# Patient Record
Sex: Male | Born: 1965 | Race: White | Hispanic: No | Marital: Single | Smoking: Current every day smoker
Health system: Southern US, Community
[De-identification: ages and names within clinical notes are randomized; demographics above are authoritative.]

## PROBLEM LIST (undated history)

## (undated) DIAGNOSIS — M1990 Unspecified osteoarthritis, unspecified site: Secondary | ICD-10-CM

## (undated) DIAGNOSIS — E46 Unspecified protein-calorie malnutrition: Secondary | ICD-10-CM

## (undated) DIAGNOSIS — G8929 Other chronic pain: Secondary | ICD-10-CM

## (undated) DIAGNOSIS — G629 Polyneuropathy, unspecified: Secondary | ICD-10-CM

## (undated) DIAGNOSIS — Z91148 Patient's other noncompliance with medication regimen for other reason: Secondary | ICD-10-CM

## (undated) DIAGNOSIS — R52 Pain, unspecified: Secondary | ICD-10-CM

## (undated) DIAGNOSIS — E079 Disorder of thyroid, unspecified: Secondary | ICD-10-CM

## (undated) DIAGNOSIS — E1110 Type 2 diabetes mellitus with ketoacidosis without coma: Secondary | ICD-10-CM

## (undated) DIAGNOSIS — Z9114 Patient's other noncompliance with medication regimen: Secondary | ICD-10-CM

---

## 2004-02-17 ENCOUNTER — Ambulatory Visit: Payer: Self-pay | Admitting: Orthopedic Surgery

## 2004-02-25 ENCOUNTER — Ambulatory Visit (HOSPITAL_COMMUNITY): Admission: RE | Admit: 2004-02-25 | Discharge: 2004-02-25 | Payer: Self-pay | Admitting: Orthopedic Surgery

## 2007-10-10 ENCOUNTER — Ambulatory Visit (HOSPITAL_COMMUNITY): Admission: RE | Admit: 2007-10-10 | Discharge: 2007-10-10 | Payer: Self-pay | Admitting: Family Medicine

## 2010-04-11 ENCOUNTER — Encounter: Payer: Self-pay | Admitting: Orthopedic Surgery

## 2011-03-16 ENCOUNTER — Encounter: Payer: Self-pay | Admitting: *Deleted

## 2011-03-16 ENCOUNTER — Emergency Department (HOSPITAL_COMMUNITY)
Admission: EM | Admit: 2011-03-16 | Discharge: 2011-03-16 | Disposition: A | Discharge status: Home / Self Care | Payer: Self-pay | Attending: Emergency Medicine | Admitting: Emergency Medicine

## 2011-03-16 ENCOUNTER — Emergency Department (HOSPITAL_COMMUNITY): Payer: Self-pay

## 2011-03-16 DIAGNOSIS — Z800 Family history of malignant neoplasm of digestive organs: Secondary | ICD-10-CM | POA: Insufficient documentation

## 2011-03-16 DIAGNOSIS — E860 Dehydration: Secondary | ICD-10-CM | POA: Insufficient documentation

## 2011-03-16 DIAGNOSIS — Z801 Family history of malignant neoplasm of trachea, bronchus and lung: Secondary | ICD-10-CM | POA: Insufficient documentation

## 2011-03-16 DIAGNOSIS — K5900 Constipation, unspecified: Secondary | ICD-10-CM | POA: Insufficient documentation

## 2011-03-16 DIAGNOSIS — R739 Hyperglycemia, unspecified: Secondary | ICD-10-CM

## 2011-03-16 DIAGNOSIS — R05 Cough: Secondary | ICD-10-CM | POA: Insufficient documentation

## 2011-03-16 DIAGNOSIS — R059 Cough, unspecified: Secondary | ICD-10-CM | POA: Insufficient documentation

## 2011-03-16 DIAGNOSIS — E119 Type 2 diabetes mellitus without complications: Secondary | ICD-10-CM | POA: Insufficient documentation

## 2011-03-16 DIAGNOSIS — H539 Unspecified visual disturbance: Secondary | ICD-10-CM | POA: Insufficient documentation

## 2011-03-16 DIAGNOSIS — R209 Unspecified disturbances of skin sensation: Secondary | ICD-10-CM | POA: Insufficient documentation

## 2011-03-16 DIAGNOSIS — K649 Unspecified hemorrhoids: Secondary | ICD-10-CM | POA: Insufficient documentation

## 2011-03-16 DIAGNOSIS — F17200 Nicotine dependence, unspecified, uncomplicated: Secondary | ICD-10-CM | POA: Insufficient documentation

## 2011-03-16 HISTORY — DX: Unspecified osteoarthritis, unspecified site: M19.90

## 2011-03-16 LAB — COMPREHENSIVE METABOLIC PANEL
ALT: 8 U/L (ref 0–53)
AST: 10 U/L (ref 0–37)
Alkaline Phosphatase: 56 U/L (ref 39–117)
BUN: 16 mg/dL (ref 6–23)
CO2: 27 mEq/L (ref 19–32)
Chloride: 101 mEq/L (ref 96–112)
GFR calc Af Amer: >90 mL/min (ref >90)
GFR calc non Af Amer: 88 mL/min — ABNORMAL LOW (ref >90)
Glucose, Bld: 281 mg/dL — ABNORMAL HIGH (ref 70–99)
Potassium: 3.8 mEq/L (ref 3.5–5.1)
Sodium: 138 mEq/L (ref 135–145)
Total Bilirubin: 0.3 mg/dL (ref 0.3–1.2)

## 2011-03-16 LAB — URINE MICROSCOPIC-ADD ON

## 2011-03-16 LAB — CBC
HCT: 45.6 % (ref 39.0–52.0)
Hemoglobin: 15.8 g/dL (ref 13.0–17.0)
MCH: 31.2 pg (ref 26.0–34.0)
MCHC: 34.6 g/dL (ref 30.0–36.0)
MCV: 89.9 fL (ref 78.0–100.0)
Platelets: 163 10*3/uL (ref 150–400)
RBC: 5.07 MIL/uL (ref 4.22–5.81)
RDW: 12.4 % (ref 11.5–15.5)
WBC: 10.2 10*3/uL (ref 4.0–10.5)

## 2011-03-16 LAB — URINALYSIS, ROUTINE W REFLEX MICROSCOPIC
Bilirubin Urine: NEGATIVE
Leukocytes, UA: NEGATIVE
Nitrite: NEGATIVE
Specific Gravity, Urine: >1.03 — ABNORMAL HIGH (ref 1.005–1.030)
Urobilinogen, UA: 0.2 mg/dL (ref 0.0–1.0)
pH: 6 (ref 5.0–8.0)

## 2011-03-16 LAB — DIFFERENTIAL
Basophils Absolute: 0 10*3/uL (ref 0.0–0.1)
Basophils Relative: 0 % (ref 0–1)
Lymphocytes Relative: 26 % (ref 12–46)
Lymphs Abs: 2.6 10*3/uL (ref 0.7–4.0)
Monocytes Absolute: 1.1 10*3/uL — ABNORMAL HIGH (ref 0.1–1.0)
Monocytes Relative: 11 % (ref 3–12)
Neutro Abs: 6 10*3/uL (ref 1.7–7.7)
Neutrophils Relative %: 59 % (ref 43–77)

## 2011-03-16 LAB — GLUCOSE, CAPILLARY: Glucose-Capillary: 231 mg/dL — ABNORMAL HIGH (ref 70–99)

## 2011-03-16 MED ORDER — HYDROCORTISONE ACETATE 25 MG RE SUPP: 25.0000 mg | Freq: Two times a day (BID) | RECTAL | Status: AC

## 2011-03-16 MED ORDER — SODIUM CHLORIDE 0.9 % IV SOLN
INTRAVENOUS | Status: DC
  Administered 2011-03-16: 22:00:00 via INTRAVENOUS

## 2011-03-16 MED ORDER — SODIUM CHLORIDE 0.9 % IV BOLUS (SEPSIS)
1000.0000 mL | Freq: Once | INTRAVENOUS | Status: AC
  Administered 2011-03-16: 1000 mL via INTRAVENOUS

## 2011-03-16 NOTE — ED Notes (Signed)
BGL 231

## 2011-03-16 NOTE — ED Notes (Signed)
Pt reports a numbness to the left ring & pinky finger. States blood sugars have been 300+ twice today. Pt took meds 1 hour ago.

## 2011-03-16 NOTE — ED Provider Notes (Signed)
History   This chart was scribed for Ward Givens, MD scribed by Magnus Sinning. The patient was seen in room APA08/APA08    CSN: [id number]  Arrival date & time 03/16/11  2010   First MD Initiated Contact with Patient 03/16/11 2054      Chief Complaint  Patient presents with  . Hyperglycemia    (Consider location/radiation/quality/duration/timing/severity/associated sxs/prior treatment) HPI Kenneth [name] is a 45 y.o. male who presents to the Emergency Department complaining of hyperglycemia, onset a couple days ago, with associated intermittent numbness, onset 11:00 AM today. Pt also reports associated blurred vision, and a "shooting pain from his neck to back." Pt states that numbness is currently resolved, but that his blood sugar has fluctuated between 232 and 356. He furthers that over the last couple of days it has been high and that at baseline it is typically between 100-140.  He says that typically his hyperglycemia is relieved with medications, but that it was not improved with meds today. Pt states that he takes glucophage for DM. He attributes recent hyperglycemia to decreased exercise resulting from lack of work.  Pt denies any headache, and n/v, but does report that he has been experiencing some constipation, beginning 7 days ago, rectal pain following his last BM a few days ago, slight trace of bleeding, and frequent urination that has occurred over the last month without nocturia. PT states "I just don't feel right".  He has also had a cough which is slightly worse then his smokers cough. Pt states his been diabetic for about 2 years  Hx of hemmorhoids  . PCP: [facility]  Past Medical History  Diagnosis Date  . Diabetes mellitus   . Arthritis     History reviewed. No pertinent past surgical history.  No family history on file. Family history of DM  In FOP and Sister and CA in both mother (metastatic lung cancer) and FOP (pancreatic  cancer).  History  Substance Use Topics  . Smoking status: Current Some Day Smoker  . Smokeless tobacco: Not on file  . Alcohol Use:   Current Smoker Drank about 5 shots of brandy last night [employer] investment [employer]    Review of Systems  Constitutional:       High blood sugar  Eyes: Positive for visual disturbance (Blurry vision).  Musculoskeletal:       Shooting pain from neck to back  Neurological: Positive for numbness.  All other systems reviewed and are negative.    Allergies  Review of patient's allergies indicates no known allergies.  Home Medications   Previous Medications   IBUPROFEN (ADVIL,MOTRIN) 200 MG TABLET    Take 200 mg by mouth as needed. For pain    METFORMIN (GLUCOPHAGE) 1000 MG TABLET    Take 1,000 mg by mouth 2 (two) times daily with a meal.        BP 120/70  Pulse 75  Temp 98.3 F (36.8 C)  Resp 20  Ht 5\' 9"  (1.753 m)  Wt 168 lb (76.204 kg)  BMI 24.81 kg/m2  SpO2 97%  Vital signs normal    Physical Exam  Nursing note and vitals reviewed. Constitutional: He is oriented to person, place, and time. He appears well-developed and well-nourished. No distress.  HENT:  Head: Normocephalic and atraumatic.  Eyes: EOM are normal. Pupils are equal, round, and reactive to light.  Neck: Neck supple. No tracheal deviation present.  Cardiovascular: Normal rate, regular rhythm and normal heart sounds.  Pulmonary/Chest: Effort normal. No respiratory distress.  Abdominal: Soft. Bowel sounds are normal. He exhibits no distension.  Genitourinary:       Chaperone present. Physician checked rectum for hemorrhoids. Physician notes one hemorrhoid from 12 o'clock to 6 o'clock and it is not inflamed or bleeding.    Musculoskeletal: Normal range of motion. He exhibits no edema.  Neurological: He is alert and oriented to person, place, and time. No sensory deficit.  Skin: Skin is warm and dry.  Psychiatric: He has a normal mood and affect. His behavior is normal.     ED Course  Procedures (including critical care time) DIAGNOSTIC STUDIES: Oxygen Saturation is 97% on room air, normal by my interpretation.    COORDINATION OF CARE: 10:35 PM-Physician reviewed and explained imaging and lab results with patient and answered questions. Patient reports he is feeling better.  Pt given IV fluids and he feels better.   Results for orders placed during the hospital encounter of 03/16/11  GLUCOSE, CAPILLARY      Component Value Range   Glucose-Capillary 231 (*) 70 - 99 (mg/dL)  COMPREHENSIVE METABOLIC PANEL      Component Value Range   Sodium 138  135 - 145 (mEq/L)   Potassium 3.8  3.5 - 5.1 (mEq/L)   Chloride 101  96 - 112 (mEq/L)   CO2 27  19 - 32 (mEq/L)   Glucose, Bld 281 (*) 70 - 99 (mg/dL)   BUN 16  6 - 23 (mg/dL)   Creatinine, Ser 1.61  0.50 - 1.35 (mg/dL)   Calcium 9.4  8.4 - 09.6 (mg/dL)   Total Protein 6.6  6.0 - 8.3 (g/dL)   Albumin 3.7  3.5 - 5.2 (g/dL)   AST 10  0 - 37 (U/L)   ALT 8  0 - 53 (U/L)   Alkaline Phosphatase 56  39 - 117 (U/L)   Total Bilirubin 0.3  0.3 - 1.2 (mg/dL)   GFR calc non Af Amer 88 (*) >90 (mL/min)   GFR calc Af Amer >90  >90 (mL/min)  CBC      Component Value Range   WBC 10.2  4.0 - 10.5 (K/uL)   RBC 5.07  4.22 - 5.81 (MIL/uL)   Hemoglobin 15.8  13.0 - 17.0 (g/dL)   HCT 04.5  40.9 - 81.1 (%)   MCV 89.9  78.0 - 100.0 (fL)   MCH 31.2  26.0 - 34.0 (pg)   MCHC 34.6  30.0 - 36.0 (g/dL)   RDW 91.4  78.2 - 95.6 (%)   Platelets 163  150 - 400 (K/uL)  DIFFERENTIAL      Component Value Range   Neutrophils Relative 59  43 - 77 (%)   Neutro Abs 6.0  1.7 - 7.7 (K/uL)   Lymphocytes Relative 26  12 - 46 (%)   Lymphs Abs 2.6  0.7 - 4.0 (K/uL)   Monocytes Relative 11  3 - 12 (%)   Monocytes Absolute 1.1 (*) 0.1 - 1.0 (K/uL)   Eosinophils Relative 5  0 - 5 (%)   Eosinophils Absolute 0.5  0.0 - 0.7 (K/uL)   Basophils Relative 0  0 - 1 (%)   Basophils Absolute 0.0  0.0 - 0.1 (K/uL)  URINALYSIS, ROUTINE W REFLEX  MICROSCOPIC      Component Value Range   Color, Urine YELLOW  YELLOW    APPearance CLEAR  CLEAR    Specific Gravity, Urine >1.030 (*) 1.005 - 1.030    pH 6.0  5.0 -  8.0    Glucose, UA >1000 (*) NEGATIVE (mg/dL)   Hgb urine dipstick NEGATIVE  NEGATIVE    Bilirubin Urine NEGATIVE  NEGATIVE    Ketones, ur TRACE (*) NEGATIVE (mg/dL)   Protein, ur NEGATIVE  NEGATIVE (mg/dL)   Urobilinogen, UA 0.2  0.0 - 1.0 (mg/dL)   Nitrite NEGATIVE  NEGATIVE    Leukocytes, UA NEGATIVE  NEGATIVE   URINE MICROSCOPIC-ADD ON      Component Value Range   Squamous Epithelial / LPF RARE  RARE    WBC, UA 0-2  <3 (WBC/hpf)   RBC / HPF 0-2  <3 (RBC/hpf)   Bacteria, UA FEW (*) RARE    Laboratory interpretation all normal except concentrated urine c/w dehydration, hyperglycemia   Dg Chest 2 View  03/16/2011  *RADIOLOGY REPORT*  Clinical Data: Cough and weakness  CHEST - 2 VIEW  Comparison: Chest radiograph 12/09/2004.  Findings: Normal mediastinum and cardiac silhouette.  Normal pulmonary  vasculature.  No evidence of effusion, infiltrate, or pneumothorax.  No acute bony abnormality.   IMPRESSION:  1.  No acute cardiopulmonary process.  Original Report Authenticated By: Genevive Bi, M.D.   Dg Abd 2 Views  03/16/2011  *RADIOLOGY REPORT*  Clinical Data: Left hand numbness, weakness, constipation  ABDOMEN - 2 VIEW  Comparison: None  Findings: No dilated loops of large or small bowel.  No free air beneath hemidiaphragms.  There is gas and stool rectosigmoid colon. Moderate volume stool throughout the ascending, transverse and descending colon.  No pathologic calcifications.  IMPRESSION:  1.  No evidence of bowel obstruction or intraperitoneal free air. 2.  Moderate volume stool could represent constipation.  Original Report Authenticated By: Genevive Bi, M.D.   Diagnoses that have been ruled out:  Diagnoses that are still under consideration:  Final diagnoses:  Hyperglycemia  Hemorrhoids  Constipation    New Prescriptions   HYDROCORTISONE (ANUSOL-HC) 25 MG SUPPOSITORY    Place 1 suppository (25 mg total) rectally 2 (two) times daily.    Plan discharge  MDM   I personally performed the services described in this documentation, which was scribed in my presence. The recorded information has been reviewed and considered. Devoria Albe, MD, Armando Gang          Ward Givens, MD 03/16/11 [phone]

## 2011-03-16 NOTE — ED Notes (Signed)
Pt given discharge instructions, paperwork & prescription(s), pt verbalized understanding.   

## 2011-03-16 NOTE — ED Notes (Signed)
States his blood sugar has been elevated for 2 days, has intermittent numbness in left ring and pinky finger onset today.

## 2012-06-29 ENCOUNTER — Encounter (HOSPITAL_COMMUNITY): Payer: Self-pay

## 2012-06-29 ENCOUNTER — Emergency Department (HOSPITAL_COMMUNITY)
Admission: EM | Admit: 2012-06-29 | Discharge: 2012-06-29 | Disposition: A | Discharge status: Home / Self Care | Payer: Self-pay | Attending: Emergency Medicine | Admitting: Emergency Medicine

## 2012-06-29 ENCOUNTER — Emergency Department (HOSPITAL_COMMUNITY): Payer: Self-pay

## 2012-06-29 DIAGNOSIS — Z862 Personal history of diseases of the blood and blood-forming organs and certain disorders involving the immune mechanism: Secondary | ICD-10-CM | POA: Insufficient documentation

## 2012-06-29 DIAGNOSIS — M545 Low back pain, unspecified: Secondary | ICD-10-CM | POA: Insufficient documentation

## 2012-06-29 DIAGNOSIS — Z79899 Other long term (current) drug therapy: Secondary | ICD-10-CM | POA: Insufficient documentation

## 2012-06-29 DIAGNOSIS — F17200 Nicotine dependence, unspecified, uncomplicated: Secondary | ICD-10-CM | POA: Insufficient documentation

## 2012-06-29 DIAGNOSIS — M549 Dorsalgia, unspecified: Secondary | ICD-10-CM

## 2012-06-29 DIAGNOSIS — E1169 Type 2 diabetes mellitus with other specified complication: Secondary | ICD-10-CM | POA: Insufficient documentation

## 2012-06-29 DIAGNOSIS — Z794 Long term (current) use of insulin: Secondary | ICD-10-CM | POA: Insufficient documentation

## 2012-06-29 DIAGNOSIS — M129 Arthropathy, unspecified: Secondary | ICD-10-CM | POA: Insufficient documentation

## 2012-06-29 DIAGNOSIS — Z8639 Personal history of other endocrine, nutritional and metabolic disease: Secondary | ICD-10-CM | POA: Insufficient documentation

## 2012-06-29 DIAGNOSIS — E119 Type 2 diabetes mellitus without complications: Secondary | ICD-10-CM

## 2012-06-29 HISTORY — DX: Disorder of thyroid, unspecified: E07.9

## 2012-06-29 LAB — CBC WITH DIFFERENTIAL/PLATELET
Basophils Absolute: 0.1 K/uL (ref 0.0–0.1)
Basophils Relative: 1 % (ref 0–1)
Eosinophils Absolute: 0.2 K/uL (ref 0.0–0.7)
Eosinophils Relative: 4 % (ref 0–5)
HCT: 42.4 % (ref 39.0–52.0)
Hemoglobin: 15.4 g/dL (ref 13.0–17.0)
Lymphocytes Relative: 24 % (ref 12–46)
Lymphs Abs: 1.3 K/uL (ref 0.7–4.0)
MCH: 32.3 pg (ref 26.0–34.0)
MCHC: 36.3 g/dL — ABNORMAL HIGH (ref 30.0–36.0)
MCV: 88.9 fL (ref 78.0–100.0)
Monocytes Absolute: 0.6 K/uL (ref 0.1–1.0)
Monocytes Relative: 10 % (ref 3–12)
Neutro Abs: 3.4 K/uL (ref 1.7–7.7)
Neutrophils Relative %: 61 % (ref 43–77)
Platelets: 172 K/uL (ref 150–400)
RBC: 4.77 MIL/uL (ref 4.22–5.81)
RDW: 12 % (ref 11.5–15.5)
WBC: 5.5 K/uL (ref 4.0–10.5)

## 2012-06-29 LAB — URINALYSIS, ROUTINE W REFLEX MICROSCOPIC
Bilirubin Urine: NEGATIVE
Hgb urine dipstick: NEGATIVE
Ketones, ur: NEGATIVE mg/dL
Leukocytes, UA: NEGATIVE
Nitrite: NEGATIVE
Protein, ur: NEGATIVE mg/dL
Specific Gravity, Urine: 1.01 (ref 1.005–1.030)
Urobilinogen, UA: 0.2 mg/dL (ref 0.0–1.0)

## 2012-06-29 LAB — BASIC METABOLIC PANEL WITH GFR
BUN: 11 mg/dL (ref 6–23)
CO2: 27 meq/L (ref 19–32)
Calcium: 9.4 mg/dL (ref 8.4–10.5)
Chloride: 94 meq/L — ABNORMAL LOW (ref 96–112)
Creatinine, Ser: 0.64 mg/dL (ref 0.50–1.35)
GFR calc Af Amer: >90 mL/min
GFR calc non Af Amer: >90 mL/min
Glucose, Bld: 305 mg/dL — ABNORMAL HIGH (ref 70–99)
Potassium: 3.9 meq/L (ref 3.5–5.1)
Sodium: 132 meq/L — ABNORMAL LOW (ref 135–145)

## 2012-06-29 LAB — GLUCOSE, CAPILLARY: Glucose-Capillary: 293 mg/dL — ABNORMAL HIGH (ref 70–99)

## 2012-06-29 LAB — URINE MICROSCOPIC-ADD ON

## 2012-06-29 MED ORDER — SODIUM CHLORIDE 0.9 % IV BOLUS (SEPSIS)
1000.0000 mL | Freq: Once | INTRAVENOUS | Status: AC
  Administered 2012-06-29: 1000 mL via INTRAVENOUS

## 2012-06-29 MED ORDER — ONDANSETRON HCL 4 MG/2ML IJ SOLN
4.0000 mg | Freq: Once | INTRAMUSCULAR | Status: AC
  Administered 2012-06-29: 4 mg via INTRAVENOUS
  Filled 2012-06-29: qty 2
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
## 2012-06-29 NOTE — ED Notes (Signed)
Pt states he blood sugar has been running high for a long time. States he goes to the health dept for care. Denies n/v/d. States he is hurting all over

## 2012-06-29 NOTE — ED Provider Notes (Signed)
History    This chart was scribed for Donnetta Hutching, MD by Sofie Rower, ED Scribe. The patient was seen in room APA16A/APA16A and the patient's care was started at 11:39AM.    CSN: [id number]  Arrival date & time 06/29/12  1059   First MD Initiated Contact with Patient 06/29/12 1139      Chief Complaint  Patient presents with  . Hyperglycemia    (Consider location/radiation/quality/duration/timing/severity/associated sxs/prior treatment) The history is provided by the patient. No language interpreter was used.     Kenneth [name] is a 47 y.o. male , with a hx of diabetes mellitus, arthritis, and thyroid disease (hyperthyroidism), who presents to the Emergency Department complaining of gradual, progressively worsening, hyperglycemia, onset three months ago. The pt reports he has been experiencing elevated blood sugar, anywhere from 300-500, for the past few months. Furthermore, the pt informs he has been eating well, maintaining his weight, and taking his regularly scheduled metformin, however, it does not provide relief of his hyperglycemia. Additionally, the pt presents to the Emergency Department complaining of gradual, progressively worsening, back pain located at the lumbar region, radiating downwards to the right lower extremity, onset 6 months ago. The pt reports his radiating back pain is intensified by certain movements and positions in addition to ambulation.  The pt is a current some day smoker, however, he does not drink alcohol.   [facility]   Past Medical History  Diagnosis Date  . Diabetes mellitus   . Arthritis   . Thyroid disease     History reviewed. No pertinent past surgical history.  No family history on file.  History  Substance Use Topics  . Smoking status: Current Some Day Smoker  . Smokeless tobacco: Not on file  . Alcohol Use: No      Review of Systems  Constitutional: Negative for unexpected weight change.  Musculoskeletal:  Positive for back pain.    10 Systems reviewed and all are negative for acute change except as noted in the HPI.    Allergies  Review of patient's allergies indicates no known allergies.  Home Medications   Current Outpatient Rx  Name  Route  Sig  Dispense  Refill  . ibuprofen (ADVIL,MOTRIN) 200 MG tablet   Oral   Take 200 mg by mouth as needed. For pain          . metFORMIN (GLUCOPHAGE) 1000 MG tablet   Oral   Take 1,000 mg by mouth 2 (two) times daily with a meal.             BP 110/71  Pulse 71  Temp(Src) 97.7 F (36.5 C) (Oral)  Resp 16  Ht 5\' 9"  (1.753 m)  Wt 140 lb (63.504 kg)  BMI 20.67 kg/m2  SpO2 100%  Physical Exam  Nursing note and vitals reviewed. Constitutional: He is oriented to person, place, and time. He appears well-developed and well-nourished.  Pt is thin.   HENT:  Head: Normocephalic and atraumatic.  Eyes: Conjunctivae and EOM are normal. Pupils are equal, round, and reactive to light.  Neck: Normal range of motion. Neck supple.  Cardiovascular: Normal rate, regular rhythm and normal heart sounds.   Pulmonary/Chest: Effort normal and breath sounds normal.  Abdominal: Soft. Bowel sounds are normal.  Musculoskeletal: Normal range of motion.  Neurological: He is alert and oriented to person, place, and time.  Skin: Skin is warm and dry.  Psychiatric: He has a normal mood and affect.  ED Course  Procedures (including critical care time)  DIAGNOSTIC STUDIES: Oxygen Saturation is 100% on room air, normal by my interpretation.    COORDINATION OF CARE:   12:02 PM- Treatment plan discussed with patient. Pt agrees with treatment.    Results for orders placed during the hospital encounter of 06/29/12  CBC WITH DIFFERENTIAL      Result Value Range   WBC 5.5  4.0 - 10.5 K/uL   RBC 4.77  4.22 - 5.81 MIL/uL   Hemoglobin 15.4  13.0 - 17.0 g/dL   HCT 16.1  09.6 - 04.5 %   MCV 88.9  78.0 - 100.0 fL   MCH 32.3  26.0 - 34.0 pg   MCHC 36.3  (*) 30.0 - 36.0 g/dL   RDW 40.9  81.1 - 91.4 %   Platelets 172  150 - 400 K/uL   Neutrophils Relative 61  43 - 77 %   Neutro Abs 3.4  1.7 - 7.7 K/uL   Lymphocytes Relative 24  12 - 46 %   Lymphs Abs 1.3  0.7 - 4.0 K/uL   Monocytes Relative 10  3 - 12 %   Monocytes Absolute 0.6  0.1 - 1.0 K/uL   Eosinophils Relative 4  0 - 5 %   Eosinophils Absolute 0.2  0.0 - 0.7 K/uL   Basophils Relative 1  0 - 1 %   Basophils Absolute 0.1  0.0 - 0.1 K/uL  GLUCOSE, CAPILLARY      Result Value Range   Glucose-Capillary 293 (*) 70 - 99 mg/dL       No results found.   No diagnosis found.    MDM  Patient has long-standing poorly controlled diabetes. He is not in DKA.  He has primary care followup your      I personally performed the services described in this documentation, which was scribed in my presence. The recorded information has been reviewed and is accurate.    Donnetta Hutching, MD 06/29/12 1510

## 2012-09-16 ENCOUNTER — Emergency Department (HOSPITAL_COMMUNITY): Payer: Self-pay

## 2012-09-16 ENCOUNTER — Encounter (HOSPITAL_COMMUNITY): Payer: Self-pay | Admitting: *Deleted

## 2012-09-16 ENCOUNTER — Emergency Department (HOSPITAL_COMMUNITY)
Admission: EM | Admit: 2012-09-16 | Discharge: 2012-09-16 | Disposition: A | Discharge status: Home / Self Care | Payer: Self-pay | Attending: Emergency Medicine | Admitting: Emergency Medicine

## 2012-09-16 DIAGNOSIS — Z8639 Personal history of other endocrine, nutritional and metabolic disease: Secondary | ICD-10-CM | POA: Insufficient documentation

## 2012-09-16 DIAGNOSIS — Z79899 Other long term (current) drug therapy: Secondary | ICD-10-CM | POA: Insufficient documentation

## 2012-09-16 DIAGNOSIS — S4980XA Other specified injuries of shoulder and upper arm, unspecified arm, initial encounter: Secondary | ICD-10-CM | POA: Insufficient documentation

## 2012-09-16 DIAGNOSIS — S2095XA Superficial foreign body of unspecified parts of thorax, initial encounter: Secondary | ICD-10-CM | POA: Insufficient documentation

## 2012-09-16 DIAGNOSIS — S46909A Unspecified injury of unspecified muscle, fascia and tendon at shoulder and upper arm level, unspecified arm, initial encounter: Secondary | ICD-10-CM | POA: Insufficient documentation

## 2012-09-16 DIAGNOSIS — Y9339 Activity, other involving climbing, rappelling and jumping off: Secondary | ICD-10-CM | POA: Insufficient documentation

## 2012-09-16 DIAGNOSIS — S20212A Contusion of left front wall of thorax, initial encounter: Secondary | ICD-10-CM

## 2012-09-16 DIAGNOSIS — M129 Arthropathy, unspecified: Secondary | ICD-10-CM | POA: Insufficient documentation

## 2012-09-16 DIAGNOSIS — Z862 Personal history of diseases of the blood and blood-forming organs and certain disorders involving the immune mechanism: Secondary | ICD-10-CM | POA: Insufficient documentation

## 2012-09-16 DIAGNOSIS — S20219A Contusion of unspecified front wall of thorax, initial encounter: Secondary | ICD-10-CM | POA: Insufficient documentation

## 2012-09-16 DIAGNOSIS — IMO0002 Reserved for concepts with insufficient information to code with codable children: Secondary | ICD-10-CM | POA: Insufficient documentation

## 2012-09-16 DIAGNOSIS — Z794 Long term (current) use of insulin: Secondary | ICD-10-CM | POA: Insufficient documentation

## 2012-09-16 DIAGNOSIS — R109 Unspecified abdominal pain: Secondary | ICD-10-CM

## 2012-09-16 DIAGNOSIS — Y92410 Unspecified street and highway as the place of occurrence of the external cause: Secondary | ICD-10-CM | POA: Insufficient documentation

## 2012-09-16 DIAGNOSIS — F17200 Nicotine dependence, unspecified, uncomplicated: Secondary | ICD-10-CM | POA: Insufficient documentation

## 2012-09-16 DIAGNOSIS — W460XXA Contact with hypodermic needle, initial encounter: Secondary | ICD-10-CM | POA: Insufficient documentation

## 2012-09-16 MED ORDER — IBUPROFEN 400 MG PO TABS
400.0000 mg | ORAL_TABLET | Freq: Once | ORAL | Status: AC
  Administered 2012-09-16: 400 mg via ORAL
  Filled 2012-09-16: qty 1

## 2012-09-16 NOTE — ED Notes (Signed)
Pt alert & oriented x4, stable gait. Patient given discharge instructions, paperwork & prescription(s). Patient  instructed to stop at the registration desk to finish any additional paperwork. Patient verbalized understanding. Pt left department w/ no further questions. 

## 2012-09-16 NOTE — ED Notes (Signed)
Pt broke needle off insulin in his abdomen. Pt also c/o left shoulder pain.
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
## 2012-09-16 NOTE — ED Notes (Signed)
Pt reports that he my have broken a needle off in his abdomen tonight when giving himself insulin shot. Pt also reports left shoulder pain after a fall 2 days ago,

## 2013-06-14 ENCOUNTER — Encounter (HOSPITAL_COMMUNITY): Payer: Self-pay | Admitting: Emergency Medicine

## 2013-06-14 ENCOUNTER — Emergency Department (HOSPITAL_COMMUNITY)
Admission: EM | Admit: 2013-06-14 | Discharge: 2013-06-14 | Disposition: A | Discharge status: Home / Self Care | Payer: 59 | Attending: Emergency Medicine | Admitting: Emergency Medicine

## 2013-06-14 ENCOUNTER — Emergency Department (HOSPITAL_COMMUNITY): Payer: 59

## 2013-06-14 DIAGNOSIS — S8000XA Contusion of unspecified knee, initial encounter: Secondary | ICD-10-CM | POA: Insufficient documentation

## 2013-06-14 DIAGNOSIS — S8002XA Contusion of left knee, initial encounter: Secondary | ICD-10-CM

## 2013-06-14 DIAGNOSIS — Z794 Long term (current) use of insulin: Secondary | ICD-10-CM | POA: Insufficient documentation

## 2013-06-14 DIAGNOSIS — Z79899 Other long term (current) drug therapy: Secondary | ICD-10-CM | POA: Insufficient documentation

## 2013-06-14 DIAGNOSIS — S0101XA Laceration without foreign body of scalp, initial encounter: Secondary | ICD-10-CM

## 2013-06-14 DIAGNOSIS — E119 Type 2 diabetes mellitus without complications: Secondary | ICD-10-CM | POA: Insufficient documentation

## 2013-06-14 DIAGNOSIS — S0093XA Contusion of unspecified part of head, initial encounter: Secondary | ICD-10-CM

## 2013-06-14 DIAGNOSIS — S3981XA Other specified injuries of abdomen, initial encounter: Secondary | ICD-10-CM | POA: Insufficient documentation

## 2013-06-14 DIAGNOSIS — S0100XA Unspecified open wound of scalp, initial encounter: Secondary | ICD-10-CM | POA: Insufficient documentation

## 2013-06-14 DIAGNOSIS — Z8739 Personal history of other diseases of the musculoskeletal system and connective tissue: Secondary | ICD-10-CM | POA: Insufficient documentation

## 2013-06-14 DIAGNOSIS — S0081XA Abrasion of other part of head, initial encounter: Secondary | ICD-10-CM

## 2013-06-14 DIAGNOSIS — S0083XA Contusion of other part of head, initial encounter: Secondary | ICD-10-CM

## 2013-06-14 DIAGNOSIS — S5010XA Contusion of unspecified forearm, initial encounter: Secondary | ICD-10-CM | POA: Insufficient documentation

## 2013-06-14 DIAGNOSIS — S5012XA Contusion of left forearm, initial encounter: Secondary | ICD-10-CM

## 2013-06-14 DIAGNOSIS — IMO0002 Reserved for concepts with insufficient information to code with codable children: Secondary | ICD-10-CM | POA: Insufficient documentation

## 2013-06-14 DIAGNOSIS — S0003XA Contusion of scalp, initial encounter: Secondary | ICD-10-CM | POA: Insufficient documentation

## 2013-06-14 DIAGNOSIS — S1093XA Contusion of unspecified part of neck, initial encounter: Secondary | ICD-10-CM

## 2013-06-14 DIAGNOSIS — S60229A Contusion of unspecified hand, initial encounter: Secondary | ICD-10-CM | POA: Insufficient documentation

## 2013-06-14 DIAGNOSIS — S60222A Contusion of left hand, initial encounter: Secondary | ICD-10-CM

## 2013-06-14 DIAGNOSIS — F17200 Nicotine dependence, unspecified, uncomplicated: Secondary | ICD-10-CM | POA: Insufficient documentation

## 2013-06-14 MED ORDER — LIDOCAINE-EPINEPHRINE (PF) 2 %-1:200000 IJ SOLN
INTRAMUSCULAR | Status: AC
  Administered 2013-06-14: 16:00:00
  Filled 2013-06-14: qty 20

## 2013-06-14 MED ORDER — OXYCODONE-ACETAMINOPHEN 5-325 MG PO TABS: 1.0000 | ORAL_TABLET | Freq: Four times a day (QID) | ORAL | Status: DC | PRN

## 2013-06-14 MED ORDER — OXYCODONE-ACETAMINOPHEN 5-325 MG PO TABS
1.0000 | ORAL_TABLET | Freq: Once | ORAL | Status: AC
  Administered 2013-06-14: 1 via ORAL
  Filled 2013-06-14: qty 1

## 2013-06-14 NOTE — Discharge Instructions (Signed)
Contusion A contusion is a deep bruise. Contusions are the result of an injury that caused bleeding under the skin. The contusion may turn blue, purple, or yellow. Minor injuries will give you a painless contusion, but more severe contusions may stay painful and swollen for a few weeks.  CAUSES  A contusion is usually caused by a blow, trauma, or direct force to an area of the body. SYMPTOMS   Swelling and redness of the injured area.  Bruising of the injured area.  Tenderness and soreness of the injured area.  Pain. DIAGNOSIS  The diagnosis can be made by taking a history and physical exam. An X-ray, CT scan, or MRI may be needed to determine if there were any associated injuries, such as fractures. TREATMENT  Specific treatment will depend on what area of the body was injured. In general, the best treatment for a contusion is resting, icing, elevating, and applying cold compresses to the injured area. Over-the-counter medicines may also be recommended for pain control. Ask your caregiver what the best treatment is for your contusion. HOME CARE INSTRUCTIONS   Put ice on the injured area.  Put ice in a plastic bag.  Place a towel between your skin and the bag.  Leave the ice on for 15-20 minutes, 03-04 times a day.  Only take over-the-counter or prescription medicines for pain, discomfort, or fever as directed by your caregiver. Your caregiver may recommend avoiding anti-inflammatory medicines (aspirin, ibuprofen, and naproxen) for 48 hours because these medicines may increase bruising.  Rest the injured area.  If possible, elevate the injured area to reduce swelling. SEEK IMMEDIATE MEDICAL CARE IF:   You have increased bruising or swelling.  You have pain that is getting worse.  Your swelling or pain is not relieved with medicines. MAKE SURE YOU:   Understand these instructions.  Will watch your condition.  Will get help right away if you are not doing well or get  worse. Document Released: 12/16/2004 Document Revised: 05/31/2011 Document Reviewed: 01/11/2011 [facility] Patient Information 2014 [place], [place].  Staple Wound Closure Staples are used to help a wound heal faster by holding the edges of the wound together. HOME CARE  Keep the area around the staples clean and dry.  Rest and raise (elevate) the injured part above the level of your heart.  See your doctor for a follow-up check of the wound.  See your doctor to have the staples removed.  Clean the wound daily with water.  Do not soak the wound in water for long periods of time.  Let air reach the wound as it heals. GET HELP RIGHT AWAY IF:   You have redness or puffiness around the wound.  You have a red line going away from the wound.  You have more pain or tenderness.  You have yellowish-white fluid (pus) coming from the wound.  Your wound does not stay together after the staples have been taken out.  You see something coming out of the wound, such as wood or glass.  You have problems moving the injured area.  You have a fever or lasting symptoms for more than 2-3 days.  You have a fever and your symptoms suddenly get worse. MAKE SURE YOU:   Understand these instructions.  Will watch this condition.  Will get help right away if you are not doing well or get worse. Document Released: 12/16/2007 Document Revised: 12/01/2011 Document Reviewed: 09/19/2011 [facility] Patient Information 2014 [place], [place].  Laceration Care, Adult A laceration is  a cut or lesion that goes through all layers of the skin and into the tissue just beneath the skin. TREATMENT  Some lacerations may not require closure. Some lacerations may not be able to be closed due to an increased risk of infection. It is important to see your caregiver as soon as possible after an injury to minimize the risk of infection and maximize the opportunity for successful closure. If closure is appropriate, pain  medicines may be given, if needed. The wound will be cleaned to help prevent infection. Your caregiver will use stitches (sutures), staples, wound glue (adhesive), or skin adhesive strips to repair the laceration. These tools bring the skin edges together to allow for faster healing and a better cosmetic outcome. However, all wounds will heal with a scar. Once the wound has healed, scarring can be minimized by covering the wound with sunscreen during the day for 1 full year. HOME CARE INSTRUCTIONS  For sutures or staples:  Keep the wound clean and dry.  If you were given a bandage (dressing), you should change it at least once a day. Also, change the dressing if it becomes wet or dirty, or as directed by your caregiver.  Wash the wound with soap and water 2 times a day. Rinse the wound off with water to remove all soap. Pat the wound dry with a clean towel.  After cleaning, apply a thin layer of the antibiotic ointment as recommended by your caregiver. This will help prevent infection and keep the dressing from sticking.  You may shower as usual after the first 24 hours. Do not soak the wound in water until the sutures are removed.  Only take over-the-counter or prescription medicines for pain, discomfort, or fever as directed by your caregiver.  Get your sutures or staples removed as directed by your caregiver. For skin adhesive strips:  Keep the wound clean and dry.  Do not get the skin adhesive strips wet. You may bathe carefully, using caution to keep the wound dry.  If the wound gets wet, pat it dry with a clean towel.  Skin adhesive strips will fall off on their own. You may trim the strips as the wound heals. Do not remove skin adhesive strips that are still stuck to the wound. They will fall off in time. For wound adhesive:  You may briefly wet your wound in the shower or bath. Do not soak or scrub the wound. Do not swim. Avoid periods of heavy perspiration until the skin adhesive  has fallen off on its own. After showering or bathing, gently pat the wound dry with a clean towel.  Do not apply liquid medicine, cream medicine, or ointment medicine to your wound while the skin adhesive is in place. This may loosen the film before your wound is healed.  If a dressing is placed over the wound, be careful not to apply tape directly over the skin adhesive. This may cause the adhesive to be pulled off before the wound is healed.  Avoid prolonged exposure to sunlight or tanning lamps while the skin adhesive is in place. Exposure to ultraviolet light in the first year will darken the scar.  The skin adhesive will usually remain in place for 5 to 10 days, then naturally fall off the skin. Do not pick at the adhesive film. You may need a tetanus shot if:  You cannot remember when you had your last tetanus shot.  You have never had a tetanus shot. If you get a  tetanus shot, your arm may swell, get red, and feel warm to the touch. This is common and not a problem. If you need a tetanus shot and you choose not to have one, there is a rare chance of getting tetanus. Sickness from tetanus can be serious. SEEK MEDICAL CARE IF:   You have redness, swelling, or increasing pain in the wound.  You see a red line that goes away from the wound.  You have yellowish-white fluid (pus) coming from the wound.  You have a fever.  You notice a bad smell coming from the wound or dressing.  Your wound breaks open before or after sutures have been removed.  You notice something coming out of the wound such as wood or glass.  Your wound is on your hand or foot and you cannot move a finger or toe. SEEK IMMEDIATE MEDICAL CARE IF:   Your pain is not controlled with prescribed medicine.  You have severe swelling around the wound causing pain and numbness or a change in color in your arm, hand, leg, or foot.  Your wound splits open and starts bleeding.  You have worsening numbness, weakness,  or loss of function of any joint around or beyond the wound.  You develop painful lumps near the wound or on the skin anywhere on your body. MAKE SURE YOU:   Understand these instructions.  Will watch your condition.  Will get help right away if you are not doing well or get worse. Document Released: 03/08/2005 Document Revised: 05/31/2011 Document Reviewed: 09/01/2010 [facility] Patient Information 2014 [place]
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
## 2013-06-24 ENCOUNTER — Emergency Department (HOSPITAL_COMMUNITY)
Admission: EM | Admit: 2013-06-24 | Discharge: 2013-06-24 | Discharge status: Against Medical Advice | Payer: 59 | Attending: Emergency Medicine | Admitting: Emergency Medicine

## 2013-06-24 ENCOUNTER — Encounter (HOSPITAL_COMMUNITY): Payer: Self-pay | Admitting: Emergency Medicine

## 2013-06-24 DIAGNOSIS — R739 Hyperglycemia, unspecified: Secondary | ICD-10-CM

## 2013-06-24 DIAGNOSIS — Z8739 Personal history of other diseases of the musculoskeletal system and connective tissue: Secondary | ICD-10-CM | POA: Insufficient documentation

## 2013-06-24 DIAGNOSIS — E119 Type 2 diabetes mellitus without complications: Secondary | ICD-10-CM | POA: Insufficient documentation

## 2013-06-24 DIAGNOSIS — Z79899 Other long term (current) drug therapy: Secondary | ICD-10-CM | POA: Insufficient documentation

## 2013-06-24 DIAGNOSIS — Z794 Long term (current) use of insulin: Secondary | ICD-10-CM | POA: Insufficient documentation

## 2013-06-24 DIAGNOSIS — Z4802 Encounter for removal of sutures: Secondary | ICD-10-CM

## 2013-06-24 DIAGNOSIS — F17200 Nicotine dependence, unspecified, uncomplicated: Secondary | ICD-10-CM | POA: Insufficient documentation

## 2013-06-24 LAB — CBG MONITORING, ED: Glucose-Capillary: 579 mg/dL (ref 70–99)

## 2013-06-24 MED ORDER — TRAMADOL HCL 50 MG PO TABS: 50.0000 mg | ORAL_TABLET | Freq: Four times a day (QID) | ORAL | Status: DC | PRN
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
## 2013-06-24 NOTE — Discharge Instructions (Signed)
Staple Removal Care After The staples used to close your skin have been removed. The wound needs continued care so it can heal completely and without problems. The care described here will need to be done for another 5-10 days unless your caregiver advises otherwise.  HOME CARE INSTRUCTIONS   Keep wound site dry and clean.  If skin adhesive strips were applied after the staples were removed, they will begin to peel off in a few days. If they remain after fourteen days, they may be peeled off and discarded.  If you still have a dressing, change it at least once a day or as instructed by your caregiver. If the bandage sticks, soak it off with warm water. Pat dry with a clean towel. Look for signs of infection (see below).  Reapply cream or ointment according to your caregiver's instruction. This will help prevent infection and keep the bandage from sticking. Use of a non-stick material over the wound and under the dressing or wrap will also help keep the bandage from sticking.  If the bandage becomes wet, dirty or develops a foul smell, change it as soon as possible.  New scars become sunburned easily. Use sunscreens with protection factor (SPF) of at least 15 when out in the sun.  Only take over-the-counter or prescription medicines for pain, discomfort or fever as directed by your caregiver. SEEK IMMEDIATE MEDICAL CARE IF:   There is redness, swelling or increasing pain in the wound.  Pus is coming from the wound.  An unexplained oral temperature above 102 F (38.9 C) develops.  You notice a foul smell coming from the wound or dressing.  There is a breaking open of the suture line (edges not staying together) of the wound edges after staples have been removed. Document Released: 02/19/2008 Document Revised: 05/31/2011 Document Reviewed: 02/19/2008 [facility] Patient Information 2014 [place], [place].  Hyperglycemia Hyperglycemia occurs when the glucose (sugar) in your blood is too  high. Hyperglycemia can happen for many reasons, but it most often happens to people who do not know they have diabetes or are not managing their diabetes properly.  CAUSES  Whether you have diabetes or not, there are other causes of hyperglycemia. Hyperglycemia can occur when you have diabetes, but it can also occur in other situations that you might not be as aware of, such as: Diabetes  If you have diabetes and are having problems controlling your blood glucose, hyperglycemia could occur because of some of the following reasons:  Not following your meal plan.  Not taking your diabetes medications or not taking it properly.  Exercising less or doing less activity than you normally do.  Being sick. Pre-diabetes  This cannot be ignored. Before people develop Type 2 diabetes, they almost always have "pre-diabetes." This is when your blood glucose levels are higher than normal, but not yet high enough to be diagnosed as diabetes. Research has shown that some long-term damage to the body, especially the heart and circulatory system, may already be occurring during pre-diabetes. If you take action to manage your blood glucose when you have pre-diabetes, you may delay or prevent Type 2 diabetes from developing. Stress  If you have diabetes, you may be "diet" controlled or on oral medications or insulin to control your diabetes. However, you may find that your blood glucose is higher than usual in the hospital whether you have diabetes or not. This is often referred to as "stress hyperglycemia." Stress can elevate your blood glucose. This happens because of hormones  put out by the body during times of stress. If stress has been the cause of your high blood glucose, it can be followed regularly by your caregiver. That way he/she can make sure your hyperglycemia does not continue to get worse or progress to diabetes. Steroids  Steroids are medications that act on the infection fighting system (immune  system) to block inflammation or infection. One side effect can be a rise in blood glucose. Most people can produce enough extra insulin to allow for this rise, but for those who cannot, steroids make blood glucose levels go even higher. It is not unusual for steroid treatments to "uncover" diabetes that is developing. It is not always possible to determine if the hyperglycemia will go away after the steroids are stopped. A special blood test called an A1c is sometimes done to determine if your blood glucose was elevated before the steroids were started. SYMPTOMS  Thirsty.  Frequent urination.  Dry mouth.  Blurred vision.  Tired or fatigue.  Weakness.  Sleepy.  Tingling in feet or leg. DIAGNOSIS  Diagnosis is made by monitoring blood glucose in one or all of the following ways:  A1c test. This is a chemical found in your blood.  Fingerstick blood glucose monitoring.  Laboratory results. TREATMENT  First, knowing the cause of the hyperglycemia is important before the hyperglycemia can be treated. Treatment may include, but is not be limited to:  Education.  Change or adjustment in medications.  Change or adjustment in meal plan.  Treatment for an illness, infection, etc.  More frequent blood glucose monitoring.  Change in exercise plan.  Decreasing or stopping steroids.  Lifestyle changes. HOME CARE INSTRUCTIONS   Test your blood glucose as directed.  Exercise regularly. Your caregiver will give you instructions about exercise. Pre-diabetes or diabetes which comes on with stress is helped by exercising.  Eat wholesome, balanced meals. Eat often and at regular, fixed times. Your caregiver or nutritionist will give you a meal plan to guide your sugar intake.  Being at an ideal weight is important. If needed, losing as little as 10 to 15 pounds may help improve blood glucose levels. SEEK MEDICAL CARE IF:   You have questions about medicine, activity, or diet.  You  continue to have symptoms (problems such as increased thirst, urination, or weight gain). SEEK IMMEDIATE MEDICAL CARE IF:   You are vomiting or have diarrhea.  Your breath smells fruity.  You are breathing faster or slower.  You are very sleepy or incoherent.  You have numbness, tingling, or pain in your feet or hands.  You have chest pain.  Your symptoms get worse even though you have been following your caregiver's orders.  If you have any other questions or concerns. Document Released: 09/01/2000 Document Revised: 05/31/2011 Document Reviewed: 07/05/2011 [facility] Patient Information 2014 [place], [place].   You are leaving today against medical advice.  Your blood glucose level is dangerously elevated and you should stay for further treatment of this elevated blood glucose level. See your doctor tomorrow for this,  Or return here if you have any new symptoms as listed above.

## 2013-06-24 NOTE — ED Notes (Signed)
Went into tx room to give pt his prescription, paperwork, pt became very agitated that he was not getting anything for pain, explained to pt that he was getting a prescription for tramadol for pain, pt states "i know what those are and they are nothing", pt cursing in room, upset states " I only got a few pain pills last time and have been in pain all week", explained to pt that if he was not better to follow up with pcp, pt stood up throwing his hands in the air, states "I am done with this shit" and walked out the door,
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
## 2013-06-26 NOTE — ED Provider Notes (Signed)
Medical screening examination/treatment/procedure(s) were performed by non-physician practitioner and as supervising physician I was immediately available for consultation/collaboration.   EKG Interpretation None       Kaile Bixler R. Calianne Larue, MD 06/26/13 1457 

## 2013-09-26 ENCOUNTER — Encounter (HOSPITAL_COMMUNITY): Payer: Self-pay | Admitting: Emergency Medicine

## 2013-09-26 ENCOUNTER — Emergency Department (HOSPITAL_COMMUNITY)
Admission: EM | Admit: 2013-09-26 | Discharge: 2013-09-26 | Disposition: A | Discharge status: Home / Self Care | Payer: 59 | Attending: Emergency Medicine | Admitting: Emergency Medicine

## 2013-09-26 DIAGNOSIS — R739 Hyperglycemia, unspecified: Secondary | ICD-10-CM

## 2013-09-26 DIAGNOSIS — Z8739 Personal history of other diseases of the musculoskeletal system and connective tissue: Secondary | ICD-10-CM | POA: Insufficient documentation

## 2013-09-26 DIAGNOSIS — E119 Type 2 diabetes mellitus without complications: Secondary | ICD-10-CM | POA: Insufficient documentation

## 2013-09-26 DIAGNOSIS — F17200 Nicotine dependence, unspecified, uncomplicated: Secondary | ICD-10-CM | POA: Insufficient documentation

## 2013-09-26 DIAGNOSIS — Z79899 Other long term (current) drug therapy: Secondary | ICD-10-CM | POA: Insufficient documentation

## 2013-09-26 DIAGNOSIS — Z794 Long term (current) use of insulin: Secondary | ICD-10-CM | POA: Insufficient documentation

## 2013-09-26 LAB — URINALYSIS, ROUTINE W REFLEX MICROSCOPIC
Bilirubin Urine: NEGATIVE
Hgb urine dipstick: NEGATIVE
Ketones, ur: 15 mg/dL — AB
LEUKOCYTES UA: NEGATIVE
Nitrite: NEGATIVE
PROTEIN: NEGATIVE mg/dL
Specific Gravity, Urine: 1.015 (ref 1.005–1.030)
UROBILINOGEN UA: 0.2 mg/dL (ref 0.0–1.0)
pH: 5.5 (ref 5.0–8.0)

## 2013-09-26 LAB — BASIC METABOLIC PANEL
Anion gap: 15 (ref 5–15)
BUN: 15 mg/dL (ref 6–23)
CO2: 25 mEq/L (ref 19–32)
Calcium: 9.4 mg/dL (ref 8.4–10.5)
Chloride: 95 mEq/L — ABNORMAL LOW (ref 96–112)
Creatinine, Ser: 0.67 mg/dL (ref 0.50–1.35)
GFR calc Af Amer: >90 mL/min (ref >90)
GLUCOSE: 370 mg/dL — AB (ref 70–99)
POTASSIUM: 4.8 meq/L (ref 3.7–5.3)
SODIUM: 135 meq/L — AB (ref 137–147)

## 2013-09-26 LAB — CBG MONITORING, ED
GLUCOSE-CAPILLARY: 353 mg/dL — AB (ref 70–99)
GLUCOSE-CAPILLARY: 276 mg/dL — AB (ref 70–99)

## 2013-09-26 LAB — CBC WITH DIFFERENTIAL/PLATELET
Basophils Absolute: 0.1 10*3/uL (ref 0.0–0.1)
Basophils Relative: 1 % (ref 0–1)
EOS ABS: 0.2 10*3/uL (ref 0.0–0.7)
Eosinophils Relative: 3 % (ref 0–5)
HCT: 40.5 % (ref 39.0–52.0)
Hemoglobin: 14.2 g/dL (ref 13.0–17.0)
LYMPHS ABS: 1.6 10*3/uL (ref 0.7–4.0)
LYMPHS PCT: 26 % (ref 12–46)
MCH: 31.7 pg (ref 26.0–34.0)
MCHC: 35.1 g/dL (ref 30.0–36.0)
MCV: 90.4 fL (ref 78.0–100.0)
Monocytes Absolute: 1 10*3/uL (ref 0.1–1.0)
Monocytes Relative: 16 % — ABNORMAL HIGH (ref 3–12)
NEUTROS ABS: 3.4 10*3/uL (ref 1.7–7.7)
NEUTROS PCT: 54 % (ref 43–77)
PLATELETS: 172 10*3/uL (ref 150–400)
RBC: 4.48 MIL/uL (ref 4.22–5.81)
RDW: 12.4 % (ref 11.5–15.5)
WBC: 6.3 10*3/uL (ref 4.0–10.5)

## 2013-09-26 LAB — URINE MICROSCOPIC-ADD ON

## 2013-09-26 MED ORDER — SODIUM CHLORIDE 0.9 % IV BOLUS (SEPSIS)
1000.0000 mL | Freq: Once | INTRAVENOUS | Status: AC
  Administered 2013-09-26: 1000 mL via INTRAVENOUS

## 2013-09-26 MED ORDER — SODIUM CHLORIDE 0.9 % IV SOLN: INTRAVENOUS | Status: DC

## 2013-09-26 MED ORDER — INSULIN ASPART 100 UNIT/ML ~~LOC~~ SOLN
6.0000 [IU] | Freq: Once | SUBCUTANEOUS | Status: AC
  Administered 2013-09-26: 6 [IU] via SUBCUTANEOUS
  Filled 2013-09-26: qty 1
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
## 2013-09-26 NOTE — ED Notes (Signed)
Pt reporting fluctuating blood sugars. States they have been running high today. States he has appointment with his PCP to discuss adjusting insulin tomorrow.

## 2013-09-26 NOTE — ED Notes (Signed)
Pt states his blood sugars have been running high for the past several days and he "hurts all over"  Pt denies any new meds or steroids recently.

## 2013-09-26 NOTE — Discharge Instructions (Signed)
Try to eat a diabetic diet if possible. Followup with your Dr. as already scheduled tomorrow Hyperglycemia Hyperglycemia occurs when the glucose (sugar) in your blood is too high. Hyperglycemia can happen for many reasons, but it most often happens to people who do not know they have diabetes or are not managing their diabetes properly.  CAUSES  Whether you have diabetes or not, there are other causes of hyperglycemia. Hyperglycemia can occur when you have diabetes, but it can also occur in other situations that you might not be as aware of, such as: Diabetes  If you have diabetes and are having problems controlling your blood glucose, hyperglycemia could occur because of some of the following reasons:  Not following your meal plan.  Not taking your diabetes medications or not taking it properly.  Exercising less or doing less activity than you normally do.  Being sick. Pre-diabetes  This cannot be ignored. Before people develop Type 2 diabetes, they almost always have "pre-diabetes." This is when your blood glucose levels are higher than normal, but not yet high enough to be diagnosed as diabetes. Research has shown that some long-term damage to the body, especially the heart and circulatory system, may already be occurring during pre-diabetes. If you take action to manage your blood glucose when you have pre-diabetes, you may delay or prevent Type 2 diabetes from developing. Stress  If you have diabetes, you may be "diet" controlled or on oral medications or insulin to control your diabetes. However, you may find that your blood glucose is higher than usual in the hospital whether you have diabetes or not. This is often referred to as "stress hyperglycemia." Stress can elevate your blood glucose. This happens because of hormones put out by the body during times of stress. If stress has been the cause of your high blood glucose, it can be followed regularly by your caregiver. That way he/she  can make sure your hyperglycemia does not continue to get worse or progress to diabetes. Steroids  Steroids are medications that act on the infection fighting system (immune system) to block inflammation or infection. One side effect can be a rise in blood glucose. Most people can produce enough extra insulin to allow for this rise, but for those who cannot, steroids make blood glucose levels go even higher. It is not unusual for steroid treatments to "uncover" diabetes that is developing. It is not always possible to determine if the hyperglycemia will go away after the steroids are stopped. A special blood test called an A1c is sometimes done to determine if your blood glucose was elevated before the steroids were started. SYMPTOMS  Thirsty.  Frequent urination.  Dry mouth.  Blurred vision.  Tired or fatigue.  Weakness.  Sleepy.  Tingling in feet or leg. DIAGNOSIS  Diagnosis is made by monitoring blood glucose in one or all of the following ways:  A1c test. This is a chemical found in your blood.  Fingerstick blood glucose monitoring.  Laboratory results. TREATMENT  First, knowing the cause of the hyperglycemia is important before the hyperglycemia can be treated. Treatment may include, but is not be limited to:  Education.  Change or adjustment in medications.  Change or adjustment in meal plan.  Treatment for an illness, infection, etc.  More frequent blood glucose monitoring.  Change in exercise plan.  Decreasing or stopping steroids.  Lifestyle changes. HOME CARE INSTRUCTIONS   Test your blood glucose as directed.  Exercise regularly. Your caregiver will give you instructions about  exercise. Pre-diabetes or diabetes which comes on with stress is helped by exercising.  Eat wholesome, balanced meals. Eat often and at regular, fixed times. Your caregiver or nutritionist will give you a meal plan to guide your sugar intake.  Being at an ideal weight is  important. If needed, losing as little as 10 to 15 pounds may help improve blood glucose levels. SEEK MEDICAL CARE IF:   You have questions about medicine, activity, or diet.  You continue to have symptoms (problems such as increased thirst, urination, or weight gain). SEEK IMMEDIATE MEDICAL CARE IF:   You are vomiting or have diarrhea.  Your breath smells fruity.  You are breathing faster or slower.  You are very sleepy or incoherent.  You have numbness, tingling, or pain in your feet or hands.  You have chest pain.  Your symptoms get worse even though you have been following your caregiver's orders.  If you have any other questions or concerns. Document Released: 09/01/2000 Document Revised: 05/31/2011 Document Reviewed: 07/05/2011 [facility] Patient Information 2015 [place], [place]. This information is not intended to replace advice given to you by your health care provider. Make sure you discuss any questions you have with your health care provider.

## 2014-07-21 DIAGNOSIS — E1110 Type 2 diabetes mellitus with ketoacidosis without coma: Secondary | ICD-10-CM

## 2014-07-21 HISTORY — DX: Type 2 diabetes mellitus with ketoacidosis without coma: E11.10

## 2014-07-24 ENCOUNTER — Emergency Department (HOSPITAL_COMMUNITY): Payer: 59

## 2014-07-24 ENCOUNTER — Encounter (HOSPITAL_COMMUNITY): Payer: Self-pay | Admitting: Emergency Medicine

## 2014-07-24 ENCOUNTER — Inpatient Hospital Stay (HOSPITAL_COMMUNITY)
Admission: EM | Admit: 2014-07-24 | Discharge: 2014-07-27 | DRG: 637 | Disposition: A | Discharge status: Home / Self Care | Payer: 59 | Attending: Internal Medicine | Admitting: Internal Medicine

## 2014-07-24 DIAGNOSIS — E1310 Other specified diabetes mellitus with ketoacidosis without coma: Secondary | ICD-10-CM | POA: Diagnosis not present

## 2014-07-24 DIAGNOSIS — E876 Hypokalemia: Secondary | ICD-10-CM | POA: Diagnosis not present

## 2014-07-24 DIAGNOSIS — Z833 Family history of diabetes mellitus: Secondary | ICD-10-CM

## 2014-07-24 DIAGNOSIS — E875 Hyperkalemia: Secondary | ICD-10-CM | POA: Diagnosis present

## 2014-07-24 DIAGNOSIS — E871 Hypo-osmolality and hyponatremia: Secondary | ICD-10-CM | POA: Diagnosis not present

## 2014-07-24 DIAGNOSIS — E860 Dehydration: Secondary | ICD-10-CM | POA: Diagnosis present

## 2014-07-24 DIAGNOSIS — N179 Acute kidney failure, unspecified: Secondary | ICD-10-CM | POA: Diagnosis present

## 2014-07-24 DIAGNOSIS — M1990 Unspecified osteoarthritis, unspecified site: Secondary | ICD-10-CM | POA: Diagnosis present

## 2014-07-24 DIAGNOSIS — Z794 Long term (current) use of insulin: Secondary | ICD-10-CM

## 2014-07-24 DIAGNOSIS — E0590 Thyrotoxicosis, unspecified without thyrotoxic crisis or storm: Secondary | ICD-10-CM | POA: Diagnosis not present

## 2014-07-24 DIAGNOSIS — Z801 Family history of malignant neoplasm of trachea, bronchus and lung: Secondary | ICD-10-CM

## 2014-07-24 DIAGNOSIS — E1110 Type 2 diabetes mellitus with ketoacidosis without coma: Secondary | ICD-10-CM | POA: Diagnosis present

## 2014-07-24 DIAGNOSIS — E870 Hyperosmolality and hypernatremia: Secondary | ICD-10-CM | POA: Diagnosis not present

## 2014-07-24 DIAGNOSIS — E43 Unspecified severe protein-calorie malnutrition: Secondary | ICD-10-CM | POA: Diagnosis present

## 2014-07-24 DIAGNOSIS — Z681 Body mass index (BMI) 19 or less, adult: Secondary | ICD-10-CM

## 2014-07-24 DIAGNOSIS — F17210 Nicotine dependence, cigarettes, uncomplicated: Secondary | ICD-10-CM | POA: Diagnosis present

## 2014-07-24 LAB — URINALYSIS, ROUTINE W REFLEX MICROSCOPIC
Bilirubin Urine: NEGATIVE
Glucose, UA: >1000 mg/dL — AB
Ketones, ur: 40 mg/dL — AB
Leukocytes, UA: NEGATIVE
Nitrite: NEGATIVE
Protein, ur: 30 mg/dL — AB
Specific Gravity, Urine: 1.025 (ref 1.005–1.030)
Urobilinogen, UA: 0.2 mg/dL (ref 0.0–1.0)
pH: 5.5 (ref 5.0–8.0)

## 2014-07-24 LAB — BASIC METABOLIC PANEL
ANION GAP: 17 — AB (ref 5–15)
ANION GAP: 8 (ref 5–15)
BUN: 65 mg/dL — AB (ref 6–20)
BUN: 53 mg/dL — AB (ref 6–20)
BUN: 44 mg/dL — AB (ref 6–20)
CALCIUM: 8.1 mg/dL — AB (ref 8.9–10.3)
CHLORIDE: 95 mmol/L — AB (ref 101–111)
CHLORIDE: 120 mmol/L — AB (ref 101–111)
CO2: 9 mmol/L — ABNORMAL LOW (ref 22–32)
CO2: 16 mmol/L — AB (ref 22–32)
CO2: 24 mmol/L (ref 22–32)
CREATININE: 2.74 mg/dL — AB (ref 0.61–1.24)
CREATININE: 1.78 mg/dL — AB (ref 0.61–1.24)
Calcium: 8.2 mg/dL — ABNORMAL LOW (ref 8.9–10.3)
Calcium: 7.9 mg/dL — ABNORMAL LOW (ref 8.9–10.3)
Chloride: 114 mmol/L — ABNORMAL HIGH (ref 101–111)
Creatinine, Ser: 3.77 mg/dL — ABNORMAL HIGH (ref 0.61–1.24)
GFR calc Af Amer: 29 mL/min — ABNORMAL LOW (ref >60)
GFR calc non Af Amer: 25 mL/min — ABNORMAL LOW (ref >60)
GFR calc non Af Amer: 42 mL/min — ABNORMAL LOW (ref >60)
GFR, EST AFRICAN AMERICAN: 20 mL/min — AB (ref >60)
GFR, EST AFRICAN AMERICAN: 49 mL/min — AB (ref >60)
GFR, EST NON AFRICAN AMERICAN: 17 mL/min — AB (ref >60)
GLUCOSE: 1083 mg/dL — AB (ref 70–99)
Glucose, Bld: 465 mg/dL — ABNORMAL HIGH (ref 70–99)
Glucose, Bld: 203 mg/dL — ABNORMAL HIGH (ref 70–99)
Potassium: 5.5 mmol/L — ABNORMAL HIGH (ref 3.5–5.1)
Potassium: 3.6 mmol/L (ref 3.5–5.1)
Potassium: 3.1 mmol/L — ABNORMAL LOW (ref 3.5–5.1)
Sodium: 134 mmol/L — ABNORMAL LOW (ref 135–145)
Sodium: 147 mmol/L — ABNORMAL HIGH (ref 135–145)
Sodium: 152 mmol/L — ABNORMAL HIGH (ref 135–145)

## 2014-07-24 LAB — BLOOD GAS, VENOUS
Acid-base deficit: 28.8 mmol/L — ABNORMAL HIGH (ref 0.0–2.0)
Bicarbonate: 4.3 meq/L — ABNORMAL LOW (ref 20.0–24.0)
FIO2: 21 %
O2 Saturation: 97.1 %
TCO2: 4.3 mmol/L (ref 0–100)
pCO2, Ven: 20.8 mmHg — ABNORMAL LOW (ref 45.0–50.0)
pH, Ven: 6.947 — CL (ref 7.250–7.300)
pO2, Ven: 129 mmHg — ABNORMAL HIGH (ref 30.0–45.0)

## 2014-07-24 LAB — COMPREHENSIVE METABOLIC PANEL
ALBUMIN: 4.2 g/dL (ref 3.5–5.0)
ALK PHOS: 97 U/L (ref 38–126)
ALT: 16 U/L — ABNORMAL LOW (ref 17–63)
AST: 14 U/L — ABNORMAL LOW (ref 15–41)
BUN: 69 mg/dL — ABNORMAL HIGH (ref 6–20)
CO2: 6 mmol/L — AB (ref 22–32)
Calcium: 7.9 mg/dL — ABNORMAL LOW (ref 8.9–10.3)
Chloride: 85 mmol/L — ABNORMAL LOW (ref 101–111)
Creatinine, Ser: 4.36 mg/dL — ABNORMAL HIGH (ref 0.61–1.24)
GFR calc Af Amer: 17 mL/min — ABNORMAL LOW (ref >60)
GFR calc non Af Amer: 14 mL/min — ABNORMAL LOW (ref >60)
Glucose, Bld: 1248 mg/dL (ref 70–99)
Potassium: >7.5 mmol/L (ref 3.5–5.1)
SODIUM: 124 mmol/L — AB (ref 135–145)
TOTAL PROTEIN: 7.2 g/dL (ref 6.5–8.1)
Total Bilirubin: 1.8 mg/dL — ABNORMAL HIGH (ref 0.3–1.2)

## 2014-07-24 LAB — GLUCOSE, CAPILLARY
GLUCOSE-CAPILLARY: 330 mg/dL — AB (ref 70–99)
GLUCOSE-CAPILLARY: 267 mg/dL — AB (ref 70–99)
GLUCOSE-CAPILLARY: 125 mg/dL — AB (ref 70–99)
GLUCOSE-CAPILLARY: 64 mg/dL — AB (ref 70–99)
Glucose-Capillary: >600 mg/dL (ref 70–99)
Glucose-Capillary: 514 mg/dL — ABNORMAL HIGH (ref 70–99)
Glucose-Capillary: 476 mg/dL — ABNORMAL HIGH (ref 70–99)
Glucose-Capillary: 288 mg/dL — ABNORMAL HIGH (ref 70–99)
Glucose-Capillary: 207 mg/dL — ABNORMAL HIGH (ref 70–99)
Glucose-Capillary: 164 mg/dL — ABNORMAL HIGH (ref 70–99)
Glucose-Capillary: 166 mg/dL — ABNORMAL HIGH (ref 70–99)
Glucose-Capillary: 157 mg/dL — ABNORMAL HIGH (ref 70–99)

## 2014-07-24 LAB — I-STAT CG4 LACTIC ACID, ED
LACTIC ACID, VENOUS: 3.66 mmol/L — AB (ref 0.5–2.0)
Lactic Acid, Venous: 4.81 mmol/L (ref 0.5–2.0)

## 2014-07-24 LAB — CBC
HCT: 48.8 % (ref 39.0–52.0)
Hemoglobin: 15 g/dL (ref 13.0–17.0)
MCH: 31.8 pg (ref 26.0–34.0)
MCHC: 30.7 g/dL (ref 30.0–36.0)
MCV: 103.6 fL — AB (ref 78.0–100.0)
PLATELETS: 260 10*3/uL (ref 150–400)
RBC: 4.71 MIL/uL (ref 4.22–5.81)
RDW: 12.7 % (ref 11.5–15.5)
WBC: 21.7 10*3/uL — AB (ref 4.0–10.5)

## 2014-07-24 LAB — MRSA PCR SCREENING: MRSA by PCR: NEGATIVE

## 2014-07-24 LAB — TROPONIN I: Troponin I: <0.03 ng/mL (ref <0.031)

## 2014-07-24 LAB — CBG MONITORING, ED: Glucose-Capillary: >600 mg/dL (ref 70–99)

## 2014-07-24 LAB — BETA-HYDROXYBUTYRIC ACID: Beta-Hydroxybutyric Acid: >8 mmol/L — ABNORMAL HIGH (ref 0.05–0.27)

## 2014-07-24 LAB — URINE MICROSCOPIC-ADD ON

## 2014-07-24 MED ORDER — SODIUM CHLORIDE 0.9 % IV SOLN
INTRAVENOUS | Status: DC
  Administered 2014-07-24: 10.8 [IU]/h via INTRAVENOUS
  Filled 2014-07-24: qty 2.5

## 2014-07-24 MED ORDER — CETYLPYRIDINIUM CHLORIDE 0.05 % MT LIQD
7.0000 mL | Freq: Two times a day (BID) | OROMUCOSAL | Status: DC
  Administered 2014-07-24 (×2): 7 mL via OROMUCOSAL

## 2014-07-24 MED ORDER — SODIUM CHLORIDE 0.9 % IV SOLN: 1000.0000 mL | INTRAVENOUS | Status: DC

## 2014-07-24 MED ORDER — SODIUM CHLORIDE 0.9 % IV SOLN
INTRAVENOUS | Status: DC
  Administered 2014-07-24: 5.4 [IU]/h via INTRAVENOUS
  Filled 2014-07-24: qty 2.5

## 2014-07-24 MED ORDER — SODIUM BICARBONATE 8.4 % IV SOLN
50.0000 meq | Freq: Once | INTRAVENOUS | Status: AC
  Administered 2014-07-24: 50 meq via INTRAVENOUS
  Filled 2014-07-24: qty 50

## 2014-07-24 MED ORDER — SODIUM CHLORIDE 0.9 % IV BOLUS (SEPSIS)
1000.0000 mL | Freq: Once | INTRAVENOUS | Status: AC
  Administered 2014-07-24: 1000 mL via INTRAVENOUS

## 2014-07-24 MED ORDER — METHIMAZOLE 5 MG PO TABS
5.0000 mg | ORAL_TABLET | Freq: Two times a day (BID) | ORAL | Status: DC
  Administered 2014-07-24 – 2014-07-27 (×6): 5 mg via ORAL
  Filled 2014-07-24 (×11): qty 1

## 2014-07-24 MED ORDER — HEPARIN SODIUM (PORCINE) 5000 UNIT/ML IJ SOLN
5000.0000 [IU] | Freq: Three times a day (TID) | INTRAMUSCULAR | Status: DC
  Administered 2014-07-24 – 2014-07-26 (×8): 5000 [IU] via SUBCUTANEOUS
  Filled 2014-07-24 (×9): qty 1

## 2014-07-24 MED ORDER — OXYCODONE-ACETAMINOPHEN 5-325 MG PO TABS
1.0000 | ORAL_TABLET | Freq: Four times a day (QID) | ORAL | Status: DC | PRN
  Administered 2014-07-27: 2 via ORAL
  Filled 2014-07-24: qty 2

## 2014-07-24 MED ORDER — SODIUM CHLORIDE 0.9 % IV SOLN
1000.0000 mL | Freq: Once | INTRAVENOUS | Status: DC
Start: 2014-07-24 — End: 2014-07-24
  Administered 2014-07-24: 1000 mL via INTRAVENOUS

## 2014-07-24 MED ORDER — STERILE WATER FOR INJECTION IV SOLN
INTRAVENOUS | Status: DC
  Administered 2014-07-24: 10:00:00 via INTRAVENOUS
  Filled 2014-07-24 (×3): qty 850

## 2014-07-24 MED ORDER — CHLORHEXIDINE GLUCONATE 0.12 % MT SOLN
15.0000 mL | Freq: Two times a day (BID) | OROMUCOSAL | Status: DC
  Administered 2014-07-24: 15 mL via OROMUCOSAL
  Filled 2014-07-24: qty 15

## 2014-07-24 MED ORDER — SODIUM CHLORIDE 0.9 % IV SOLN
1000.0000 mL | Freq: Once | INTRAVENOUS | Status: AC
  Administered 2014-07-24: 1000 mL via INTRAVENOUS

## 2014-07-24 MED ORDER — SODIUM CHLORIDE 0.9 % IV SOLN
INTRAVENOUS | Status: DC
  Administered 2014-07-24: 15:00:00 via INTRAVENOUS

## 2014-07-24 MED ORDER — DEXTROSE-NACL 5-0.45 % IV SOLN
INTRAVENOUS | Status: DC
  Administered 2014-07-24: 18:00:00 via INTRAVENOUS

## 2014-07-24 MED ORDER — PNEUMOCOCCAL VAC POLYVALENT 25 MCG/0.5ML IJ INJ
0.5000 mL | INJECTION | INTRAMUSCULAR | Status: AC
  Administered 2014-07-25: 0.5 mL via INTRAMUSCULAR
  Filled 2014-07-24: qty 0.5

## 2014-07-24 MED ORDER — SODIUM POLYSTYRENE SULFONATE 15 GM/60ML PO SUSP
30.0000 g | Freq: Once | ORAL | Status: AC
  Administered 2014-07-24: 30 g via ORAL
  Filled 2014-07-24: qty 120

## 2014-07-24 MED ORDER — SODIUM CHLORIDE 0.9 % IV SOLN: INTRAVENOUS | Status: DC

## 2014-07-24 MED ORDER — DEXTROSE-NACL 5-0.45 % IV SOLN: INTRAVENOUS | Status: DC

## 2014-07-24 MED ORDER — LIVING WELL WITH DIABETES BOOK
Freq: Once | Status: AC
  Administered 2014-07-24: 15:00:00
  Filled 2014-07-24: qty 1

## 2014-07-24 MED ORDER — SODIUM CHLORIDE 0.9 % IV SOLN
INTRAVENOUS | Status: AC
Start: 2014-07-24 — End: 2014-07-24
  Administered 2014-07-24: 14:00:00 via INTRAVENOUS

## 2014-07-24 MED ORDER — INSULIN GLARGINE 100 UNIT/ML ~~LOC~~ SOLN
30.0000 [IU] | Freq: Every day | SUBCUTANEOUS | Status: DC
  Administered 2014-07-24: 30 [IU] via SUBCUTANEOUS
  Filled 2014-07-24 (×2): qty 0.3

## 2014-07-24 MED ORDER — TRAMADOL HCL 50 MG PO TABS
50.0000 mg | ORAL_TABLET | Freq: Four times a day (QID) | ORAL | Status: DC | PRN
  Administered 2014-07-24 – 2014-07-27 (×8): 50 mg via ORAL
  Filled 2014-07-24 (×8): qty 1

## 2014-07-24 MED ORDER — GABAPENTIN 300 MG PO CAPS
300.0000 mg | ORAL_CAPSULE | Freq: Two times a day (BID) | ORAL | Status: DC
  Administered 2014-07-24 – 2014-07-27 (×6): 300 mg via ORAL
  Filled 2014-07-24 (×6): qty 1

## 2014-07-24 MED ORDER — INSULIN ASPART 100 UNIT/ML IV SOLN
10.0000 [IU] | Freq: Once | INTRAVENOUS | Status: AC
  Administered 2014-07-24: 10 [IU] via INTRAVENOUS

## 2014-07-24 MED ORDER — CALCIUM GLUCONATE 10 % IV SOLN
1.0000 g | Freq: Once | INTRAVENOUS | Status: AC
  Administered 2014-07-24: 1 g via INTRAVENOUS
  Filled 2014-07-24: qty 10

## 2014-07-24 MED ORDER — ONDANSETRON HCL 4 MG/2ML IJ SOLN
4.0000 mg | Freq: Four times a day (QID) | INTRAMUSCULAR | Status: DC | PRN
  Administered 2014-07-24 – 2014-07-25 (×2): 4 mg via INTRAVENOUS
  Filled 2014-07-24 (×2): qty 2

## 2014-07-24 NOTE — ED Notes (Signed)
MD at bedside. 

## 2014-07-24 NOTE — H&P (Signed)
[facility] History and Physical  Kenneth RainwaterRandy C [name] WGN:[id number]:[id number] DOB: 10/24/1965 DOA: 07/24/2014  Referring physician: Dr. Manus Gunningancour, ER PCP: No PCP Per Patient   Chief Complaint: Nausea and vomiting  HPI: Kenneth RainwaterRandy C [name] is a 49 y.o. male who has a history of insulin-dependent diabetes, stopped his medication 2-3 days ago. He reports running out of testing supplies and was unable to check his blood sugar, and was therefore uncomfortable in giving himself insulin. He initially felt okay, but subsequently developed polyuria, polydipsia, blurry vision, abdominal cramping and nausea and vomiting. He presents to the emergency room and was found to be severe DKA with a pH of 6.9. Denies any fever, diarrhea. He does report a nonproductive cough. Does not have any chest pain. He is being admitted for further treatments.   Review of Systems:  Pertinent positives as per history of present illness, otherwise negative  Past Medical History  Diagnosis Date  . Diabetes mellitus   . Arthritis   . Thyroid disease    History reviewed. No pertinent past surgical history. Social History:  reports that he has been smoking.  He does not have any smokeless tobacco history on file. He reports that he does not drink alcohol or use illicit drugs.  No Known Allergies  Family history: Positive for diabetes. Mother had metastatic lung cancer  Prior to Admission medications   Medication Sig Start Date End Date Taking? Authorizing Provider  gabapentin (NEURONTIN) 300 MG capsule Take 300 mg by mouth 2 (two) times daily.    Historical Provider, MD  glyBURIDE-metformin (GLUCOVANCE) 5-500 MG per tablet Take 1 tablet by mouth 2 (two) times daily with a meal.    Historical Provider, MD  insulin aspart (NOVOLOG) 100 UNIT/ML injection Inject 15 Units into the skin 2 (two) times daily.     Historical Provider, MD  insulin NPH-regular Human (NOVOLIN 70/30) (70-30) 100 UNIT/ML injection Inject 10 Units into the skin 2  (two) times daily with a meal.    Historical Provider, MD  methimazole (TAPAZOLE) 5 MG tablet Take 5 mg by mouth 2 (two) times daily.    Historical Provider, MD  oxyCODONE-acetaminophen (PERCOCET/ROXICET) 5-325 MG per tablet Take 1-2 tablets by mouth every 6 (six) hours as needed for severe pain. 06/14/13   Benjiman CoreNathan Pickering, MD  sitaGLIPtin-metformin (JANUMET) 50-500 MG per tablet Take 1 tablet by mouth 2 (two) times daily with a meal.    Historical Provider, MD  traMADol (ULTRAM) 50 MG tablet Take 1 tablet (50 mg total) by mouth every 6 (six) hours as needed. 06/24/13   Burgess AmorJulie Idol, PA-C   Physical Exam: Filed Vitals:   07/24/14 1000 07/24/14 1030 07/24/14 1100 07/24/14 1123  BP: 127/76 112/76 117/73   Pulse: 93     Temp:      TempSrc:      Resp: 19 18 16    Height:    5\' 9"  (1.753 m)  Weight:    54.6 kg (120 lb 5.9 oz)  SpO2: 100%       Wt Readings from Last 3 Encounters:  07/24/14 54.6 kg (120 lb 5.9 oz)  09/26/13 65.772 kg (145 lb)  06/14/13 68.04 kg (150 lb)    General:  Appears calm and comfortable Eyes: PERRL, normal lids, irises & conjunctiva ENT: Mucous membranes are dry Neck: no LAD, masses or thyromegaly Cardiovascular: RRR, no m/r/g. No LE edema. Telemetry: SR, no arrhythmias  Respiratory: CTA bilaterally, no w/r/r. Normal respiratory effort. Abdomen: soft, ntnd Skin: no rash or induration  seen on limited exam Musculoskeletal: grossly normal tone BUE/BLE Psychiatric: grossly normal mood and affect, speech fluent and appropriate Neurologic: grossly non-focal.          Labs on Admission:  Basic Metabolic Panel:  Recent Labs Lab 07/24/14 0815 07/24/14 1017  NA 124* 134*  K >7.5* 5.5*  CL 85* 95*  CO2 6* 9*  GLUCOSE 1248* 1083*  BUN 69* 65*  CREATININE 4.36* 3.77*  CALCIUM 7.9* 8.2*   Liver Function Tests:  Recent Labs Lab 07/24/14 0815  AST 14*  ALT 16*  ALKPHOS 97  BILITOT 1.8*  PROT 7.2  ALBUMIN 4.2   No results for input(s): LIPASE, AMYLASE  in the last 168 hours. No results for input(s): AMMONIA in the last 168 hours. CBC:  Recent Labs Lab 07/24/14 0815  WBC 21.7*  HGB 15.0  HCT 48.8  MCV 103.6*  PLT 260   Cardiac Enzymes:  Recent Labs Lab 07/24/14 0815  TROPONINI <0.03    BNP (last 3 results) No results for input(s): BNP in the last 8760 hours.  ProBNP (last 3 results) No results for input(s): PROBNP in the last 8760 hours.  CBG:  Recent Labs Lab 07/24/14 0801 07/24/14 1039  GLUCAP >600* >600*    Radiological Exams on Admission: Dg Chest Portable 1 View  07/24/2014   CLINICAL DATA:  49 year old male with cough congestion chest pain and hyperglycemia. Initial encounter.  EXAM: PORTABLE CHEST - 1 VIEW  COMPARISON:  06/14/2013 and earlier.  FINDINGS: Portable AP upright view at 0834 hours. Stable lung volumes. Normal cardiac size and mediastinal contours. No pneumothorax, pleural effusion or pulmonary edema. Vague increased left mid lung opacity is felt to be artifactual (such as asymmetric grid placement). No confluent pulmonary opacity.  IMPRESSION: No acute cardiopulmonary abnormality; suspect artifactual increased opacity over the left mid lung.   Electronically Signed   By: Odessa FlemingH  Hall M.D.   On: 07/24/2014 08:46    EKG: Independently reviewed. Peak T waves  Assessment/Plan Active Problems:   DKA (diabetic ketoacidoses)   Dehydration   Acute renal failure   Hyperkalemia   Hyponatremia   Hyperthyroidism   1. Severe diabetic ketoacidosis. Patient will be started on intravenous insulin per DKA protocol. Continue aggressive hydration. Will transition back to subcutaneous insulin once his anion gap has closed. 2. Acute renal failure. Related to dehydration. Continue IV fluids. 3. Hyperkalemia. Related to dehydration and ketoacidosis. Improving with IV fluids. Patient received calcium gluconate for peaked T waves on EKG. 4. Hyponatremia. Related to hyperglycemia. Improving with  hydration. 5. Hyperthyroidism. Continue methimazole.  Code Status: full code DVT Prophylaxis: heparin sq Family Communication: discussed with patient Disposition Plan: discharge home once improved  Time spent: 60mins  [facility],Rica Heather [facility] Pager [phone]

## 2014-07-24 NOTE — ED Provider Notes (Signed)
CSN: [id number]     Arrival date & time 07/24/14  0751 History  This chart was scribed for Erick Blinks, MD by Jarvis Morgan, ED Scribe. This patient was seen in room IC08/IC08-01 and the patient's care was started at 8:06 AM.    Chief Complaint  Patient presents with  . Hyperglycemia    The history is provided by the patient and the EMS personnel. No language interpreter was used.    HPI Comments: Kenneth [name] is a 49 y.o. male with a h/o DM and thyroid disease brought in by EMS who presents to the Emergency Department complaining of episodic, moderate, emesis for 2 days. Pt notes 10-12 episodes of vomiting yesterday. He denies vomiting blood. He is complaining of associated subjective fever, abdominal pain,chest pain and SOB. Pt's fever upon arrival to ED was 96 F. Pt also notes that he feels like his BS has been running high but he has has not been checking it at home.Marland Kitchen He has not been taking his medication as prescribed. Pt has been out of his insulin for 2 days. He denies any sick contacts. He denies any recent travel. He denies any diarrhea. Pt does not have a PCP at this time.    Past Medical History  Diagnosis Date  . Diabetes mellitus   . Arthritis   . Thyroid disease    History reviewed. No pertinent past surgical history. History reviewed. No pertinent family history. History  Substance Use Topics  . Smoking status: Current Every Day Smoker  . Smokeless tobacco: Not on file  . Alcohol Use: No    Review of Systems A complete 10 system review of systems was obtained and all systems are negative except as noted in the HPI and PMH.    Allergies  Review of patient's allergies indicates no known allergies.  Home Medications   Prior to Admission medications   Medication Sig Start Date End Date Taking? Authorizing Provider  gabapentin (NEURONTIN) 300 MG capsule Take 600 mg by mouth 3 (three) times daily as needed (leg pain).    Yes Historical Provider, MD  insulin  aspart (NOVOLOG) 100 UNIT/ML injection Inject 2-4 Units into the skin 2 (two) times daily as needed for high blood sugar.    Yes Historical Provider, MD  insulin NPH-regular Human (NOVOLIN 70/30) (70-30) 100 UNIT/ML injection Inject 25 Units into the skin 2 (two) times daily with a meal.    Yes Historical Provider, MD  methimazole (TAPAZOLE) 5 MG tablet Take 5 mg by mouth 2 (two) times daily.   Yes Historical Provider, MD  oxyCODONE-acetaminophen (PERCOCET/ROXICET) 5-325 MG per tablet Take 1-2 tablets by mouth every 6 (six) hours as needed for severe pain. Patient not taking: Reported on 07/24/2014 06/14/13   Benjiman Core, MD   Triage Vitals: BP 101/41 mmHg  Pulse 92  Temp(Src) 96 F (35.6 C) (Rectal)  Resp 22  Ht  (1.753 m)  Wt 128 lb (58.06 kg)  BMI 18.89 kg/m2  SpO2 100%   Physical Exam  Constitutional: He is oriented to person, place, and time. No distress.  Uncomfortable, disheveled and cachetic appearing  HENT:  Head: Normocephalic and atraumatic.  Mouth/Throat: Oropharynx is clear and moist. Mucous membranes are dry. No oropharyngeal exudate.  Eyes: Conjunctivae and EOM are normal. Pupils are equal, round, and reactive to light.  Neck: Normal range of motion. Neck supple.  No meningismus.  Cardiovascular: Regular rhythm, normal heart sounds and intact distal pulses.  Tachycardia present.   No  murmur heard. Pulmonary/Chest: Effort normal and breath sounds normal. No respiratory distress.  Abdominal: Soft. There is tenderness (diffuse). There is no rebound and no guarding.  Musculoskeletal: Normal range of motion. He exhibits no edema or tenderness.  Neurological: He is alert and oriented to person, place, and time. No cranial nerve deficit. He exhibits normal muscle tone. Coordination normal.  No ataxia on finger to nose bilaterally. No pronator drift. 5/5 strength throughout. CN 2-12 intact. Negative Romberg. Equal grip strength. Sensation intact. Gait is normal.   Skin:  Skin is warm.  Psychiatric: He has a normal mood and affect. His behavior is normal.  Nursing note and vitals reviewed.  Ronnette Juniper.ranc ED Course  Procedures (including critical care time)  DIAGNOSTIC STUDIES: Oxygen Saturation is 100% on RA, normal by my interpretation.    COORDINATION OF CARE: 8:10 AM- Will order diagnostic lab work, CXR and EKG. Pt advised of plan for treatment and pt agrees.     Labs Review Labs Reviewed  CBC - Abnormal; Notable for the following:    WBC 21.7 (*)    MCV 103.6 (*)    All other components within normal limits  COMPREHENSIVE METABOLIC PANEL - Abnormal; Notable for the following:    Sodium 124 (*)    Potassium >7.5 (*)    Chloride 85 (*)    CO2 6 (*)    Glucose, Bld 1248 (*)    BUN 69 (*)    Creatinine, Ser 4.36 (*)    Calcium 7.9 (*)    AST 14 (*)    ALT 16 (*)    Total Bilirubin 1.8 (*)    GFR calc non Af Amer 14 (*)    GFR calc Af Amer 17 (*)    All other components within normal limits  URINALYSIS, ROUTINE W REFLEX MICROSCOPIC - Abnormal; Notable for the following:    Glucose, UA >1000 (*)    Hgb urine dipstick SMALL (*)    Ketones, ur 40 (*)    Protein, ur 30 (*)    All other components within normal limits  BLOOD GAS, VENOUS - Abnormal; Notable for the following:    pH, Ven 6.947 (*)    pCO2, Ven 20.8 (*)    pO2, Ven 129.0 (*)    Bicarbonate 4.3 (*)    Acid-base deficit 28.8 (*)    All other components within normal limits  BETA-HYDROXYBUTYRIC ACID - Abnormal; Notable for the following:    Beta-Hydroxybutyric Acid >8.00 (*)    All other components within normal limits  BASIC METABOLIC PANEL - Abnormal; Notable for the following:    Sodium 134 (*)    Potassium 5.5 (*)    Chloride 95 (*)    CO2 9 (*)    Glucose, Bld 1083 (*)    BUN 65 (*)    Creatinine, Ser 3.77 (*)    Calcium 8.2 (*)    GFR calc non Af Amer 17 (*)    GFR calc Af Amer 20 (*)    All other components within normal limits  BASIC METABOLIC PANEL - Abnormal;  Notable for the following:    Sodium 147 (*)    Chloride 114 (*)    CO2 16 (*)    Glucose, Bld 465 (*)    BUN 53 (*)    Creatinine, Ser 2.74 (*)    Calcium 7.9 (*)    GFR calc non Af Amer 25 (*)    GFR calc Af Amer 29 (*)    Anion  gap 17 (*)    All other components within normal limits  GLUCOSE, CAPILLARY - Abnormal; Notable for the following:    Glucose-Capillary >600 (*)    All other components within normal limits  GLUCOSE, CAPILLARY - Abnormal; Notable for the following:    Glucose-Capillary 514 (*)    All other components within normal limits  GLUCOSE, CAPILLARY - Abnormal; Notable for the following:    Glucose-Capillary 476 (*)    All other components within normal limits  GLUCOSE, CAPILLARY - Abnormal; Notable for the following:    Glucose-Capillary 330 (*)    All other components within normal limits  GLUCOSE, CAPILLARY - Abnormal; Notable for the following:    Glucose-Capillary 288 (*)    All other components within normal limits  CBG MONITORING, ED - Abnormal; Notable for the following:    Glucose-Capillary >600 (*)    All other components within normal limits  I-STAT CG4 LACTIC ACID, ED - Abnormal; Notable for the following:    Lactic Acid, Venous 4.81 (*)    All other components within normal limits  I-STAT CG4 LACTIC ACID, ED - Abnormal; Notable for the following:    Lactic Acid, Venous 3.66 (*)    All other components within normal limits  CBG MONITORING, ED - Abnormal; Notable for the following:    Glucose-Capillary >600 (*)    All other components within normal limits  MRSA PCR SCREENING  TROPONIN I  URINE MICROSCOPIC-ADD ON  BASIC METABOLIC PANEL  BASIC METABOLIC PANEL  HEMOGLOBIN A1C  BASIC METABOLIC PANEL  I-STAT CHEM 8, ED  I-STAT CG4 LACTIC ACID, ED  I-STAT CG4 LACTIC ACID, ED    Imaging Review Dg Chest Portable 1 View  07/24/2014   CLINICAL DATA:  49 year old male with cough congestion chest pain and hyperglycemia. Initial encounter.  EXAM:  PORTABLE CHEST - 1 VIEW  COMPARISON:  06/14/2013 and earlier.  FINDINGS: Portable AP upright view at 0834 hours. Stable lung volumes. Normal cardiac size and mediastinal contours. No pneumothorax, pleural effusion or pulmonary edema. Vague increased left mid lung opacity is felt to be artifactual (such as asymmetric grid placement). No confluent pulmonary opacity.  IMPRESSION: No acute cardiopulmonary abnormality; suspect artifactual increased opacity over the left mid lung.   Electronically Signed   By: Odessa Fleming M.D.   On: 07/24/2014 08:46     EKG Interpretation   Date/Time:  Wednesday Jul 24 2014 08:11:22 EDT Ventricular Rate:  93 PR Interval:  203 QRS Duration: 113 QT Interval:  427 QTC Calculation: 531 R Axis:   -112 Text Interpretation:  Sinus rhythm Borderline prolonged PR interval  Incomplete RBBB and LAFB Anterolateral infarct, old ST elevation, consider  inferior injury Tall T waves suggest hyperkalemia Prolonged QT interval No  previous ECGs available Confirmed by Manus Gunning  MD, Claudia Greenley [phone]) on  07/24/2014 8:24:45 AM      MDM   Final diagnoses:  Diabetic ketoacidosis without coma associated with type 2 diabetes mellitus   Patient critically ill with nausea, vomiting, abdominal pain, not taking insulin at home. High concern for DKA.  Tachycardic, dry mucous membranes, peak T waves and EKG. He is started on IV fluids and IV insulin.  Potassium 8, bicarbonate less than 5. Calcium, bicarbonate given  D/w Dr. Marchelle Gearing of critical care.  He recommends kayexalate, bicarb bolus and gtt, calcium.  He recommends rechecked labs in 1 hour and if improving, feels patient can stay at AP.  Lactate improving to 3. Bicarb improving to 8.  Continue IVF, bicarb gtt, insulin gtt. Dose calcium again for peaked T waves and hyperkalemia. D/w Dr. Kerry HoughMemon who will admit to ICU.  CRITICAL CARE Performed by: Glynn OctaveANCOUR, Lot Medford Total critical care time: 65 Critical care time was exclusive of  separately billable procedures and treating other patients. Critical care was necessary to treat or prevent imminent or life-threatening deterioration. Critical care was time spent personally by me on the following activities: development of treatment plan with patient and/or surrogate as well as nursing, discussions with consultants, evaluation of patient's response to treatment, examination of patient, obtaining history from patient or surrogate, ordering and performing treatments and interventions, ordering and review of laboratory studies, ordering and review of radiographic studies, pulse oximetry and re-evaluation of patient's condition.   I personally performed the services described in this documentation, which was scribed in my presence. The recorded information has been reviewed and is accurate.   Glynn OctaveStephen Loriel Diehl, MD 07/24/14 [phone]

## 2014-07-24 NOTE — ED Notes (Signed)
Pt brought in EMS. Per EMS, pt reports emesis,high blood sugar, x2 days.CBG en route 560.

## 2014-07-24 NOTE — ED Notes (Signed)
Lauren, primary nurse informed of glucose [phone]

## 2014-07-24 NOTE — Progress Notes (Signed)
INITIAL NUTRITION ASSESSMENT Pt meets criteria for SEVERE MALNUTRITION in the context of CHRONIC ILLNESS as evidenced by Loss of 20% bw in <1 year and severe muscle wasting.  DOCUMENTATION CODES Per approved criteria  -Severe malnutrition in the context of chronic illness -underweight   INTERVENTION: Once diet is advanced, Ensure Enlive po BID, each supplement provides 350 kcal and 20 grams of protein  Once diet order is advanced, snacks per pt request  Check A1c and educate on dietary changes or refer to other healthcare providers as appropriate   NUTRITION DIAGNOSIS: Increased calorie protein needs related to weight gain/muscle repletion in chronic illness as evidenced by severe muscle wasting  Goal: Oral intake to meet >90% of estimated needs  Monitor:  Oral intake, A1c and other labs, diet order, snack/supp preferences  Reason for Assessment: MST  49 y.o. male  Admitting Dx: <principal problem not specified>  ASSESSMENT: 49 y.o. male who has a history of insulin-dependent diabetes, stopped his medication 2-3 days ago. Developed polyuria, polydipsia, blurry vision, abdominal cramping and nausea and vomiting  Pt has had poor intake for the past couple days d/t nausea and vomiting whenever he would eat. Prior to that he denies any n/v/c/d. He has a very disordered eating pattern. He does not eat any "meals", rather he just snacks throughout the day. He says he does a lot of sandwiches.   He states his normal weight is 140, though it looks like he weighed much more then that years ago. Unsure if his decline in weight is due to uncontrolled DM-a1c pending-, inadequate intake, his hyperthyroid issues or combination of all  As of right now, he is extremely thirsty and has an appetite as well. Will order snacks/supps once diet advanced  Nutrition Focused Physical Exam:  Subcutaneous Fat:  Orbital Region: well nourished Upper Arm Region: n/a  Thoracic and Lumbar Region:  n/a  Muscle:  Temple Region: well noursihed Clavicle Bone Region: Severe muscle wasting Clavicle and Acromion Bone Region: Moderate muscle wasting Scapular Bone Region: n/a Dorsal Hand: well nourished Patellar Region: Severe muscle wasting Anterior Thigh Region: Moderate Muscle Wasting Posterior Calf Region: Severe Muscle Wasting  Edema: None noted   Height: Ht Readings from Last 1 Encounters:  07/24/14 5\' 9"  (1.753 m)    Weight: Wt Readings from Last 1 Encounters:  07/24/14 120 lb 5.9 oz (54.6 kg)    Ideal Body Weight: 160 lbs  % Ideal Body Weight: 75%  Wt Readings from Last 10 Encounters:  07/24/14 120 lb 5.9 oz (54.6 kg)  09/26/13 145 lb (65.772 kg)  06/14/13 150 lb (68.04 kg)  09/16/12 150 lb (68.04 kg)  06/29/12 140 lb (63.504 kg)  03/16/11 168 lb (76.204 kg)  Loss of 30 pounds in 11 months/1 year  Usual Body Weight: 140  % Usual Body Weight: 86%  BMI:  Body mass index is 17.77 kg/(m^2).  Estimated Nutritional Needs: Kcal: 1900-2100 (35-38 kcal/kg) Protein: 82-93 g (1.5-1.7g/kg) Fluid: per MD  Skin: Scattered bruises and scratches over body  Diet Order: Diet NPO time specified  EDUCATION NEEDS: -No education needs identified at this time   Intake/Output Summary (Last 24 hours) at 07/24/14 1347 Last data filed at 07/24/14 1327  Gross per 24 hour  Intake      0 ml  Output   1450 ml  Net  -1450 ml    Last BM: Unknown  Labs:   Recent Labs Lab 07/24/14 0815 07/24/14 1017  NA 124* 134*  K >7.5* 5.5*  CL 85* 95*  CO2 6* 9*  BUN 69* 65*  CREATININE 4.36* 3.77*  CALCIUM 7.9* 8.2*  GLUCOSE 1248* 1083*    CBG (last 3)   Recent Labs  07/24/14 1039 07/24/14 1139 07/24/14 1246  GLUCAP >600* >600* 514*    Scheduled Meds: . antiseptic oral rinse  7 mL Mouth Rinse q12n4p  . chlorhexidine  15 mL Mouth Rinse BID  . gabapentin  300 mg Oral BID  . heparin  5,000 Units Subcutaneous 3 times per day  . methimazole  5 mg Oral BID  .  [START ON 07/25/2014] pneumococcal 23 valent vaccine  0.5 mL Intramuscular Tomorrow-1000  . [DISCONTINUED] sodium chloride   Intravenous STAT    Continuous Infusions: . sodium chloride    . sodium chloride    . dextrose 5 % and 0.45% NaCl    . insulin (NOVOLIN-R) infusion 13.6 Units/hr (07/24/14 1253)    Past Medical History  Diagnosis Date  . Diabetes mellitus   . Arthritis   . Thyroid disease     History reviewed. No pertinent past surgical history.  Christophe LouisNathan Tyiesha Brackney RD, LDN Nutrition Pager: [phone] 07/24/2014 1:47 PM

## 2014-07-24 NOTE — ED Notes (Signed)
CRITICAL VALUE ALERT  Critical value received:  Potassium 8.4, Glucose 1248  Date of notification:  07/24/14  Time of notification:  900  Critical value read back:Yes.    Nurse who received alert:  Tilman NeatJennifer Alonda Weaber,RN  MD notified (1st page):  Dr. Manus Gunningancour  Time of first page:  900  MD notified (2nd page):  Time of second page:  Responding MD:  Dr. Manus Gunningancour  Time MD responded:  900

## 2014-07-25 DIAGNOSIS — E875 Hyperkalemia: Secondary | ICD-10-CM | POA: Diagnosis not present

## 2014-07-25 DIAGNOSIS — N179 Acute kidney failure, unspecified: Secondary | ICD-10-CM | POA: Diagnosis not present

## 2014-07-25 DIAGNOSIS — E1310 Other specified diabetes mellitus with ketoacidosis without coma: Secondary | ICD-10-CM | POA: Diagnosis not present

## 2014-07-25 DIAGNOSIS — E871 Hypo-osmolality and hyponatremia: Secondary | ICD-10-CM | POA: Diagnosis not present

## 2014-07-25 LAB — BASIC METABOLIC PANEL
ANION GAP: 5 (ref 5–15)
Anion gap: 5 (ref 5–15)
Anion gap: 10 (ref 5–15)
BUN: 38 mg/dL — ABNORMAL HIGH (ref 6–20)
BUN: 33 mg/dL — AB (ref 6–20)
BUN: 31 mg/dL — AB (ref 6–20)
CHLORIDE: 121 mmol/L — AB (ref 101–111)
CO2: 24 mmol/L (ref 22–32)
CO2: 25 mmol/L (ref 22–32)
CO2: 28 mmol/L (ref 22–32)
CREATININE: 1.2 mg/dL (ref 0.61–1.24)
CREATININE: 1.22 mg/dL (ref 0.61–1.24)
Calcium: 8 mg/dL — ABNORMAL LOW (ref 8.9–10.3)
Calcium: 7.9 mg/dL — ABNORMAL LOW (ref 8.9–10.3)
Calcium: 8.3 mg/dL — ABNORMAL LOW (ref 8.9–10.3)
Chloride: 120 mmol/L — ABNORMAL HIGH (ref 101–111)
Chloride: 115 mmol/L — ABNORMAL HIGH (ref 101–111)
Creatinine, Ser: 1.1 mg/dL (ref 0.61–1.24)
GFR calc Af Amer: >60 mL/min (ref >60)
GFR calc Af Amer: >60 mL/min (ref >60)
GFR calc non Af Amer: >60 mL/min (ref >60)
GFR calc non Af Amer: >60 mL/min (ref >60)
GLUCOSE: 59 mg/dL — AB (ref 70–99)
GLUCOSE: 70 mg/dL (ref 70–99)
Glucose, Bld: 53 mg/dL — ABNORMAL LOW (ref 70–99)
POTASSIUM: 3 mmol/L — AB (ref 3.5–5.1)
Potassium: 3 mmol/L — ABNORMAL LOW (ref 3.5–5.1)
Potassium: 3.3 mmol/L — ABNORMAL LOW (ref 3.5–5.1)
Sodium: 150 mmol/L — ABNORMAL HIGH (ref 135–145)
Sodium: 150 mmol/L — ABNORMAL HIGH (ref 135–145)
Sodium: 153 mmol/L — ABNORMAL HIGH (ref 135–145)

## 2014-07-25 LAB — HEMOGLOBIN A1C
Hgb A1c MFr Bld: 11.6 % — ABNORMAL HIGH (ref 4.8–5.6)
MEAN PLASMA GLUCOSE: 286 mg/dL

## 2014-07-25 LAB — GLUCOSE, CAPILLARY
GLUCOSE-CAPILLARY: 127 mg/dL — AB (ref 70–99)
GLUCOSE-CAPILLARY: 54 mg/dL — AB (ref 70–99)
GLUCOSE-CAPILLARY: 94 mg/dL (ref 70–99)
Glucose-Capillary: 103 mg/dL — ABNORMAL HIGH (ref 70–99)
Glucose-Capillary: 61 mg/dL — ABNORMAL LOW (ref 70–99)
Glucose-Capillary: 269 mg/dL — ABNORMAL HIGH (ref 70–99)
Glucose-Capillary: 225 mg/dL — ABNORMAL HIGH (ref 70–99)

## 2014-07-25 MED ORDER — INSULIN ASPART 100 UNIT/ML ~~LOC~~ SOLN
0.0000 [IU] | Freq: Every day | SUBCUTANEOUS | Status: DC
  Administered 2014-07-25: 2 [IU] via SUBCUTANEOUS

## 2014-07-25 MED ORDER — KCL IN DEXTROSE-NACL 20-5-0.45 MEQ/L-%-% IV SOLN: INTRAVENOUS | Status: DC

## 2014-07-25 MED ORDER — INSULIN ASPART 100 UNIT/ML ~~LOC~~ SOLN: 6.0000 [IU] | Freq: Three times a day (TID) | SUBCUTANEOUS | Status: DC

## 2014-07-25 MED ORDER — INSULIN ASPART 100 UNIT/ML ~~LOC~~ SOLN
0.0000 [IU] | Freq: Three times a day (TID) | SUBCUTANEOUS | Status: DC
  Administered 2014-07-26: 2 [IU] via SUBCUTANEOUS
  Administered 2014-07-26: 7 [IU] via SUBCUTANEOUS
  Administered 2014-07-27: 3 [IU] via SUBCUTANEOUS
  Administered 2014-07-27: 7 [IU] via SUBCUTANEOUS

## 2014-07-25 MED ORDER — INSULIN GLARGINE 100 UNIT/ML ~~LOC~~ SOLN
20.0000 [IU] | Freq: Every day | SUBCUTANEOUS | Status: DC
  Administered 2014-07-25: 20 [IU] via SUBCUTANEOUS
  Filled 2014-07-25 (×4): qty 0.2

## 2014-07-25 MED ORDER — INSULIN ASPART 100 UNIT/ML ~~LOC~~ SOLN: 0.0000 [IU] | Freq: Three times a day (TID) | SUBCUTANEOUS | Status: DC

## 2014-07-25 MED ORDER — INSULIN ASPART 100 UNIT/ML ~~LOC~~ SOLN
0.0000 [IU] | Freq: Three times a day (TID) | SUBCUTANEOUS | Status: DC
  Administered 2014-07-25: 5 [IU] via SUBCUTANEOUS

## 2014-07-25 MED ORDER — POTASSIUM CHLORIDE 2 MEQ/ML IV SOLN: INTRAVENOUS | Status: DC

## 2014-07-25 MED ORDER — INSULIN ASPART 100 UNIT/ML ~~LOC~~ SOLN: 4.0000 [IU] | Freq: Three times a day (TID) | SUBCUTANEOUS | Status: DC

## 2014-07-25 MED ORDER — INSULIN ASPART 100 UNIT/ML ~~LOC~~ SOLN
6.0000 [IU] | Freq: Three times a day (TID) | SUBCUTANEOUS | Status: DC
  Administered 2014-07-25: 6 [IU] via SUBCUTANEOUS

## 2014-07-25 NOTE — Progress Notes (Signed)
[facility] PROGRESS NOTE  Kenneth RainwaterRandy C [name] ZOX:[id number] DOB: 03/16/1966 DOA: 07/24/2014 PCP: No PCP Per Patient  Assessment/Plan: 1. Severe diabetic ketoacidosis. Patient has improved with intravenous insulin and IV fluids. Initial anion gap was elevated at 33. This has since closed. He transitioned to subcutaneous insulin, but blood sugars have been running low. Insulin when he to be further adjusted. Diabetes coordinator has been consulted. Continue current treatments. Patient takes 70/30 insulin as an outpatient. He would like to transition to a newer insulin. Will continue basal insulin for now. 2. Hypo-/hypernatremia. Initially presented with low sodium which was likely a pseudohyponatremia in the setting of hyperglycemia. Initially corrected with IV fluid administration. He has now developed hypernatremia which is likely secondary to aggressive saline infusion. We'll start the patient on hypotonic fluids and encourage by mouth water intake. 3. Hyperkalemia. Improved with IV fluids. 4. Acute renal failure. Related to dehydration. Resolved with IV fluids. 5. Hyperthyroidism. Continue methimazole.  Code Status: full code Family Communication: discussed with patient Disposition Plan: discharge home once improved   Consultants:    Procedures:    Antibiotics:    HPI/Subjective: Feeling better. No further nausea or vomiting. Tolerating diet thus far. Still very thirsty  Objective: Filed Vitals:   07/25/14 1000  BP: 87/54  Pulse: 70  Temp:   Resp: 18    Intake/Output Summary (Last 24 hours) at 07/25/14 1058 Last data filed at 07/25/14 0800  Gross per 24 hour  Intake 4045.03 ml  Output   2275 ml  Net 1770.03 ml   Filed Weights   07/24/14 0808 07/24/14 1123 07/25/14 0500  Weight: 58.06 kg (128 lb) 54.6 kg (120 lb 5.9 oz) 56.5 kg (124 lb 9 oz)    Exam:   General:  NAD  Cardiovascular: S1, S2 RRR  Respiratory: CTA B  Abdomen: soft, nt, nd,  bs+  Musculoskeletal: no edema b/l   Data Reviewed: Basic Metabolic Panel:  Recent Labs Lab 07/24/14 1451 07/24/14 1947 07/25/14 0022 07/25/14 0344 07/25/14 0747  NA 147* 152* 150* 150* 153*  K 3.6 3.1* 3.0* 3.0* 3.3*  CL 114* 120* 121* 120* 115*  CO2 16* 24 24 25 28   GLUCOSE 465* 203* 53* 59* 70  BUN 53* 44* 38* 33* 31*  CREATININE 2.74* 1.78* 1.20 1.10 1.22  CALCIUM 7.9* 8.1* 8.0* 7.9* 8.3*   Liver Function Tests:  Recent Labs Lab 07/24/14 0815  AST 14*  ALT 16*  ALKPHOS 97  BILITOT 1.8*  PROT 7.2  ALBUMIN 4.2   No results for input(s): LIPASE, AMYLASE in the last 168 hours. No results for input(s): AMMONIA in the last 168 hours. CBC:  Recent Labs Lab 07/24/14 0815  WBC 21.7*  HGB 15.0  HCT 48.8  MCV 103.6*  PLT 260   Cardiac Enzymes:  Recent Labs Lab 07/24/14 0815  TROPONINI <0.03   BNP (last 3 results) No results for input(s): BNP in the last 8760 hours.  ProBNP (last 3 results) No results for input(s): PROBNP in the last 8760 hours.  CBG:  Recent Labs Lab 07/24/14 2230 07/24/14 2319 07/25/14 0037 07/25/14 0747 07/25/14 0818  GLUCAP 157* 64* 127* 61* 103*    Recent Results (from the past 240 hour(s))  MRSA PCR Screening     Status: None   Collection Time: 07/24/14 11:15 AM  Result Value Ref Range Status   MRSA by PCR NEGATIVE NEGATIVE Final    Comment:        The GeneXpert MRSA Assay (FDA approved  for NASAL specimens only), is one component of a comprehensive MRSA colonization surveillance program. It is not intended to diagnose MRSA infection nor to guide or monitor treatment for MRSA infections.      Studies: Dg Chest Portable 1 View  07/24/2014   CLINICAL DATA:  49 year old male with cough congestion chest pain and hyperglycemia. Initial encounter.  EXAM: PORTABLE CHEST - 1 VIEW  COMPARISON:  06/14/2013 and earlier.  FINDINGS: Portable AP upright view at 0834 hours. Stable lung volumes. Normal cardiac size and  mediastinal contours. No pneumothorax, pleural effusion or pulmonary edema. Vague increased left mid lung opacity is felt to be artifactual (such as asymmetric grid placement). No confluent pulmonary opacity.  IMPRESSION: No acute cardiopulmonary abnormality; suspect artifactual increased opacity over the left mid lung.   Electronically Signed   By: Odessa FlemingH  Hall M.D.   On: 07/24/2014 08:46    Scheduled Meds: . gabapentin  300 mg Oral BID  . heparin  5,000 Units Subcutaneous 3 times per day  . insulin aspart  0-9 Units Subcutaneous TID WC  . insulin glargine  20 Units Subcutaneous QHS  . methimazole  5 mg Oral BID   Continuous Infusions:   Active Problems:   DKA (diabetic ketoacidoses)   Dehydration   Acute renal failure   Hyperkalemia   Hyponatremia   Hyperthyroidism    Time spent: 30mins    Rei Medlen  [facility] Pager [phone]. If 7PM-7AM, please contact night-coverage at [email or web address], password [email or web address] 07/25/2014, 10:58 AM  LOS: 1 day

## 2014-07-25 NOTE — Progress Notes (Signed)
DR NUDA'S OFFICE CALLED TO SAY THAT THEY CAN NOT SEE PT UNTIL HE HAS A PCP. MARIE BYRD RN DIABETIC COORDINATOR INFORMED..Marland Kitchen

## 2014-07-25 NOTE — Care Management Note (Signed)
Case Management Note  Patient Details  Name: Kenneth [name] MRN: [id number] Date of Birth: 04/25/1965  Expected Discharge Date:  07/26/14               Expected Discharge Plan:  Home/Self Care  In-House Referral:  Financial Counselor, NA  Discharge planning Services  CM Consult  Post Acute Care Choice:    Choice offered to:     DME Arranged:    DME Agency:     HH Arranged:    HH Agency:     Status of Service:  Completed, signed off  Medicare Important Message Given:    Date Medicare IM Given:    Medicare IM give by:    Date Additional Medicare IM Given:    Additional Medicare Important Message give by:     If discussed at Long Length of Stay Meetings, dates discussed:    Additional Comments: Pt is from home, independent at baseline. Pt has no HH services or DME's prior to admission. Pt referred for med needs and PCP needs. Pt's insurance has been verified and [facility]. No med assistance is available or necessary. Diabetes coordinator to f/u with pharmacy to determine why pt had trouble filling insulin last week. Pt has PCP, Dr. Karilyn CotaGosrani, but is interested in finding a new one. Pt given a list of PCP's in the area accepting new pt's. Pt request to see financial counselor while in the hospital to discuss disability application. Referral has been made. No further CM needs.  [name], Kenneth Sheeley Demske, RN 07/25/2014, 1:29 PM

## 2014-07-25 NOTE — Progress Notes (Signed)
Spoke with patient about diabetes and home regimen for diabetes control. Patient reports that he use to see Dr. Karilyn CotaGosrani as his PCP but admits that he has "not seen in a long time and I don't really trust him as my doctor". Patient initially stated that he was taking is 70/30 15-25 units TID depending on how blood sugar is running and Novolog correction scale 2-3 times per day as an outpatient for diabetes control.  Patient reports that he ran out of insulin 3-4 days prior to being admitted. Inquired about why he ran out of insulin. Patient states that he has [facility] and that when he went to get his insulin they said he could not get the insulin at that time. Patient reports that he does not know why he could not get his insulin refilled but [facility] informed that patient is not prescribed 70/30 or Novolog. Joselyn Glassmanyler, RPh at [facility] states that patient is prescribed Lantus 50 units daily and Humalog sliding scale up to a total of 50 units per day. Both the Lantus and Humalog were last filled on March 16, 2014 and both are available for refill at this time if needed. After getting [facility] the [facility] with [facility], informed patient about the insulins and patient states "Oh yeah, I was thinking I was still taking the 70/30 but I am on Lantus and Humalog."  Inquired about knowledge about A1C and patient reports that he does not know what an A1C is. Discussed A1C results (11.6% on 07/24/14) and explained what an A1C is, basic pathophysiology of DM Type 1, basic home care, importance of checking CBGs and maintaining good CBG control to prevent long-term and short-term complications. Discussed impact of nutrition, exercise, stress, sickness, and medications on diabetes control.  Patient reports that his glucose is always high and sometimes too high for his glucometer to read.  According to the patient he  becomes symptomatically feeling like his glucose is too low when his glucose gets less than 100 mg/dl and he fears having lows. Explained that his body is use to his glucose running high and when it gets down within a normal range it makes him feel low. Discussed importance of getting blood glucose and A1C down within goal to prevent complications associated with uncontrolled diabetes and explained that his body will have to readjust to a new normal of having lower blood glucose levels. Patient reports that he has lost 10-15 lbs over the past few days prior to being admitted. Discussed DKA  in simple terms and explained how the body breaks down fat and muscle when it can not get any glucose due the lack of insulin. Explained to patient that he absolutely needs insulin and he needs to take insulin as prescribed. Inquired about seeing an endocrinologist and patient states that he would like to be referred to a local endocrinologist. Patient states that he asked Dr. Karilyn CotaGosrani on several occassions to refer him to Dr. Fransico HimNida but Dr. Karilyn CotaGosrani would not refer him. Patient would like to find a new PCP and would like to be referred to Dr. Fransico HimNida. Consulted CM for help with finding a new PCP to verify insurance is active.  Patient also states that he has [facility] in the past and would like to reapply. Asked CM to have financial advisor see patient for those needs.  Patient verbalized understanding of information discussed and he states  that he has no further questions at this time related to diabetes. Called Dr. Isidoro DonningNida's office and made patient an appointment for May 10th at 9:00 am for follow up as requested by Dr. Kerry HoughMemon.      Thanks, Orlando PennerMarie Naveh Rickles, RN, MSN, CCRN, CDE Diabetes Coordinator Inpatient Diabetes Program [phone] (Team Pager) [phone] ([place] office) [phone] [facility] office)

## 2014-07-25 NOTE — Progress Notes (Signed)
Hypoglycemic Event  CBG: 54  Treatment: 15 GM carbohydrate snack  Symptoms: Hungry and Vision changes  Follow-up CBG: Time:1645 CBG Result: 94  Possible Reasons for Event: Unknown  Comments/MD notified:MD notified    Schonewitz, Candelaria StagersLeigh Anne  Remember to initiate Hypoglycemia Order Set & complete

## 2014-07-25 NOTE — Progress Notes (Signed)
Called report to [place]anna, Charity fundraiserN on dept 300.  Verbalized understanding.  Pt transferred to room 337 in safe and stable condition. Schonewitz, Candelaria StagersLeigh Anne 07/25/2014

## 2014-07-25 NOTE — Progress Notes (Signed)
Inpatient Diabetes Program Recommendations  AACE/ADA: New Consensus Statement on Inpatient Glycemic Control (2013)  Target Ranges:  Prepandial:   less than 140 mg/dL      Peak postprandial:   less than 180 mg/dL (1-2 hours)      Critically ill patients:  140 - 180 mg/dL   Results for Kenneth [name], Kenneth [name] (MRN [id number]) as of 07/25/2014 08:53  Ref. Range 07/24/2014 22:30 07/24/2014 23:19 07/25/2014 00:37 07/25/2014 07:47  Glucose-Capillary Latest Ref Range: 70-99 mg/dL 409157 (H) 64 (L) 811127 (H) 61 (L)   Diabetes history: DM Outpatient Diabetes medications: 70/30 25 units BID, Novolog 2-4 units BID for high glucose Current orders for Inpatient glycemic control: Lantus 30 units QHS, Novolog 0-9 units TID with meals  Inpatient Diabetes Program Recommendations Insulin - Basal: Lab glucose 59 mg/dl at 9:143:44 am and finger stick 61 mg/dl at 7:827:47 am on 5/5. Please consider decreasing Lantus to 22 units QHS (based on 56 kg x 0.4 units). Correction (SSI): Please add Novolog bedtime correction scale. HgbA1C: A1C 11.6% on 07/24/14. Plan to talk with patient today.  Thanks, Kenneth PennerMarie Safaa Stingley, RN, MSN, CCRN, CDE Diabetes Coordinator Inpatient Diabetes Program [phone] (Team Pager from 8am to 5pm) [phone] ([place] office) [phone] [employer]([facility] office)

## 2014-07-26 DIAGNOSIS — E0590 Thyrotoxicosis, unspecified without thyrotoxic crisis or storm: Secondary | ICD-10-CM | POA: Diagnosis not present

## 2014-07-26 DIAGNOSIS — E875 Hyperkalemia: Secondary | ICD-10-CM | POA: Diagnosis not present

## 2014-07-26 DIAGNOSIS — E1310 Other specified diabetes mellitus with ketoacidosis without coma: Secondary | ICD-10-CM | POA: Diagnosis not present

## 2014-07-26 DIAGNOSIS — N179 Acute kidney failure, unspecified: Secondary | ICD-10-CM | POA: Diagnosis not present

## 2014-07-26 LAB — GLUCOSE, CAPILLARY
Glucose-Capillary: 43 mg/dL — CL (ref 70–99)
Glucose-Capillary: 99 mg/dL (ref 70–99)
Glucose-Capillary: 195 mg/dL — ABNORMAL HIGH (ref 70–99)
Glucose-Capillary: 346 mg/dL — ABNORMAL HIGH (ref 70–99)
Glucose-Capillary: 211 mg/dL — ABNORMAL HIGH (ref 70–99)

## 2014-07-26 LAB — CBC
HCT: 37.6 % — ABNORMAL LOW (ref 39.0–52.0)
Hemoglobin: 13.3 g/dL (ref 13.0–17.0)
MCH: 32 pg (ref 26.0–34.0)
MCHC: 35.4 g/dL (ref 30.0–36.0)
MCV: 90.6 fL (ref 78.0–100.0)
PLATELETS: 124 10*3/uL — AB (ref 150–400)
RBC: 4.15 MIL/uL — ABNORMAL LOW (ref 4.22–5.81)
RDW: 12.8 % (ref 11.5–15.5)
WBC: 9.7 10*3/uL (ref 4.0–10.5)

## 2014-07-26 LAB — BASIC METABOLIC PANEL
Anion gap: 7 (ref 5–15)
BUN: 21 mg/dL — AB (ref 6–20)
CHLORIDE: 108 mmol/L (ref 101–111)
CO2: 33 mmol/L — ABNORMAL HIGH (ref 22–32)
Calcium: 8.5 mg/dL — ABNORMAL LOW (ref 8.9–10.3)
Creatinine, Ser: 0.81 mg/dL (ref 0.61–1.24)
GFR calc Af Amer: >60 mL/min (ref >60)
Glucose, Bld: 35 mg/dL — CL (ref 70–99)
Potassium: 2.8 mmol/L — ABNORMAL LOW (ref 3.5–5.1)
Sodium: 148 mmol/L — ABNORMAL HIGH (ref 135–145)

## 2014-07-26 MED ORDER — POTASSIUM CHLORIDE CRYS ER 20 MEQ PO TBCR
40.0000 meq | EXTENDED_RELEASE_TABLET | ORAL | Status: AC
  Administered 2014-07-26 (×3): 40 meq via ORAL
  Filled 2014-07-26 (×3): qty 2

## 2014-07-26 MED ORDER — POTASSIUM CHLORIDE IN NACL 20-0.45 MEQ/L-% IV SOLN
INTRAVENOUS | Status: DC
  Administered 2014-07-26: 16:00:00 via INTRAVENOUS
  Filled 2014-07-26 (×10): qty 1000

## 2014-07-26 MED ORDER — SODIUM CHLORIDE 0.45 % IV SOLN: INTRAVENOUS | Status: DC

## 2014-07-26 MED ORDER — INSULIN GLARGINE 100 UNIT/ML ~~LOC~~ SOLN
18.0000 [IU] | Freq: Every day | SUBCUTANEOUS | Status: DC
  Administered 2014-07-26: 18 [IU] via SUBCUTANEOUS
  Filled 2014-07-26 (×3): qty 0.18

## 2014-07-26 MED ORDER — ENSURE ENLIVE PO LIQD
237.0000 mL | Freq: Two times a day (BID) | ORAL | Status: DC
  Administered 2014-07-26 – 2014-07-27 (×3): 237 mL via ORAL

## 2014-07-26 NOTE — Clinical Documentation Improvement (Signed)
07/24/14 per Nutrition Assessment..." DOCUMENTATION CODES Per approved criteria  -Severe malnutrition in the context of chronic illness -underweight      Goal:Oral intake to meet >90% of estimated needs Monitor: Oral intake, A1c and other labs, diet order, snack/supp preferences Nutrition Focused Physical Exam: see nutr eval 07/24/14..."  For accurrate Dx specificity & severity can noted Nutrition assessment be further validated for cond being eval'd, mon'd & tx'd as noted. Thank you  Possible conditions: Malnutrition: . Severity: --Mild  --Moderate  --Severe  . Avoid documenting a range of severity, such as "moderate to severe" --Other . Document any associated diagnoses/conditions  Supporting Information: see full nutrition eval 07/24/14  Thank You, Toribio Harbourphelia R Maury Bamba, RN, BSN, CCDS Certified Clinical Documentation Specialist Pager: [phone] [place]: Health Information Management

## 2014-07-26 NOTE — Progress Notes (Signed)
CRITICAL VALUE ALERT  Critical value received:  Blood sugar 35  Date of notification: 07/26/14  Time of notification: 7:42  Critical value read back:Yes.    Nurse who received alert:  Emilie RutterJoanna Ramces Shomaker  MD notified (1st page): 7:44  Time of first page:  7:44  MD notified (2nd page):  Time of second page:  Responding MD:  Kerry HoughMemon  Time MD responded:

## 2014-07-26 NOTE — Progress Notes (Signed)
Hypoglycemic Event  CBG: 43  Treatment: 15 GM carbohydrate snack  Symptoms: None  Follow-up CBG: Time:0800 CBG Result:99  Possible Reasons for Event: Other: unknown, morning insulin was held  Comments/MD notified:Yes, Galen DaftMemon    Zoie Sarin M  Remember to initiate Hypoglycemia Order Set & complete

## 2014-07-26 NOTE — Progress Notes (Signed)
Inpatient Diabetes Program Recommendations  AACE/ADA: New Consensus Statement on Inpatient Glycemic Control (2013)  Target Ranges:  Prepandial:   less than 140 mg/dL      Peak postprandial:   less than 180 mg/dL (1-2 hours)      Critically ill patients:  140 - 180 mg/dL   Reason for Assessment:  Results for Kenneth RainwaterVERNON, Yaviel C (MRN [id number]) as of 07/26/2014 13:56  Ref. Range 07/26/2014 07:30 07/26/2014 08:04 07/26/2014 11:56  Glucose-Capillary Latest Ref Range: 70-99 mg/dL 43 (LL) 99 782195 (H)    Diabetes history: Type 1 diabetes Outpatient Diabetes medications: 70/30 25 units bid (however according to outpatient pharmacy he was supposed to be taking Lantus/Novolog)  Current orders for Inpatient glycemic control:  Lantus 20 units q HS, Novolog sensitive tid with meals and HS  Patients CBG's were again low this morning.  May consider d/c of HS scale. Also consider very low dose Novolog meal coverage of 2 units tid with meals.  May need slight decrease in Lantus also to 18 units q HS.   Thanks, Beryl MeagerJenny Ikhlas Albo, RN, BC-ADM Inpatient Diabetes Coordinator Pager [phone] (8a-5p)

## 2014-07-26 NOTE — Progress Notes (Signed)
[facility] PROGRESS NOTE  Kenneth [name] [id number] DOB: 05/28/1965 DOA: 07/24/2014 PCP: No PCP Per Patient  Assessment/Plan: 1. Severe diabetic ketoacidosis. Patient has improved with intravenous insulin and IV fluids. Initial anion gap was elevated at 33. This has since closed. He was transitioned to subcutaneous insulin, but he continues to have low blood sugars. Will decrease lantus and discontinue HS sliding scale coverage. 2. Hypo-/hypernatremia. Initially presented with low sodium which was likely a pseudohyponatremia in the setting of hyperglycemia. Initially corrected with IV fluid administration. He has now developed hypernatremia which is likely secondary to aggressive saline infusion. We'll start the patient on hypotonic fluids and encourage by mouth water intake. 3. Hyperkalemia. Improved with IV fluids. 4. Hypokalemia. replace 5. Acute renal failure. Related to dehydration. Resolved with IV fluids. 6. Hyperthyroidism. Continue methimazole. 7. Hypotension. Likely related to dehydration. Continue IV fluids  Code Status: full code Family Communication: discussed with patient Disposition Plan: discharge home once improved   Consultants:    Procedures:    Antibiotics:    HPI/Subjective: Had episode of hypoglycemia this morning. Feels dizzy on standing. No further vomiting. Tolerating diet  Objective: Filed Vitals:   07/26/14 1454  BP: 99/67  Pulse: 63  Temp: 98.7 F (37.1 C)  Resp: 18    Intake/Output Summary (Last 24 hours) at 07/26/14 1644 Last data filed at 07/26/14 1300  Gross per 24 hour  Intake    600 ml  Output      0 ml  Net    600 ml   Filed Weights   07/24/14 0808 07/24/14 1123 07/25/14 0500  Weight: 58.06 kg (128 lb) 54.6 kg (120 lb 5.9 oz) 56.5 kg (124 lb 9 oz)    Exam:   General:  NAD  Cardiovascular: S1, S2 regular  Respiratory: clear lung sounds bilaterally  Abdomen: soft, nt, nd, bs+  Musculoskeletal: no edema b/l    Data Reviewed: Basic Metabolic Panel:  Recent Labs Lab 07/24/14 1947 07/25/14 0022 07/25/14 0344 07/25/14 0747 07/26/14 0710  NA 152* 150* 150* 153* 148*  K 3.1* 3.0* 3.0* 3.3* 2.8*  CL 120* 121* 120* 115* 108  CO2 24 24 25 28  33*  GLUCOSE 203* 53* 59* 70 35*  BUN 44* 38* 33* 31* 21*  CREATININE 1.78* 1.20 1.10 1.22 0.81  CALCIUM 8.1* 8.0* 7.9* 8.3* 8.5*   Liver Function Tests:  Recent Labs Lab 07/24/14 0815  AST 14*  ALT 16*  ALKPHOS 97  BILITOT 1.8*  PROT 7.2  ALBUMIN 4.2   No results for input(s): LIPASE, AMYLASE in the last 168 hours. No results for input(s): AMMONIA in the last 168 hours. CBC:  Recent Labs Lab 07/24/14 0815 07/26/14 0710  WBC 21.7* 9.7  HGB 15.0 13.3  HCT 48.8 37.6*  MCV 103.6* 90.6  PLT 260 124*   Cardiac Enzymes:  Recent Labs Lab 07/24/14 0815  TROPONINI <0.03   BNP (last 3 results) No results for input(s): BNP in the last 8760 hours.  ProBNP (last 3 results) No results for input(s): PROBNP in the last 8760 hours.  CBG:  Recent Labs Lab 07/25/14 1645 07/25/14 2036 07/26/14 0730 07/26/14 0804 07/26/14 1156  GLUCAP 94 225* 43* 99 195*    Recent Results (from the past 240 hour(s))  MRSA PCR Screening     Status: None   Collection Time: 07/24/14 11:15 AM  Result Value Ref Range Status   MRSA by PCR NEGATIVE NEGATIVE Final    Comment:  The GeneXpert MRSA Assay (FDA approved for NASAL specimens only), is one component of a comprehensive MRSA colonization surveillance program. It is not intended to diagnose MRSA infection nor to guide or monitor treatment for MRSA infections.      Studies: No results found.  Scheduled Meds: . feeding supplement (ENSURE ENLIVE)  237 mL Oral BID BM  . gabapentin  300 mg Oral BID  . heparin  5,000 Units Subcutaneous 3 times per day  . insulin aspart  0-9 Units Subcutaneous TID WC  . insulin glargine  18 Units Subcutaneous QHS  . methimazole  5 mg Oral BID  .  potassium chloride  40 mEq Oral Q4H   Continuous Infusions: . 0.45 % NaCl with KCl 20 mEq / L 125 mL/hr at 07/26/14 1542    Active Problems:   DKA (diabetic ketoacidoses)   Dehydration   Acute renal failure   Hyperkalemia   Hyponatremia   Hyperthyroidism    Time spent: 30mins    Ashlyn Cabler  [facility] Pager [phone]. If 7PM-7AM, please contact night-coverage at [email or web address], password Kenneth [name] 07/26/2014, 4:44 PM  LOS: 2 days

## 2014-07-26 NOTE — Progress Notes (Signed)
BS is now 99. Pt is eating breakfast

## 2014-07-27 DIAGNOSIS — N179 Acute kidney failure, unspecified: Secondary | ICD-10-CM | POA: Diagnosis not present

## 2014-07-27 DIAGNOSIS — E0590 Thyrotoxicosis, unspecified without thyrotoxic crisis or storm: Secondary | ICD-10-CM | POA: Diagnosis not present

## 2014-07-27 DIAGNOSIS — E871 Hypo-osmolality and hyponatremia: Secondary | ICD-10-CM | POA: Diagnosis not present

## 2014-07-27 DIAGNOSIS — E1310 Other specified diabetes mellitus with ketoacidosis without coma: Secondary | ICD-10-CM | POA: Diagnosis not present

## 2014-07-27 LAB — BASIC METABOLIC PANEL
Anion gap: 3 — ABNORMAL LOW (ref 5–15)
BUN: 19 mg/dL (ref 6–20)
CALCIUM: 8.2 mg/dL — AB (ref 8.9–10.3)
CO2: 30 mmol/L (ref 22–32)
CREATININE: 0.75 mg/dL (ref 0.61–1.24)
Chloride: 105 mmol/L (ref 101–111)
GFR calc Af Amer: >60 mL/min (ref >60)
GFR calc non Af Amer: >60 mL/min (ref >60)
GLUCOSE: 288 mg/dL — AB (ref 70–99)
Potassium: 4.5 mmol/L (ref 3.5–5.1)
Sodium: 138 mmol/L (ref 135–145)

## 2014-07-27 LAB — GLUCOSE, CAPILLARY
Glucose-Capillary: 341 mg/dL — ABNORMAL HIGH (ref 70–99)
Glucose-Capillary: 215 mg/dL — ABNORMAL HIGH (ref 70–99)

## 2014-07-27 MED ORDER — METHIMAZOLE 5 MG PO TABS: 5.0000 mg | ORAL_TABLET | Freq: Two times a day (BID) | ORAL | Status: DC

## 2014-07-27 MED ORDER — INSULIN ASPART 100 UNIT/ML ~~LOC~~ SOLN
3.0000 [IU] | Freq: Three times a day (TID) | SUBCUTANEOUS | Status: DC
  Administered 2014-07-27 (×2): 3 [IU] via SUBCUTANEOUS

## 2014-07-27 MED ORDER — INSULIN ASPART 100 UNIT/ML ~~LOC~~ SOLN: 4.0000 [IU] | Freq: Three times a day (TID) | SUBCUTANEOUS | Status: DC

## 2014-07-27 MED ORDER — INSULIN GLARGINE 100 UNIT/ML ~~LOC~~ SOLN
20.0000 [IU] | Freq: Every day | SUBCUTANEOUS | Status: DC
  Filled 2014-07-27 (×2): qty 0.2

## 2014-07-27 MED ORDER — INSULIN GLARGINE 100 UNIT/ML ~~LOC~~ SOLN: 25.0000 [IU] | Freq: Every day | SUBCUTANEOUS | Status: DC

## 2014-07-27 NOTE — Progress Notes (Signed)
Patient discharged to home with sister. Vital signs stable at d/c. IV removed. Site clean, dry, no signs of infection. Prior to discharge patient stated that he is homeless, and had no insurance.  Upon further investigation it was discovered [facility], and was told by Child psychotherapistsocial worker that we not be able to help with meds, since he has insurance. Patient states that he has a "$400 deductible" and " will not be able to get medicine" and he will "fall out and die." Multiple suggestions were given to patient, including [facility], borrowing copay from family or friend, going to DSS, etc. Patient's sister was able to bring him back to home, which he says he has been evicted from. Patient upset that we were unable to provide any financial assistance. This nurse did best to assure patient that we did everything possible.

## 2014-07-27 NOTE — Discharge Summary (Addendum)
Physician Discharge Summary  Kenneth [name] NGE:[name] DOB: 05/08/1965 DOA: 07/24/2014  PCP: No PCP Per Patient  Admit date: 07/24/2014 Discharge date: 07/27/2014  Time spent: 40 minutes  Recommendations for Outpatient Follow-up:  1. Patient has been scheduled to follow up with Dr. Fransico HimNida for further management of Diabetes 2. He has been provided a list of primary care doctors to schedule follow up appointment  Discharge Diagnoses:  Active Problems:   DKA (diabetic ketoacidoses)   Dehydration   Acute renal failure   Hyperkalemia   Hyponatremia   Hyperthyroidism  severe protein calorie malnutrition  Discharge Condition: improved  Diet recommendation: low carb  Filed Weights   07/24/14 0808 07/24/14 1123 07/25/14 0500  Weight: 58.06 kg (128 lb) 54.6 kg (120 lb 5.9 oz) 56.5 kg (124 lb 9 oz)    History of present illness:  This patient presents to the hospital with persistent nausea and vomiting. He has a history of insulin-dependent diabetes. He reports running out of his testing supplies and was unable to check his blood sugar. He didn't take any insulin for several days prior to admission. He presented to the emergency room and was found to be in severe DKA with a pH of 6.9 and an anion gap of 33. He was admitted for further treatments.  Hospital Course:  Patient was admitted to stepdown unit and started on aggressive hydration as well as insulin infusion per DKA protocol. His anion gap closed and his dehydration resolved with IV fluids. He was seen by diabetes coordinator. Insulin was adjusted and blood sugars appear to be stable. His diabetic regimen will need further titration as an outpatient. He's been set up with local endocrinologist Dr. Fransico HimNida for further management. Patient's renal failure was related to dehydration and improved with IV fluids. Electrolytes have since been corrected as well. She appears to be back to his baseline level of functioning. He'll be discharged home  today to follow up with his outpatient providers.  Procedures:    Consultations:  Diabetes co ordinator  Discharge Exam: Filed Vitals:   07/27/14 1500  BP: 95/60  Pulse: 60  Temp: 98.3 F (36.8 C)  Resp: 18    General: NAD Cardiovascular: S1, s2 RRR Respiratory: CTA B  Discharge Instructions   Discharge Instructions    Diet - low sodium heart healthy    Complete by:  As directed      Increase activity slowly    Complete by:  As directed           Current Discharge Medication List    START taking these medications   Details  insulin glargine (LANTUS) 100 UNIT/ML injection Inject 0.25 mLs (25 Units total) into the skin at bedtime. Qty: 10 mL, Refills: 11      CONTINUE these medications which have CHANGED   Details  insulin aspart (NOVOLOG) 100 UNIT/ML injection Inject 4 Units into the skin 3 (three) times daily with meals. Qty: 10 mL, Refills: 11      CONTINUE these medications which have NOT CHANGED   Details  gabapentin (NEURONTIN) 300 MG capsule Take 600 mg by mouth 3 (three) times daily as needed (leg pain).     methimazole (TAPAZOLE) 5 MG tablet Take 5 mg by mouth 2 (two) times daily.    oxyCODONE-acetaminophen (PERCOCET/ROXICET) 5-325 MG per tablet Take 1-2 tablets by mouth every 6 (six) hours as needed for severe pain. Qty: 8 tablet, Refills: 0      STOP taking these medications  insulin NPH-regular Human (NOVOLIN 70/30) (70-30) 100 UNIT/ML injection      traMADol (ULTRAM) 50 MG tablet        No Known Allergies Follow-up Information    Follow up with NIDA,GEBRESELASSIE, MD On 07/30/2014.   Specialty:  Endocrinology   Why:  9am   Contact information:   [name] [place] [id number] [phone]        The results of significant diagnostics from this hospitalization (including imaging, microbiology, ancillary and laboratory) are listed below for reference.    Significant Diagnostic Studies: Dg Chest Portable 1  View  07/24/2014   CLINICAL DATA:  49 year old male with cough congestion chest pain and hyperglycemia. Initial encounter.  EXAM: PORTABLE CHEST - 1 VIEW  COMPARISON:  06/14/2013 and earlier.  FINDINGS: Portable AP upright view at 0834 hours. Stable lung volumes. Normal cardiac size and mediastinal contours. No pneumothorax, pleural effusion or pulmonary edema. Vague increased left mid lung opacity is felt to be artifactual (such as asymmetric grid placement). No confluent pulmonary opacity.  IMPRESSION: No acute cardiopulmonary abnormality; suspect artifactual increased opacity over the left mid lung.   Electronically Signed   By: Odessa FlemingH  Hall M.D.   On: 07/24/2014 08:46    Microbiology: Recent Results (from the past 240 hour(s))  MRSA PCR Screening     Status: None   Collection Time: 07/24/14 11:15 AM  Result Value Ref Range Status   MRSA by PCR NEGATIVE NEGATIVE Final    Comment:        The GeneXpert MRSA Assay (FDA approved for NASAL specimens only), is one component of a comprehensive MRSA colonization surveillance program. It is not intended to diagnose MRSA infection nor to guide or monitor treatment for MRSA infections.      Labs: Basic Metabolic Panel:  Recent Labs Lab 07/25/14 0022 07/25/14 0344 07/25/14 0747 07/26/14 0710 07/27/14 0555  NA 150* 150* 153* 148* 138  K 3.0* 3.0* 3.3* 2.8* 4.5  CL 121* 120* 115* 108 105  CO2 24 25 28  33* 30  GLUCOSE 53* 59* 70 35* 288*  BUN 38* 33* 31* 21* 19  CREATININE 1.20 1.10 1.22 0.81 0.75  CALCIUM 8.0* 7.9* 8.3* 8.5* 8.2*   Liver Function Tests:  Recent Labs Lab 07/24/14 0815  AST 14*  ALT 16*  ALKPHOS 97  BILITOT 1.8*  PROT 7.2  ALBUMIN 4.2   No results for input(s): LIPASE, AMYLASE in the last 168 hours. No results for input(s): AMMONIA in the last 168 hours. CBC:  Recent Labs Lab 07/24/14 0815 07/26/14 0710  WBC 21.7* 9.7  HGB 15.0 13.3  HCT 48.8 37.6*  MCV 103.6* 90.6  PLT 260 124*   Cardiac  Enzymes:  Recent Labs Lab 07/24/14 0815  TROPONINI <0.03   BNP: BNP (last 3 results) No results for input(s): BNP in the last 8760 hours.  ProBNP (last 3 results) No results for input(s): PROBNP in the last 8760 hours.  CBG:  Recent Labs Lab 07/26/14 1156 07/26/14 1709 07/26/14 2127 07/27/14 0738 07/27/14 1144  GLUCAP 195* 346* 211* 341* 215*       Signed:  MEMON,JEHANZEB  [facility] 07/27/2014, 3:27 PM

## 2014-09-21 ENCOUNTER — Emergency Department (HOSPITAL_COMMUNITY): Payer: 59

## 2014-09-21 ENCOUNTER — Encounter (HOSPITAL_COMMUNITY): Payer: Self-pay

## 2014-09-21 ENCOUNTER — Observation Stay (HOSPITAL_COMMUNITY)
Admission: EM | Admit: 2014-09-21 | Discharge: 2014-09-22 | Disposition: A | Discharge status: Home / Self Care | Payer: 59 | Attending: Internal Medicine | Admitting: Internal Medicine

## 2014-09-21 DIAGNOSIS — E871 Hypo-osmolality and hyponatremia: Secondary | ICD-10-CM | POA: Diagnosis not present

## 2014-09-21 DIAGNOSIS — E0800 Diabetes mellitus due to underlying condition with hyperosmolarity without nonketotic hyperglycemic-hyperosmolar coma (NKHHC): Secondary | ICD-10-CM | POA: Insufficient documentation

## 2014-09-21 DIAGNOSIS — Z91148 Patient's other noncompliance with medication regimen for other reason: Secondary | ICD-10-CM

## 2014-09-21 DIAGNOSIS — R739 Hyperglycemia, unspecified: Secondary | ICD-10-CM | POA: Insufficient documentation

## 2014-09-21 DIAGNOSIS — E079 Disorder of thyroid, unspecified: Secondary | ICD-10-CM | POA: Insufficient documentation

## 2014-09-21 DIAGNOSIS — Z9114 Patient's other noncompliance with medication regimen: Secondary | ICD-10-CM

## 2014-09-21 DIAGNOSIS — Z9119 Patient's noncompliance with other medical treatment and regimen: Secondary | ICD-10-CM | POA: Insufficient documentation

## 2014-09-21 DIAGNOSIS — Z79899 Other long term (current) drug therapy: Secondary | ICD-10-CM | POA: Insufficient documentation

## 2014-09-21 DIAGNOSIS — E1165 Type 2 diabetes mellitus with hyperglycemia: Principal | ICD-10-CM | POA: Insufficient documentation

## 2014-09-21 DIAGNOSIS — Z720 Tobacco use: Secondary | ICD-10-CM | POA: Insufficient documentation

## 2014-09-21 DIAGNOSIS — Z794 Long term (current) use of insulin: Secondary | ICD-10-CM | POA: Insufficient documentation

## 2014-09-21 DIAGNOSIS — E1100 Type 2 diabetes mellitus with hyperosmolarity without nonketotic hyperglycemic-hyperosmolar coma (NKHHC): Secondary | ICD-10-CM | POA: Diagnosis present

## 2014-09-21 DIAGNOSIS — M1990 Unspecified osteoarthritis, unspecified site: Secondary | ICD-10-CM | POA: Insufficient documentation

## 2014-09-21 DIAGNOSIS — E119 Type 2 diabetes mellitus without complications: Secondary | ICD-10-CM

## 2014-09-21 DIAGNOSIS — E46 Unspecified protein-calorie malnutrition: Secondary | ICD-10-CM | POA: Insufficient documentation

## 2014-09-21 HISTORY — DX: Patient's other noncompliance with medication regimen for other reason: Z91.148

## 2014-09-21 HISTORY — DX: Type 2 diabetes mellitus with ketoacidosis without coma: E11.10

## 2014-09-21 HISTORY — DX: Unspecified protein-calorie malnutrition: E46

## 2014-09-21 HISTORY — DX: Patient's other noncompliance with medication regimen: Z91.14

## 2014-09-21 LAB — COMPREHENSIVE METABOLIC PANEL
ALT: 10 U/L — ABNORMAL LOW (ref 17–63)
AST: 9 U/L — AB (ref 15–41)
Albumin: 3.8 g/dL (ref 3.5–5.0)
Alkaline Phosphatase: 77 U/L (ref 38–126)
Anion gap: 11 (ref 5–15)
BILIRUBIN TOTAL: 1.1 mg/dL (ref 0.3–1.2)
BUN: 17 mg/dL (ref 6–20)
CALCIUM: 8.3 mg/dL — AB (ref 8.9–10.3)
CHLORIDE: 97 mmol/L — AB (ref 101–111)
CO2: 22 mmol/L (ref 22–32)
Creatinine, Ser: 0.97 mg/dL (ref 0.61–1.24)
GFR calc Af Amer: >60 mL/min (ref >60)
GFR calc non Af Amer: >60 mL/min (ref >60)
Glucose, Bld: 670 mg/dL (ref 65–99)
Potassium: 5.1 mmol/L (ref 3.5–5.1)
Sodium: 130 mmol/L — ABNORMAL LOW (ref 135–145)
Total Protein: 6.5 g/dL (ref 6.5–8.1)

## 2014-09-21 LAB — CBC WITH DIFFERENTIAL/PLATELET
Basophils Absolute: 0.1 10*3/uL (ref 0.0–0.1)
Basophils Relative: 1 % (ref 0–1)
EOS PCT: 5 % (ref 0–5)
Eosinophils Absolute: 0.2 10*3/uL (ref 0.0–0.7)
HEMATOCRIT: 41.3 % (ref 39.0–52.0)
HEMOGLOBIN: 14.2 g/dL (ref 13.0–17.0)
LYMPHS ABS: 1.4 10*3/uL (ref 0.7–4.0)
LYMPHS PCT: 27 % (ref 12–46)
MCH: 31.7 pg (ref 26.0–34.0)
MCHC: 34.4 g/dL (ref 30.0–36.0)
MCV: 92.2 fL (ref 78.0–100.0)
MONO ABS: 0.5 10*3/uL (ref 0.1–1.0)
MONOS PCT: 9 % (ref 3–12)
NEUTROS ABS: 3.1 10*3/uL (ref 1.7–7.7)
Neutrophils Relative %: 58 % (ref 43–77)
Platelets: 171 10*3/uL (ref 150–400)
RBC: 4.48 MIL/uL (ref 4.22–5.81)
RDW: 12.9 % (ref 11.5–15.5)
WBC: 5.3 10*3/uL (ref 4.0–10.5)

## 2014-09-21 LAB — URINE MICROSCOPIC-ADD ON

## 2014-09-21 LAB — GLUCOSE, CAPILLARY: Glucose-Capillary: 318 mg/dL — ABNORMAL HIGH (ref 65–99)

## 2014-09-21 LAB — LIPASE, BLOOD: LIPASE: 24 U/L (ref 22–51)

## 2014-09-21 LAB — BLOOD GAS, ARTERIAL
Acid-base deficit: 4.9 mmol/L — ABNORMAL HIGH (ref 0.0–2.0)
Bicarbonate: 20.4 mEq/L (ref 20.0–24.0)
Drawn by: 105551
FIO2: 0.21 %
O2 Saturation: 67.5 %
PATIENT TEMPERATURE: 37
TCO2: 18.6 mmol/L (ref 0–100)
pCO2 arterial: 42.5 mmHg (ref 35.0–45.0)
pH, Arterial: 7.303 — ABNORMAL LOW (ref 7.350–7.450)
pO2, Arterial: 36.3 mmHg — CL (ref 80.0–100.0)

## 2014-09-21 LAB — CBG MONITORING, ED
GLUCOSE-CAPILLARY: 545 mg/dL — AB (ref 65–99)
Glucose-Capillary: 470 mg/dL — ABNORMAL HIGH (ref 65–99)
Glucose-Capillary: 391 mg/dL — ABNORMAL HIGH (ref 65–99)

## 2014-09-21 LAB — URINALYSIS, ROUTINE W REFLEX MICROSCOPIC
Bilirubin Urine: NEGATIVE
Glucose, UA: >1000 mg/dL — AB
Hgb urine dipstick: NEGATIVE
KETONES UR: 40 mg/dL — AB
Leukocytes, UA: NEGATIVE
NITRITE: NEGATIVE
Protein, ur: NEGATIVE mg/dL
Specific Gravity, Urine: 1.015 (ref 1.005–1.030)
Urobilinogen, UA: 0.2 mg/dL (ref 0.0–1.0)
pH: 5.5 (ref 5.0–8.0)

## 2014-09-21 LAB — ETHANOL

## 2014-09-21 MED ORDER — GABAPENTIN 300 MG PO CAPS: 600.0000 mg | ORAL_CAPSULE | Freq: Three times a day (TID) | ORAL | Status: DC | PRN

## 2014-09-21 MED ORDER — SODIUM CHLORIDE 0.9 % IV SOLN: INTRAVENOUS | Status: DC

## 2014-09-21 MED ORDER — SODIUM CHLORIDE 0.9 % IV SOLN
INTRAVENOUS | Status: DC
  Administered 2014-09-21: via INTRAVENOUS

## 2014-09-21 MED ORDER — SODIUM CHLORIDE 0.9 % IV SOLN
INTRAVENOUS | Status: DC
Start: 2014-09-21 — End: 2014-09-21

## 2014-09-21 MED ORDER — INSULIN REGULAR BOLUS VIA INFUSION: 0.0000 [IU] | Freq: Three times a day (TID) | INTRAVENOUS | Status: DC

## 2014-09-21 MED ORDER — ONDANSETRON HCL 4 MG/2ML IJ SOLN: 4.0000 mg | Freq: Four times a day (QID) | INTRAMUSCULAR | Status: DC | PRN

## 2014-09-21 MED ORDER — DEXTROSE-NACL 5-0.45 % IV SOLN: INTRAVENOUS | Status: DC

## 2014-09-21 MED ORDER — INSULIN GLARGINE 100 UNIT/ML ~~LOC~~ SOLN
25.0000 [IU] | Freq: Every day | SUBCUTANEOUS | Status: DC
  Administered 2014-09-21: 25 [IU] via SUBCUTANEOUS
  Filled 2014-09-21 (×2): qty 0.25

## 2014-09-21 MED ORDER — SODIUM CHLORIDE 0.9 % IV BOLUS (SEPSIS)
1000.0000 mL | Freq: Once | INTRAVENOUS | Status: AC
  Administered 2014-09-21: 1000 mL via INTRAVENOUS

## 2014-09-21 MED ORDER — INSULIN ASPART 100 UNIT/ML ~~LOC~~ SOLN
0.0000 [IU] | Freq: Three times a day (TID) | SUBCUTANEOUS | Status: DC
  Administered 2014-09-22: 5 [IU] via SUBCUTANEOUS
  Administered 2014-09-22: 3 [IU] via SUBCUTANEOUS

## 2014-09-21 MED ORDER — INSULIN ASPART 100 UNIT/ML ~~LOC~~ SOLN
10.0000 [IU] | Freq: Once | SUBCUTANEOUS | Status: AC
  Administered 2014-09-21: 10 [IU] via SUBCUTANEOUS
  Filled 2014-09-21: qty 1

## 2014-09-21 MED ORDER — ONDANSETRON HCL 4 MG PO TABS: 4.0000 mg | ORAL_TABLET | Freq: Four times a day (QID) | ORAL | Status: DC | PRN

## 2014-09-21 MED ORDER — SODIUM CHLORIDE 0.9 % IV BOLUS (SEPSIS)
2000.0000 mL | Freq: Once | INTRAVENOUS | Status: AC
  Administered 2014-09-21: 2000 mL via INTRAVENOUS

## 2014-09-21 MED ORDER — DEXTROSE 50 % IV SOLN: 25.0000 mL | INTRAVENOUS | Status: DC | PRN

## 2014-09-21 NOTE — ED Notes (Signed)
Patient states that he is in pain all the time. States that he has tingling and numbness in hands and feet. States that he can not get his diabetic medicine.

## 2014-09-21 NOTE — ED Notes (Signed)
Pt states he is homeless and has been out of his insulin for months. Per EMS, CBG read high

## 2014-09-21 NOTE — ED Notes (Signed)
CRITICAL VALUE ALERT  Critical value received:  glucose  Date of notification:  09/21/2014  Time of notification:  1920  Critical value read back:yes  Nurse who received alert:  Bennetta LaosPenny Alanys Godino,rn  MD notified (1st page):    Time of first page:    MD notified (2nd page):  Time of second page:  Responding MD: [email or web address]  Time MD responded:  [phone]

## 2014-09-21 NOTE — Progress Notes (Signed)
AB[facility], sample most likely mixed venous. Ok for release of gas.

## 2014-09-21 NOTE — ED Provider Notes (Signed)
CSN: [id number]     Arrival date & time 09/21/14  1811 History   First MD Initiated Contact with Patient 09/21/14 1817     Chief Complaint  Patient presents with  . Hyperglycemia      HPI Pt was seen at 1825. Per EMS and pt report, c/o unknown onset and persistence of constant generalized weakness/fatigue for the past month, worse over the past several days. Has been associated with N/V, increased urination and thirst. Pt states he has been out of his insulin for the past 1+ month. Pt states "I'm sure my sugar's high again." EMS states pt's CBG read "high" en route. Pt denies CP/palpitations, no SOB/cough, no abd pain, no diarrhea, no black or blood in stools or emesis, no back pain, no fevers, no focal motor weakness, no tingling/numbness in extremities.    Past Medical History  Diagnosis Date  . Diabetes mellitus   . Arthritis   . Thyroid disease   . DKA (diabetic ketoacidosis) 07/2014  . Noncompliance with medication regimen   . Malnutrition    History reviewed. No pertinent past surgical history.  History  Substance Use Topics  . Smoking status: Current Every Day Smoker  . Smokeless tobacco: Not on file  . Alcohol Use: No    Review of Systems ROS: Statement: All systems negative except as marked or noted in the HPI; Constitutional: Negative for fever and chills. +generalized weakness/fatigue. ; ; Eyes: Negative for eye pain, redness and discharge. ; ; ENMT: Negative for ear pain, hoarseness, nasal congestion, sinus pressure and sore throat. ; ; Cardiovascular: Negative for chest pain, palpitations, diaphoresis, dyspnea and peripheral edema. ; ; Respiratory: Negative for cough, wheezing and stridor. ; ; Gastrointestinal: +N/V. Negative for diarrhea, abdominal pain, blood in stool, hematemesis, jaundice and rectal bleeding. . ; ; Genitourinary: Negative for dysuria, flank pain and hematuria. ; ; Musculoskeletal: Negative for back pain and neck pain. Negative for swelling and trauma.;  ; Skin: Negative for pruritus, rash, abrasions, blisters, bruising and skin lesion.; ; Neuro: Negative for headache, lightheadedness and neck stiffness. Negative for altered level of consciousness , altered mental status, extremity weakness, paresthesias, involuntary movement, seizure and syncope.      Allergies  Review of patient's allergies indicates no known allergies.  Home Medications   Prior to Admission medications   Medication Sig Start Date End Date Taking? Authorizing Provider  gabapentin (NEURONTIN) 300 MG capsule Take 600 mg by mouth 3 (three) times daily as needed (leg pain).     Historical Provider, MD  insulin aspart (NOVOLOG) 100 UNIT/ML injection Inject 4 Units into the skin 3 (three) times daily with meals. 07/27/14   Erick Blinks, MD  insulin glargine (LANTUS) 100 UNIT/ML injection Inject 0.25 mLs (25 Units total) into the skin at bedtime. 07/27/14   Erick Blinks, MD  methimazole (TAPAZOLE) 5 MG tablet Take 1 tablet (5 mg total) by mouth 2 (two) times daily. 07/27/14   Erick Blinks, MD  oxyCODONE-acetaminophen (PERCOCET/ROXICET) 5-325 MG per tablet Take 1-2 tablets by mouth every 6 (six) hours as needed for severe pain. Patient not taking: Reported on 07/24/2014 06/14/13   Benjiman Core, MD   BP 103/66 mmHg  Pulse 61  Temp(Src) 97.7 F (36.5 C) (Oral)  Resp 18  Ht 5\' 8"  (1.727 m)  Wt 120 lb (54.432 kg)  BMI 18.25 kg/m2  SpO2 100% Physical Exam  1830: Physical examination:  Nursing notes reviewed; Vital signs and O2 SAT reviewed;  Constitutional: Thin, In no acute  distress; Head:  Normocephalic, atraumatic; Eyes: EOMI, PERRL, No scleral icterus; ENMT: Mouth and pharynx normal, Mucous membranes dry; Neck: Supple, Full range of motion, No lymphadenopathy; Cardiovascular: Regular rate and rhythm, No gallop; Respiratory: Breath sounds clear & equal bilaterally, No wheezes.  Speaking full sentences with ease, Normal respiratory effort/excursion; Chest: Nontender, Movement  normal; Abdomen: Soft, Nontender, Nondistended, Normal bowel sounds; Genitourinary: No CVA tenderness; Extremities: Pulses normal, No tenderness, No edema, No calf edema or asymmetry.; Neuro: AA&Ox3, Major CN grossly intact.  Speech clear. No gross focal motor or sensory deficits in extremities.; Skin: Color normal, Warm, Dry.; Psych:  Easily agitated and argumentative, poor eye contact.    ED Course  Procedures      EKG Interpretation None      MDM  MDM Reviewed: previous chart, nursing note and vitals Reviewed previous: labs Interpretation: labs and x-ray Total time providing critical care: 30-74 minutes. This excludes time spent performing separately reportable procedures and services. Consults: admitting MD      CRITICAL CARE Performed by: Laray Anger Total critical care time: 35 Critical care time was exclusive of separately billable procedures and treating other patients. Critical care was necessary to treat or prevent imminent or life-threatening deterioration. Critical care was time spent personally by me on the following activities: development of treatment plan with patient and/or surrogate as well as nursing, discussions with consultants, evaluation of patient's response to treatment, examination of patient, obtaining history from patient or surrogate, ordering and performing treatments and interventions, ordering and review of laboratory studies, ordering and review of radiographic studies, pulse oximetry and re-evaluation of patient's condition.  Results for orders placed or performed during the hospital encounter of 09/21/14  Comprehensive metabolic panel  Result Value Ref Range   Sodium 130 (L) 135 - 145 mmol/L   Potassium 5.1 3.5 - 5.1 mmol/L   Chloride 97 (L) 101 - 111 mmol/L   CO2 22 22 - 32 mmol/L   Glucose, Bld 670 (HH) 65 - 99 mg/dL   BUN 17 6 - 20 mg/dL   Creatinine, Ser 1.61 0.61 - 1.24 mg/dL   Calcium 8.3 (L) 8.9 - 10.3 mg/dL   Total Protein  6.5 6.5 - 8.1 g/dL   Albumin 3.8 3.5 - 5.0 g/dL   AST 9 (L) 15 - 41 U/L   ALT 10 (L) 17 - 63 U/L   Alkaline Phosphatase 77 38 - 126 U/L   Total Bilirubin 1.1 0.3 - 1.2 mg/dL   GFR calc non Af Amer >60 >60 mL/min   GFR calc Af Amer >60 >60 mL/min   Anion gap 11 5 - 15  Ethanol  Result Value Ref Range   Alcohol, Ethyl (B) <5 <5 mg/dL  Lipase, blood  Result Value Ref Range   Lipase 24 22 - 51 U/L  CBC with Differential  Result Value Ref Range   WBC 5.3 4.0 - 10.5 K/uL   RBC 4.48 4.22 - 5.81 MIL/uL   Hemoglobin 14.2 13.0 - 17.0 g/dL   HCT 09.6 04.5 - 40.9 %   MCV 92.2 78.0 - 100.0 fL   MCH 31.7 26.0 - 34.0 pg   MCHC 34.4 30.0 - 36.0 g/dL   RDW 81.1 91.4 - 78.2 %   Platelets 171 150 - 400 K/uL   Neutrophils Relative % 58 43 - 77 %   Neutro Abs 3.1 1.7 - 7.7 K/uL   Lymphocytes Relative 27 12 - 46 %   Lymphs Abs 1.4 0.7 - 4.0 K/uL  Monocytes Relative 9 3 - 12 %   Monocytes Absolute 0.5 0.1 - 1.0 K/uL   Eosinophils Relative 5 0 - 5 %   Eosinophils Absolute 0.2 0.0 - 0.7 K/uL   Basophils Relative 1 0 - 1 %   Basophils Absolute 0.1 0.0 - 0.1 K/uL  Urinalysis, Routine w reflex microscopic ([facility])  Result Value Ref Range   Color, Urine YELLOW YELLOW   APPearance CLEAR CLEAR   Specific Gravity, Urine 1.015 1.005 - 1.030   pH 5.5 5.0 - 8.0   Glucose, UA >1000 (A) NEGATIVE mg/dL   Hgb urine dipstick NEGATIVE NEGATIVE   Bilirubin Urine NEGATIVE NEGATIVE   Ketones, ur 40 (A) NEGATIVE mg/dL   Protein, ur NEGATIVE NEGATIVE mg/dL   Urobilinogen, UA 0.2 0.0 - 1.0 mg/dL   Nitrite NEGATIVE NEGATIVE   Leukocytes, UA NEGATIVE NEGATIVE  Blood gas, arterial ([facility] & [facility] ONLY)  Result Value Ref Range   FIO2 0.21 %   Delivery systems ROOM AIR    pH, Arterial 7.303 (L) 7.350 - 7.450   pCO2 arterial 42.5 35.0 - 45.0 mmHg   pO2, Arterial 36.3 (LL) 80.0 - 100.0 mmHg   Bicarbonate 20.4 20.0 - 24.0 mEq/L   TCO2 18.6 0 - 100 mmol/L   Acid-base deficit 4.9 (H) 0.0 - 2.0 mmol/L   O2  Saturation 67.5 %   Patient temperature 37.0    Collection site BRACHIAL ARTERY    Drawn by [phone]    Sample type MIXED VENOUS SAMPLE    Allens test (pass/fail) NOT INDICATED (A) PASS  Urine microscopic-add on  Result Value Ref Range   RBC / HPF 0-2 <3 RBC/hpf  CBG monitoring, ED  Result Value Ref Range   Glucose-Capillary 545 (H) 65 - 99 mg/dL   Dg Chest Port 1 View 09/21/2014   CLINICAL DATA:  Weakness for 1 week.  EXAM: PORTABLE CHEST - 1 VIEW  COMPARISON:  Jul 24, 2014  FINDINGS: The heart size and mediastinal contours are within normal limits. There is no focal infiltrate, pulmonary edema, or pleural effusion. The visualized skeletal structures are unremarkable.  IMPRESSION: No active cardiopulmonary disease.   Electronically Signed   By: Sherian ReinWei-Chen  Lin M.D.   On: 09/21/2014 19:03    1930:  IVF NS bolus given on pt's arrival for elevated CBG and hypotension. Glucose on CMP 670 and Na corrects to 144 for glucose. ABG mixed sample, but pH is acidotic. IV insulin gtt started. Dx and testing d/w pt.  Questions answered.  Verb understanding, agreeable to admit. T/C to [facility] Dr. Onalee Huaavid, case discussed, including:  HPI, pertinent PM/SHx, VS/PE, dx testing, ED course and treatment:  Agreeable to admit, requests to d/c insulin gtt and dose SQ regular insulin 10 units after re-check CBG; she will come to ED for eval.    Samuel JesterKathleen Lynda Wanninger, DO 09/23/14 2054

## 2014-09-21 NOTE — H&P (Signed)
PCP:   No PCP Per Patient   Chief Complaint:  High glucose, not feeling well  HPI: 49 yo male IDDM, homeless comes in with not feeling well.  Cannot afford his insulin.  Has applied for medicaid but has gotten denied.  Is homeless.  Has had no insulin for a month.  Glucose on arrival was over 600 but not in DKA.  Pt frustrated because he got denied for medicaid.  Review of Systems:  Positive and negative as per HPI otherwise all other systems are negative  Past Medical History: Past Medical History  Diagnosis Date  . Diabetes mellitus   . Arthritis   . Thyroid disease   . DKA (diabetic ketoacidosis) 07/2014  . Noncompliance with medication regimen   . Malnutrition    History reviewed. No pertinent past surgical history.  Medications: Prior to Admission medications   Medication Sig Start Date End Date Taking? Authorizing Provider  insulin aspart (NOVOLOG) 100 UNIT/ML injection Inject 4 Units into the skin 3 (three) times daily with meals. Patient taking differently: Inject 4-10 Units into the skin 3 (three) times daily as needed for high blood sugar.  07/27/14  Yes Erick Blinks, MD  gabapentin (NEURONTIN) 300 MG capsule Take 600 mg by mouth 3 (three) times daily as needed (leg pain).     Historical Provider, MD  insulin glargine (LANTUS) 100 UNIT/ML injection Inject 0.25 mLs (25 Units total) into the skin at bedtime. 07/27/14   Erick Blinks, MD  methimazole (TAPAZOLE) 5 MG tablet Take 1 tablet (5 mg total) by mouth 2 (two) times daily. 07/27/14   Erick Blinks, MD  oxyCODONE-acetaminophen (PERCOCET/ROXICET) 5-325 MG per tablet Take 1-2 tablets by mouth every 6 (six) hours as needed for severe pain. Patient not taking: Reported on 07/24/2014 06/14/13   Benjiman Core, MD    Allergies:  No Known Allergies  Social History:  reports that he has been smoking.  He does not have any smokeless tobacco history on file. He reports that he does not drink alcohol or use illicit  drugs.  Family History: No diabetes  Physical Exam: Filed Vitals:   09/21/14 1814  BP: 103/66  Pulse: 61  Temp: 97.7 F (36.5 C)  TempSrc: Oral  Resp: 18  Height:  (1.727 m)  Weight: 54.432 kg (120 lb)  SpO2: 100%   General appearance: alert, cooperative and no distress Head: Normocephalic, without obvious abnormality, atraumatic Eyes: negative Nose: Nares normal. Septum midline. Mucosa normal. No drainage or sinus tenderness. Neck: no JVD and supple, symmetrical, trachea midline Lungs: clear to auscultation bilaterally Heart: regular rate and rhythm, S1, S2 normal, no murmur, click, rub or gallop Abdomen: soft, non-tender; bowel sounds normal; no masses,  no organomegaly Extremities: extremities normal, atraumatic, no cyanosis or edema Pulses: 2+ and symmetric Skin: Skin color, texture, turgor normal. No rashes or lesions Neurologic: Grossly normal    Labs on Admission:   Recent Labs  09/21/14 1839  NA 130*  K 5.1  CL 97*  CO2 22  GLUCOSE 670*  BUN 17  CREATININE 0.97  CALCIUM 8.3*    Recent Labs  09/21/14 1839  AST 9*  ALT 10*  ALKPHOS 77  BILITOT 1.1  PROT 6.5  ALBUMIN 3.8    Recent Labs  09/21/14 1839  LIPASE 24    Recent Labs  09/21/14 1839  WBC 5.3  NEUTROABS 3.1  HGB 14.2  HCT 41.3  MCV 92.2  PLT 171    Radiological Exams on Admission: Dg  Chest Port 1 View  09/21/2014   CLINICAL DATA:  Weakness for 1 week.  EXAM: PORTABLE CHEST - 1 VIEW  COMPARISON:  Jul 24, 2014  FINDINGS: The heart size and mediastinal contours are within normal limits. There is no focal infiltrate, pulmonary edema, or pleural effusion. The visualized skeletal structures are unremarkable.  IMPRESSION: No active cardiopulmonary disease.   Electronically Signed   By: Sherian ReinWei-Chen  Lin M.D.   On: 09/21/2014 19:03   cxr reviewed no edema or infiltrate Old records reviewed Case discussed with edp dr mcmannus  Assessment/Plan  49 yo homeless male h/o IDDM with  HONK due to social issues not able to afford insulin  Principal Problem:   Diabetes mellitus with nonketotic hyperosmolarity-  Ivf, give insulin.  Does not need insulin drip.  Gap is 11.  Difficult social situation.  obs on medical.  afvss.  Active Problems:   Hyponatremia-  Corrects to nml   Noncompliance with medication regimen   DM (diabetes mellitus) noted  obs on medical.  Consult SW but dont know what else we can do for him concerning his home meds.  Aksel Bencomo A 09/21/2014, 7:38 PM

## 2014-09-22 DIAGNOSIS — E871 Hypo-osmolality and hyponatremia: Secondary | ICD-10-CM | POA: Diagnosis not present

## 2014-09-22 DIAGNOSIS — Z9114 Patient's other noncompliance with medication regimen: Secondary | ICD-10-CM | POA: Diagnosis not present

## 2014-09-22 DIAGNOSIS — E1100 Type 2 diabetes mellitus with hyperosmolarity without nonketotic hyperglycemic-hyperosmolar coma (NKHHC): Secondary | ICD-10-CM | POA: Diagnosis not present

## 2014-09-22 DIAGNOSIS — R739 Hyperglycemia, unspecified: Secondary | ICD-10-CM | POA: Diagnosis not present

## 2014-09-22 LAB — BASIC METABOLIC PANEL
Anion gap: 4 — ABNORMAL LOW (ref 5–15)
BUN: 11 mg/dL (ref 6–20)
CO2: 25 mmol/L (ref 22–32)
Calcium: 8.2 mg/dL — ABNORMAL LOW (ref 8.9–10.3)
Chloride: 112 mmol/L — ABNORMAL HIGH (ref 101–111)
Creatinine, Ser: 0.64 mg/dL (ref 0.61–1.24)
GFR calc non Af Amer: >60 mL/min (ref >60)
GLUCOSE: 156 mg/dL — AB (ref 65–99)
POTASSIUM: 3.6 mmol/L (ref 3.5–5.1)
SODIUM: 141 mmol/L (ref 135–145)

## 2014-09-22 LAB — CBC
HEMATOCRIT: 37.3 % — AB (ref 39.0–52.0)
Hemoglobin: 12.8 g/dL — ABNORMAL LOW (ref 13.0–17.0)
MCH: 31.4 pg (ref 26.0–34.0)
MCHC: 34.3 g/dL (ref 30.0–36.0)
MCV: 91.6 fL (ref 78.0–100.0)
PLATELETS: 173 10*3/uL (ref 150–400)
RBC: 4.07 MIL/uL — ABNORMAL LOW (ref 4.22–5.81)
RDW: 13.1 % (ref 11.5–15.5)
WBC: 5.9 10*3/uL (ref 4.0–10.5)

## 2014-09-22 LAB — GLUCOSE, CAPILLARY
Glucose-Capillary: 104 mg/dL — ABNORMAL HIGH (ref 65–99)
Glucose-Capillary: 276 mg/dL — ABNORMAL HIGH (ref 65–99)
Glucose-Capillary: 246 mg/dL — ABNORMAL HIGH (ref 65–99)

## 2014-09-22 MED ORDER — METHIMAZOLE 5 MG PO TABS: 5.0000 mg | ORAL_TABLET | Freq: Two times a day (BID) | ORAL | Status: DC

## 2014-09-22 MED ORDER — GABAPENTIN 300 MG PO CAPS: 600.0000 mg | ORAL_CAPSULE | Freq: Three times a day (TID) | ORAL | Status: DC | PRN

## 2014-09-22 MED ORDER — INSULIN ASPART PROT & ASPART (70-30 MIX) 100 UNIT/ML ~~LOC~~ SUSP
10.0000 [IU] | Freq: Two times a day (BID) | SUBCUTANEOUS | Status: DC
  Administered 2014-09-22: 10 [IU] via SUBCUTANEOUS
  Filled 2014-09-22: qty 10

## 2014-09-22 MED ORDER — INSULIN ASPART PROT & ASPART (70-30 MIX) 100 UNIT/ML ~~LOC~~ SUSP: SUBCUTANEOUS | Status: DC

## 2014-09-22 MED ORDER — BLOOD GLUCOSE MONITOR KIT: PACK | Status: DC

## 2014-09-22 MED ORDER — "INSULIN SYRINGE-NEEDLE U-100 30G X 1/2"" 0.5 ML MISC": Status: DC

## 2014-09-22 NOTE — Discharge Instructions (Signed)

## 2014-09-22 NOTE — Progress Notes (Signed)
Patient left the floor un escorted with family.  I do not know when he left.

## 2014-09-22 NOTE — Discharge Summary (Signed)
Physician Discharge Summary  Kenneth [name] UEK:[id number] DOB: 09-19-1965 DOA: 09/21/2014  PCP: No PCP Per Patient  Admit date: 09/21/2014 Discharge date: 09/22/2014  Time spent: 40 minutes  Recommendations for Outpatient Follow-up:  1. Follow up at the health department for further care 2. Patient received MATCH voucher to obtain medications  Discharge Diagnoses:  Principal Problem:   Diabetes mellitus with nonketotic hyperosmolarity Active Problems:   Hyponatremia   Noncompliance with medication regimen   DM (diabetes mellitus)   Diabetes mellitus   Hyperglycemia   Discharge Condition: improved  Diet recommendation: low carb, low salt  Filed Weights   09/21/14 1814 09/21/14 2155 09/22/14 0646  Weight: 54.432 kg (120 lb) 55.566 kg (122 lb 8 oz) 55.6 kg (122 lb 9.2 oz)    History of present illness:  This patient presented to the hospital with complaints of not feeling well and was found to be hypoglycemic with a blood sugar greater than 600. He did not have any evidence of diabetic ketoacidosis. He was admitted overnight for observation.  Hospital Course:  Patient was started on intravenous fluid and received a dose of Lantus insulin. Blood sugars have since improved. He is adequately hydrated. Hyponatremia has resolved. He received a MATCH voucher in order to obtain his medications. Lantus insulin was changed to NovoLog 70/30 since it is more affordable. Patient was otherwise stable condition. He is appropriate for discharge home.  Procedures:    Consultations:    Discharge Exam: Filed Vitals:   09/22/14 1543  BP: 103/64  Pulse: 65  Temp: 98.1 F (36.7 C)  Resp: 20    General: NAD Cardiovascular: S1, S2 RRR Respiratory: CTA B  Discharge Instructions   Discharge Instructions    Diet - low sodium heart healthy    Complete by:  As directed      Diet Carb Modified    Complete by:  As directed      Increase activity slowly    Complete by:  As directed           Current Discharge Medication List    START taking these medications   Details  blood glucose meter kit and supplies KIT Dispense based on patient and insurance preference. Use up to four times daily as directed. (FOR ICD-9 250.00, 250.01). Qty: 1 each, Refills: 0    insulin aspart protamine- aspart (NOVOLOG MIX 70/30) (70-30) 100 UNIT/ML injection Take 15units [name] qam with breakfast and 10units [name] qpm with dinner Qty: 10 mL, Refills: 11    Insulin Syringe-Needle U-100 30G X 1/2" 0.5 ML MISC Use to inject insulin subcutaneously BID Qty: 100 each, Refills: 0      CONTINUE these medications which have CHANGED   Details  gabapentin (NEURONTIN) 300 MG capsule Take 2 capsules (600 mg total) by mouth 3 (three) times daily as needed (leg pain). Qty: 180 capsule, Refills: 1    methimazole (TAPAZOLE) 5 MG tablet Take 1 tablet (5 mg total) by mouth 2 (two) times daily. Qty: 60 tablet, Refills: 1      STOP taking these medications     insulin aspart (NOVOLOG) 100 UNIT/ML injection      insulin glargine (LANTUS) 100 UNIT/ML injection      oxyCODONE-acetaminophen (PERCOCET/ROXICET) 5-325 MG per tablet        No Known Allergies Follow-up Information    Follow up with follow up at the health department in 2 weeks.       The results of significant diagnostics from this hospitalization (  including imaging, microbiology, ancillary and laboratory) are listed below for reference.    Significant Diagnostic Studies: Dg Chest Port 1 View  09/21/2014   CLINICAL DATA:  Weakness for 1 week.  EXAM: PORTABLE CHEST - 1 VIEW  COMPARISON:  Jul 24, 2014  FINDINGS: The heart size and mediastinal contours are within normal limits. There is no focal infiltrate, pulmonary edema, or pleural effusion. The visualized skeletal structures are unremarkable.  IMPRESSION: No active cardiopulmonary disease.   Electronically Signed   By: Abelardo Diesel M.D.   On: 09/21/2014 19:03    Microbiology: No results  found for this or any previous visit (from the past 240 hour(s)).   Labs: Basic Metabolic Panel:  Recent Labs Lab 09/21/14 1839 09/22/14 0601  NA 130* 141  K 5.1 3.6  CL 97* 112*  CO2 22 25  GLUCOSE 670* 156*  BUN 17 11  CREATININE 0.97 0.64  CALCIUM 8.3* 8.2*   Liver Function Tests:  Recent Labs Lab 09/21/14 1839  AST 9*  ALT 10*  ALKPHOS 77  BILITOT 1.1  PROT 6.5  ALBUMIN 3.8    Recent Labs Lab 09/21/14 1839  LIPASE 24   No results for input(s): AMMONIA in the last 168 hours. CBC:  Recent Labs Lab 09/21/14 1839 09/22/14 0601  WBC 5.3 5.9  NEUTROABS 3.1  --   HGB 14.2 12.8*  HCT 41.3 37.3*  MCV 92.2 91.6  PLT 171 173   Cardiac Enzymes: No results for input(s): CKTOTAL, CKMB, CKMBINDEX, TROPONINI in the last 168 hours. BNP: BNP (last 3 results) No results for input(s): BNP in the last 8760 hours.  ProBNP (last 3 results) No results for input(s): PROBNP in the last 8760 hours.  CBG:  Recent Labs Lab 09/21/14 2028 09/21/14 2137 09/21/14 2221 09/22/14 0730 09/22/14 1203  GLUCAP 470* 391* 318* 104* 276*       Signed:  MEMON,JEHANZEB  [facility] 09/22/2014, 4:18 PM

## 2014-09-22 NOTE — Care Management Note (Signed)
Case Management Note  Patient Details  Name: Kenneth [name] MRN: [id number] Date of Birth: 04/21/1965  Subjective/Objective:        Patient presented to [facility] ED with elevated blood sugars Action/Plan[facility]    Expected Discharge Date:      09/22/14            [facility]      [facility]  In-House Referral:     Discharge planning Services    [facility] Post Acute Care Choice:    Choice offered to:   patient  DME Arranged:    DME Agency:     HH Arranged:    HH Agency:     Status of Service:   completed  Medicare Important Message Given:    Date Medicare IM Given:    Medicare IM give by:    Date Additional Medicare IM Given:    Additional Medicare Important Message give by:     If discussed at Long Length of Stay Meetings, dates discussed:    Additional Comments: ED CM received call from Inpatient CM regarding Medication Assistance. ED CM reviewed chart and spoken with patient, patient confirmed information, patient states, that he is no longer insured [facility] has not paid a premium in over a year due to unemployment and homelessness. Discussed MATCH program and the guidelines patient states, he unable to pay $3 co-pays  Per prescription. Patient enrolled in program, letter printed and given to patient. He was made aware that we can the waive the co-pay. Patient very appreciative, patient made aware of the [facility] [place] for [place] up, patient was quite away of the clinic states, he has established care with them in the past, he was instructed to return there patient was amendable. Updated Tamika RN. on on AP300. No further CM needs identified  Michel BickersROGERS, Sly Parlee, RN 09/22/2014, 5:37 PM

## 2014-09-22 NOTE — Progress Notes (Signed)
At discharge the patient expressed that he one needed medication for pain/tingling in his feet.    I voiced to the patient that the MD ordered Neurontin that would possibly cover the pain/tingling in his feet.  Pt still states that he still wants meds.he requested Percocet. He voices that he and the MD discussed him getting pain medication.  Notified Dr. Roderic Palau of the patients request for pain medication due to pain in his legs and feet.  MD stated that he did not say he would order the patient percocet and would not write an order.  Discussed this with the patient and he was very agitated.  At this time I have him ready to be discharged he has received his instructions, match voucher, diabetic kit, and prescriptions.  He verbalized understanding.  He stated that he was waiting on a ride.  Visitors came in and I asked them if they were there to transport him home.  They said they would.  Again the patient became very agitated stating that we were trying to rush him out.

## 2014-09-24 LAB — GLUCOSE, CAPILLARY: Glucose-Capillary: 571 mg/dL (ref 65–99)

## 2014-12-16 ENCOUNTER — Observation Stay (HOSPITAL_COMMUNITY)
Admission: EM | Admit: 2014-12-16 | Discharge: 2014-12-17 | Disposition: A | Discharge status: Home / Self Care | Payer: 59 | Attending: Internal Medicine | Admitting: Internal Medicine

## 2014-12-16 ENCOUNTER — Encounter (HOSPITAL_COMMUNITY): Payer: Self-pay | Admitting: Emergency Medicine

## 2014-12-16 DIAGNOSIS — E0800 Diabetes mellitus due to underlying condition with hyperosmolarity without nonketotic hyperglycemic-hyperosmolar coma (NKHHC): Secondary | ICD-10-CM

## 2014-12-16 DIAGNOSIS — E0590 Thyrotoxicosis, unspecified without thyrotoxic crisis or storm: Secondary | ICD-10-CM | POA: Diagnosis present

## 2014-12-16 DIAGNOSIS — Z9114 Patient's other noncompliance with medication regimen: Secondary | ICD-10-CM | POA: Insufficient documentation

## 2014-12-16 DIAGNOSIS — R739 Hyperglycemia, unspecified: Secondary | ICD-10-CM

## 2014-12-16 DIAGNOSIS — E1110 Type 2 diabetes mellitus with ketoacidosis without coma: Secondary | ICD-10-CM | POA: Diagnosis present

## 2014-12-16 DIAGNOSIS — F17210 Nicotine dependence, cigarettes, uncomplicated: Secondary | ICD-10-CM | POA: Insufficient documentation

## 2014-12-16 DIAGNOSIS — Z23 Encounter for immunization: Secondary | ICD-10-CM | POA: Insufficient documentation

## 2014-12-16 DIAGNOSIS — E1010 Type 1 diabetes mellitus with ketoacidosis without coma: Principal | ICD-10-CM | POA: Insufficient documentation

## 2014-12-16 DIAGNOSIS — Z794 Long term (current) use of insulin: Secondary | ICD-10-CM | POA: Insufficient documentation

## 2014-12-16 DIAGNOSIS — E1310 Other specified diabetes mellitus with ketoacidosis without coma: Secondary | ICD-10-CM | POA: Diagnosis not present

## 2014-12-16 DIAGNOSIS — E119 Type 2 diabetes mellitus without complications: Secondary | ICD-10-CM

## 2014-12-16 DIAGNOSIS — M1990 Unspecified osteoarthritis, unspecified site: Secondary | ICD-10-CM | POA: Insufficient documentation

## 2014-12-16 LAB — CBC WITH DIFFERENTIAL/PLATELET
Basophils Absolute: 0.1 10*3/uL (ref 0.0–0.1)
Basophils Relative: 1 %
EOS PCT: 1 %
Eosinophils Absolute: 0.1 10*3/uL (ref 0.0–0.7)
HCT: 48 % (ref 39.0–52.0)
HEMOGLOBIN: 17.1 g/dL — AB (ref 13.0–17.0)
LYMPHS ABS: 1 10*3/uL (ref 0.7–4.0)
Lymphocytes Relative: 10 %
MCH: 32.8 pg (ref 26.0–34.0)
MCHC: 35.6 g/dL (ref 30.0–36.0)
MCV: 92 fL (ref 78.0–100.0)
Monocytes Absolute: 0.5 10*3/uL (ref 0.1–1.0)
Monocytes Relative: 5 %
NEUTROS PCT: 83 %
Neutro Abs: 7.7 10*3/uL (ref 1.7–7.7)
Platelets: 204 10*3/uL (ref 150–400)
RBC: 5.22 MIL/uL (ref 4.22–5.81)
RDW: 12.5 % (ref 11.5–15.5)
WBC: 9.3 10*3/uL (ref 4.0–10.5)

## 2014-12-16 LAB — BASIC METABOLIC PANEL
Anion gap: 22 — ABNORMAL HIGH (ref 5–15)
BUN: 19 mg/dL (ref 6–20)
CHLORIDE: 95 mmol/L — AB (ref 101–111)
CO2: 18 mmol/L — AB (ref 22–32)
Calcium: 9.3 mg/dL (ref 8.9–10.3)
Creatinine, Ser: 1.24 mg/dL (ref 0.61–1.24)
GFR calc Af Amer: >60 mL/min (ref >60)
GFR calc non Af Amer: >60 mL/min (ref >60)
Glucose, Bld: 419 mg/dL — ABNORMAL HIGH (ref 65–99)
POTASSIUM: 5 mmol/L (ref 3.5–5.1)
SODIUM: 135 mmol/L (ref 135–145)

## 2014-12-16 LAB — URINALYSIS, ROUTINE W REFLEX MICROSCOPIC
Bilirubin Urine: NEGATIVE
Ketones, ur: >80 mg/dL — AB
LEUKOCYTES UA: NEGATIVE
Nitrite: NEGATIVE
PH: 5.5 (ref 5.0–8.0)
Protein, ur: NEGATIVE mg/dL
Specific Gravity, Urine: >1.03 — ABNORMAL HIGH (ref 1.005–1.030)
Urobilinogen, UA: 0.2 mg/dL (ref 0.0–1.0)

## 2014-12-16 LAB — CBG MONITORING, ED
Glucose-Capillary: 424 mg/dL — ABNORMAL HIGH (ref 65–99)
Glucose-Capillary: 359 mg/dL — ABNORMAL HIGH (ref 65–99)

## 2014-12-16 LAB — URINE MICROSCOPIC-ADD ON

## 2014-12-16 MED ORDER — METHIMAZOLE 5 MG PO TABS
5.0000 mg | ORAL_TABLET | Freq: Two times a day (BID) | ORAL | Status: DC
  Administered 2014-12-17 (×2): 5 mg via ORAL
  Filled 2014-12-16 (×4): qty 1

## 2014-12-16 MED ORDER — SODIUM CHLORIDE 0.9 % IV BOLUS (SEPSIS)
2000.0000 mL | Freq: Once | INTRAVENOUS | Status: AC
  Administered 2014-12-16: 2000 mL via INTRAVENOUS

## 2014-12-16 MED ORDER — INSULIN ASPART PROT & ASPART (70-30 MIX) 100 UNIT/ML ~~LOC~~ SUSP
10.0000 [IU] | Freq: Every day | SUBCUTANEOUS | Status: DC
  Administered 2014-12-17: 10 [IU] via SUBCUTANEOUS
  Filled 2014-12-16: qty 10

## 2014-12-16 MED ORDER — INSULIN ASPART PROT & ASPART (70-30 MIX) 100 UNIT/ML ~~LOC~~ SUSP: 10.0000 [IU] | Freq: Two times a day (BID) | SUBCUTANEOUS | Status: DC

## 2014-12-16 MED ORDER — INSULIN ASPART PROT & ASPART (70-30 MIX) 100 UNIT/ML ~~LOC~~ SUSP
15.0000 [IU] | Freq: Every day | SUBCUTANEOUS | Status: DC
  Administered 2014-12-17: 15 [IU] via SUBCUTANEOUS
  Filled 2014-12-16: qty 10

## 2014-12-16 MED ORDER — SODIUM CHLORIDE 0.9 % IV SOLN
INTRAVENOUS | Status: DC
  Filled 2014-12-16: qty 2.5

## 2014-12-16 MED ORDER — ONDANSETRON HCL 4 MG/2ML IJ SOLN: 4.0000 mg | Freq: Four times a day (QID) | INTRAMUSCULAR | Status: DC | PRN

## 2014-12-16 MED ORDER — SODIUM CHLORIDE 0.9 % IV SOLN: INTRAVENOUS | Status: DC

## 2014-12-16 MED ORDER — ENOXAPARIN SODIUM 40 MG/0.4ML ~~LOC~~ SOLN
40.0000 mg | SUBCUTANEOUS | Status: DC
  Administered 2014-12-17: 40 mg via SUBCUTANEOUS
  Filled 2014-12-16: qty 0.4

## 2014-12-16 MED ORDER — MORPHINE SULFATE (PF) 2 MG/ML IV SOLN
1.0000 mg | INTRAVENOUS | Status: DC | PRN
  Administered 2014-12-17 (×2): 1 mg via INTRAVENOUS
  Filled 2014-12-16 (×2): qty 1

## 2014-12-16 MED ORDER — INFLUENZA VAC SPLIT QUAD 0.5 ML IM SUSY
0.5000 mL | PREFILLED_SYRINGE | INTRAMUSCULAR | Status: DC
  Filled 2014-12-16: qty 0.5

## 2014-12-16 MED ORDER — SODIUM CHLORIDE 0.9 % IV SOLN
INTRAVENOUS | Status: DC
  Administered 2014-12-17: 1000 mL via INTRAVENOUS
  Administered 2014-12-17: via INTRAVENOUS

## 2014-12-16 MED ORDER — SODIUM CHLORIDE 0.9 % IV SOLN
INTRAVENOUS | Status: DC
  Administered 2014-12-17: 3 [IU]/h via INTRAVENOUS
  Filled 2014-12-16: qty 2.5

## 2014-12-16 MED ORDER — DEXTROSE-NACL 5-0.45 % IV SOLN: INTRAVENOUS | Status: DC

## 2014-12-16 MED ORDER — DEXTROSE-NACL 5-0.45 % IV SOLN
INTRAVENOUS | Status: DC
  Administered 2014-12-17: 02:00:00 via INTRAVENOUS

## 2014-12-16 NOTE — H&P (Signed)
PCP:   No PCP Per Patient   Chief Complaint:  Nausea and vomiting  HPI: 49 year old male who   has a past medical history of Diabetes mellitus; Arthritis; Thyroid disease; DKA (diabetic ketoacidosis) (07/2014); Noncompliance with medication regimen; and Malnutrition. Today came to the hospital with nausea and vomiting, patient has history of diabetes mellitus and says he ran out of insulin 2 days ago. In the ED patient was found to have hyperglycemia with mild DKA. At this time patient complains of abdominal pain, burning in the chest from recurrent vomiting. Patient says that he was vomiting consistent for past 2 days. Denies shortness of breath, no fever no dysuria urgency frequency of urination. Admits to having blurred vision and headache.  Allergies:  No Known Allergies    Past Medical History  Diagnosis Date  . Diabetes mellitus   . Arthritis   . Thyroid disease   . DKA (diabetic ketoacidosis) 07/2014  . Noncompliance with medication regimen   . Malnutrition     History reviewed. No pertinent past surgical history.  Prior to Admission medications   Medication Sig Start Date End Date Taking? Authorizing Provider  gabapentin (NEURONTIN) 300 MG capsule Take 2 capsules (600 mg total) by mouth 3 (three) times daily as needed (leg pain). 09/22/14  Yes Kathie Dike, MD  insulin aspart protamine- aspart (NOVOLOG MIX 70/30) (70-30) 100 UNIT/ML injection Take 15units [place] qam with breakfast and 10units [place] qpm with dinner Patient taking differently: Inject 10-15 Units into the skin 2 (two) times daily with a meal. Take 15 units in the morning with breakfast and 10 units in the evening with dinner 09/22/14  Yes Kathie Dike, MD  methimazole (TAPAZOLE) 5 MG tablet Take 1 tablet (5 mg total) by mouth 2 (two) times daily. 09/22/14  Yes Kathie Dike, MD  blood glucose meter kit and supplies KIT Dispense based on patient and insurance preference. Use up to four times daily as directed. (FOR  ICD-9 250.00, 250.01). 09/22/14   Kathie Dike, MD  Insulin Syringe-Needle U-100 30G X 1/2" 0.5 ML MISC Use to inject insulin subcutaneously BID 09/22/14   Kathie Dike, MD    Social History:  reports that he has been smoking.  He does not have any smokeless tobacco history on file. He reports that he does not drink alcohol or use illicit drugs.   Family history Patient's mother has history of liver cancer Father died of MI   All the positives are listed in BOLD  Review of Systems:  HEENT: Headache, blurred vision, runny nose, sore throat Neck: Hypothyroidism, hyperthyroidism,,lymphadenopathy Chest : Shortness of breath, history of COPD, Asthma Heart : Chest pain, history of coronary arterey disease GI:  Nausea, vomiting, diarrhea, constipation, GERD GU: Dysuria, urgency, frequency of urination, hematuria Neuro: Stroke, seizures, syncope Psych: Depression, anxiety, hallucinations   Physical Exam: Blood pressure 120/94, pulse 74, temperature 98.5 F (36.9 C), temperature source Temporal, resp. rate 18, SpO2 100 %. Constitutional:   Patient is a malnourished appearing male* in no acute distress and cooperative with exam. Head: Normocephalic and atraumatic Mouth: Mucus membranes moist Eyes: PERRL, EOMI, conjunctivae normal Neck: Supple, No Thyromegaly Cardiovascular: RRR, S1 normal, S2 normal Pulmonary/Chest: CTAB, no wheezes, rales, or rhonchi Abdominal: Soft. Mild epigastric tenderness to palpation,non-distended, bowel sounds are normal, no masses, organomegaly, or guarding present.  Neurological: A&O x3, Strength is normal and symmetric bilaterally, cranial nerve II-XII are grossly intact, no focal motor deficit, sensory intact to light touch bilaterally.  Extremities : No  Cyanosis, Clubbing or Edema  Labs on Admission:  Basic Metabolic Panel:  Recent Labs Lab 12/16/14 2125  NA 135  K 5.0  CL 95*  CO2 18*  GLUCOSE 419*  BUN 19  CREATININE 1.24  CALCIUM 9.3   CBC:  Recent Labs Lab 12/16/14 2125  WBC 9.3  NEUTROABS 7.7  HGB 17.1*  HCT 48.0  MCV 92.0  PLT 204   Cardiac Enzymes: No results for input(s): CKTOTAL, CKMB, CKMBINDEX, TROPONINI in the last 168 hours.  BNP (last 3 results) No results for input(s): BNP in the last 8760 hours.  ProBNP (last 3 results) No results for input(s): PROBNP in the last 8760 hours.  CBG:  Recent Labs Lab 12/16/14 2039  GLUCAP 424*    Radiological Exams on Admission: No results found.    Assessment/Plan Active Problems:   DKA (diabetic ketoacidoses)   Hyperthyroidism   DM (diabetes mellitus)   DKA Patient has bicarbonate of 18, AG 22. We'll start the patient on DKA protocol Admit to stepdown Check BMP every 4 hours  Diabetes mellitus Continue patient's home dose of insulin 70/30 from tomorrow morning once patient is off the insulin drip Patient will need social work consult to arrange for insulin as outpatient  Hyperthyroidism Continue methimazole  Code status: Full code  Family discussion:    Time Spent on Admission: 60 min  [facility] Pager: [phone] 12/16/2014, 11:03 PM  If 7PM-7AM, please contact night-coverage  [email or web address]  Password [email or web address]

## 2014-12-16 NOTE — ED Notes (Signed)
Onset yesterday, pt feels like he is in DKA again, legs cramps, vomiting, headache, weak. Pt has no way to check BS

## 2014-12-16 NOTE — ED Provider Notes (Signed)
CSN: [id number]     Arrival date & time 12/16/14  2027 History  By signing my name below, I, Kenneth [name], attest that this documentation has been prepared under the direction and in the presence of Kenneth Bo, MD. Electronically Signed: Julien [name], ED Scribe. 12/16/2014. 9:44 PM.     Chief Complaint  Patient presents with  . Hyperglycemia      The history is provided by the patient. No language interpreter was used.   HPI Comments: [name] is a 49 y.o. male who has a hx of DM and DKA presents to the Emergency Department complaining of sudden onset, gradual worsening high blood sugar onset 2 days ago. Pt has been having vomiting with fluids and foods that he intakes, urinary frequency, headache and generalized weakness. He states he has not taken his insulin in two days. Pt report it feels similar to a DKA episode that he has had in the past. He denies diarrhea and fever.  Past Medical History  Diagnosis Date  . Diabetes mellitus   . Arthritis   . Thyroid disease   . DKA (diabetic ketoacidosis) 07/2014  . Noncompliance with medication regimen   . Malnutrition    History reviewed. No pertinent past surgical history. No family history on file. Social History  Substance Use Topics  . Smoking status: Current Every Day Smoker -- 0.50 packs/day for 8 years  . Smokeless tobacco: None  . Alcohol Use: No    Review of Systems  Constitutional: Negative for fever.  Gastrointestinal: Positive for vomiting. Negative for rectal pain.  Genitourinary: Positive for frequency.  Neurological: Positive for weakness and headaches.      Allergies  Review of patient's allergies indicates no known allergies.  Home Medications   Prior to Admission medications   Medication Sig Start Date End Date Taking? Authorizing Provider  gabapentin (NEURONTIN) 300 MG capsule Take 2 capsules (600 mg total) by mouth 3 (three) times daily as needed (leg pain). 09/22/14  Yes Kathie Dike, MD   insulin aspart protamine- aspart (NOVOLOG MIX 70/30) (70-30) 100 UNIT/ML injection Take 15units [place] qam with breakfast and 10units [place] qpm with dinner Patient taking differently: Inject 10-15 Units into the skin 2 (two) times daily with a meal. Take 15 units in the morning with breakfast and 10 units in the evening with dinner 09/22/14  Yes Kathie Dike, MD  methimazole (TAPAZOLE) 5 MG tablet Take 1 tablet (5 mg total) by mouth 2 (two) times daily. 09/22/14  Yes Kathie Dike, MD  blood glucose meter kit and supplies KIT Dispense based on patient and insurance preference. Use up to four times daily as directed. (FOR ICD-9 250.00, 250.01). 09/22/14   Kathie Dike, MD  Insulin Syringe-Needle U-100 30G X 1/2" 0.5 ML MISC Use to inject insulin subcutaneously BID 09/22/14   Kathie Dike, MD   Triage vitals: BP 120/94 mmHg  Pulse 74  Temp(Src) 98.5 F (36.9 C) (Temporal)  Resp 18  SpO2 100% Physical Exam  Constitutional: He is oriented to person, place, and time. He appears well-developed and well-nourished.  HENT:  Head: Normocephalic and atraumatic.  Right Ear: External ear normal.  Left Ear: External ear normal.  Mucus membranes are dry  Eyes: Conjunctivae and EOM are normal. Pupils are equal, round, and reactive to light.  Neck: Normal range of motion and phonation normal. Neck supple.  Cardiovascular: Normal rate, regular rhythm and normal heart sounds.   Pulmonary/Chest: Effort normal and breath sounds normal. He exhibits no bony  tenderness.  Abdominal: Soft. There is tenderness.  Mild diffuse tenderness  Musculoskeletal: Normal range of motion.  Neurological: He is alert and oriented to person, place, and time. No cranial nerve deficit or sensory deficit. He exhibits normal muscle tone. Coordination normal.  Skin: Skin is warm, dry and intact.  Psychiatric: He has a normal mood and affect. His behavior is normal. Judgment and thought content normal.  Nursing note and vitals  reviewed.   ED Course  Procedures   Medications  dextrose 5 %-0.45 % sodium chloride infusion (not administered)  insulin regular (NOVOLIN R,HUMULIN R) 250 Units in sodium chloride 0.9 % 250 mL (1 Units/mL) infusion (not administered)  sodium chloride 0.9 % bolus 2,000 mL (2,000 mLs Intravenous New Bag/Given 12/16/14 2136)    Patient Vitals for the past 24 hrs:  BP Temp Temp src Pulse Resp SpO2  12/16/14 2035 120/94 mmHg 98.5 F (36.9 C) Temporal 74 18 100 %    10:31 PM Reevaluation with update and discussion. After initial assessment and treatment, an updated evaluation reveals he is tolerating oral fluids. Findings chest with patient, all questions answered. KennethELLIOTT [name]   CRITICAL CARE Performed by: Kenneth [name] [name] Total critical care time: 35 minutes Critical care time was exclusive of separately billable procedures and treating other patients. Critical care was necessary to treat or prevent imminent or life-threatening deterioration. Critical care was time spent personally by me on the following activities: development of treatment plan with patient and/or surrogate as well as nursing, discussions with consultants, evaluation of patient's response to treatment, examination of patient, obtaining history from patient or surrogate, ordering and performing treatments and interventions, ordering and review of laboratory studies, ordering and review of radiographic studies, pulse oximetry and re-evaluation of patient's condition.  10:31 PM-Consult complete with Hospitalist. Patient case explained and discussed. He agrees to admit patient for further evaluation and treatment. Call ended at   DIAGNOSTIC STUDIES: Oxygen Saturation is 100% on RA, normal by my interpretation.  COORDINATION OF CARE:  9:46 PM Discussed treatment plan with pt at bedside and pt agreed to plan.  Labs Review Labs Reviewed  BASIC METABOLIC PANEL - Abnormal; Notable for the following:    Chloride 95 (*)     CO2 18 (*)    Glucose, Bld 419 (*)    Anion gap 22 (*)    All other components within normal limits  CBC WITH DIFFERENTIAL/PLATELET - Abnormal; Notable for the following:    Hemoglobin 17.1 (*)    All other components within normal limits  URINALYSIS, ROUTINE W REFLEX MICROSCOPIC (NOT AT [facility]) - Abnormal; Notable for the following:    Specific Gravity, Urine >1.030 (*)    Glucose, UA >1000 (*)    Hgb urine dipstick TRACE (*)    Ketones, ur >80 (*)    All other components within normal limits  URINE MICROSCOPIC-ADD ON - Abnormal; Notable for the following:    Squamous Epithelial / LPF FEW (*)    All other components within normal limits  CBG MONITORING, ED - Abnormal; Notable for the following:    Glucose-Capillary 424 (*)    All other components within normal limits    Imaging Review No results found. I have personally reviewed and evaluated these images and lab results as part of my medical decision-making.   EKG Interpretation None      MDM   Final diagnoses:  Hyperglycemia  Diabetic ketoacidosis without coma associated with type 1 diabetes mellitus  Noncompliance with medication regimen  Medication noncompliance with secondary hyperglycemia and subsequent DKA. Likely mild to moderate volume depletion. No evidence of frank infection. Patient will require operative stabilization, as well as social services assistance to obtain his medications and prevent further episodes of DKA, related to noncompliance.  Nursing Notes Reviewed/ Care Coordinated, and agree without changes. Applicable Imaging Reviewed.  Interpretation of Laboratory Data incorporated into ED treatment  I personally performed the services described in this documentation, which was scribed in my presence. The recorded information has been reviewed and is accurate.    Kenneth Bo, MD 12/16/14 2242

## 2014-12-17 DIAGNOSIS — E118 Type 2 diabetes mellitus with unspecified complications: Secondary | ICD-10-CM

## 2014-12-17 DIAGNOSIS — E0590 Thyrotoxicosis, unspecified without thyrotoxic crisis or storm: Secondary | ICD-10-CM | POA: Diagnosis not present

## 2014-12-17 DIAGNOSIS — E1310 Other specified diabetes mellitus with ketoacidosis without coma: Secondary | ICD-10-CM | POA: Diagnosis not present

## 2014-12-17 LAB — BASIC METABOLIC PANEL
ANION GAP: 10 (ref 5–15)
ANION GAP: 9 (ref 5–15)
Anion gap: 17 — ABNORMAL HIGH (ref 5–15)
BUN: 16 mg/dL (ref 6–20)
BUN: 16 mg/dL (ref 6–20)
BUN: 17 mg/dL (ref 6–20)
CALCIUM: 8.2 mg/dL — AB (ref 8.9–10.3)
CALCIUM: 8.4 mg/dL — AB (ref 8.9–10.3)
CHLORIDE: 106 mmol/L (ref 101–111)
CO2: 14 mmol/L — AB (ref 22–32)
CO2: 17 mmol/L — ABNORMAL LOW (ref 22–32)
CO2: 20 mmol/L — ABNORMAL LOW (ref 22–32)
CREATININE: 1.06 mg/dL (ref 0.61–1.24)
Calcium: 8.3 mg/dL — ABNORMAL LOW (ref 8.9–10.3)
Chloride: 108 mmol/L (ref 101–111)
Chloride: 107 mmol/L (ref 101–111)
Creatinine, Ser: 0.85 mg/dL (ref 0.61–1.24)
Creatinine, Ser: 0.72 mg/dL (ref 0.61–1.24)
GFR calc Af Amer: >60 mL/min (ref >60)
GFR calc Af Amer: >60 mL/min (ref >60)
GFR calc Af Amer: >60 mL/min (ref >60)
GFR calc non Af Amer: >60 mL/min (ref >60)
GLUCOSE: 180 mg/dL — AB (ref 65–99)
GLUCOSE: 278 mg/dL — AB (ref 65–99)
GLUCOSE: 99 mg/dL (ref 65–99)
Potassium: 4.3 mmol/L (ref 3.5–5.1)
Potassium: 4 mmol/L (ref 3.5–5.1)
Potassium: 3.8 mmol/L (ref 3.5–5.1)
Sodium: 137 mmol/L (ref 135–145)
Sodium: 135 mmol/L (ref 135–145)
Sodium: 136 mmol/L (ref 135–145)

## 2014-12-17 LAB — GLUCOSE, CAPILLARY
GLUCOSE-CAPILLARY: 287 mg/dL — AB (ref 65–99)
GLUCOSE-CAPILLARY: 241 mg/dL — AB (ref 65–99)
Glucose-Capillary: 112 mg/dL — ABNORMAL HIGH (ref 65–99)
Glucose-Capillary: 129 mg/dL — ABNORMAL HIGH (ref 65–99)
Glucose-Capillary: 142 mg/dL — ABNORMAL HIGH (ref 65–99)
Glucose-Capillary: 147 mg/dL — ABNORMAL HIGH (ref 65–99)
Glucose-Capillary: 183 mg/dL — ABNORMAL HIGH (ref 65–99)
Glucose-Capillary: 193 mg/dL — ABNORMAL HIGH (ref 65–99)
Glucose-Capillary: 254 mg/dL — ABNORMAL HIGH (ref 65–99)

## 2014-12-17 LAB — MRSA PCR SCREENING: MRSA by PCR: NEGATIVE

## 2014-12-17 MED ORDER — INSULIN ASPART 100 UNIT/ML ~~LOC~~ SOLN: 0.0000 [IU] | Freq: Every day | SUBCUTANEOUS | Status: DC

## 2014-12-17 MED ORDER — INSULIN ASPART PROT & ASPART (70-30 MIX) 100 UNIT/ML ~~LOC~~ SUSP
SUBCUTANEOUS | Status: AC
  Filled 2014-12-17: qty 10

## 2014-12-17 MED ORDER — INSULIN ASPART 100 UNIT/ML ~~LOC~~ SOLN
0.0000 [IU] | Freq: Three times a day (TID) | SUBCUTANEOUS | Status: DC
  Administered 2014-12-17: 3 [IU] via SUBCUTANEOUS
  Administered 2014-12-17: 2 [IU] via SUBCUTANEOUS

## 2014-12-17 MED ORDER — ALUM & MAG HYDROXIDE-SIMETH 200-200-20 MG/5ML PO SUSP
30.0000 mL | ORAL | Status: DC | PRN
  Administered 2014-12-17: 30 mL via ORAL
  Filled 2014-12-17: qty 30

## 2014-12-17 NOTE — Clinical Social Work Note (Signed)
Clinical Social Work Assessment  Patient Details  Name: Kenneth [name] MRN: [id number] Date of Birth: [birth year]  Date of referral:  12/17/14               Reason for consult:  Housing Concerns/Homelessness                Permission sought to share information with:    Permission granted to share information::     Name::        Agency::     Relationship::     Contact Information:     Housing/Transportation Living arrangements for the past 2 months:  No permanent address Source of Information:  Patient Patient Interpreter Needed:  None Criminal Activity/Legal Involvement Pertinent to Current Situation/Hospitalization:  No - Comment as needed Significant Relationships:  Friend, Adult Children, Other Family Members Lives with:   (Homeless) Do you feel safe going back to the place where you live?  Yes Need for family participation in patient care:  No (Coment)  Care giving concerns:  None identified.    Social Worker assessment / plan:  Patient advised that he was homeless and typically stays with family and friends.  He indicated that he was trying to get disability but was denied.  He stated that he is unemployed.  Patient advised that he is debating on whether to go back to work or keep fighting for disability. He stated that he walks unassisted and requires no assistance with ADLs.  Patient advised that he is on the list at the [employer] and indicates that he will have housing soon through them.  CSW provided patient with a list of housing resources. Patient stated that he will continue to work with the housing authority for his housing needs as they have assured him that he will be housed soon.    Employment status:  Unemployed Health and safety inspector:  Managed Care PT Recommendations:    Information / Referral to community resources:  Shelter (Housing resources)  Patient/Family's Response to care:  Patient realizes that needs to continue working with [place] so  that they can address his basic need for housing.   Patient/Family's Understanding of and Emotional Response to Diagnosis, Current Treatment, and Prognosis:  Patient verbalizes the need to continue taking his medications as prescribed based on his current diagnosis, treatment and prognosis.   Emotional Assessment Appearance:  Developmentally appropriate Attitude/Demeanor/Rapport:   (Cooperative) Affect (typically observed):  Calm Orientation:  Oriented to Self, Oriented to Place, Oriented to  Time, Oriented to Situation Alcohol / Substance use:  Not Applicable Psych involvement (Current and /or in the community):  No (Comment)  Discharge Needs  Concerns to be addressed:  Homelessness Readmission within the last 30 days:  No Current discharge risk:  Homeless Barriers to Discharge:  Homeless with medical needs   Annice Needy, LCSW 12/17/2014, 11:54 AM [phone]

## 2014-12-17 NOTE — Progress Notes (Signed)
[facility] PROGRESS NOTE  [name]:[id number] DOB: 04/22/1965 DOA: 12/16/2014 PCP: No PCP Per Patient  Assessment/Plan: 1. DKA. Resolved. Presented with elevated blood sugar and AG 22. Started on DKA protocol with IVF and IV insulin infusion. AG has normalized and blood sugars are stable. He has been transition to subcutaneous insulin. Bicarb is mildly low, so we will continue on aggressive hydration for today. If blood sugars remain stable, he can potentially discharge home later today. Will request case amangement to see if he can receive any assistance in getting his medications.  2. DM Type 1. Continue SSI. Discussed importance of proper eating to control blood sugar.   3. Hyperthyroidism. Continue methimazole and check TSH.   Code Status: Full DVT prophylaxis: Lovenox Family Communication: Discussed with patient who understands and has no concerns at this time. Disposition Plan: Discharge home within 24 hours.     Consultants:    Procedures:    Antibiotics:    HPI/Subjective: Is feeling better. States he ran out of insulin but upon discharge will be able to get a refill through the health dept. He does admit his diet may be a factor but does not have the resources to eat better. AT baseline his BS is around 400's  Objective: Filed Vitals:   12/17/14 0545  BP: 84/56  Pulse: 58  Temp:   Resp: 16    Intake/Output Summary (Last 24 hours) at 12/17/14 0624 Last data filed at 12/16/14 2353  Gross per 24 hour  Intake      0 ml  Output    300 ml  Net   -300 ml   Filed Weights   12/16/14 2337 12/17/14 0500  Weight: 52.8 kg (116 lb 6.5 oz) 55.2 kg (121 lb 11.1 oz)    Exam:  General: NAD, looks comfortable. Sitting up in bed eating breakfast Cardiovascular: RRR, S1, S2  Respiratory: clear bilaterally, No wheezing, rales or rhnochi Abdomen: soft, non tender, no distention , bowel sounds normal Musculoskeletal: No edema b/l  Data Reviewed: Basic  Metabolic Panel:  Recent Labs Lab 12/16/14 2125 12/17/14 0045 12/17/14 0335  NA 135 137 135  K 5.0 4.3 4.0  CL 95* 106 108  CO2 18* 14* 17*  GLUCOSE 419* 278* 180*  BUN CREATININE 1.24 1.06 0.85  CALCIUM 9.3 8.3* 8.2*     Recent Labs Lab 12/16/14 2125  WBC 9.3  NEUTROABS 7.7  HGB 17.1*  HCT 48.0  MCV 92.0  PLT 204   CBG:  Recent Labs Lab 12/17/14 0147 12/17/14 0304 12/17/14 0406 12/17/14 0454 12/17/14 0554  GLUCAP 183* 193* 142* 129* 287*    Recent Results (from the past 240 hour(s))  MRSA PCR Screening     Status: None   Collection Time: 12/16/14 11:25 PM  Result Value Ref Range Status   MRSA by PCR NEGATIVE NEGATIVE Final    Comment:        The GeneXpert MRSA Assay (FDA approved for NASAL specimens only), is one component of a comprehensive MRSA colonization surveillance program. It is not intended to diagnose MRSA infection nor to guide or monitor treatment for MRSA infections.      Studies: No results found.  Scheduled Meds: . enoxaparin (LOVENOX) injection  40 mg Subcutaneous Q24H  . Influenza vac split quadrivalent PF  0.5 mL Intramuscular Tomorrow-1000  . insulin aspart  0-5 Units Subcutaneous QHS  . insulin aspart  0-9 Units Subcutaneous TID WC  . insulin aspart protamine-  aspart  10 Units Subcutaneous Q supper  . insulin aspart protamine- aspart  15 Units Subcutaneous Q breakfast  . methimazole  5 mg Oral BID   Continuous Infusions: . sodium chloride Stopped (12/17/14 0149)  . insulin (NOVOLIN-R) infusion 3 Units/hr (12/17/14 0002)    Active Problems:   DKA (diabetic ketoacidoses)   Hyperthyroidism   DM (diabetes mellitus)    Time spent: 25 minutes      Erick Blinks, MD  [facility] Pager [phone]. If 7PM-7AM, please contact night-coverage at [email or web address], password [facility] 12/17/2014, 6:24 AM       By signing my name below, I, Kenneth [name], attest that this documentation has been prepared under  the direction and in the presence of Erick Blinks, MD. Electronically signed: Zadie [name], Scribe. 12/17/2014 8:38 AM    I, Dr. Erick Blinks, personally performed the services described in this documentaiton. All medical record entries made by the scribe were at my direction and in my presence. I have reviewed the chart and agree that the record reflects my personal performance and is accurate and complete  Erick Blinks, MD, 12/17/2014 9:26 AM

## 2014-12-17 NOTE — Progress Notes (Signed)
Patient given discharge instruction by L.Hylton, RN. Patient left facility in stable condition with 70/30 insulin. Patient taken out of facility by NT via wheelchair.

## 2014-12-17 NOTE — Care Management Note (Signed)
Case Management Note  Patient Details  Name: Kenneth [name] MRN: [id number] Date of Birth: [birth year]  Expected Discharge Date:     12/17/2014             Expected Discharge Plan:  Home/Self Care  In-House Referral:  Clinical Social Work  Discharge planning Services  CM Consult  Post Acute Care Choice:  NA Choice offered to:  NA  DME Arranged:    DME Agency:     HH Arranged:    HH Agency:     Status of Service:  Completed, signed off  Medicare Important Message Given:    Date Medicare IM Given:    Medicare IM give by:    Date Additional Medicare IM Given:    Additional Medicare Important Message give by:     If discussed at Long Length of Stay Meetings, dates discussed:    Additional Comments: Pt admitted with DKA. Pt is homeless, uninsured and unemployed. Pt goes to the [facility] health dept for PCP care and med needs. Pt says he stayed with a friend the night before coming to the hospital. Pt says she has no transportation. Pt first said he had no transportation, no supply of medications at home and he admitted that he did use the [facility] voucher given to him in July. FC has been consulted and will contact pt prior to DC. CSW was referred to discuss homelessness. Pt later questioned further, he says he thinks he has medications waiting for him at the [facility] health dept. But doesn't know if he can afford them but he doesn't know what he will be asked to pay. Pt also says he has no way to get to the [facility] health dept. Pt given info on the [facility] bus which will transport pt for $1, info on [place] med assist and info on other resources in the [facility] area. Pt will also be given what is left of his vial of 70/30 at DC to further assist with med needs and ensure he has a couple doses to last until he can get to HD. No further CM assistance identified. DC anticipated in next 24 hours.    Malcolm Metro, RN 12/17/2014, 1:44 PM

## 2014-12-17 NOTE — Discharge Summary (Signed)
Physician Discharge Summary  Kenneth [name] EXU:[id number] DOB: [birth year] DOA: 12/16/2014  PCP: No PCP Per Patient  Admit date: 12/16/2014 Discharge date: 12/17/2014  Time spent: 35 minutes  Recommendations for Outpatient Follow-up:  1. Follow up with primary care physician in 1-2 weeks  Discharge Diagnoses:  Active Problems:   DKA (diabetic ketoacidoses)   Hyperthyroidism   DM (diabetes mellitus)   Discharge Condition: improved  Diet recommendation: low carb  Filed Weights   12/16/14 2337 12/17/14 0500  Weight: 52.8 kg (116 lb 6.5 oz) 55.2 kg (121 lb 11.1 oz)    History of present illness and hospital course:  This patient presented to the hospital with nausea and vomiting. He ran out of insulin at home and was found to be in DKA. Patient was admitted to the step down unit and was started on aggressive hydration and insulin infusion. His anion gap closed and he was transitioned back to 70/30 insulin. Blood sugars have remained stable. He is tolerating a solid diet. He has been adequately hydrated and hemodynamics are stable. Patient received medication assistance and has been set up with health department. He is stable for discharge home.  Discharge Exam: Filed Vitals:   12/17/14 1700  BP: 100/59  Pulse: 69  Temp:   Resp: 20    Discharge Instructions   Discharge Instructions    Diet - low sodium heart healthy    Complete by:  As directed      Diet Carb Modified    Complete by:  As directed      Increase activity slowly    Complete by:  As directed           Discharge Medication List as of 12/17/2014  5:23 PM    CONTINUE these medications which have NOT CHANGED   Details  gabapentin (NEURONTIN) 300 MG capsule Take 2 capsules (600 mg total) by mouth 3 (three) times daily as needed (leg pain)., Starting 09/22/2014, Until Discontinued, Print    insulin aspart protamine- aspart (NOVOLOG MIX 70/30) (70-30) 100 UNIT/ML injection Take 15units [place] qam with breakfast and  10units [place] qpm with dinner, Print    methimazole (TAPAZOLE) 5 MG tablet Take 1 tablet (5 mg total) by mouth 2 (two) times daily., Starting 09/22/2014, Until Discontinued, Print    blood glucose meter kit and supplies KIT Dispense based on patient and insurance preference. Use up to four times daily as directed. (FOR ICD-9 250.00, 250.01)., Print    Insulin Syringe-Needle U-100 30G X 1/2" 0.5 ML MISC Use to inject insulin subcutaneously BID, Print       No Known Allergies Follow-up Information    Follow up with primary care doctor in 1-2 weeks. Schedule an appointment as soon as possible for a visit in 2 weeks.       The results of significant diagnostics from this hospitalization (including imaging, microbiology, ancillary and laboratory) are listed below for reference.    Significant Diagnostic Studies: No results found.  Microbiology: Recent Results (from the past 240 hour(s))  MRSA PCR Screening     Status: None   Collection Time: 12/16/14 11:25 PM  Result Value Ref Range Status   MRSA by PCR NEGATIVE NEGATIVE Final    Comment:        The GeneXpert MRSA Assay (FDA approved for NASAL specimens only), is one component of a comprehensive MRSA colonization surveillance program. It is not intended to diagnose MRSA infection nor to guide or monitor treatment for MRSA infections.  Labs: Basic Metabolic Panel:  Recent Labs Lab 12/16/14 2125 12/17/14 0045 12/17/14 0335 12/17/14 0800  NA 135 137 135 136  K 5.0 4.3 4.0 3.8  CL 95* 106 108 107  CO2 18* 14* 17* 20*  GLUCOSE 419* 278* 180* 99  BUN $Re'19 17 16 16  'aaR$ CREATININE 1.24 1.06 0.85 0.72  CALCIUM 9.3 8.3* 8.2* 8.4*   Liver Function Tests: No results for input(s): AST, ALT, ALKPHOS, BILITOT, PROT, ALBUMIN in the last 168 hours. No results for input(s): LIPASE, AMYLASE in the last 168 hours. No results for input(s): AMMONIA in the last 168 hours. CBC:  Recent Labs Lab 12/16/14 2125  WBC 9.3  NEUTROABS 7.7   HGB 17.1*  HCT 48.0  MCV 92.0  PLT 204   Cardiac Enzymes: No results for input(s): CKTOTAL, CKMB, CKMBINDEX, TROPONINI in the last 168 hours. BNP: BNP (last 3 results) No results for input(s): BNP in the last 8760 hours.  ProBNP (last 3 results) No results for input(s): PROBNP in the last 8760 hours.  CBG:  Recent Labs Lab 12/17/14 0454 12/17/14 0554 12/17/14 0738 12/17/14 1144 12/17/14 1616  GLUCAP 129* 287* 112* 147* 241*       Signed:  MEMON,JEHANZEB  [facility] 12/17/2014, 7:46 PM

## 2015-01-18 ENCOUNTER — Emergency Department (HOSPITAL_COMMUNITY)
Admission: EM | Admit: 2015-01-18 | Discharge: 2015-01-19 | Disposition: A | Discharge status: Home / Self Care | Payer: Self-pay | Attending: Emergency Medicine | Admitting: Emergency Medicine

## 2015-01-18 ENCOUNTER — Encounter (HOSPITAL_COMMUNITY): Payer: Self-pay | Admitting: *Deleted

## 2015-01-18 DIAGNOSIS — R079 Chest pain, unspecified: Secondary | ICD-10-CM | POA: Insufficient documentation

## 2015-01-18 DIAGNOSIS — E860 Dehydration: Secondary | ICD-10-CM | POA: Insufficient documentation

## 2015-01-18 DIAGNOSIS — E1165 Type 2 diabetes mellitus with hyperglycemia: Secondary | ICD-10-CM | POA: Insufficient documentation

## 2015-01-18 DIAGNOSIS — R05 Cough: Secondary | ICD-10-CM | POA: Insufficient documentation

## 2015-01-18 DIAGNOSIS — Z8739 Personal history of other diseases of the musculoskeletal system and connective tissue: Secondary | ICD-10-CM | POA: Insufficient documentation

## 2015-01-18 DIAGNOSIS — R739 Hyperglycemia, unspecified: Secondary | ICD-10-CM

## 2015-01-18 DIAGNOSIS — Z9114 Patient's other noncompliance with medication regimen: Secondary | ICD-10-CM | POA: Insufficient documentation

## 2015-01-18 DIAGNOSIS — Z794 Long term (current) use of insulin: Secondary | ICD-10-CM | POA: Insufficient documentation

## 2015-01-18 DIAGNOSIS — Z720 Tobacco use: Secondary | ICD-10-CM | POA: Insufficient documentation

## 2015-01-18 DIAGNOSIS — R0602 Shortness of breath: Secondary | ICD-10-CM | POA: Insufficient documentation

## 2015-01-18 DIAGNOSIS — E079 Disorder of thyroid, unspecified: Secondary | ICD-10-CM | POA: Insufficient documentation

## 2015-01-18 LAB — URINALYSIS, ROUTINE W REFLEX MICROSCOPIC
Bilirubin Urine: NEGATIVE
HGB URINE DIPSTICK: NEGATIVE
LEUKOCYTES UA: NEGATIVE
Nitrite: NEGATIVE
PH: 6.5 (ref 5.0–8.0)
PROTEIN: NEGATIVE mg/dL
Urobilinogen, UA: 0.2 mg/dL (ref 0.0–1.0)

## 2015-01-18 LAB — URINE MICROSCOPIC-ADD ON

## 2015-01-18 LAB — CBG MONITORING, ED

## 2015-01-18 MED ORDER — SODIUM CHLORIDE 0.9 % IV SOLN
INTRAVENOUS | Status: DC
  Administered 2015-01-19: 3.7 [IU]/h via INTRAVENOUS
  Filled 2015-01-18: qty 2.5

## 2015-01-18 MED ORDER — SODIUM CHLORIDE 0.9 % IV SOLN
1000.0000 mL | Freq: Once | INTRAVENOUS | Status: AC
  Administered 2015-01-18: 1000 mL via INTRAVENOUS

## 2015-01-18 MED ORDER — SODIUM CHLORIDE 0.9 % IV SOLN
1000.0000 mL | Freq: Once | INTRAVENOUS | Status: AC
Start: 2015-01-18 — End: 2015-01-19
  Administered 2015-01-18: 23:00:00 via INTRAVENOUS

## 2015-01-18 MED ORDER — SODIUM CHLORIDE 0.9 % IV SOLN
1000.0000 mL | INTRAVENOUS | Status: DC
  Administered 2015-01-19: 1000 mL via INTRAVENOUS

## 2015-01-18 NOTE — ED Provider Notes (Signed)
CSN: [id number]     Arrival date & time 01/18/15  2238 History  By signing my name below, I, Stephania Fragmin, attest that this documentation has been prepared under the direction and in the presence of Rolland Porter, MD at 2326. Electronically Signed: Stephania Fragmin, ED Scribe. 01/18/2015. 1:04 AM.   Chief Complaint  Patient presents with  . Hyperglycemia   The history is provided by the patient. No language interpreter was used.   HPI Comments: Kenneth [name] is a 49 y.o. male with a history of DM since 2009, thyroid disease, DKA, noncompliance with medication regimen, and malnutrition, who presents to the Emergency Department complaining of hyperglycemia that he reports is chronic but seems to have acutely worsened today. He states he "always feels bad" due to uncontrolled DM but has been "feeling more bad" today, acting more irritably around others starting about 4:30 pm tonight. He also complains of a productive cough with gray phlegm that began 1-2 days ago, chills, SOB, aching left upper chest pain, lightheadedness and dizziness with standing, polyuria, polydipsia, and "sores" on his bilateral legs. He denies nausea, vomiting, or diarrhea. He denies fever and chills but states he feels cold. He notes he lives alone in a disability apartment and sometimes sees shadows on the wall. He states he did have weight loss at one point in the recent past, but his weight is stable now. Patient states he going to [place] but does not currently have a PCP due to lack of job/insurance. He reports he stopped taking his thyroid medications 1 month ago because he ran out. He is currently on Neurontin. Patient is a ~0.5 PPD smoker. He denies a history of smokeless tobacco use. He denies nausea or vomiting.   PCP [facility]   Past Medical History  Diagnosis Date  . Diabetes mellitus   . Arthritis   . Thyroid disease   . DKA (diabetic ketoacidosis) ([place]) 07/2014  .  Noncompliance with medication regimen   . Malnutrition ([place])    History reviewed. No pertinent past surgical history. No family history on file. Social History  Substance Use Topics  . Smoking status: Current Every Day Smoker -- 0.50 packs/day for 8 years  . Smokeless tobacco: None  . Alcohol Use: No  lives in a disability apartment Lives alone  Review of Systems  Constitutional: Positive for chills.  Respiratory: Positive for cough and shortness of breath.   Cardiovascular: Positive for chest pain.  Endocrine: Positive for polydipsia and polyuria.  Skin: Positive for rash ("sores" on legs, per pt).  Neurological: Positive for dizziness and light-headedness.  All other systems reviewed and are negative.  Allergies  Review of patient's allergies indicates no known allergies.  Home Medications   Prior to Admission medications   Medication Sig Start Date End Date Taking? Authorizing Provider  insulin lispro (HUMALOG) 100 UNIT/ML injection Inject into the skin once as needed for high blood sugar.   Yes Historical Provider, MD  blood glucose meter kit and supplies KIT Dispense based on patient and insurance preference. Use up to four times daily as directed. (FOR ICD-9 250.00, 250.01). 09/22/14   Kathie Dike, MD  gabapentin (NEURONTIN) 300 MG capsule Take 2 capsules (600 mg total) by mouth 3 (three) times daily as needed (leg pain). Patient not taking: Reported on 01/18/2015 09/22/14   Kathie Dike, MD  insulin aspart protamine- aspart (NOVOLOG MIX 70/30) (70-30) 100 UNIT/ML injection Take 15units [place] qam with breakfast and 10units [place]  qpm with dinner Patient taking differently: Inject 10-15 Units into the skin 2 (two) times daily with a meal. Take 15 units in the morning with breakfast and 10 units in the evening with dinner 09/22/14   Kathie Dike, MD  Insulin Syringe-Needle U-100 30G X 1/2" 0.5 ML MISC Use to inject insulin subcutaneously BID 09/22/14   Kathie Dike, MD  methimazole  (TAPAZOLE) 5 MG tablet Take 1 tablet (5 mg total) by mouth 2 (two) times daily. Patient not taking: Reported on 01/18/2015 09/22/14   Kathie Dike, MD   BP 102/72 mmHg  Pulse 60  Temp(Src) 97.9 F (36.6 C)  Resp 20  Ht _0  (1.778 m)  Wt 145 lb (65.772 kg)  BMI 20.81 kg/m2  SpO2 99%  Vital signs normal   Physical Exam  Constitutional: He is oriented to person, place, and time.  Non-toxic appearance. He does not appear ill. No distress.  Thin male.   HENT:  Head: Normocephalic and atraumatic.  Right Ear: External ear normal.  Left Ear: External ear normal.  Nose: Nose normal. No mucosal edema or rhinorrhea.  Mouth/Throat: Oropharynx is clear and moist and mucous membranes are normal. No dental abscesses or uvula swelling.  Eyes: Conjunctivae and EOM are normal. Pupils are equal, round, and reactive to light.  Neck: Normal range of motion and full passive range of motion without pain. Neck supple.  Cardiovascular: Normal rate, regular rhythm and normal heart sounds.  Exam reveals no gallop and no friction rub.   No murmur heard. Pulmonary/Chest: Effort normal and breath sounds normal. No respiratory distress. He has no wheezes. He has no rhonchi. He has no rales. He exhibits no tenderness and no crepitus.  Abdominal: Soft. Normal appearance and bowel sounds are normal. He exhibits no distension. There is no tenderness. There is no rebound and no guarding.  Musculoskeletal: Normal range of motion. He exhibits no edema or tenderness.  Moves all extremities well.   Neurological: He is alert and oriented to person, place, and time. He has normal strength. No cranial nerve deficit.  Skin: Skin is warm, dry and intact. No rash noted. No erythema. No pallor.  Patient does not have any "sores" on his legs. He does have some scattered small hyperpigmented areas that currently look like freckling. This makes patient extremely angry.  Psychiatric: His speech is normal. His affect is labile.  He is agitated and aggressive.  Easily agitated and angry.  Nursing note and vitals reviewed.   ED Course  Procedures (including critical care time)  Medications  0.9 %  sodium chloride infusion (0 mLs Intravenous Stopped 01/19/15 0001)    Followed by  0.9 %  sodium chloride infusion (0 mLs Intravenous Stopped 01/19/15 0053)    Followed by  0.9 %  sodium chloride infusion (0 mLs Intravenous Stopped 01/19/15 0210)  insulin regular (NOVOLIN R,HUMULIN R) 250 Units in sodium chloride 0.9 % 250 mL (1 Units/mL) infusion (2.8 Units/hr Intravenous Rate/Dose Change 01/19/15 0345)  sodium chloride 0.9 % bolus 1,000 mL (0 mLs Intravenous Stopped 01/19/15 0330)     DIAGNOSTIC STUDIES: Oxygen Saturation is 99% on RA, normal by my interpretation.    COORDINATION OF CARE: 11:35 PM - Discussed treatment plan with pt at bedside. Pt verbalized understanding and agreed to plan.   Patient was started on the [place] for his hyperglycemia while waiting for his blood test result. His CBG was over 600.  On review of patient's laboratory results he was not in diabetic ketoacidosis.  He was given the IV fluids and his CBG slowly improved.  3:45 AM patient CBG has improved to 198. At this point he was felt to be stable for discharge.   Labs Review Results for orders placed or performed during the hospital encounter of 01/18/15  Urinalysis, Routine w reflex microscopic (not at [facility])  Result Value Ref Range   Color, Urine YELLOW YELLOW   APPearance CLEAR CLEAR   Specific Gravity, Urine <1.005 (L) 1.005 - 1.030   pH 6.5 5.0 - 8.0   Glucose, UA >1000 (A) NEGATIVE mg/dL   Hgb urine dipstick NEGATIVE NEGATIVE   Bilirubin Urine NEGATIVE NEGATIVE   Ketones, ur TRACE (A) NEGATIVE mg/dL   Protein, ur NEGATIVE NEGATIVE mg/dL   Urobilinogen, UA 0.2 0.0 - 1.0 mg/dL   Nitrite NEGATIVE NEGATIVE   Leukocytes, UA NEGATIVE NEGATIVE  CBC with Differential  Result Value Ref Range   WBC 8.7 4.0 - 10.5 K/uL    RBC 3.85 (L) 4.22 - 5.81 MIL/uL   Hemoglobin 12.4 (L) 13.0 - 17.0 g/dL   HCT 35.7 (L) 39.0 - 52.0 %   MCV 92.7 78.0 - 100.0 fL   MCH 32.2 26.0 - 34.0 pg   MCHC 34.7 30.0 - 36.0 g/dL   RDW 12.3 11.5 - 15.5 %   Platelets 151 150 - 400 K/uL   Neutrophils Relative % 63 %   Neutro Abs 5.5 1.7 - 7.7 K/uL   Lymphocytes Relative 25 %   Lymphs Abs 2.1 0.7 - 4.0 K/uL   Monocytes Relative 8 %   Monocytes Absolute 0.7 0.1 - 1.0 K/uL   Eosinophils Relative 3 %   Eosinophils Absolute 0.2 0.0 - 0.7 K/uL   Basophils Relative 1 %   Basophils Absolute 0.0 0.0 - 0.1 K/uL  Comprehensive metabolic panel  Result Value Ref Range   Sodium 136 135 - 145 mmol/L   Potassium 3.4 (L) 3.5 - 5.1 mmol/L   Chloride 102 101 - 111 mmol/L   CO2 27 22 - 32 mmol/L   Glucose, Bld 508 (H) 65 - 99 mg/dL   BUN 20 6 - 20 mg/dL   Creatinine, Ser 0.77 0.61 - 1.24 mg/dL   Calcium 8.4 (L) 8.9 - 10.3 mg/dL   Total Protein 5.8 (L) 6.5 - 8.1 g/dL   Albumin 3.4 (L) 3.5 - 5.0 g/dL   AST 16 15 - 41 U/L   ALT 13 (L) 17 - 63 U/L   Alkaline Phosphatase 70 38 - 126 U/L   Total Bilirubin 0.5 0.3 - 1.2 mg/dL   GFR calc non Af Amer >60 >60 mL/min   GFR calc Af Amer >60 >60 mL/min   Anion gap 7 5 - 15  Urine microscopic-add on  Result Value Ref Range   Squamous Epithelial / LPF RARE RARE   WBC, UA 0-2 <3 WBC/hpf   RBC / HPF 0-2 <3 RBC/hpf   Bacteria, UA RARE RARE  TSH  Result Value Ref Range   TSH 0.468 0.350 - 4.500 uIU/mL  Troponin I  Result Value Ref Range   Troponin I <0.03 <0.031 ng/mL  Lactic acid, plasma  Result Value Ref Range   Lactic Acid, Venous 1.6 0.5 - 2.0 mmol/L  Lactic acid, plasma  Result Value Ref Range   Lactic Acid, Venous 1.0 0.5 - 2.0 mmol/L  Troponin I  Result Value Ref Range   Troponin I <0.03 <0.031 ng/mL  CBG monitoring, ED  Result Value Ref Range   Glucose-Capillary >600 (HH)  65 - 99 mg/dL  CBG monitoring, ED  Result Value Ref Range   Glucose-Capillary 425 (H) 65 - 99 mg/dL  CBG  monitoring, ED  Result Value Ref Range   Glucose-Capillary 302 (H) 65 - 99 mg/dL  CBG monitoring, ED  Result Value Ref Range   Glucose-Capillary 379 (H) 65 - 99 mg/dL  CBG monitoring, ED  Result Value Ref Range   Glucose-Capillary 198 (H) 65 - 99 mg/dL    Laboratory interpretation all normal except hyperglycemia without lactic acid doses or metabolic acidosis, normal TSH screen is by not taking his thyroid medication   Imaging Review Dg Chest 2 View  01/19/2015  CLINICAL DATA:  Acute onset of cough and left-sided chest pain. Hyperglycemia. Initial encounter. EXAM: CHEST  2 VIEW COMPARISON:  Chest radiograph performed 09/21/2014 FINDINGS: The lungs are well-aerated and clear. There is no evidence of focal opacification, pleural effusion or pneumothorax. The heart is normal in size; the mediastinal contour is within normal limits. No acute osseous abnormalities are seen. IMPRESSION: No acute cardiopulmonary process seen. Electronically Signed   By: Garald Balding M.D.   On: 01/19/2015 02:38   I have personally reviewed and evaluated these images and lab results as part of my medical decision-making.   EKG Interpretation   Date/Time:  Saturday January 18 2015 23:44:16 EDT Ventricular Rate:  64 PR Interval:  203 QRS Duration: 81 QT Interval:  417 QTC Calculation: 430 R Axis:   101 Text Interpretation:  Sinus rhythm Borderline prolonged PR interval  Anterior infarct, old Baseline wander in lead(s) II III aVR aVF Since last  tracing 24 Jul 2014 T wave inversion no longer evident in Inferior leads  peaked T waves no longer present Confirmed by Keawe Marcello  MD-I, Nillie Bartolotta ([id number]) on  01/19/2015 12:01:41 AM      MDM   Final diagnoses:  Dehydration  Hyperglycemia   Plan discharge  Rolland Porter, MD, FACEP   I personally performed the services described in this documentation, which was scribed in my presence. The recorded information has been reviewed and considered.  Rolland Porter, MD,  Barbette Or, MD 01/19/15 [phone]

## 2015-01-18 NOTE — ED Notes (Signed)
Pt c/o hyperglycemia, machine would read high, per ems their machine would read high as well, pt states that he took "fast acting" insulin but not sure how much,

## 2015-01-19 ENCOUNTER — Emergency Department (HOSPITAL_COMMUNITY): Payer: 59

## 2015-01-19 LAB — TROPONIN I

## 2015-01-19 LAB — COMPREHENSIVE METABOLIC PANEL
ALK PHOS: 70 U/L (ref 38–126)
ALT: 13 U/L — AB (ref 17–63)
AST: 16 U/L (ref 15–41)
Albumin: 3.4 g/dL — ABNORMAL LOW (ref 3.5–5.0)
Anion gap: 7 (ref 5–15)
BUN: 20 mg/dL (ref 6–20)
CHLORIDE: 102 mmol/L (ref 101–111)
CO2: 27 mmol/L (ref 22–32)
Calcium: 8.4 mg/dL — ABNORMAL LOW (ref 8.9–10.3)
Creatinine, Ser: 0.77 mg/dL (ref 0.61–1.24)
GFR calc Af Amer: >60 mL/min (ref >60)
Glucose, Bld: 508 mg/dL — ABNORMAL HIGH (ref 65–99)
Potassium: 3.4 mmol/L — ABNORMAL LOW (ref 3.5–5.1)
SODIUM: 136 mmol/L (ref 135–145)
Total Bilirubin: 0.5 mg/dL (ref 0.3–1.2)
Total Protein: 5.8 g/dL — ABNORMAL LOW (ref 6.5–8.1)

## 2015-01-19 LAB — CBC WITH DIFFERENTIAL/PLATELET
BASOS ABS: 0 10*3/uL (ref 0.0–0.1)
Basophils Relative: 1 %
EOS PCT: 3 %
Eosinophils Absolute: 0.2 10*3/uL (ref 0.0–0.7)
HCT: 35.7 % — ABNORMAL LOW (ref 39.0–52.0)
HEMOGLOBIN: 12.4 g/dL — AB (ref 13.0–17.0)
LYMPHS ABS: 2.1 10*3/uL (ref 0.7–4.0)
LYMPHS PCT: 25 %
MCH: 32.2 pg (ref 26.0–34.0)
MCHC: 34.7 g/dL (ref 30.0–36.0)
MCV: 92.7 fL (ref 78.0–100.0)
Monocytes Absolute: 0.7 10*3/uL (ref 0.1–1.0)
Monocytes Relative: 8 %
NEUTROS PCT: 63 %
Neutro Abs: 5.5 10*3/uL (ref 1.7–7.7)
PLATELETS: 151 10*3/uL (ref 150–400)
RBC: 3.85 MIL/uL — AB (ref 4.22–5.81)
RDW: 12.3 % (ref 11.5–15.5)
WBC: 8.7 10*3/uL (ref 4.0–10.5)

## 2015-01-19 LAB — TSH: TSH: 0.468 u[IU]/mL (ref 0.350–4.500)

## 2015-01-19 LAB — LACTIC ACID, PLASMA
LACTIC ACID, VENOUS: 1.6 mmol/L (ref 0.5–2.0)
Lactic Acid, Venous: 1 mmol/L (ref 0.5–2.0)

## 2015-01-19 LAB — CBG MONITORING, ED
GLUCOSE-CAPILLARY: 302 mg/dL — AB (ref 65–99)
GLUCOSE-CAPILLARY: 425 mg/dL — AB (ref 65–99)
Glucose-Capillary: 379 mg/dL — ABNORMAL HIGH (ref 65–99)
Glucose-Capillary: 198 mg/dL — ABNORMAL HIGH (ref 65–99)

## 2015-01-19 MED ORDER — SODIUM CHLORIDE 0.9 % IV BOLUS (SEPSIS)
1000.0000 mL | Freq: Once | INTRAVENOUS | Status: AC
  Administered 2015-01-19: 1000 mL via INTRAVENOUS

## 2015-01-19 NOTE — Discharge Instructions (Signed)
Monitor your blood sugars closely. Follow up with your doctors as needed.

## 2015-12-21 ENCOUNTER — Emergency Department (HOSPITAL_COMMUNITY): Payer: Self-pay

## 2015-12-21 ENCOUNTER — Inpatient Hospital Stay (HOSPITAL_COMMUNITY)
Admission: EM | Admit: 2015-12-21 | Discharge: 2015-12-23 | DRG: 639 | Disposition: A | Discharge status: Home / Self Care | Payer: Medicaid Other | Attending: Internal Medicine | Admitting: Internal Medicine

## 2015-12-21 ENCOUNTER — Encounter (HOSPITAL_COMMUNITY): Payer: Self-pay | Admitting: Cardiology

## 2015-12-21 DIAGNOSIS — E872 Acidosis, unspecified: Secondary | ICD-10-CM | POA: Diagnosis present

## 2015-12-21 DIAGNOSIS — E869 Volume depletion, unspecified: Secondary | ICD-10-CM | POA: Diagnosis present

## 2015-12-21 DIAGNOSIS — E0810 Diabetes mellitus due to underlying condition with ketoacidosis without coma: Secondary | ICD-10-CM | POA: Diagnosis not present

## 2015-12-21 DIAGNOSIS — E1110 Type 2 diabetes mellitus with ketoacidosis without coma: Principal | ICD-10-CM | POA: Diagnosis present

## 2015-12-21 DIAGNOSIS — Z79899 Other long term (current) drug therapy: Secondary | ICD-10-CM

## 2015-12-21 DIAGNOSIS — E119 Type 2 diabetes mellitus without complications: Secondary | ICD-10-CM | POA: Diagnosis not present

## 2015-12-21 DIAGNOSIS — Z794 Long term (current) use of insulin: Secondary | ICD-10-CM

## 2015-12-21 DIAGNOSIS — E1310 Other specified diabetes mellitus with ketoacidosis without coma: Secondary | ICD-10-CM

## 2015-12-21 DIAGNOSIS — E860 Dehydration: Secondary | ICD-10-CM | POA: Diagnosis present

## 2015-12-21 DIAGNOSIS — Z9114 Patient's other noncompliance with medication regimen: Secondary | ICD-10-CM

## 2015-12-21 DIAGNOSIS — Z87891 Personal history of nicotine dependence: Secondary | ICD-10-CM

## 2015-12-21 DIAGNOSIS — E0590 Thyrotoxicosis, unspecified without thyrotoxic crisis or storm: Secondary | ICD-10-CM | POA: Diagnosis present

## 2015-12-21 LAB — CBC WITH DIFFERENTIAL/PLATELET
Basophils Absolute: 0 10*3/uL (ref 0.0–0.1)
Basophils Relative: 1 %
EOS ABS: 0.1 10*3/uL (ref 0.0–0.7)
Eosinophils Relative: 3 %
HEMATOCRIT: 39.8 % (ref 39.0–52.0)
HEMOGLOBIN: 13.5 g/dL (ref 13.0–17.0)
LYMPHS ABS: 0.8 10*3/uL (ref 0.7–4.0)
Lymphocytes Relative: 17 %
MCH: 32.5 pg (ref 26.0–34.0)
MCHC: 33.9 g/dL (ref 30.0–36.0)
MCV: 95.7 fL (ref 78.0–100.0)
MONO ABS: 0.4 10*3/uL (ref 0.1–1.0)
MONOS PCT: 9 %
NEUTROS PCT: 70 %
Neutro Abs: 3.3 10*3/uL (ref 1.7–7.7)
Platelets: 146 10*3/uL — ABNORMAL LOW (ref 150–400)
RBC: 4.16 MIL/uL — ABNORMAL LOW (ref 4.22–5.81)
RDW: 12.7 % (ref 11.5–15.5)
WBC: 4.7 10*3/uL (ref 4.0–10.5)

## 2015-12-21 LAB — GLUCOSE, CAPILLARY
GLUCOSE-CAPILLARY: 239 mg/dL — AB (ref 65–99)
GLUCOSE-CAPILLARY: 239 mg/dL — AB (ref 65–99)

## 2015-12-21 LAB — COMPREHENSIVE METABOLIC PANEL
ALBUMIN: 3.2 g/dL — AB (ref 3.5–5.0)
ALK PHOS: 83 U/L (ref 38–126)
ALT: 20 U/L (ref 17–63)
AST: 25 U/L (ref 15–41)
Anion gap: 16 — ABNORMAL HIGH (ref 5–15)
BUN: 11 mg/dL (ref 6–20)
CHLORIDE: 100 mmol/L — AB (ref 101–111)
CO2: 17 mmol/L — AB (ref 22–32)
CREATININE: 0.93 mg/dL (ref 0.61–1.24)
Calcium: 7.7 mg/dL — ABNORMAL LOW (ref 8.9–10.3)
GFR calc non Af Amer: >60 mL/min (ref >60)
GLUCOSE: 615 mg/dL — AB (ref 65–99)
Potassium: 4.7 mmol/L (ref 3.5–5.1)
SODIUM: 133 mmol/L — AB (ref 135–145)
Total Bilirubin: 1.4 mg/dL — ABNORMAL HIGH (ref 0.3–1.2)
Total Protein: 5.9 g/dL — ABNORMAL LOW (ref 6.5–8.1)

## 2015-12-21 LAB — URINALYSIS, ROUTINE W REFLEX MICROSCOPIC
Bilirubin Urine: NEGATIVE
HGB URINE DIPSTICK: NEGATIVE
Ketones, ur: >80 mg/dL — AB
Leukocytes, UA: NEGATIVE
Nitrite: NEGATIVE
Protein, ur: NEGATIVE mg/dL
pH: 5.5 (ref 5.0–8.0)

## 2015-12-21 LAB — CBG MONITORING, ED
Glucose-Capillary: >600 mg/dL (ref 65–99)
Glucose-Capillary: 490 mg/dL — ABNORMAL HIGH (ref 65–99)
Glucose-Capillary: 410 mg/dL — ABNORMAL HIGH (ref 65–99)

## 2015-12-21 LAB — URINE MICROSCOPIC-ADD ON
Bacteria, UA: NONE SEEN
RBC / HPF: NONE SEEN RBC/hpf (ref 0–5)
WBC, UA: NONE SEEN WBC/hpf (ref 0–5)

## 2015-12-21 LAB — LIPASE, BLOOD: LIPASE: 17 U/L (ref 11–51)

## 2015-12-21 LAB — CK: Total CK: 134 U/L (ref 49–397)

## 2015-12-21 MED ORDER — SODIUM CHLORIDE 0.9 % IV SOLN: INTRAVENOUS | Status: DC

## 2015-12-21 MED ORDER — SODIUM CHLORIDE 0.9 % IV SOLN
INTRAVENOUS | Status: AC
  Filled 2015-12-21: qty 2.5

## 2015-12-21 MED ORDER — SODIUM CHLORIDE 0.9 % IV BOLUS (SEPSIS)
1000.0000 mL | Freq: Once | INTRAVENOUS | Status: AC
  Administered 2015-12-21: 1000 mL via INTRAVENOUS

## 2015-12-21 MED ORDER — POTASSIUM CHLORIDE 10 MEQ/100ML IV SOLN
10.0000 meq | INTRAVENOUS | Status: AC
  Administered 2015-12-21 – 2015-12-22 (×2): 10 meq via INTRAVENOUS
  Filled 2015-12-21 (×2): qty 100

## 2015-12-21 MED ORDER — FENTANYL CITRATE (PF) 100 MCG/2ML IJ SOLN
50.0000 ug | Freq: Once | INTRAMUSCULAR | Status: AC
  Administered 2015-12-21: 50 ug via INTRAVENOUS
  Filled 2015-12-21: qty 2

## 2015-12-21 MED ORDER — DEXTROSE-NACL 5-0.45 % IV SOLN
INTRAVENOUS | Status: DC
  Administered 2015-12-21: via INTRAVENOUS

## 2015-12-21 MED ORDER — ONDANSETRON HCL 4 MG/2ML IJ SOLN
4.0000 mg | Freq: Four times a day (QID) | INTRAMUSCULAR | Status: DC | PRN
  Administered 2015-12-23: 4 mg via INTRAVENOUS
  Filled 2015-12-21: qty 2

## 2015-12-21 MED ORDER — SODIUM CHLORIDE 0.9 % IV SOLN
INTRAVENOUS | Status: DC
  Administered 2015-12-21: 4.3 [IU]/h via INTRAVENOUS
  Filled 2015-12-21: qty 2.5

## 2015-12-21 MED ORDER — SODIUM CHLORIDE 0.9 % IV SOLN
INTRAVENOUS | Status: DC
  Administered 2015-12-21: 23:00:00 via INTRAVENOUS

## 2015-12-21 MED ORDER — ENOXAPARIN SODIUM 40 MG/0.4ML ~~LOC~~ SOLN
40.0000 mg | SUBCUTANEOUS | Status: DC
Start: 2015-12-22 — End: 2015-12-23
  Administered 2015-12-22: 40 mg via SUBCUTANEOUS
  Filled 2015-12-21: qty 0.4

## 2015-12-21 MED ORDER — SODIUM CHLORIDE 0.9 % IV SOLN
INTRAVENOUS | Status: DC
  Administered 2015-12-21: 1.8 [IU]/h via INTRAVENOUS
  Filled 2015-12-21: qty 2.5

## 2015-12-21 NOTE — ED Notes (Signed)
Called [facility] for insulin drip

## 2015-12-21 NOTE — H&P (Signed)
[facility] History and Physical  Kenneth [name]:[id number] DOB: [birth year] DOA: 12/21/2015  Referring physician: Dr Christy Gentles PCP: No PCP Per Patient   Chief Complaint: Nausea, vomiting  HPI: Kenneth [name] is a 50 y.o. male with history of DM2, DKA and thyroid disease presents with N/V for last 24 hours.  Doesn't have money to buy his insulin. BS > 600, AG 16, serum CO2 is 10.  Asked to see for DKA admission.    Patient has hx DM2 for about 10 years.  Had 3 admits here last year for DKA or HONK.  Used to be that his mother and sister would help him out with food and medications, but his mother and sister died this year, he has other relatives but none in [place].  His church members are helping him out , he is living in an apt complex for the "disabled".  Says he has been turned down thought by disability and Medicaid 3 times each.  Has another petition for disability in the works.  Has no PCP, no money and no insurance.  Last worked 4-5 yrs ago.    May issue last 24-48 hrs is N/V and some abd pain.  Also c/o bilat CP, fell down and hurt his ribs.  No prod cough or hemoptysis.  No SOB.       Chart review: May '16 - DKA, rx IV insulin, no insulin taken for several days PTA July '16 - BS > 600, not in DKA, rx with lantus and IVF"s.  Improved.  Given MATCH voucher for medications at dc. Lantus cahnged to 70/30 insulin to make more affordable.  Sept '16 - ran out of insulin at home, presented in DKA.  Rx IVF"s and IV insulin.  DC"s to f/u with the health dept.  70/30 insulin, 15 units w breakfast and 10 u w dinner.  Tapazole and neurontin other meds.  ROS  no joint pain   no HA  + blurry vision  no rash  no dysuria  no difficulty voiding  no change in urine color    Past Medical History  Past Medical History:  Diagnosis Date  . Arthritis   . Diabetes mellitus   . DKA (diabetic ketoacidosis) ([place]) 07/2014  . Malnutrition ([place])   . Noncompliance with medication regimen    . Thyroid disease    Past Surgical History History reviewed. No pertinent surgical history. Family History History reviewed. No pertinent family history. Social History  reports that he has been smoking.  He has a 4.00 pack-year smoking history. He has never used smokeless tobacco. He reports that he does not drink alcohol or use drugs. Allergies No Known Allergies Home medications Prior to Admission medications   Medication Sig Start Date End Date Taking? Authorizing Provider  gabapentin (NEURONTIN) 300 MG capsule Take 2 capsules (600 mg total) by mouth 3 (three) times daily as needed (leg pain). 09/22/14  Yes Kathie Dike, MD  insulin aspart protamine- aspart (NOVOLOG MIX 70/30) (70-30) 100 UNIT/ML injection Take 15units [place] qam with breakfast and 10units [place] qpm with dinner Patient taking differently: Inject 10-15 Units into the skin 2 (two) times daily with a meal. Take 15 units in the morning with breakfast and 10 units in the evening with dinner 09/22/14  Yes Kathie Dike, MD  insulin lispro (HUMALOG) 100 UNIT/ML injection Inject 8 Units into the skin daily as needed for high blood sugar.    Yes Historical Provider, MD  blood glucose meter kit and  supplies KIT Dispense based on patient and insurance preference. Use up to four times daily as directed. (FOR ICD-9 250.00, 250.01). 09/22/14   Kathie Dike, MD  Insulin Syringe-Needle U-100 30G X 1/2" 0.5 ML MISC Use to inject insulin subcutaneously BID 09/22/14   Kathie Dike, MD  methimazole (TAPAZOLE) 5 MG tablet Take 1 tablet (5 mg total) by mouth 2 (two) times daily. Patient not taking: Reported on 01/18/2015 09/22/14   Kathie Dike, MD   Liver Function Tests  Recent Labs Lab 12/21/15 1850  AST 25  ALT 20  ALKPHOS 83  BILITOT 1.4*  PROT 5.9*  ALBUMIN 3.2*    Recent Labs Lab 12/21/15 1850  LIPASE 17   CBC  Recent Labs Lab 12/21/15 1850  WBC 4.7  NEUTROABS 3.3  HGB 13.5  HCT 39.8  MCV 95.7  PLT 146*   Basic  Metabolic Panel  Recent Labs Lab 12/21/15 1850  NA 133*  K 4.7  CL 100*  CO2 17*  GLUCOSE 615*  BUN 11  CREATININE 0.93  CALCIUM 7.7*     Vitals:   12/21/15 2020 12/21/15 2030 12/21/15 2100 12/21/15 2145  BP: 111/67 98/56 129/78   Pulse: (!) 55 (!) 54 81   Resp: 17 14 (!) 38   Temp:    98 F (36.7 C)  TempSrc:    Oral  SpO2: 100% 99% 99%   Weight:    54.9 kg (121 lb 0.5 oz)  Height:    _0  (1.753 m)   Exam: Gen thin pleasant WM no distress No rash, cyanosis or gangrene Sclera anicteric, throat clear and quite dry  No jvd or bruits Chest clear bilat RRR no MRG Abd soft no mass or ascites +bs, scaphoid, mild diffuse tenderness, no rebound GU normal male MS no joint effusions or deformity Ext no leg or UE edema / no wounds or ulcers Neuro is alert, Ox 3 , nf   Na 133 K 4.7  BUN 11 Cr 0.93  CO2 11 Anion gap 16  Ca 7.7  Alb 3.2  LFT"s ok Glu 615 WBC 4k  Hb 13  plt 146  UA ++ketones and glucose, o/w neg   EKG (independ reviewed) > NSR, 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 DM2 with DKA - poor social situation and no insurance, no way to pay for medications. Is supposed to be on 70/30 insulin 15 u w breakfast and 10 u w dinner.  2. Hyperthyroid - was on Tapazole, not sure if taking this or not now 3. Vol depletion 4. Met acidosis - from #1  Plan - IV insulin protocol, IVF's, antiemetics prn, clear liquids and advance as tolerated     Kenneth [name] D [facility] Pager [phone]   If 7PM-7AM, please contact night-coverage [email or web address] Password [email or web address] 12/21/2015,10:52 PM

## 2015-12-21 NOTE — ED Notes (Signed)
MD at bedside. 

## 2015-12-21 NOTE — ED Triage Notes (Signed)
Vomiting since yesterday.  Abdominal pain.  CBG  reads high at home.  Urinated times 1 since yesterday.  CBG with EMS 585.  VSS

## 2015-12-21 NOTE — ED Notes (Signed)
CRITICAL VALUE ALERT  Critical value received:  Glucose 615  Date of notification:  12/21/15  Time of notification:  1935  Critical value read back:Yes.    Nurse who received alert:  B. Garner Nashaniels, RN  MD notified (1st page):  [place]  Time of first page:  [phone]

## 2015-12-21 NOTE — H&P (Deleted)
[facility] History and Physical  Kenneth [name]:[id number] DOB: [birth year] DOA: 12/21/2015  Referring physician: Dr Christy Gentles PCP: No PCP Per Patient   Chief Complaint: Nausea, vomiting  HPI: Kenneth [name] is a 50 y.o. male with history of DM2, DKA and thyroid disease presents with N/V for last 24 hours.  Doesn't have money to buy his insulin. BS > 600, AG 16, serum CO2 is 10.  Asked to see for DKA admission.    Patient has hx DM2 for about 10 years.  Had 3 admits here last year for DKA or HONK.  Used to be that his mother and sister would help him out with food and medications, but his mother and sister died this year, he has other relatives but none in [place].  His church members are helping him out , he is living in an apt complex for the "disabled".  Says he has been turned down thought by disability and Medicaid 3 times each.  Has another petition for disability in the works.  Has no PCP, no money and no insurance.  Last worked 4-5 yrs ago.    May issue last 24-48 hrs is N/V and some abd pain.  Also c/o bilat CP, fell down and hurt his ribs.  No prod cough or hemoptysis.  No SOB.       Chart review: May '16 - DKA, rx IV insulin, no insulin taken for several days PTA July '16 - BS > 600, not in DKA, rx with lantus and IVF"s.  Improved.  Given MATCH voucher for medications at dc. Lantus cahnged to 70/30 insulin to make more affordable.  Sept '16 - ran out of insulin at home, presented in DKA.  Rx IVF"s and IV insulin.  DC"s to f/u with the health dept.  70/30 insulin, 15 units w breakfast and 10 u w dinner.  Tapazole and neurontin other meds.  ROS  no joint pain   no HA  + blurry vision  no rash  no dysuria  no difficulty voiding  no change in urine color    Past Medical History  Past Medical History:  Diagnosis Date  . Arthritis   . Diabetes mellitus   . DKA (diabetic ketoacidosis) ([place]) 07/2014  . Malnutrition ([place])   . Noncompliance with medication regimen    . Thyroid disease    Past Surgical History History reviewed. No pertinent surgical history. Family History History reviewed. No pertinent family history. Social History  reports that he has been smoking.  He has a 4.00 pack-year smoking history. He has never used smokeless tobacco. He reports that he does not drink alcohol or use drugs. Allergies No Known Allergies Home medications Prior to Admission medications   Medication Sig Start Date End Date Taking? Authorizing Provider  gabapentin (NEURONTIN) 300 MG capsule Take 2 capsules (600 mg total) by mouth 3 (three) times daily as needed (leg pain). 09/22/14  Yes Kathie Dike, MD  insulin aspart protamine- aspart (NOVOLOG MIX 70/30) (70-30) 100 UNIT/ML injection Take 15units [place] qam with breakfast and 10units [place] qpm with dinner Patient taking differently: Inject 10-15 Units into the skin 2 (two) times daily with a meal. Take 15 units in the morning with breakfast and 10 units in the evening with dinner 09/22/14  Yes Kathie Dike, MD  insulin lispro (HUMALOG) 100 UNIT/ML injection Inject 8 Units into the skin daily as needed for high blood sugar.    Yes Historical Provider, MD  blood glucose meter kit and  supplies KIT Dispense based on patient and insurance preference. Use up to four times daily as directed. (FOR ICD-9 250.00, 250.01). 09/22/14   Kathie Dike, MD  Insulin Syringe-Needle U-100 30G X 1/2" 0.5 ML MISC Use to inject insulin subcutaneously BID 09/22/14   Kathie Dike, MD  methimazole (TAPAZOLE) 5 MG tablet Take 1 tablet (5 mg total) by mouth 2 (two) times daily. Patient not taking: Reported on 01/18/2015 09/22/14   Kathie Dike, MD   Liver Function Tests  Recent Labs Lab 12/21/15 1850  AST 25  ALT 20  ALKPHOS 83  BILITOT 1.4*  PROT 5.9*  ALBUMIN 3.2*    Recent Labs Lab 12/21/15 1850  LIPASE 17   CBC  Recent Labs Lab 12/21/15 1850  WBC 4.7  NEUTROABS 3.3  HGB 13.5  HCT 39.8  MCV 95.7  PLT 146*   Basic  Metabolic Panel  Recent Labs Lab 12/21/15 1850  NA 133*  K 4.7  CL 100*  CO2 17*  GLUCOSE 615*  BUN 11  CREATININE 0.93  CALCIUM 7.7*     Vitals:   12/21/15 2020 12/21/15 2030 12/21/15 2100 12/21/15 2145  BP: 111/67 98/56 129/78   Pulse: (!) 55 (!) 54 81   Resp: 17 14 (!) 38   Temp:    98 F (36.7 C)  TempSrc:    Oral  SpO2: 100% 99% 99%   Weight:    54.9 kg (121 lb 0.5 oz)  Height:    5' 9" (1.753 m)   Exam: Gen thin pleasant WM no distress No rash, cyanosis or gangrene Sclera anicteric, throat clear and quite dry  No jvd or bruits Chest clear bilat RRR no MRG Abd soft no mass or ascites +bs, scaphoid, mild diffuse tenderness, no rebound GU normal male MS no joint effusions or deformity Ext no leg or UE edema / no wounds or ulcers Neuro is alert, Ox 3 , nf   Na 133 K 4.7  BUN 11 Cr 0.93  CO2 11 Anion gap 16  Ca 7.7  Alb 3.2  LFT"s ok Glu 615 WBC 4k  Hb 13  plt 146  UA ++ketones and glucose, o/w neg   EKG (independ reviewed) > NSR, 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 DM2 with DKA - poor social situation and no insurance, no way to pay for medications. Is supposed to be on 70/30 insulin 15 u w breakfast and 10 u w dinner.  2. Hyperthyroid - was on Tapazole, not sure if taking this or not now 3. Vol depletion 4. Met acidosis - from #1  Plan - IV insulin protocol, IVF's, antiemetics prn, clear liquids and advance as tolerated     Kenneth [name] [facility] Pager [phone]   If 7PM-7AM, please contact night-coverage [email or web address] Password [email or web address] 12/21/2015, 10:20 PM

## 2015-12-21 NOTE — ED Provider Notes (Signed)
[facility] DEPT Provider Note   CSN: [id number] Arrival date & time: 12/21/15  1735    History   Chief Complaint Chief Complaint  Patient presents with  . Emesis    HPI Kenneth [name] is a 50 y.o. male.  The history is provided by the patient.  Emesis   This is a new problem. The current episode started yesterday. The problem has been gradually worsening. There has been no fever. Associated symptoms include abdominal pain, chills and myalgias. Pertinent negatives include no diarrhea.  Patient with h/o diabetes who presents with vomiting over past 24 hours He denies diarrhea No fever He reports his glucose is elevated He reports diffuse body aches He reports he may have had a seizure today, reports previous h/o seizures  He reports he may have also fallen recently but denies any known injury  PMH - diabetes Soc hx - denies drug/ETOH use  Past Medical History:  Diagnosis Date  . Arthritis   . Diabetes mellitus   . DKA (diabetic ketoacidosis) ([place]) 07/2014  . Malnutrition ([place])   . Noncompliance with medication regimen   . Thyroid disease     Patient Active Problem List   Diagnosis Date Noted  . Hyperglycemia   . Diabetes mellitus with nonketotic hyperosmolarity ([place]) 09/21/2014  . Diabetes mellitus ([place]) 09/21/2014  . Noncompliance with medication regimen   . DM (diabetes mellitus) ([place])   . Diabetes mellitus due to underlying condition with hyperosmolarity without coma ([place])   . DKA (diabetic ketoacidoses) ([place]) 07/24/2014  . Dehydration 07/24/2014  . Acute renal failure ([place]) 07/24/2014  . Hyperkalemia 07/24/2014  . Hyponatremia 07/24/2014  . Hyperthyroidism 07/24/2014    History reviewed. No pertinent surgical history.     Home Medications    Prior to Admission medications   Medication Sig Start Date End Date Taking? Authorizing Provider  blood glucose meter kit and supplies KIT Dispense based on patient and insurance preference. Use up to four  times daily as directed. (FOR ICD-9 250.00, 250.01). 09/22/14   Kathie Dike, MD  gabapentin (NEURONTIN) 300 MG capsule Take 2 capsules (600 mg total) by mouth 3 (three) times daily as needed (leg pain). Patient not taking: Reported on 01/18/2015 09/22/14   Kathie Dike, MD  insulin aspart protamine- aspart (NOVOLOG MIX 70/30) (70-30) 100 UNIT/ML injection Take 15units [place] qam with breakfast and 10units [place] qpm with dinner Patient taking differently: Inject 10-15 Units into the skin 2 (two) times daily with a meal. Take 15 units in the morning with breakfast and 10 units in the evening with dinner 09/22/14   Kathie Dike, MD  insulin lispro (HUMALOG) 100 UNIT/ML injection Inject into the skin once as needed for high blood sugar.    Historical Provider, MD  Insulin Syringe-Needle U-100 30G X 1/2" 0.5 ML MISC Use to inject insulin subcutaneously BID 09/22/14   Kathie Dike, MD  methimazole (TAPAZOLE) 5 MG tablet Take 1 tablet (5 mg total) by mouth 2 (two) times daily. Patient not taking: Reported on 01/18/2015 09/22/14   Kathie Dike, MD    Family History History reviewed. No pertinent family history.  Social History Social History  Substance Use Topics  . Smoking status: Current Every Day Smoker    Packs/day: 0.50    Years: 8.00  . Smokeless tobacco: Never Used  . Alcohol use No     Allergies   Review of patient's allergies indicates no known allergies.   Review of Systems Review of Systems  Constitutional: Positive for  chills and fatigue.  Eyes:       Blurred vision   Cardiovascular:       Chest wall pain   Gastrointestinal: Positive for abdominal pain and vomiting. Negative for diarrhea.  Genitourinary: Positive for decreased urine volume.  Musculoskeletal: Positive for myalgias.  All other systems reviewed and are negative.    Physical Exam Updated Vital Signs BP 109/74   Pulse 60   Resp 14   Ht 5' 7" (1.702 m)   Wt 65.8 kg   SpO2 100%   BMI 22.71 kg/m    Physical Exam CONSTITUTIONAL: Disheveled, smells of ketones, anxious HEAD: Normocephalic/atraumatic EYES: EOMI/PERRL ENMT: Mucous membranes dry, poor dentition NECK: supple no meningeal signs SPINE/BACK:entire spine nontender CV: S1/S2 noted, no murmurs/rubs/gallops noted LUNGS: Lungs are clear to auscultation bilaterally, no apparent distress Chest - diffuse chest wall tenderness but no crepitus or bruising noted.   ABDOMEN: soft, mild diffuse tenderness noted NEURO: Pt is awake/alert moves all extremitiesx4.  No facial droop.  Pt is moaning frequently and thrashing around in the bed EXTREMITIES: pulses normal/equalx4, full ROM, no LE edema, no deformities SKIN: warm, color normal PSYCH: anxious   ED Treatments / Results  Labs (all labs ordered are listed, but only abnormal results are displayed) Labs Reviewed  COMPREHENSIVE METABOLIC PANEL - Abnormal; Notable for the following:       Result Value   Sodium 133 (*)    Chloride 100 (*)    CO2 17 (*)    Glucose, Bld 615 (*)    Calcium 7.7 (*)    Total Protein 5.9 (*)    Albumin 3.2 (*)    Total Bilirubin 1.4 (*)    Anion gap 16 (*)    All other components within normal limits  CBC WITH DIFFERENTIAL/PLATELET - Abnormal; Notable for the following:    RBC 4.16 (*)    Platelets 146 (*)    All other components within normal limits  URINALYSIS, ROUTINE W REFLEX MICROSCOPIC (NOT AT [facility]) - Abnormal; Notable for the following:    Specific Gravity, Urine <1.005 (*)    Glucose, UA >1000 (*)    Ketones, ur >80 (*)    All other components within normal limits  URINE MICROSCOPIC-ADD ON - Abnormal; Notable for the following:    Squamous Epithelial / LPF 0-5 (*)    All other components within normal limits  CBG MONITORING, ED - Abnormal; Notable for the following:    Glucose-Capillary >600 (*)    All other components within normal limits  CBG MONITORING, ED - Abnormal; Notable for the following:    Glucose-Capillary 490 (*)     All other components within normal limits  LIPASE, BLOOD  CK    EKG  EKG Interpretation  Date/Time:  Sunday December 21 2015 17:45:51 EDT Ventricular Rate:  58 PR Interval:    QRS Duration: 80 QT Interval:  453 QTC Calculation: 445 R Axis:   114 Text Interpretation:  Sinus rhythm Lateral infarct, old Probable anteroseptal infarct, old No significant change since last tracing Confirmed by Christy Gentles  MD, [place] ([id number]) on 12/21/2015 6:44:01 PM       Radiology Dg Chest Portable 1 View  Result Date: 12/21/2015 CLINICAL DATA:  Pain and vomiting for 1 day EXAM: PORTABLE CHEST 1 VIEW COMPARISON:  01/19/2015 FINDINGS: Cardiac shadow is within normal limits. The lungs are well aerated bilaterally. No focal infiltrate or sizable effusion is seen. No pneumothorax is noted. No acute bony abnormality is seen. IMPRESSION: No  active disease. Electronically Signed   By: Inez Catalina M.D.   On: 12/21/2015 18:27    Procedures Procedures  CRITICAL CARE Performed by: Sharyon Cable Total critical care time: 40 minutes Critical care time was exclusive of separately billable procedures and treating other patients. Critical care was necessary to treat or prevent imminent or life-threatening deterioration. Critical care was time spent personally by me on the following activities: development of treatment plan with patient and/or surrogate as well as nursing, discussions with consultants, evaluation of patient's response to treatment, examination of patient, obtaining history from patient or surrogate, ordering and performing treatments and interventions, ordering and review of laboratory studies, ordering and review of radiographic studies, pulse oximetry and re-evaluation of patient's condition. PATIENT WITH DIABETIC KETOACIDOSIS REQUIRING IV FLUIDS AND IV INSULIN PATIENT ALSO WITH GLUCOSE >600 REQUIRING ADMISSION   Medications Ordered in ED Medications  insulin regular (NOVOLIN R,HUMULIN R) 250  Units in sodium chloride 0.9 % 250 mL (1 Units/mL) infusion (4.3 Units/hr Intravenous New Bag/Given 12/21/15 2008)  fentaNYL (SUBLIMAZE) injection 50 mcg (not administered)  sodium chloride 0.9 % bolus 1,000 mL (0 mLs Intravenous Stopped 12/21/15 1905)  fentaNYL (SUBLIMAZE) injection 50 mcg (50 mcg Intravenous Given 12/21/15 1809)  sodium chloride 0.9 % bolus 1,000 mL (1,000 mLs Intravenous New Bag/Given 12/21/15 1905)     Initial Impression / Assessment and Plan / ED Course  I have reviewed the triage vital signs and the nursing notes.  Pertinent labs & imaging results that were available during my care of the patient were reviewed by me and considered in my medical decision making (see chart for details).  Clinical Course    5:59 PM Pt from home for hyperglycemia, vomiting and diffuse myalgias Pt currently hemodynamically appropriate.  Will need IV fluids Will follow closely 7:38 PM Pt with early DKA He will likely need admission for IV fluids and insulin Pt awake/alert, maex4.  He reports diffuse body pain, but otherwise no distress noted.  He has diffuse tenderness throughout exam Will admit 8:17 PM D/W DR SCHERTZ, WILL ADMIT TO STEPDOWN UNIT I SUSPECT PATIENT WILL TURN AROUND QUICKLY WITH IV FLUIDS/INSULIN PT AGREEABLE WITH PLAN  Final Clinical Impressions(s) / ED Diagnoses   Final diagnoses:  Diabetic ketoacidosis without coma associated with other specified diabetes mellitus ([place])  Dehydration    New Prescriptions New Prescriptions   No medications on file     Ripley Fraise, MD 12/21/15 2018

## 2015-12-22 DIAGNOSIS — E0910 Drug or chemical induced diabetes mellitus with ketoacidosis without coma: Secondary | ICD-10-CM | POA: Diagnosis not present

## 2015-12-22 DIAGNOSIS — E0590 Thyrotoxicosis, unspecified without thyrotoxic crisis or storm: Secondary | ICD-10-CM

## 2015-12-22 DIAGNOSIS — E119 Type 2 diabetes mellitus without complications: Secondary | ICD-10-CM | POA: Diagnosis not present

## 2015-12-22 DIAGNOSIS — E872 Acidosis: Secondary | ICD-10-CM | POA: Diagnosis not present

## 2015-12-22 LAB — GLUCOSE, CAPILLARY
GLUCOSE-CAPILLARY: 231 mg/dL — AB (ref 65–99)
GLUCOSE-CAPILLARY: 272 mg/dL — AB (ref 65–99)
Glucose-Capillary: 227 mg/dL — ABNORMAL HIGH (ref 65–99)
Glucose-Capillary: 172 mg/dL — ABNORMAL HIGH (ref 65–99)
Glucose-Capillary: 124 mg/dL — ABNORMAL HIGH (ref 65–99)
Glucose-Capillary: 117 mg/dL — ABNORMAL HIGH (ref 65–99)
Glucose-Capillary: 86 mg/dL (ref 65–99)
Glucose-Capillary: 410 mg/dL — ABNORMAL HIGH (ref 65–99)
Glucose-Capillary: 198 mg/dL — ABNORMAL HIGH (ref 65–99)
Glucose-Capillary: 229 mg/dL — ABNORMAL HIGH (ref 65–99)

## 2015-12-22 LAB — BASIC METABOLIC PANEL
ANION GAP: 6 (ref 5–15)
ANION GAP: 4 — AB (ref 5–15)
Anion gap: 4 — ABNORMAL LOW (ref 5–15)
Anion gap: 4 — ABNORMAL LOW (ref 5–15)
BUN: 8 mg/dL (ref 6–20)
BUN: 9 mg/dL (ref 6–20)
BUN: 8 mg/dL (ref 6–20)
BUN: 9 mg/dL (ref 6–20)
CALCIUM: 7.3 mg/dL — AB (ref 8.9–10.3)
CALCIUM: 7.7 mg/dL — AB (ref 8.9–10.3)
CO2: 24 mmol/L (ref 22–32)
CO2: 24 mmol/L (ref 22–32)
CO2: 25 mmol/L (ref 22–32)
CO2: 28 mmol/L (ref 22–32)
CREATININE: 0.63 mg/dL (ref 0.61–1.24)
CREATININE: 0.58 mg/dL — AB (ref 0.61–1.24)
Calcium: 8 mg/dL — ABNORMAL LOW (ref 8.9–10.3)
Calcium: 8.1 mg/dL — ABNORMAL LOW (ref 8.9–10.3)
Chloride: 107 mmol/L (ref 101–111)
Chloride: 110 mmol/L (ref 101–111)
Chloride: 103 mmol/L (ref 101–111)
Chloride: 102 mmol/L (ref 101–111)
Creatinine, Ser: 0.58 mg/dL — ABNORMAL LOW (ref 0.61–1.24)
Creatinine, Ser: 0.64 mg/dL (ref 0.61–1.24)
GFR calc non Af Amer: >60 mL/min (ref >60)
Glucose, Bld: 242 mg/dL — ABNORMAL HIGH (ref 65–99)
Glucose, Bld: 112 mg/dL — ABNORMAL HIGH (ref 65–99)
Glucose, Bld: 217 mg/dL — ABNORMAL HIGH (ref 65–99)
Glucose, Bld: 186 mg/dL — ABNORMAL HIGH (ref 65–99)
POTASSIUM: 3.6 mmol/L (ref 3.5–5.1)
POTASSIUM: 3.9 mmol/L (ref 3.5–5.1)
Potassium: 3.5 mmol/L (ref 3.5–5.1)
Potassium: 3.4 mmol/L — ABNORMAL LOW (ref 3.5–5.1)
SODIUM: 135 mmol/L (ref 135–145)
SODIUM: 138 mmol/L (ref 135–145)
SODIUM: 134 mmol/L — AB (ref 135–145)
SODIUM: 134 mmol/L — AB (ref 135–145)

## 2015-12-22 LAB — TSH: TSH: 1.373 u[IU]/mL (ref 0.350–4.500)

## 2015-12-22 LAB — T4, FREE: Free T4: 0.87 ng/dL (ref 0.61–1.12)

## 2015-12-22 LAB — MRSA PCR SCREENING: MRSA by PCR: NEGATIVE

## 2015-12-22 MED ORDER — HYDROCODONE-ACETAMINOPHEN 5-325 MG PO TABS
1.0000 | ORAL_TABLET | ORAL | Status: DC | PRN
  Administered 2015-12-22 – 2015-12-23 (×8): 2 via ORAL
  Filled 2015-12-22 (×8): qty 2

## 2015-12-22 MED ORDER — INFLUENZA VAC SPLIT QUAD 0.5 ML IM SUSY: 0.5000 mL | PREFILLED_SYRINGE | INTRAMUSCULAR | Status: DC

## 2015-12-22 MED ORDER — POTASSIUM CHLORIDE IN NACL 20-0.45 MEQ/L-% IV SOLN
INTRAVENOUS | Status: DC
  Administered 2015-12-22 (×2): via INTRAVENOUS
  Filled 2015-12-22 (×6): qty 1000

## 2015-12-22 MED ORDER — INSULIN ASPART 100 UNIT/ML ~~LOC~~ SOLN
0.0000 [IU] | Freq: Every day | SUBCUTANEOUS | Status: DC
  Administered 2015-12-22: 2 [IU] via SUBCUTANEOUS

## 2015-12-22 MED ORDER — INSULIN ASPART 100 UNIT/ML ~~LOC~~ SOLN
0.0000 [IU] | Freq: Three times a day (TID) | SUBCUTANEOUS | Status: DC
  Administered 2015-12-22: 15 [IU] via SUBCUTANEOUS
  Administered 2015-12-22: 3 [IU] via SUBCUTANEOUS
  Administered 2015-12-23: 11 [IU] via SUBCUTANEOUS

## 2015-12-22 MED ORDER — INSULIN ASPART 100 UNIT/ML ~~LOC~~ SOLN: 4.0000 [IU] | Freq: Three times a day (TID) | SUBCUTANEOUS | Status: DC

## 2015-12-22 MED ORDER — INSULIN GLARGINE 100 UNIT/ML ~~LOC~~ SOLN
12.0000 [IU] | Freq: Every day | SUBCUTANEOUS | Status: DC
  Administered 2015-12-22 – 2015-12-23 (×2): 12 [IU] via SUBCUTANEOUS
  Filled 2015-12-22 (×3): qty 0.12

## 2015-12-22 MED ORDER — INSULIN GLARGINE 100 UNIT/ML ~~LOC~~ SOLN: 15.0000 [IU] | Freq: Every day | SUBCUTANEOUS | Status: DC

## 2015-12-22 MED ORDER — INSULIN GLARGINE 100 UNIT/ML ~~LOC~~ SOLN: 12.0000 [IU] | Freq: Every day | SUBCUTANEOUS | Status: DC

## 2015-12-22 NOTE — Progress Notes (Signed)
PROGRESS NOTE    Kenneth [name]  ZOX:[id number] DOB: May 02, 1965 DOA: 12/21/2015 PCP: No PCP Per Patient    Brief Narrative: Patient with hx of DM, DKA and HONK, hyperthyroidism, non compliant, admitted for DKA with AG 16, bicarb 10 and BS 600 due to non compliant.  He was given IV Insulin, and his gap closed to 4, normal Cr and K of 3.4.  His insulin drip was stopped and he is now on sliding scale with basal insulin of 12 units.     Assessment & Plan:   Principal Problem:   DKA (diabetic ketoacidoses) (HCC) Active Problems:   Hyperthyroidism   DM type 2, goal HbA1c 7%-8% (HCC)   Volume depletion   Metabolic acidosis   1. DKA:  Will continue with IVF and K supplement.  Check BMET every 12 hours now.  Transfer to med surg and plan d/c home tomorrow.  2. Hyperthyroidism:  Previously on Tapazole.  WIll check thyroid level.  DVT prophylaxis: Levonox Code Status: FULL CODE.  Family Communication: None. Disposition Plan: To home, likely tomorrow.   Consultants:   None.   Procedures:   None.   Antimicrobials: Anti-infectives    None       Subjective:  Feeling better.  No complaints.   Objective: Vitals:   12/22/15 0400 12/22/15 0500 12/22/15 0700 12/22/15 0802  BP: (!) 86/56 102/75 (!) 86/66   Pulse:      Resp: 20 14 17    Temp: 98 F (36.7 C)   98.2 F (36.8 C)  TempSrc: Oral   Oral  SpO2:      Weight:      Height:        Intake/Output Summary (Last 24 hours) at 12/22/15 0853 Last data filed at 12/22/15 0600  Gross per 24 hour  Intake          4143.33 ml  Output              400 ml  Net          3743.33 ml   Filed Weights   12/21/15 1741 12/21/15 2145  Weight: 65.8 kg (145 lb) 54.9 kg (121 lb 0.5 oz)    Examination:  General exam: Appears calm and comfortable  Respiratory system: Clear to auscultation. Respiratory effort normal. Cardiovascular system: S1 & S2 heard, RRR. No JVD, murmurs, rubs, gallops or clicks. No pedal edema. Gastrointestinal  system: Abdomen is nondistended, soft and nontender. No organomegaly or masses felt. Normal bowel sounds heard. Central nervous system: Alert and oriented. No focal neurological deficits. Extremities: Symmetric 5 x 5 power. Skin: No rashes, lesions or ulcers Psychiatry: Judgement and insight appear normal. Mood & affect appropriate.   Data Reviewed: I have personally reviewed following labs and imaging studies  CBC:  Recent Labs Lab 12/21/15 1850  WBC 4.7  NEUTROABS 3.3  HGB 13.5  HCT 39.8  MCV 95.7  PLT 146*   Basic Metabolic Panel:  Recent Labs Lab 12/21/15 1850 12/22/15 0130 12/22/15 0442  NA 133* 135 138  K 4.7 3.5 3.4*  CL 100* 107 110  CO2 17* 24 24  GLUCOSE 615* 242* 112*  BUN 11 8 9   CREATININE 0.93 0.63 0.58*  CALCIUM 7.7* 7.3* 7.7*   GFR: Estimated Creatinine Clearance: 83.9 mL/min (by C-G formula based on SCr of 0.58 mg/dL (L)). Liver Function Tests:  Recent Labs Lab 12/21/15 1850  AST 25  ALT 20  ALKPHOS 83  BILITOT 1.4*  PROT 5.9*  ALBUMIN  3.2*    Recent Labs Lab 12/21/15 1850  LIPASE 17   No results for input(s): AMMONIA in the last 168 hours. Coagulation Profile: No results for input(s): INR, PROTIME in the last 168 hours. Cardiac Enzymes:  Recent Labs Lab 12/21/15 1850  CKTOTAL 134   CBG:  Recent Labs Lab 12/22/15 0231 12/22/15 0330 12/22/15 0432 12/22/15 0559 12/22/15 0720  GLUCAP 231* 172* 124* 117* 86   Lipid Profile:   Recent Results (from the past 240 hour(s))  MRSA PCR Screening     Status: None   Collection Time: 12/21/15  9:39 PM  Result Value Ref Range Status   MRSA by PCR NEGATIVE NEGATIVE Final    Comment:        The GeneXpert MRSA Assay (FDA approved for NASAL specimens only), is one component of a comprehensive MRSA colonization surveillance program. It is not intended to diagnose MRSA infection nor to guide or monitor treatment for MRSA infections.      Radiology Studies: Dg Chest Portable  1 View  Result Date: 12/21/2015 CLINICAL DATA:  Pain and vomiting for 1 day EXAM: PORTABLE CHEST 1 VIEW COMPARISON:  01/19/2015 FINDINGS: Cardiac shadow is within normal limits. The lungs are well aerated bilaterally. No focal infiltrate or sizable effusion is seen. No pneumothorax is noted. No acute bony abnormality is seen. IMPRESSION: No active disease. Electronically Signed   By: Alcide CleverMark  Lukens M.D.   On: 12/21/2015 18:27    Scheduled Meds: . enoxaparin (LOVENOX) injection  40 mg Subcutaneous Q24H  . [START ON 12/23/2015] Influenza vac split quadrivalent PF  0.5 mL Intramuscular Tomorrow-1000  . insulin aspart  0-15 Units Subcutaneous TID WC  . insulin aspart  0-5 Units Subcutaneous QHS  . insulin glargine  12 Units Subcutaneous Daily   Continuous Infusions: . sodium chloride 100 mL (12/22/15 0840)     LOS: 0 days   Khalik Pewitt, MD FACP Hospitalist.   If 7PM-7AM, please contact night-coverage [email or web address] Password [email or web address] 12/22/2015, 8:53 AM

## 2015-12-22 NOTE — Care Management Note (Signed)
Case Management Note  Patient Details  Name: Kenneth RainwaterRandy C [name] MRN: [id number] Date of Birth: 06/09/1965  Subjective/Objective:                  Pt is from home, admitted with DKA. He lives with family. He is ind with ADL's. He has seen [facility] in the past and they are again aware he is in the hospital. Pt goes to the [facility] for PCP care. [facility]. Pt has not received MATCH voucher in the past year and was give one at his request. Pt plans to return home with self care at DC.   Action/Plan: No further needs anticipated.   Expected Discharge Date:      12/23/2015            Expected Discharge Plan:  Home/Self Care  In-House Referral:  Financial Counselor  Discharge planning Services  [facility]  Post Acute Care Choice:  NA Choice offered to:  NA  DME Arranged:    DME Agency:     HH Arranged:    HH Agency:     Status of Service:  Completed, signed off  If discussed at [employer] Meetings, dates discussed:    Additional Comments:  Malcolm MetroChildress, Ima Hafner Demske, RN 12/22/2015, 2:27 PM

## 2015-12-22 NOTE — Clinical Social Work Note (Signed)
CSW received consult for medication assistance. Will notify CM. CSW signing off, but can be reconsulted if needed.  Lavora Brisbon, LCSW [phone] 

## 2015-12-22 NOTE — Progress Notes (Signed)
Inpatient Diabetes Program Recommendations  AACE/ADA: New Consensus Statement on Inpatient Glycemic Control (2015)  Target Ranges:  Prepandial:   less than 140 mg/dL      Peak postprandial:   less than 180 mg/dL (1-2 hours)      Critically ill patients:  140 - 180 mg/dL   Results for UGO, THOMA (MRN [id number]) as of 12/22/2015 14:19  Ref. Range 12/21/2015 18:50  Sodium Latest Ref Range: 135 - 145 mmol/L 133 (L)  Potassium Latest Ref Range: 3.5 - 5.1 mmol/L 4.7  Chloride Latest Ref Range: 101 - 111 mmol/L 100 (L)  CO2 Latest Ref Range: 22 - 32 mmol/L 17 (L)  BUN Latest Ref Range: 6 - 20 mg/dL 11  Creatinine Latest Ref Range: 0.61 - 1.24 mg/dL 0.93  Calcium Latest Ref Range: 8.9 - 10.3 mg/dL 7.7 (L)  EGFR (Non-African Amer.) Latest Ref Range: >60 mL/min >60  EGFR (African American) Latest Ref Range: >60 mL/min >60  Glucose Latest Ref Range: 65 - 99 mg/dL 615 (HH)  Anion gap Latest Ref Range: 5 - 15  16 (H)    Admit with: DKA (no money to buy insulin)  History: DM  Home DM Meds: 70/30 Insulin- 15 units AM/ 10 units PM       Regular Insulin 5 units bid  Current Insulin Orders: Lantus 12 units daily      Novolog Moderate Correction Scale/ SSI (0-15 units) TID AC + HS     -Note patient admitted with DKA.  Unable to buy medications.  Does not have any money.  No Insurance coverage and no PCP.  -Note Care management consulted.  Already taking the least expensive insulin at home.  Reli-on brand 70/30 insulin from [facility] is $25 per vial (Order # [id number]).  -Transitioned off IV Insulin drip this AM to Lantus and Novolog.  CBG up to 410 mg/dl at lunch.    MD- Please consider switching patient back to his home doses of 70/30 insulin so we can evaluate his CBGs and make adjustments to his home insulin regimen if needed     --Will follow patient during hospitalization--  Wyn Quaker RN, MSN, CDE Diabetes Coordinator Inpatient Glycemic Control Team Team Pager:  [phone] (8a-5p)

## 2015-12-23 DIAGNOSIS — E0910 Drug or chemical induced diabetes mellitus with ketoacidosis without coma: Secondary | ICD-10-CM

## 2015-12-23 DIAGNOSIS — E119 Type 2 diabetes mellitus without complications: Secondary | ICD-10-CM | POA: Diagnosis not present

## 2015-12-23 DIAGNOSIS — E0590 Thyrotoxicosis, unspecified without thyrotoxic crisis or storm: Secondary | ICD-10-CM | POA: Diagnosis not present

## 2015-12-23 DIAGNOSIS — E872 Acidosis: Secondary | ICD-10-CM | POA: Diagnosis not present

## 2015-12-23 LAB — BASIC METABOLIC PANEL
Anion gap: 6 (ref 5–15)
BUN: 7 mg/dL (ref 6–20)
CALCIUM: 8.1 mg/dL — AB (ref 8.9–10.3)
CO2: 28 mmol/L (ref 22–32)
Chloride: 98 mmol/L — ABNORMAL LOW (ref 101–111)
Creatinine, Ser: 0.67 mg/dL (ref 0.61–1.24)
GFR calc Af Amer: >60 mL/min (ref >60)
Glucose, Bld: 397 mg/dL — ABNORMAL HIGH (ref 65–99)
POTASSIUM: 4.2 mmol/L (ref 3.5–5.1)
Sodium: 132 mmol/L — ABNORMAL LOW (ref 135–145)

## 2015-12-23 LAB — GLUCOSE, CAPILLARY: GLUCOSE-CAPILLARY: 327 mg/dL — AB (ref 65–99)

## 2015-12-23 NOTE — Progress Notes (Signed)
Patient discharged to home IV access removed. Will take by wheelchair to car and home

## 2015-12-23 NOTE — Discharge Summary (Signed)
Physician Discharge Summary  Kenneth [name] WVP:[id number] DOB: [birth year] DOA: 12/21/2015   Admit date: 12/21/2015 Discharge date: 12/23/2015  Admitted From: Home  Disposition: Home  Recommendations for Outpatient Follow-up:  1. Follow up with PCP in 1-2 weeks  Home Health: No  Equipment/Devices: None   Discharge Condition: Stable  CODE STATUS: FULL CODE  Diet recommendation: Carb modified   Brief/Interim Summary: Patient with hx of DM, DKA and HONK, hyperthyroidism, non compliant, admitted for DKA with AG 16, bicarb 10 and BS 600 due to non compliant. He was given IV Insulin, and his gap closed to 4, normal Cr and K of 3.4. His insulin drip was stopped and he is now on sliding scale with basal insulin of 12 units. Patient will be discharged on his home insulin regimen. He will follow up with his PCP next week.   Discharge Diagnoses:  Principal Problem:   DKA (diabetic ketoacidoses) ([place]) Active Problems:   Hyperthyroidism   DM type 2, goal HbA1c 7%-8% (HCC)   Volume depletion   Metabolic acidosis  1. DKA: Overall improved. Continue current medications.  2. Hyperthyroidism: Previously on Tapazole. TSH is within normal limits.   Discharge Instructions  Discharge Instructions    Diet - low sodium heart healthy    Complete by:  As directed    Discharge instructions    Complete by:  As directed    Follow up your PCP next week.  Avoid sweet and take your insulin as directed.   Increase activity slowly    Complete by:  As directed        Medication List    STOP taking these medications   blood glucose meter kit and supplies Kit   methimazole 5 MG tablet Commonly known as:  TAPAZOLE     TAKE these medications   gabapentin 300 MG capsule Commonly known as:  NEURONTIN Take 2 capsules (600 mg total) by mouth 3 (three) times daily as needed (leg pain).   insulin aspart protamine- aspart (70-30) 100 UNIT/ML injection Commonly known as:  NOVOLOG MIX 70/30 Take 15units  [place] qam with breakfast and 10units [place] qpm with dinner What changed:  how much to take  how to take this  when to take this  additional instructions   insulin regular 100 units/mL injection Commonly known as:  NOVOLIN R,HUMULIN R Inject 5 Units into the skin 2 (two) times daily before a meal.   Insulin Syringe-Needle U-100 30G X 1/2" 0.5 ML Misc Use to inject insulin subcutaneously BID       No Known Allergies  Dg Chest Portable 1 View  Result Date: 12/21/2015 CLINICAL DATA:  Pain and vomiting for 1 day EXAM: PORTABLE CHEST 1 VIEW COMPARISON:  01/19/2015 FINDINGS: Cardiac shadow is within normal limits. The lungs are well aerated bilaterally. No focal infiltrate or sizable effusion is seen. No pneumothorax is noted. No acute bony abnormality is seen. IMPRESSION: No active disease. Electronically Signed   By: Inez Catalina M.D.   On: 12/21/2015 18:27       Subjective:   Discharge Exam: Vitals:   12/23/15 0815 12/23/15 0900  BP:  102/75  Pulse:    Resp:  (!) 21  Temp: (!) 96.8 F (36 C)    Vitals:   12/23/15 0700 12/23/15 0800 12/23/15 0815 12/23/15 0900  BP: 101/72 110/76  102/75  Pulse:      Resp: 12 (!) 28  (!) 21  Temp:   (!) 96.8 F (36 C)  TempSrc:   Oral   SpO2:      Weight:      Height:        General: Pt is alert, awake, not in acute distress Cardiovascular: RRR, S1/S2 +, no rubs, no gallops Respiratory: CTA bilaterally, no wheezing, no rhonchi Abdominal: Soft, NT, ND, bowel sounds + Extremities: no edema, no cyanosis    The results of significant diagnostics from this hospitalization (including imaging, microbiology, ancillary and laboratory) are listed below for reference.     Microbiology: Recent Results (from the past 240 hour(s))  MRSA PCR Screening     Status: None   Collection Time: 12/21/15  9:39 PM  Result Value Ref Range Status   MRSA by PCR NEGATIVE NEGATIVE Final    Comment:        The GeneXpert MRSA Assay (FDA approved for  NASAL specimens only), is one component of a comprehensive MRSA colonization surveillance program. It is not intended to diagnose MRSA infection nor to guide or monitor treatment for MRSA infections.      Basic Metabolic Panel:  Recent Labs Lab 12/22/15 0130 12/22/15 0442 12/22/15 0918 12/22/15 2039 12/23/15 0845  NA 135 138 134* 134* 132*  K 3.5 3.4* 3.6 3.9 4.2  CL 107 110 103 102 98*  CO2 _0 GLUCOSE 242* 112* 217* 186* 397*  BUN _1 CREATININE 0.63 0.58* 0.58* 0.64 0.67  CALCIUM 7.3* 7.7* 8.0* 8.1* 8.1*   Liver Function Tests:  Recent Labs Lab 12/21/15 1850  AST 25  ALT 20  ALKPHOS 83  BILITOT 1.4*  PROT 5.9*  ALBUMIN 3.2*    Recent Labs Lab 12/21/15 1850  LIPASE 17    Recent Labs Lab 12/21/15 1850  WBC 4.7  NEUTROABS 3.3  HGB 13.5  HCT 39.8  MCV 95.7  PLT 146*   Cardiac Enzymes:  Recent Labs Lab 12/21/15 1850  CKTOTAL 134   BNP: Invalid input(s): POCBNP CBG:  Recent Labs Lab 12/22/15 0720 12/22/15 1158 12/22/15 1703 12/22/15 2127 12/23/15 0810  GLUCAP 86 410* 198* 229* 327*   Thyroid function studies  Recent Labs  12/22/15 0918  TSH 1.373   Urinalysis    Component Value Date/Time   COLORURINE YELLOW 12/21/2015 1811   APPEARANCEUR CLEAR 12/21/2015 1811   LABSPEC <1.005 (L) 12/21/2015 1811   PHURINE 5.5 12/21/2015 1811   GLUCOSEU >1000 (A) 12/21/2015 1811   HGBUR NEGATIVE 12/21/2015 1811   BILIRUBINUR NEGATIVE 12/21/2015 1811   KETONESUR >80 (A) 12/21/2015 1811   PROTEINUR NEGATIVE 12/21/2015 1811   UROBILINOGEN 0.2 01/18/2015 2300   NITRITE NEGATIVE 12/21/2015 1811   LEUKOCYTESUR NEGATIVE 12/21/2015 1811   Microbiology Recent Results (from the past 240 hour(s))  MRSA PCR Screening     Status: None   Collection Time: 12/21/15  9:39 PM  Result Value Ref Range Status   MRSA by PCR NEGATIVE NEGATIVE Final    Comment:        The GeneXpert MRSA Assay (FDA approved for NASAL specimens only),  is one component of a comprehensive MRSA colonization surveillance program. It is not intended to diagnose MRSA infection nor to guide or monitor treatment for MRSA infections.      Time coordinating discharge: Over 30 minutes  SIGNED:   Orvan Falconer, MD FACP [facility] 12/23/2015, 10:24 AM   If 7PM-7AM, please contact night-coverage [email or web address] Password [email or web address] No Known Allergies   By signing my name below, I, Collene Leyden, attest that this  documentation has been prepared under the direction and in the presence of Orvan Falconer, MD. Electronically signed: Collene Leyden, Scribe10/03/17 8:29 AM

## 2016-02-23 ENCOUNTER — Inpatient Hospital Stay (HOSPITAL_COMMUNITY)
Admission: EM | Admit: 2016-02-23 | Discharge: 2016-03-03 | DRG: 638 | Disposition: A | Discharge status: Home / Self Care | Payer: Medicaid Other | Attending: Internal Medicine | Admitting: Internal Medicine

## 2016-02-23 ENCOUNTER — Emergency Department (HOSPITAL_COMMUNITY): Payer: Medicaid Other

## 2016-02-23 ENCOUNTER — Encounter (HOSPITAL_COMMUNITY): Payer: Self-pay | Admitting: *Deleted

## 2016-02-23 DIAGNOSIS — E1100 Type 2 diabetes mellitus with hyperosmolarity without nonketotic hyperglycemic-hyperosmolar coma (NKHHC): Secondary | ICD-10-CM | POA: Diagnosis present

## 2016-02-23 DIAGNOSIS — R739 Hyperglycemia, unspecified: Secondary | ICD-10-CM

## 2016-02-23 DIAGNOSIS — R109 Unspecified abdominal pain: Secondary | ICD-10-CM

## 2016-02-23 DIAGNOSIS — E1140 Type 2 diabetes mellitus with diabetic neuropathy, unspecified: Secondary | ICD-10-CM | POA: Diagnosis present

## 2016-02-23 DIAGNOSIS — K5900 Constipation, unspecified: Secondary | ICD-10-CM | POA: Diagnosis not present

## 2016-02-23 DIAGNOSIS — E875 Hyperkalemia: Secondary | ICD-10-CM | POA: Diagnosis not present

## 2016-02-23 DIAGNOSIS — E871 Hypo-osmolality and hyponatremia: Secondary | ICD-10-CM | POA: Diagnosis present

## 2016-02-23 DIAGNOSIS — E0590 Thyrotoxicosis, unspecified without thyrotoxic crisis or storm: Secondary | ICD-10-CM | POA: Diagnosis present

## 2016-02-23 DIAGNOSIS — R1031 Right lower quadrant pain: Secondary | ICD-10-CM | POA: Diagnosis present

## 2016-02-23 DIAGNOSIS — I700 Atherosclerosis of aorta: Secondary | ICD-10-CM | POA: Diagnosis present

## 2016-02-23 DIAGNOSIS — Z599 Problem related to housing and economic circumstances, unspecified: Secondary | ICD-10-CM

## 2016-02-23 DIAGNOSIS — F17210 Nicotine dependence, cigarettes, uncomplicated: Secondary | ICD-10-CM | POA: Diagnosis present

## 2016-02-23 DIAGNOSIS — K824 Cholesterolosis of gallbladder: Secondary | ICD-10-CM | POA: Diagnosis present

## 2016-02-23 DIAGNOSIS — I959 Hypotension, unspecified: Secondary | ICD-10-CM | POA: Diagnosis not present

## 2016-02-23 DIAGNOSIS — G8929 Other chronic pain: Secondary | ICD-10-CM | POA: Diagnosis present

## 2016-02-23 DIAGNOSIS — R45851 Suicidal ideations: Secondary | ICD-10-CM

## 2016-02-23 DIAGNOSIS — E860 Dehydration: Secondary | ICD-10-CM

## 2016-02-23 DIAGNOSIS — R10811 Right upper quadrant abdominal tenderness: Secondary | ICD-10-CM

## 2016-02-23 DIAGNOSIS — F4321 Adjustment disorder with depressed mood: Secondary | ICD-10-CM | POA: Diagnosis present

## 2016-02-23 DIAGNOSIS — E119 Type 2 diabetes mellitus without complications: Secondary | ICD-10-CM

## 2016-02-23 DIAGNOSIS — Z9114 Patient's other noncompliance with medication regimen: Secondary | ICD-10-CM

## 2016-02-23 DIAGNOSIS — R1013 Epigastric pain: Secondary | ICD-10-CM

## 2016-02-23 DIAGNOSIS — E1110 Type 2 diabetes mellitus with ketoacidosis without coma: Secondary | ICD-10-CM

## 2016-02-23 DIAGNOSIS — Z794 Long term (current) use of insulin: Secondary | ICD-10-CM

## 2016-02-23 DIAGNOSIS — E0910 Drug or chemical induced diabetes mellitus with ketoacidosis without coma: Secondary | ICD-10-CM

## 2016-02-23 DIAGNOSIS — F339 Major depressive disorder, recurrent, unspecified: Secondary | ICD-10-CM | POA: Diagnosis present

## 2016-02-23 LAB — CBC
HEMATOCRIT: 42 % (ref 39.0–52.0)
HEMOGLOBIN: 14.9 g/dL (ref 13.0–17.0)
MCH: 32.4 pg (ref 26.0–34.0)
MCHC: 35.5 g/dL (ref 30.0–36.0)
MCV: 91.3 fL (ref 78.0–100.0)
Platelets: 199 10*3/uL (ref 150–400)
RBC: 4.6 MIL/uL (ref 4.22–5.81)
RDW: 12.2 % (ref 11.5–15.5)
WBC: 9.7 10*3/uL (ref 4.0–10.5)

## 2016-02-23 LAB — CBG MONITORING, ED: Glucose-Capillary: 584 mg/dL (ref 65–99)

## 2016-02-23 LAB — GLUCOSE, CAPILLARY
GLUCOSE-CAPILLARY: 558 mg/dL — AB (ref 65–99)
Glucose-Capillary: 443 mg/dL — ABNORMAL HIGH (ref 65–99)
Glucose-Capillary: 596 mg/dL (ref 65–99)

## 2016-02-23 LAB — HEPATIC FUNCTION PANEL
ALT: 17 U/L (ref 17–63)
AST: 11 U/L — AB (ref 15–41)
Albumin: 3.7 g/dL (ref 3.5–5.0)
Alkaline Phosphatase: 76 U/L (ref 38–126)
BILIRUBIN DIRECT: 0.1 mg/dL (ref 0.1–0.5)
BILIRUBIN INDIRECT: 1 mg/dL — AB (ref 0.3–0.9)
BILIRUBIN TOTAL: 1.1 mg/dL (ref 0.3–1.2)
Total Protein: 6.2 g/dL — ABNORMAL LOW (ref 6.5–8.1)

## 2016-02-23 LAB — BLOOD GAS, VENOUS
Acid-Base Excess: 3.1 mmol/L — ABNORMAL HIGH (ref 0.0–2.0)
Bicarbonate: 28.7 mmol/L — ABNORMAL HIGH (ref 20.0–28.0)
DRAWN BY: 295031
FIO2: 21
O2 SAT: 69.5 %
PCO2 VEN: 49.9 mmHg (ref 44.0–60.0)
PH VEN: 7.378 (ref 7.250–7.430)
Patient temperature: 98.6
pO2, Ven: 35.6 mmHg (ref 32.0–45.0)

## 2016-02-23 LAB — BASIC METABOLIC PANEL
ANION GAP: 13 (ref 5–15)
BUN: 11 mg/dL (ref 6–20)
CHLORIDE: 84 mmol/L — AB (ref 101–111)
CO2: 28 mmol/L (ref 22–32)
Calcium: 9 mg/dL (ref 8.9–10.3)
Creatinine, Ser: 0.98 mg/dL (ref 0.61–1.24)
GFR calc Af Amer: >60 mL/min (ref >60)
GFR calc non Af Amer: >60 mL/min (ref >60)
Glucose, Bld: 777 mg/dL (ref 65–99)
Potassium: 5 mmol/L (ref 3.5–5.1)
Sodium: 125 mmol/L — ABNORMAL LOW (ref 135–145)

## 2016-02-23 LAB — URINE MICROSCOPIC-ADD ON: RBC / HPF: NONE SEEN RBC/hpf (ref 0–5)

## 2016-02-23 LAB — URINALYSIS, ROUTINE W REFLEX MICROSCOPIC
Bilirubin Urine: NEGATIVE
HGB URINE DIPSTICK: NEGATIVE
Ketones, ur: 15 mg/dL — AB
Leukocytes, UA: NEGATIVE
Nitrite: NEGATIVE
PH: 6 (ref 5.0–8.0)
Protein, ur: NEGATIVE mg/dL
SPECIFIC GRAVITY, URINE: 1.039 — AB (ref 1.005–1.030)

## 2016-02-23 LAB — GLUCOSE, RANDOM: GLUCOSE: 563 mg/dL — AB (ref 65–99)

## 2016-02-23 LAB — LIPASE, BLOOD: LIPASE: 21 U/L (ref 11–51)

## 2016-02-23 MED ORDER — FOLIC ACID 1 MG PO TABS
1.0000 mg | ORAL_TABLET | Freq: Every day | ORAL | Status: DC
  Administered 2016-02-23 – 2016-03-03 (×9): 1 mg via ORAL
  Filled 2016-02-23 (×10): qty 1

## 2016-02-23 MED ORDER — LORAZEPAM 2 MG/ML IJ SOLN: 1.0000 mg | Freq: Four times a day (QID) | INTRAMUSCULAR | Status: DC | PRN

## 2016-02-23 MED ORDER — ACETAMINOPHEN 650 MG RE SUPP: 650.0000 mg | Freq: Four times a day (QID) | RECTAL | Status: DC | PRN

## 2016-02-23 MED ORDER — ONDANSETRON HCL 4 MG PO TABS: 4.0000 mg | ORAL_TABLET | Freq: Four times a day (QID) | ORAL | Status: DC | PRN

## 2016-02-23 MED ORDER — MORPHINE SULFATE (PF) 2 MG/ML IV SOLN
2.0000 mg | Freq: Once | INTRAVENOUS | Status: AC
  Administered 2016-02-23: 2 mg via INTRAVENOUS
  Filled 2016-02-23: qty 1

## 2016-02-23 MED ORDER — SODIUM CHLORIDE 0.9 % IV BOLUS (SEPSIS)
1000.0000 mL | Freq: Once | INTRAVENOUS | Status: AC
  Administered 2016-02-23: 1000 mL via INTRAVENOUS

## 2016-02-23 MED ORDER — INSULIN ASPART PROT & ASPART (70-30 MIX) 100 UNIT/ML ~~LOC~~ SUSP
10.0000 [IU] | Freq: Two times a day (BID) | SUBCUTANEOUS | Status: DC
  Administered 2016-02-24: 10 [IU] via SUBCUTANEOUS

## 2016-02-23 MED ORDER — VITAMIN B-1 100 MG PO TABS
100.0000 mg | ORAL_TABLET | Freq: Every day | ORAL | Status: DC
  Administered 2016-02-23 – 2016-03-03 (×9): 100 mg via ORAL
  Filled 2016-02-23 (×10): qty 1

## 2016-02-23 MED ORDER — INSULIN ASPART 100 UNIT/ML ~~LOC~~ SOLN: 0.0000 [IU] | Freq: Every day | SUBCUTANEOUS | Status: DC

## 2016-02-23 MED ORDER — INSULIN ASPART 100 UNIT/ML ~~LOC~~ SOLN: 0.0000 [IU] | Freq: Three times a day (TID) | SUBCUTANEOUS | Status: DC

## 2016-02-23 MED ORDER — SODIUM CHLORIDE 0.9 % IV SOLN
INTRAVENOUS | Status: DC
  Administered 2016-02-23: 23:00:00 via INTRAVENOUS
  Administered 2016-02-23: 1000 mL via INTRAVENOUS
  Administered 2016-02-24: 18:00:00 via INTRAVENOUS
  Administered 2016-02-24: 1000 mL via INTRAVENOUS
  Administered 2016-02-25 (×2): via INTRAVENOUS

## 2016-02-23 MED ORDER — INSULIN ASPART PROT & ASPART (70-30 MIX) 100 UNIT/ML ~~LOC~~ SUSP
5.0000 [IU] | Freq: Once | SUBCUTANEOUS | Status: AC
  Administered 2016-02-23: 5 [IU] via SUBCUTANEOUS

## 2016-02-23 MED ORDER — INSULIN ASPART 100 UNIT/ML ~~LOC~~ SOLN
0.0000 [IU] | SUBCUTANEOUS | Status: DC
  Administered 2016-02-23: 15 [IU] via SUBCUTANEOUS
  Administered 2016-02-24: 2 [IU] via SUBCUTANEOUS
  Administered 2016-02-24 (×2): 8 [IU] via SUBCUTANEOUS

## 2016-02-23 MED ORDER — ONDANSETRON HCL 4 MG/2ML IJ SOLN: 4.0000 mg | Freq: Four times a day (QID) | INTRAMUSCULAR | Status: DC | PRN

## 2016-02-23 MED ORDER — ENOXAPARIN SODIUM 40 MG/0.4ML ~~LOC~~ SOLN
40.0000 mg | SUBCUTANEOUS | Status: DC
  Administered 2016-02-23 – 2016-03-02 (×9): 40 mg via SUBCUTANEOUS
  Filled 2016-02-23 (×9): qty 0.4

## 2016-02-23 MED ORDER — LORAZEPAM 1 MG PO TABS: 1.0000 mg | ORAL_TABLET | Freq: Four times a day (QID) | ORAL | Status: DC | PRN

## 2016-02-23 MED ORDER — GI COCKTAIL ~~LOC~~
30.0000 mL | Freq: Once | ORAL | Status: AC
  Administered 2016-02-23: 30 mL via ORAL
  Filled 2016-02-23: qty 30

## 2016-02-23 MED ORDER — THIAMINE HCL 100 MG/ML IJ SOLN: 100.0000 mg | Freq: Every day | INTRAMUSCULAR | Status: DC

## 2016-02-23 MED ORDER — INSULIN ASPART PROT & ASPART (70-30 MIX) 100 UNIT/ML ~~LOC~~ SUSP
5.0000 [IU] | Freq: Two times a day (BID) | SUBCUTANEOUS | Status: DC
  Administered 2016-02-23: 5 [IU] via SUBCUTANEOUS
  Filled 2016-02-23: qty 10

## 2016-02-23 MED ORDER — ACETAMINOPHEN 325 MG PO TABS
650.0000 mg | ORAL_TABLET | Freq: Four times a day (QID) | ORAL | Status: DC | PRN
  Filled 2016-02-23: qty 2

## 2016-02-23 MED ORDER — SODIUM CHLORIDE 0.9 % IV SOLN: INTRAVENOUS | Status: AC

## 2016-02-23 MED ORDER — INSULIN ASPART 100 UNIT/ML ~~LOC~~ SOLN
5.0000 [IU] | Freq: Once | SUBCUTANEOUS | Status: AC
  Administered 2016-02-23: 5 [IU] via INTRAVENOUS
  Filled 2016-02-23: qty 1

## 2016-02-23 MED ORDER — ADULT MULTIVITAMIN W/MINERALS CH
1.0000 | ORAL_TABLET | Freq: Every day | ORAL | Status: DC
  Administered 2016-02-23 – 2016-03-03 (×9): 1 via ORAL
  Filled 2016-02-23 (×10): qty 1

## 2016-02-23 NOTE — Progress Notes (Signed)
CBG at 1700 was 596. Stat lab verification done, it was 563. Dr Maudry MayhewNette called  And informed. He ordered 15 units of Novolog and the 5 units of 70/30. CBG's to be done q 4 hr. Pt's appetite is good. Sitter in room for suicide precautions, he had stated to his sister Karoline Caldwellngie that he was done and he planned to end his life with insulin over dose. States he has been using his friends insulin because he can't afford to get his own medications. Pt and his sister also stated he can't get food stamps because he has a felony conviction( for drugs).

## 2016-02-23 NOTE — ED Triage Notes (Signed)
Pt presents to ED with c/o uncontrolled hyperglycemia per pt's sister he also suffers from seizures. Sister sts pt is not able to afford most of his medications, has no PCP, he walked to free clinic (no transportation) and was told they can't see him (per sister). Due to increased stress sister sts, pt has developed suicidal ideations

## 2016-02-23 NOTE — H&P (Signed)
History and Physical    Kenneth [name] ZOX:[id number] DOB: 14-Feb-1966 DOA: 02/23/2016  PCP: No PCP Per Patient Patient coming from: Home  Chief Complaint: Suicidal ideation  HPI: Kenneth [name] is a 50 y.o. male with medical history significant of diabetes mellitus type 2, ?seizures, ?thyroid disease. Patient's sister reports she brought the patient to the hospital because of suicidal ideation. He has been feeling this way for weeks. He reports not seeking treatment for his feelings. His plan includes not taking insulin and going into severe hyperglycemia. He reports having nausea with non-bloody, non-bilious vomiting for the past three days, which has worsened. He reports taking insulin when he can whenever someone is willing to give him some, as he does not have access to insulin hisself. He is unsure of what insulin he takes. Stress of his life situation has made his symptoms aggravated.  ED Course: Vitals: Afebrile, normotensive. On room air. Labs: venous pH 7.378. Sodium 125. Glucose 777. Potassium 5.0. CO2 28. Anion gap 13.  Imaging: CXR significant for no active disease Medications/Course: 2L NS bolus. 5u aspart  Review of Systems: Review of Systems  Constitutional: Negative for chills and fever.  Respiratory: Negative for cough, shortness of breath and wheezing.   Cardiovascular: Negative for chest pain and palpitations.  Gastrointestinal: Positive for abdominal pain, nausea and vomiting. Negative for blood in stool, constipation and diarrhea.  All other systems reviewed and are negative.   Past Medical History:  Diagnosis Date  . Arthritis   . Diabetes mellitus   . DKA (diabetic ketoacidosis) (HCC) 07/2014  . Malnutrition (HCC)   . Noncompliance with medication regimen   . Thyroid disease     History reviewed. No pertinent surgical history.   reports that he has been smoking.  He has a 4.00 pack-year smoking history. He has never used smokeless tobacco. He reports that he  does not drink alcohol or use drugs.  No Known Allergies  Family History  Problem Relation Age of Onset  . Diabetes Mother   . Seizures Sister   . Diabetes Sister     Prior to Admission medications   Medication Sig Start Date End Date Taking? Authorizing Provider  insulin aspart protamine- aspart (NOVOLOG MIX 70/30) (70-30) 100 UNIT/ML injection Inject into the skin 3 (three) times daily with meals as needed.    Yes Historical Provider, MD  insulin NPH Human (HUMULIN N,NOVOLIN N) 100 UNIT/ML injection Inject into the skin 2 (two) times daily.   Yes Historical Provider, MD    Physical Exam: Vitals:   02/23/16 0902 02/23/16 1123  BP: 124/76 (!) 123/108  Pulse: 72 67  Resp: 16 14  Temp: 97.9 F (36.6 C)   TempSrc: Oral   SpO2: 99% 100%     Constitutional: NAD, calm, comfortable. Looks chronically ill Eyes: PERRL, lids and conjunctivae normal ENMT: Mucous membranes are dry. Posterior pharynx clear of any exudate or lesions. poor dentition.  Neck: normal, supple, no masses, no thyromegaly Respiratory: clear to auscultation bilaterally, no wheezing, no crackles. Normal respiratory effort. No accessory muscle use.  Cardiovascular: Regular rate and rhythm, no murmurs / rubs / gallops. No extremity edema. 2+ pedal pulses.  Abdomen: mild epigastric tenderness, no masses palpated. Murphy sign negative. Bowel sounds positive.  Musculoskeletal: no clubbing / cyanosis. No joint deformity upper and lower extremities. Good ROM, no contractures. Normal muscle tone.  Skin: no rashes, lesions, ulcers. No induration Neurologic: CN 2-12 grossly intact. Sensation intact, DTR normal. Strength 5/5 in all  4.  Psychiatric:  Alert and oriented x 3. Depressed mood. Flat affect. Suicidal ideation that is passive, however, he has shared a plan.   Labs on Admission: I have personally reviewed following labs and imaging studies  CBC:  Recent Labs Lab 02/23/16 0930  WBC 9.7  HGB 14.9  HCT 42.0    MCV 91.3  PLT 199   Basic Metabolic Panel:  Recent Labs Lab 02/23/16 0930  NA 125*  K 5.0  CL 84*  CO2 28  GLUCOSE 777*  BUN 11  CREATININE 0.98  CALCIUM 9.0   GFR: CrCl cannot be calculated (Unknown ideal weight.). Liver Function Tests: No results for input(s): AST, ALT, ALKPHOS, BILITOT, PROT, ALBUMIN in the last 168 hours. No results for input(s): LIPASE, AMYLASE in the last 168 hours. No results for input(s): AMMONIA in the last 168 hours. Coagulation Profile: No results for input(s): INR, PROTIME in the last 168 hours. Cardiac Enzymes: No results for input(s): CKTOTAL, CKMB, CKMBINDEX, TROPONINI in the last 168 hours. BNP (last 3 results) No results for input(s): PROBNP in the last 8760 hours. HbA1C: No results for input(s): HGBA1C in the last 72 hours. CBG:  Recent Labs Lab 02/23/16 0925 02/23/16 1115  GLUCAP >600* 584*   Lipid Profile: No results for input(s): CHOL, HDL, LDLCALC, TRIG, CHOLHDL, LDLDIRECT in the last 72 hours. Thyroid Function Tests: No results for input(s): TSH, T4TOTAL, FREET4, T3FREE, THYROIDAB in the last 72 hours. Anemia Panel: No results for input(s): VITAMINB12, FOLATE, FERRITIN, TIBC, IRON, RETICCTPCT in the last 72 hours. Urine analysis:    Component Value Date/Time   COLORURINE YELLOW 02/23/2016 0856   APPEARANCEUR CLEAR 02/23/2016 0856   LABSPEC 1.039 (H) 02/23/2016 0856   PHURINE 6.0 02/23/2016 0856   GLUCOSEU >1000 (A) 02/23/2016 0856   HGBUR NEGATIVE 02/23/2016 0856   BILIRUBINUR NEGATIVE 02/23/2016 0856   KETONESUR 15 (A) 02/23/2016 0856   PROTEINUR NEGATIVE 02/23/2016 0856   UROBILINOGEN 0.2 01/18/2015 2300   NITRITE NEGATIVE 02/23/2016 0856   LEUKOCYTESUR NEGATIVE 02/23/2016 0856   Sepsis Labs: !!!!!!!!!!!!!!!!!!!!!!!!!!!!!!!!!!!!!!!!!!!! @LABRCNTIP (procalcitonin:4,lacticidven:4) )No results found for this or any previous visit (from the past 240 hour(s)).   Radiological Exams on Admission: Dg Chest 2  View  Result Date: 02/23/2016 CLINICAL DATA:  Cough, chest congestion, chest pain, and shortness of breath began this morning. Current smoker. History of seizures and diabetes EXAM: CHEST  2 VIEW COMPARISON:  Portable chest x-ray of December 21, 2015 FINDINGS: The lungs remain mildly hyperinflated. There is no focal infiltrate. There is no pleural effusion or pneumothorax. The heart and pulmonary vascularity are normal. The mediastinum is normal in width. The observed bony thorax exhibits no acute abnormality. Mild hyperinflation consistent with the patient's smoking history. No pneumonia, CHF, nor other acute cardiopulmonary abnormality. IMPRESSION: No active cardiopulmonary disease. Electronically Signed   By: David  SwazilandJordan M.D.   On: 02/23/2016 11:11    EKG: Independently reviewed.  Assessment/Plan Active Problems:   Hyperglycemia   Suicidal ideation  Hyperglycemia Diabetes mellitus, type II Last A1C of 11.6 in 2016. Patient has had a recent admission for DKA. He has no access to medications apart from getting what he can from friends. He is dehydrated currently on exam. Stable kidney function. No AMS. -IV fluids -hemoglobin A1C -start 70/30 10u BID -diabetes coordinator -SSI  Suicidal ideation Patient severely depressed from his situation. Feels he is a burden and has to rely on others too much. He shared a plan of not using insulin to hurt himself. -  psychiatry consulted in ED. Follow recommendations -safety sitter  Hyponatremia Secondary to glucose. Corrected to 136.   Dehydration -IVF as above  Epigastric/RUQ pain Hepatic function unremarkable. Negative murphy sign -GI cocktail -RUQ ultrasound to assess liver and gallbladder -lipase  ?Seizures Patient states he has a history. He has never been treated. -seizure precautions  ?thyroid disease Last TSH was 1.373 on 12/22/2015. States he was previously on methimazole    DVT prophylaxis: Lovenox Code Status: Full  code Family Communication: Sister at bedside Disposition Plan: Anticipate discharge in one day after blood sugar better controlled and pending psych recommendations Consults called: Psychiatry Admission status: Observation, medical floor   Jacquelin Hawkingalph Vietta Bonifield, MD [facility] Pager [phone]  If 7PM-7AM, please contact night-coverage [email or web address] Password [email or web address]  02/23/2016, 12:48 PM

## 2016-02-23 NOTE — Progress Notes (Signed)
[facility] ED CM noted CM consult for Patient can't afford meds or a pcp CM spoke with pt and his sister at bedside after pt seen by Hospitalist Sister states that [facility] [place].  Pt reports living in the disability apartment complex across from the free clinic.  Sister states pt has signed up for medicaid "almost a month ago" and she has spoken with various staff members without success more than once.  Sister has attempted to help pt get other [place] uninsured resources but unsuccessful.  Pt and sister reports to CM various calls and visits, "Lady up there just don't like him and said he will never meet to get help that's why I brought him here" and "he does not like going to [facility] cause they do not help him"  Sister reports pt with "depression. I have never seen him like this"  Pt reports "I am diabetic with neuropathy, can bearly see and can not walk from here to that door. I don't know why I ain't able to get medicaid" Various complaints made about [place] [place] [place] and other services there.   Cm offered [place] uninsured provider list but sister states the pt has no money or way of getting [place] providers Cm discussed pt with recent use of [facility] program for medications on 12/22/15 ([facility] and [facility]IC CM notes in October 2017) .  Pt confirms letter used to get his insulin CM discussed this is a one time program and CM would not be able to offer it again. Pt and sister voiced understanding  Cm offered a list of free clinics in [place] and surrounding areas to include [facility], [facility] [place] [phone], [facility] [place] [phone], [facility] [place], [facility] [place], [facility] [place],  [facility] [place], [facility] [place] [facility] [place], [facility] [place], [facility] [place] and [place] [facility] [place]

## 2016-02-23 NOTE — ED Notes (Signed)
1st attempt to call report unsuccessful 

## 2016-02-23 NOTE — ED Provider Notes (Addendum)
[facility] Provider Note   CSN: [id number] Arrival date & time: 02/23/16  [id number]     History   Chief Complaint Chief Complaint  Patient presents with  . Hyperglycemia  . Suicidal    HPI Kenneth [name] is a 50 y.o. male.  HPI  50 year old male with history of poorly controlled diabetes here with worsening hyperglycemia, nausea, and failure to thrive. Patient has history of recent admission in October for DKA. Patient states over the last month, he has been unable to afford any of his medications. He has been intermittently borrowing insulin from a friend and took some this morning, but admits to regular use. He has also not been checking his blood sugars. Over the last month, he endorses weight loss, fatigue, polyuria and polydipsia. He has chronic back and side pain but denies any acute changes in his pain location or character. Of note, he also states that because of his chronic illness and inability to obtain disability, he has had increasing depression as well as transient thoughts of suicidal ideation. Denies any overt suicidal plan. He has not attempted any suicide. Denies any recent drug use. Denies any regular alcohol use.  Past Medical History:  Diagnosis Date  . Arthritis   . Diabetes mellitus   . DKA (diabetic ketoacidosis) (HCC) 07/2014  . Malnutrition (HCC)   . Noncompliance with medication regimen   . Thyroid disease     Patient Active Problem List   Diagnosis Date Noted  . Hyperglycemia 02/23/2016  . Volume depletion 12/21/2015  . Metabolic acidosis 12/21/2015  . Diabetes mellitus with nonketotic hyperosmolarity (HCC) 09/21/2014  . DM type 2, goal HbA1c 7%-8% (HCC) 09/21/2014  . Noncompliance with medication regimen   . DM (diabetes mellitus) (HCC)   . DKA (diabetic ketoacidoses) (HCC) 07/24/2014  . Dehydration 07/24/2014  . Acute renal failure (HCC) 07/24/2014  . Hyperkalemia 07/24/2014  . Hyponatremia 07/24/2014  . Hyperthyroidism 07/24/2014     History reviewed. No pertinent surgical history.     Home Medications    Prior to Admission medications   Medication Sig Start Date End Date Taking? Authorizing Provider  insulin aspart protamine- aspart (NOVOLOG MIX 70/30) (70-30) 100 UNIT/ML injection Inject into the skin 3 (three) times daily with meals as needed.    Yes Historical Provider, MD  insulin NPH Human (HUMULIN N,NOVOLIN N) 100 UNIT/ML injection Inject into the skin 2 (two) times daily.   Yes Historical Provider, MD    Family History History reviewed. No pertinent family history.  Social History Social History  Substance Use Topics  . Smoking status: Current Every Day Smoker    Packs/day: 0.50    Years: 8.00  . Smokeless tobacco: Never Used  . Alcohol use No     Allergies   Patient has no known allergies.   Review of Systems Review of Systems  Constitutional: Positive for fatigue and unexpected weight change. Negative for chills and fever.  HENT: Negative for congestion and rhinorrhea.   Eyes: Negative for visual disturbance.  Respiratory: Negative for cough, shortness of breath and wheezing.   Cardiovascular: Negative for chest pain and leg swelling.  Gastrointestinal: Negative for abdominal pain, diarrhea, nausea and vomiting.  Endocrine: Positive for polydipsia, polyphagia and polyuria.  Genitourinary: Negative for dysuria and flank pain.  Musculoskeletal: Negative for neck pain and neck stiffness.  Skin: Negative for rash and wound.  Allergic/Immunologic: Negative for immunocompromised state.  Neurological: Negative for syncope, weakness and headaches.  Psychiatric/Behavioral: Positive for dysphoric mood and  suicidal ideas.  All other systems reviewed and are negative.    Physical Exam Updated Vital Signs BP (!) 123/108   Pulse 67   Temp 97.9 F (36.6 C) (Oral)   Resp 14   SpO2 100%   Physical Exam  Constitutional: He is oriented to person, place, and time. He appears well-developed.   Cachectic, chronically ill-appearing, appears older than stated age  HENT:  Head: Normocephalic.  Mouth/Throat: No oropharyngeal exudate.  Markedly dry mucous membranes  Eyes: Conjunctivae are normal.  Neck: Neck supple.  Cardiovascular: Normal rate, regular rhythm and normal heart sounds.  Exam reveals no friction rub.   No murmur heard. Pulmonary/Chest: Effort normal and breath sounds normal. No respiratory distress. He has no wheezes. He has no rales.  Abdominal: He exhibits no distension.  Musculoskeletal: He exhibits no edema.  Neurological: He is alert and oriented to person, place, and time. He exhibits normal muscle tone.  Skin: Skin is warm. Capillary refill takes 2 to 3 seconds.  Psychiatric: He has a normal mood and affect.  Nursing note and vitals reviewed.    ED Treatments / Results  Labs (all labs ordered are listed, but only abnormal results are displayed) Labs Reviewed  BASIC METABOLIC PANEL - Abnormal; Notable for the following:       Result Value   Sodium 125 (*)    Chloride 84 (*)    Glucose, Bld 777 (*)    All other components within normal limits  URINALYSIS, ROUTINE W REFLEX MICROSCOPIC (NOT AT [facility]) - Abnormal; Notable for the following:    Specific Gravity, Urine 1.039 (*)    Glucose, UA >1000 (*)    Ketones, ur 15 (*)    All other components within normal limits  BLOOD GAS, VENOUS - Abnormal; Notable for the following:    Bicarbonate 28.7 (*)    Acid-Base Excess 3.1 (*)    All other components within normal limits  URINE MICROSCOPIC-ADD ON - Abnormal; Notable for the following:    Squamous Epithelial / LPF 0-5 (*)    Bacteria, UA RARE (*)    All other components within normal limits  CBG MONITORING, ED - Abnormal; Notable for the following:    Glucose-Capillary >600 (*)    All other components within normal limits  CBG MONITORING, ED - Abnormal; Notable for the following:    Glucose-Capillary 584 (*)    All other components within normal  limits  CBC    EKG  EKG Interpretation  Date/Time:  Monday February 23 2016 10:03:00 EST Ventricular Rate:  72 PR Interval:    QRS Duration: 104 QT Interval:  393 QTC Calculation: 431 R Axis:   118 Text Interpretation:  Sinus rhythm Probable lateral infarct, age indeterminate Probable anteroseptal infarct, old Baseline wander in lead(s) V2 Since last tracing, ST elevations and TW elevations have improved Confirmed by Erma HeritageISAACS MD, Sheria LangAMERON [phone]([id number]) on 02/23/2016 11:14:28 AM       Radiology Dg Chest 2 View  Result Date: 02/23/2016 CLINICAL DATA:  Cough, chest congestion, chest pain, and shortness of breath began this morning. Current smoker. History of seizures and diabetes EXAM: CHEST  2 VIEW COMPARISON:  Portable chest x-ray of December 21, 2015 FINDINGS: The lungs remain mildly hyperinflated. There is no focal infiltrate. There is no pleural effusion or pneumothorax. The heart and pulmonary vascularity are normal. The mediastinum is normal in width. The observed bony thorax exhibits no acute abnormality. Mild hyperinflation consistent with the patient's smoking history. No pneumonia, CHF,  nor other acute cardiopulmonary abnormality. IMPRESSION: No active cardiopulmonary disease. Electronically Signed   By: David  [place] M.D.   On: 02/23/2016 11:11    Procedures Procedures (including critical care time)  Medications Ordered in ED Medications  sodium chloride 0.9 % bolus 1,000 mL (0 mLs Intravenous Stopped 02/23/16 1109)  sodium chloride 0.9 % bolus 1,000 mL (1,000 mLs Intravenous New Bag/Given 02/23/16 1003)  insulin aspart (novoLOG) injection 5 Units (5 Units Intravenous Given 02/23/16 1127)     Initial Impression / Assessment and Plan / ED Course  I have reviewed the triage vital signs and the nursing notes.  Pertinent labs & imaging results that were available during my care of the patient were reviewed by me and considered in my medical decision making (see chart for  details).  Clinical Course     50 year old male with past medical history as above here with generalized weakness, polyuria, he polyphagia, as well as depression with suicidal ideation in setting of medical nonadherence. On arrival, vital signs are stable. On exam, he is markedly dehydrated. Initial lab work shows severe hyperglycemia with glucose of 777. Anion gap is 13 and he has 15 ketones in his urine. My primary suspicion is this is secondary to dehydration and his bicarbonate is normal as well as normal pH, making DKA less likely. However, given his lack of access to insulin and significant dehydration and hyperglycemia, I'm concerned that he is on the verge of DKA. Will admit for fluids, hyperglycemia control, as well as TTS consultation once medically stable.  Final Clinical Impressions(s) / ED Diagnoses   Final diagnoses:  Suicidal ideation  Hyperglycemia  Dehydration    New Prescriptions New Prescriptions   No medications on file     Shaune Pollack, MD 02/23/16 1113    Shaune Pollack, MD 02/23/16 1133

## 2016-02-24 ENCOUNTER — Observation Stay (HOSPITAL_COMMUNITY): Payer: Medicaid Other

## 2016-02-24 DIAGNOSIS — E1310 Other specified diabetes mellitus with ketoacidosis without coma: Secondary | ICD-10-CM

## 2016-02-24 DIAGNOSIS — R739 Hyperglycemia, unspecified: Secondary | ICD-10-CM | POA: Diagnosis present

## 2016-02-24 DIAGNOSIS — R45851 Suicidal ideations: Secondary | ICD-10-CM | POA: Diagnosis present

## 2016-02-24 DIAGNOSIS — R110 Nausea: Secondary | ICD-10-CM | POA: Diagnosis not present

## 2016-02-24 DIAGNOSIS — Z794 Long term (current) use of insulin: Secondary | ICD-10-CM

## 2016-02-24 DIAGNOSIS — E0590 Thyrotoxicosis, unspecified without thyrotoxic crisis or storm: Secondary | ICD-10-CM | POA: Diagnosis present

## 2016-02-24 DIAGNOSIS — E860 Dehydration: Secondary | ICD-10-CM | POA: Diagnosis present

## 2016-02-24 DIAGNOSIS — K5900 Constipation, unspecified: Secondary | ICD-10-CM | POA: Diagnosis not present

## 2016-02-24 DIAGNOSIS — I959 Hypotension, unspecified: Secondary | ICD-10-CM | POA: Diagnosis not present

## 2016-02-24 DIAGNOSIS — R1031 Right lower quadrant pain: Secondary | ICD-10-CM | POA: Diagnosis present

## 2016-02-24 DIAGNOSIS — E871 Hypo-osmolality and hyponatremia: Secondary | ICD-10-CM | POA: Diagnosis present

## 2016-02-24 DIAGNOSIS — F17210 Nicotine dependence, cigarettes, uncomplicated: Secondary | ICD-10-CM

## 2016-02-24 DIAGNOSIS — E1110 Type 2 diabetes mellitus with ketoacidosis without coma: Secondary | ICD-10-CM

## 2016-02-24 DIAGNOSIS — F4321 Adjustment disorder with depressed mood: Secondary | ICD-10-CM | POA: Diagnosis present

## 2016-02-24 DIAGNOSIS — F339 Major depressive disorder, recurrent, unspecified: Secondary | ICD-10-CM | POA: Diagnosis present

## 2016-02-24 DIAGNOSIS — K824 Cholesterolosis of gallbladder: Secondary | ICD-10-CM | POA: Diagnosis present

## 2016-02-24 DIAGNOSIS — F4325 Adjustment disorder with mixed disturbance of emotions and conduct: Secondary | ICD-10-CM

## 2016-02-24 DIAGNOSIS — E1100 Type 2 diabetes mellitus with hyperosmolarity without nonketotic hyperglycemic-hyperosmolar coma (NKHHC): Secondary | ICD-10-CM | POA: Diagnosis present

## 2016-02-24 DIAGNOSIS — Z9114 Patient's other noncompliance with medication regimen: Secondary | ICD-10-CM | POA: Diagnosis not present

## 2016-02-24 DIAGNOSIS — G8929 Other chronic pain: Secondary | ICD-10-CM | POA: Diagnosis present

## 2016-02-24 DIAGNOSIS — E1140 Type 2 diabetes mellitus with diabetic neuropathy, unspecified: Secondary | ICD-10-CM | POA: Diagnosis present

## 2016-02-24 DIAGNOSIS — Z599 Problem related to housing and economic circumstances, unspecified: Secondary | ICD-10-CM | POA: Diagnosis not present

## 2016-02-24 DIAGNOSIS — I700 Atherosclerosis of aorta: Secondary | ICD-10-CM | POA: Diagnosis present

## 2016-02-24 DIAGNOSIS — E875 Hyperkalemia: Secondary | ICD-10-CM | POA: Diagnosis not present

## 2016-02-24 DIAGNOSIS — Z79899 Other long term (current) drug therapy: Secondary | ICD-10-CM

## 2016-02-24 LAB — CBC
HCT: 35.8 % — ABNORMAL LOW (ref 39.0–52.0)
Hemoglobin: 12.6 g/dL — ABNORMAL LOW (ref 13.0–17.0)
MCH: 32.2 pg (ref 26.0–34.0)
MCHC: 35.2 g/dL (ref 30.0–36.0)
MCV: 91.6 fL (ref 78.0–100.0)
PLATELETS: 167 10*3/uL (ref 150–400)
RBC: 3.91 MIL/uL — ABNORMAL LOW (ref 4.22–5.81)
RDW: 12.4 % (ref 11.5–15.5)
WBC: 6.8 10*3/uL (ref 4.0–10.5)

## 2016-02-24 LAB — BASIC METABOLIC PANEL
ANION GAP: 4 — AB (ref 5–15)
BUN: 16 mg/dL (ref 6–20)
CALCIUM: 8.6 mg/dL — AB (ref 8.9–10.3)
CO2: 27 mmol/L (ref 22–32)
CREATININE: 0.68 mg/dL (ref 0.61–1.24)
Chloride: 107 mmol/L (ref 101–111)
GFR calc Af Amer: >60 mL/min (ref >60)
GLUCOSE: 73 mg/dL (ref 65–99)
Potassium: 3.7 mmol/L (ref 3.5–5.1)
Sodium: 138 mmol/L (ref 135–145)

## 2016-02-24 LAB — GLUCOSE, CAPILLARY
GLUCOSE-CAPILLARY: 252 mg/dL — AB (ref 65–99)
GLUCOSE-CAPILLARY: 289 mg/dL — AB (ref 65–99)
GLUCOSE-CAPILLARY: 293 mg/dL — AB (ref 65–99)
GLUCOSE-CAPILLARY: 409 mg/dL — AB (ref 65–99)
Glucose-Capillary: 75 mg/dL (ref 65–99)
Glucose-Capillary: 143 mg/dL — ABNORMAL HIGH (ref 65–99)
Glucose-Capillary: 288 mg/dL — ABNORMAL HIGH (ref 65–99)

## 2016-02-24 LAB — HEMOGLOBIN A1C
Hgb A1c MFr Bld: 13.2 % — ABNORMAL HIGH (ref 4.8–5.6)
Mean Plasma Glucose: 332 mg/dL

## 2016-02-24 MED ORDER — INSULIN NPH ISOPHANE & REGULAR (70-30) 100 UNIT/ML ~~LOC~~ SUSP: 10.0000 [IU] | Freq: Two times a day (BID) | SUBCUTANEOUS | 11 refills | Status: DC

## 2016-02-24 MED ORDER — GLUCERNA SHAKE PO LIQD
237.0000 mL | Freq: Three times a day (TID) | ORAL | Status: DC
  Administered 2016-02-24 – 2016-02-26 (×7): 237 mL via ORAL
  Administered 2016-02-27: 10:00:00 via ORAL
  Administered 2016-02-27 – 2016-03-03 (×16): 237 mL via ORAL
  Filled 2016-02-24 (×28): qty 237

## 2016-02-24 MED ORDER — TRAZODONE HCL 50 MG PO TABS
50.0000 mg | ORAL_TABLET | Freq: Every day | ORAL | Status: DC
  Administered 2016-02-24 – 2016-03-02 (×8): 50 mg via ORAL
  Filled 2016-02-24 (×8): qty 1

## 2016-02-24 MED ORDER — INSULIN ASPART PROT & ASPART (70-30 MIX) 100 UNIT/ML ~~LOC~~ SUSP: 10.0000 [IU] | Freq: Three times a day (TID) | SUBCUTANEOUS | 11 refills | Status: DC | PRN

## 2016-02-24 MED ORDER — GABAPENTIN 300 MG PO CAPS
300.0000 mg | ORAL_CAPSULE | Freq: Three times a day (TID) | ORAL | Status: DC
  Administered 2016-02-24 – 2016-02-25 (×4): 300 mg via ORAL
  Filled 2016-02-24 (×4): qty 1

## 2016-02-24 MED ORDER — INSULIN ASPART PROT & ASPART (70-30 MIX) 100 UNIT/ML ~~LOC~~ SUSP
15.0000 [IU] | Freq: Two times a day (BID) | SUBCUTANEOUS | Status: DC
  Administered 2016-02-24 – 2016-02-25 (×2): 15 [IU] via SUBCUTANEOUS

## 2016-02-24 MED ORDER — GABAPENTIN 300 MG PO CAPS: 300.0000 mg | ORAL_CAPSULE | Freq: Three times a day (TID) | ORAL | 2 refills | Status: DC

## 2016-02-24 MED ORDER — INSULIN ASPART PROT & ASPART (70-30 MIX) 100 UNIT/ML ~~LOC~~ SUSP: 10.0000 [IU] | Freq: Two times a day (BID) | SUBCUTANEOUS | 11 refills | Status: DC

## 2016-02-24 MED ORDER — HYDROCODONE-ACETAMINOPHEN 5-325 MG PO TABS
1.0000 | ORAL_TABLET | Freq: Once | ORAL | Status: AC
  Administered 2016-02-24: 1 via ORAL
  Filled 2016-02-24: qty 1

## 2016-02-24 MED ORDER — TRAMADOL HCL 50 MG PO TABS
50.0000 mg | ORAL_TABLET | Freq: Four times a day (QID) | ORAL | Status: DC | PRN
  Administered 2016-02-24 – 2016-02-25 (×4): 50 mg via ORAL
  Filled 2016-02-24 (×4): qty 1

## 2016-02-24 MED ORDER — FLUOXETINE HCL 20 MG PO CAPS
20.0000 mg | ORAL_CAPSULE | Freq: Every day | ORAL | Status: DC
  Administered 2016-02-24 – 2016-03-03 (×9): 20 mg via ORAL
  Filled 2016-02-24 (×9): qty 1

## 2016-02-24 NOTE — Consult Note (Signed)
[place] [facility]iat[facility] Consult   Reason for Consult:  Depression and suicide ideation Referring Physician:  Dr. Lonny Prude Patient Identification: Kenneth [name] MRN:  [id number] Principal Diagnosis: Hyperglycemia Diagnosis:   Patient Active Problem List   Diagnosis Date Noted  . Hyperglycemia [R73.9] 02/23/2016  . Suicidal ideation [R45.851] 02/23/2016  . Volume depletion [E86.9] 12/21/2015  . Metabolic acidosis [E17.4] 12/21/2015  . Diabetes mellitus with nonketotic hyperosmolarity ([place]) [E11.00] 09/21/2014  . DM type 2, goal HbA1c 7%-8% ([place]) [E11.9] 09/21/2014  . Noncompliance with medication regimen [Z91.14]   . DM (diabetes mellitus) ([place]) [E11.9]   . DKA (diabetic ketoacidoses) ([place]) [E13.10] 07/24/2014  . Dehydration [E86.0] 07/24/2014  . Acute renal failure ([place]) [N17.9] 07/24/2014  . Hyperkalemia [E87.5] 07/24/2014  . Hyponatremia [E87.1] 07/24/2014  . Hyperthyroidism [E05.90] 07/24/2014    Total Time spent with patient: 1 hour  Subjective:   Kenneth [name] is a 50 y.o. male patient admitted with suicidal ideation.  HPI:  Kenneth [name] is a 50 y.o. male seen, chart reviewed and case discussed with staff RN and also psychiatric LCSW. Patient continued to endorse symptoms of depression, anxiety and frustration about not able to get psychosocial support, disability and Medicaid services and reportedly living in a disability house and has no food or money. Patient also reportedly not able to the care of his diabetes because of too many worries that he has been facing. Patient reported he can maintain his diabetes and came to the hospital to get additional services otherwise he feels he should end his life even though he does not have any intention or plan at this time. Patient reportedly has limited family support. Patient reported his sister is supportive and help sort out what she can do for him. Patient reported his parents passed away. Patient is not able to contract for  safety without additional services. Patient has no previous history of suicidal attempt. Patient also has no previous psychiatric inpatient treatments. Patient has no evidence of auditory/visual hallucinations, delusions and paranoia.   Past Psychiatric History: Patient has no previous history of acute psychiatric hospitalizations or outpatient medication management.   Risk to Self: Is patient at risk for suicide?: Yes Risk to Others:   Prior Inpatient Therapy:   Prior Outpatient Therapy:    Past Medical History:  Past Medical History:  Diagnosis Date  . Arthritis   . Diabetes mellitus   . DKA (diabetic ketoacidosis) ([place]) 07/2014  . Malnutrition ([place])   . Noncompliance with medication regimen   . Thyroid disease    History reviewed. No pertinent surgical history. Family History:  Family History  Problem Relation Age of Onset  . Diabetes Mother   . Seizures Sister   . Diabetes Sister    Family Psychiatric  History: Noncontributory  Social History:  History  Alcohol Use No     History  Drug Use No    Social History   Social History  . Marital status: Divorced    Spouse name: N/A  . Number of children: N/A  . Years of education: N/A   Social History Main Topics  . Smoking status: Current Every Day Smoker    Packs/day: 0.50    Years: 8.00  . Smokeless tobacco: Never Used  . Alcohol use No  . Drug use: No  . Sexual activity: Not Asked   Other Topics Concern  . None   Social History Narrative  . None   Additional Social History:    Allergies:  No  Known Allergies  Labs:  Results for orders placed or performed during the hospital encounter of 02/23/16 (from the past 48 hour(s))  Urinalysis, Routine w reflex microscopic     Status: Abnormal   Collection Time: 02/23/16  8:56 AM  Result Value Ref Range   Color, Urine YELLOW YELLOW   APPearance CLEAR CLEAR   Specific Gravity, Urine 1.039 (H) 1.005 - 1.030   pH 6.0 5.0 - 8.0   Glucose, UA >1000 (A) NEGATIVE  mg/dL   Hgb urine dipstick NEGATIVE NEGATIVE   Bilirubin Urine NEGATIVE NEGATIVE   Ketones, ur 15 (A) NEGATIVE mg/dL   Protein, ur NEGATIVE NEGATIVE mg/dL   Nitrite NEGATIVE NEGATIVE   Leukocytes, UA NEGATIVE NEGATIVE  Urine microscopic-add on     Status: Abnormal   Collection Time: 02/23/16  8:56 AM  Result Value Ref Range   Squamous Epithelial / LPF 0-5 (A) NONE SEEN   WBC, UA 0-5 0 - 5 WBC/hpf   RBC / HPF NONE SEEN 0 - 5 RBC/hpf   Bacteria, UA RARE (A) NONE SEEN  CBG monitoring, ED     Status: Abnormal   Collection Time: 02/23/16  9:25 AM  Result Value Ref Range   Glucose-Capillary >600 (HH) 65 - 99 mg/dL  Basic metabolic panel     Status: Abnormal   Collection Time: 02/23/16  9:30 AM  Result Value Ref Range   Sodium 125 (L) 135 - 145 mmol/L   Potassium 5.0 3.5 - 5.1 mmol/L   Chloride 84 (L) 101 - 111 mmol/L   CO2 28 22 - 32 mmol/L   Glucose, Bld 777 (HH) 65 - 99 mg/dL    Comment: CRITICAL RESULT CALLED TO, READ BACK BY AND VERIFIED WITH: WEST,S @ 1011 ON 120417 BY POTEAT,S    BUN 11 6 - 20 mg/dL   Creatinine, Ser 0.98 0.61 - 1.24 mg/dL   Calcium 9.0 8.9 - 10.3 mg/dL   GFR calc non Af Amer >60 >60 mL/min   GFR calc Af Amer >60 >60 mL/min    Comment: (NOTE) The eGFR has been calculated using the CKD EPI equation. This calculation has not been validated in all clinical situations. eGFR's persistently <60 mL/min signify possible Chronic Kidney Disease.    Anion gap 13 5 - 15  CBC     Status: None   Collection Time: 02/23/16  9:30 AM  Result Value Ref Range   WBC 9.7 4.0 - 10.5 K/uL   RBC 4.60 4.22 - 5.81 MIL/uL   Hemoglobin 14.9 13.0 - 17.0 g/dL   HCT 42.0 39.0 - 52.0 %   MCV 91.3 78.0 - 100.0 fL   MCH 32.4 26.0 - 34.0 pg   MCHC 35.5 30.0 - 36.0 g/dL   RDW 12.2 11.5 - 15.5 %   Platelets 199 150 - 400 K/uL  Blood gas, venous     Status: Abnormal   Collection Time: 02/23/16 10:05 AM  Result Value Ref Range   FIO2 21.00    Delivery systems ROOM AIR    pH, Ven  7.378 7.250 - 7.430   pCO2, Ven 49.9 44.0 - 60.0 mmHg   pO2, Ven 35.6 32.0 - 45.0 mmHg   Bicarbonate 28.7 (H) 20.0 - 28.0 mmol/L   Acid-Base Excess 3.1 (H) 0.0 - 2.0 mmol/L   O2 Saturation 69.5 %   Patient temperature 98.6    Collection site VEIN    Drawn by [id number]    Sample type VEIN   POC CBG, ED  Status: Abnormal   Collection Time: 02/23/16 11:15 AM  Result Value Ref Range   Glucose-Capillary 584 (HH) 65 - 99 mg/dL   Comment 1 Call MD NNP PA CNM   Glucose, capillary     Status: Abnormal   Collection Time: 02/23/16 12:42 PM  Result Value Ref Range   Glucose-Capillary 443 (H) 65 - 99 mg/dL   Comment 1 Notify RN    Comment 2 Document in Chart   Hemoglobin A1c     Status: Abnormal   Collection Time: 02/23/16  1:39 PM  Result Value Ref Range   Hgb A1c MFr Bld 13.2 (H) 4.8 - 5.6 %    Comment: (NOTE)         Pre-diabetes: 5.7 - 6.4         Diabetes: >6.4         Glycemic control for adults with diabetes: <7.0    Mean Plasma Glucose 332 mg/dL    Comment: (NOTE) Performed At: [facility] [place], [place] [id number] Lindon Romp MD IR:[id number]   Hepatic function panel     Status: Abnormal   Collection Time: 02/23/16  1:39 PM  Result Value Ref Range   Total Protein 6.2 (L) 6.5 - 8.1 g/dL   Albumin 3.7 3.5 - 5.0 g/dL   AST 11 (L) 15 - 41 U/L   ALT 17 17 - 63 U/L   Alkaline Phosphatase 76 38 - 126 U/L   Total Bilirubin 1.1 0.3 - 1.2 mg/dL   Bilirubin, Direct 0.1 0.1 - 0.5 mg/dL   Indirect Bilirubin 1.0 (H) 0.3 - 0.9 mg/dL  Lipase, blood     Status: None   Collection Time: 02/23/16  1:39 PM  Result Value Ref Range   Lipase 21 11 - 51 U/L  Glucose, capillary     Status: Abnormal   Collection Time: 02/23/16  5:02 PM  Result Value Ref Range   Glucose-Capillary 596 (HH) 65 - 99 mg/dL   Comment 1 Notify RN    Comment 2 Document in Chart   Glucose, random     Status: Abnormal   Collection Time: 02/23/16  5:58 PM  Result Value Ref Range    Glucose, Bld 563 (HH) 65 - 99 mg/dL    Comment: CRITICAL RESULT CALLED TO, READ BACK BY AND VERIFIED WITH: C.MILLAR RN AT 1824 ON 02/23/16 BY S.VANHOORNE   Glucose, capillary     Status: Abnormal   Collection Time: 02/23/16  8:04 PM  Result Value Ref Range   Glucose-Capillary 558 (HH) 65 - 99 mg/dL   Comment 1 Notify RN   Glucose, capillary     Status: Abnormal   Collection Time: 02/24/16 12:07 AM  Result Value Ref Range   Glucose-Capillary 252 (H) 65 - 99 mg/dL   Comment 1 Notify RN   Basic metabolic panel     Status: Abnormal   Collection Time: 02/24/16  3:59 AM  Result Value Ref Range   Sodium 138 135 - 145 mmol/L    Comment: DELTA CHECK NOTED   Potassium 3.7 3.5 - 5.1 mmol/L    Comment: DELTA CHECK NOTED   Chloride 107 101 - 111 mmol/L   CO2 27 22 - 32 mmol/L   Glucose, Bld 73 65 - 99 mg/dL   BUN 16 6 - 20 mg/dL   Creatinine, Ser 0.68 0.61 - 1.24 mg/dL   Calcium 8.6 (L) 8.9 - 10.3 mg/dL   GFR calc non Af Amer >60 >60 mL/min  GFR calc Af Amer >60 >60 mL/min    Comment: (NOTE) The eGFR has been calculated using the CKD EPI equation. This calculation has not been validated in all clinical situations. eGFR's persistently <60 mL/min signify possible Chronic Kidney Disease.    Anion gap 4 (L) 5 - 15  CBC     Status: Abnormal   Collection Time: 02/24/16  3:59 AM  Result Value Ref Range   WBC 6.8 4.0 - 10.5 K/uL   RBC 3.91 (L) 4.22 - 5.81 MIL/uL   Hemoglobin 12.6 (L) 13.0 - 17.0 g/dL   HCT 35.8 (L) 39.0 - 52.0 %   MCV 91.6 78.0 - 100.0 fL   MCH 32.2 26.0 - 34.0 pg   MCHC 35.2 30.0 - 36.0 g/dL   RDW 12.4 11.5 - 15.5 %   Platelets 167 150 - 400 K/uL  Glucose, capillary     Status: None   Collection Time: 02/24/16  4:10 AM  Result Value Ref Range   Glucose-Capillary 75 65 - 99 mg/dL   Comment 1 Notify RN   Glucose, capillary     Status: Abnormal   Collection Time: 02/24/16  7:24 AM  Result Value Ref Range   Glucose-Capillary 143 (H) 65 - 99 mg/dL  Glucose, capillary      Status: Abnormal   Collection Time: 02/24/16 11:17 AM  Result Value Ref Range   Glucose-Capillary 288 (H) 65 - 99 mg/dL    Current Facility-Administered Medications  Medication Dose Route Frequency Provider Last Rate Last Dose  . 0.9 %  sodium chloride infusion   Intravenous Continuous Mariel Aloe, MD 125 mL/hr at 02/24/16 0839 1,000 mL at 02/24/16 0839  . acetaminophen (TYLENOL) tablet 650 mg  650 mg Oral Q6H PRN Mariel Aloe, MD       Or  . acetaminophen (TYLENOL) suppository 650 mg  650 mg Rectal Q6H PRN Mariel Aloe, MD      . enoxaparin (LOVENOX) injection 40 mg  40 mg Subcutaneous Q24H Mariel Aloe, MD   40 mg at 21/22/48 2500  . folic acid (FOLVITE) tablet 1 mg  1 mg Oral Daily Mariel Aloe, MD   1 mg at 02/23/16 1504  . insulin aspart (novoLOG) injection 0-15 Units  0-15 Units Subcutaneous Q4H Mariel Aloe, MD   8 Units at 02/24/16 1157  . insulin aspart protamine- aspart (NOVOLOG MIX 70/30) injection 10 Units  10 Units Subcutaneous BID WC Mariel Aloe, MD   10 Units at 02/24/16 [phone]  . LORazepam (ATIVAN) tablet 1 mg  1 mg Oral Q6H PRN Mariel Aloe, MD       Or  . LORazepam (ATIVAN) injection 1 mg  1 mg Intravenous Q6H PRN Mariel Aloe, MD      . multivitamin with minerals tablet 1 tablet  1 tablet Oral Daily Mariel Aloe, MD   1 tablet at 02/23/16 1505  . ondansetron (ZOFRAN) tablet 4 mg  4 mg Oral Q6H PRN Mariel Aloe, MD       Or  . ondansetron [facility]) injection 4 mg  4 mg Intravenous Q6H PRN Mariel Aloe, MD      . thiamine (VITAMIN B-1) tablet 100 mg  100 mg Oral Daily Mariel Aloe, MD   100 mg at 02/23/16 1505   Or  . thiamine (B-1) injection 100 mg  100 mg Intravenous Daily Mariel Aloe, MD      . traMADol Veatrice Bourbon) tablet 50  mg  50 mg Oral Q6H PRN Gardiner Barefoot, NP   50 mg at 02/24/16 9030    Musculoskeletal: Strength & Muscle Tone: decreased Gait & Station: normal Patient leans: N/A  Psychiatric Specialty Exam: Physical Exam  as per history and physical   ROS  Complaining about depression, anxiety, insomnia, lack of psychosocial support and frustrated regarding resected from disability services in several times.  No Fever-chills, No Headache, No changes with Vision or hearing, reports vertigo No problems swallowing food or Liquids, No Chest pain, Cough or Shortness of Breath, No Abdominal pain, No Nausea or Vommitting, Bowel movements are regular, No Blood in stool or Urine, No dysuria, No new skin rashes or bruises, No new joints pains-aches,  No new weakness, tingling, numbness in any extremity, No recent weight gain or loss, No polyuria, polydypsia or polyphagia,   A full 10 point Review of Systems was done, except as stated above, all other Review of Systems were negative.  Blood pressure 105/77, pulse 66, temperature 98 F (36.7 C), temperature source Oral, resp. rate 16, weight 52.8 kg (116 lb 8 oz), SpO2 100 %.Body mass index is 17.2 kg/m.  General Appearance: Disheveled  Eye Contact:  Good  Speech:  Clear and Coherent  Volume:  Decreased  Mood:  Anxious, Depressed, Hopeless and Worthless  Affect:  Appropriate, Congruent and Depressed  Thought Process:  Coherent and Goal Directed  Orientation:  Full (Time, Place, and Person)  Thought Content:  WDL and Rumination  Suicidal Thoughts:  Yes.  without intent/plan  Homicidal Thoughts:  No  Memory:  Immediate;   Fair Recent;   Fair Remote;   Fair  Judgement:  Intact  Insight:  Fair  Psychomotor Activity:  Decreased  Concentration:  Concentration: Fair and Attention Span: Fair  Recall:  Good  Fund of Knowledge:  Good  Language:  Good  Akathisia:  Negative  Handed:  Right  AIMS (if indicated):     Assets:  Communication Skills Desire for Improvement Housing Leisure Time Resilience  ADL's:  Intact  Cognition:  WNL  Sleep:        Treatment Plan Summary: Patient has been suffering with increased symptoms of depression, anxiety, insomnia  unable to care for himself and have his passive suicidal ideation. Patient felt he has been dissected from disability services and staff are having chronic medical condition and not having any income and unable to work for long time.  Patient does not contract for safety We start fluoxetine 20 mg daily for depression and trazodone 50 mg at bedtime for insomnia Daily contact with patient to assess and evaluate symptoms and progress in treatment and Medication management  Case discussed with the psychiatrist LCSW regarding psychosocial support Appreciate psychiatric consultation and follow up as clinically required Please contact [phone] or [phone] if needs further assistance  Disposition: Recommend psychiatric Inpatient admission when medically cleared. Patient does not meet criteria for psychiatric inpatient admission.  Ambrose Finland, MD 02/24/2016 11:59 AM

## 2016-02-24 NOTE — Progress Notes (Signed)
   02/24/16 0900  Clinical Encounter Type  Visited With Patient  Visit Type Initial;Psychological support;Spiritual support  Referral From Nurse  Consult/Referral To Chaplain  Spiritual Encounters  Spiritual Needs Prayer;Emotional;Other (Comment);Grief support Education officer, museum(Pastoral Conversation/Support)  Stress Factors  Patient Stress Factors Loss;Financial concerns;Health changes;Loss of control   I visited with the patient per Spiritual Care consult stating that the patient was having a hard time with his current health condition.  The patient was receptive to my visit and explained that he has felt depressed since his health has declined. He is worried that "somebody will find him dead" from diabetes complications one day. The patient stated that he does not want to die and that he doesn't want to hurt himself. Mr. Marita KansasVernon said that he feels like a burden to the people around him. He is used to working and caring for himself and that makes him "feel like less of a man."  His faith is extremely important to him and prayer is something that eases his anxiety.  Mr. Marita [place] said that he has good emotional support from friends and family. He said that his family helps him financially the best they can. But, he is anxious about getting his disability and medicaid so that he doesn't have to put more stress on his family, who do not have the means to give a lot of money to him.  We prayed and the patient expressed interest in a follow-up visit.   Please, contact Spiritual Care for further assistance.   Chaplain Clint BolderBrittany Cache Decoursey M.Div.

## 2016-02-24 NOTE — Progress Notes (Signed)
[facility] CM [facility] Pt needs to come in for screening for financial eligibility to attend clinic. Unable to make appointment prior to financial screening. This CM placed information on AVS.  Sandford Crazeora Denee Boeder RN,BSN,NCM [phone]

## 2016-02-24 NOTE — Discharge Summary (Addendum)
Physician Discharge Summary  Kenneth RainwaterRandy [name] [name] [name]:[id number] DOB: 11/11/1965 DOA: 02/23/2016  PCP: No PCP Per Patient  Admit date: 02/23/2016 Discharge date: 02/24/2016  Admitted From: home Disposition:  home   Recommendations for Outpatient Follow-up:  1. Needs to be complaint with f/u with free clinic     Discharge Condition:stable CODE STATUS:  Full code   Diet recommendation:  Carb modified Consultations:      Discharge Diagnoses:  Principal Problem:   Diabetes mellitus with nonketotic hyperosmolarity (HCC) Active Problems:   Dehydration   Hyperthyroidism   Noncompliance with medication regimen  financial issues Situational depression  Brief Summary:  Kenneth RainwaterRandy [name] Decoster is a 50 y.o. male with history of DM2, hyperthyroidism who presents for hyperglycemia and suicidal thoughts. He has been admitted a few times this year for uncontrolled glucose. He received a match letter to obtain medications this past summer.  Presented with a glucose of 777,  ketonuria but not acidotic- admitted for concern for developing DKA.   Subjective/ hospital course:  Very difficult to talk to. Tells me today that he is not suicidal but is angry and depressed because no one will help him. He purposely did not take his insulin although he has some at home. He has no income and his sister takes him to the church to get meals. The church goers help him financially. Refusing to communicate properly and yelling at me and RN.  Per notes in Epic, he has [facility], received a Match letter in July to obtain medications, received a vial of 70/30 in Oct. I have explained that I will get case management to help him again. He states he was given a bunch of papers yesterday but no one has helped him.  He is angry and difficult to communicate with.    DM1 Results for Kenneth RainwaterVERNON, Kenneth [name] (MRN [id number]) as of 02/24/2016 14:42  Ref. Range 07/24/2014 08:21 02/23/2016 13:39  Hemoglobin A1C  Latest Ref Range: 4.8 - 5.6 % 11.6 (H) 13.2 (H)   - severely uncontrolled due to erratic insulin use mainly due to non-compliance with outpt follow up  - cont current home insulin for now- - apparently he gets insulin from various places- he last got it from the hospital - he cant really remember what he is on - med rec states this is 70/30 - once his current insulin finishes, I will write a prescription for the over the counter 70/30   Pain   - appears to be neuropathy - will give Gabapentin 300 mg TID- coupon from goodrx printed and given by myself - should be 19 dollars- sister or church goers will need to pay  Depression - appears to be situational- have discussed ways to resolve his social issues- will not start antidepressants or antianxiety meds in non-compliant patient.  - denies intentions to commit suicide to me  Hyperthryoid? Results for Kenneth RainwaterVERNON, Kenneth [name] (MRN [id number]) as of 02/24/2016 14:42  Ref. Range 01/18/2015 23:56 12/22/2015 09:18  TSH Latest Ref Range: 0.350 - 4.500 uIU/mL 0.468 1.373  T4,Free(Direct) Latest Ref Range: 0.61 - 1.12 ng/dL  5.780.87   - previously on Tapazol per old notes? - thyroid functions normal when last checked    Social issues- lack of income - trying to get disability and medicaid- claims he can't work due to pain - When I speak to his sister, she tells me that they have been calling the number for the free clinic but have not had  a response which is questionable as he was initially referred there this summer. [facility] he has had financial issues and case management assistance since spring of 2016.  - multiple numbers given for various free clinics in the area  Per case manager, we cannot make and appt and he will need to be seen for "screening for financial eligibility to attend clinic." we are unable to provide more medications for him as he has already received a match letter this year and therefore I will prescribe 70/30 (can get over the  counter brand for < $10 at [facility]) to use when he runs out of current insulin. He states he is currently on Levemir and Novolog.   4 mm gallbladder polyp - found on ultrasound which was done due to RUQ pain yesterday.      Discharge Instructions  Discharge Instructions    Diet Carb Modified    Complete by:  As directed    Increase activity slowly    Complete by:  As directed        Medication List    STOP taking these medications   insulin NPH Human 100 UNIT/ML injection Commonly known as:  HUMULIN N,NOVOLIN N     TAKE these medications   gabapentin 300 MG capsule Commonly known as:  NEURONTIN Take 1 capsule (300 mg total) by mouth 3 (three) times daily.   insulin aspart protamine- aspart (70-30) 100 UNIT/ML injection Commonly known as:  NOVOLOG MIX 70/30 Inject 0.1 mLs (10 Units total) into the skin 3 (three) times daily with meals as needed. What changed:  how much to take   insulin NPH-regular Human (70-30) 100 UNIT/ML injection Commonly known as:  NOVOLIN 70/30 Inject 10 Units into the skin 2 (two) times daily with a meal. Start taking this when you run out of your current insulin      Follow-up Information    [facility]   Why:  call to make appointment for financial screening and MD appointment. Contact information: [address] [place] [phone]         No Known Allergies   Procedures/Studies:   Dg Chest 2 View  Result Date: 02/23/2016 CLINICAL DATA:  Cough, chest congestion, chest pain, and shortness of breath began this morning. Current smoker. History of seizures and diabetes EXAM: CHEST  2 VIEW COMPARISON:  Portable chest x-ray of December 21, 2015 FINDINGS: The lungs remain mildly hyperinflated. There is no focal infiltrate. There is no pleural effusion or pneumothorax. The heart and pulmonary vascularity are normal. The mediastinum is normal in width. The observed bony thorax  exhibits no acute abnormality. Mild hyperinflation consistent with the patient's smoking history. No pneumonia, CHF, nor other acute cardiopulmonary abnormality. IMPRESSION: No active cardiopulmonary disease. Electronically Signed   By: David  SwazilandJordan M.D.   On: 02/23/2016 11:11   [place] Abdomen Limited Ruq  Result Date: 02/24/2016 CLINICAL DATA:  Right upper quadrant tenderness. EXAM: US ABDOMEN LIMITED - RIGHT UPPER QUADRANT COMPARISON:  KUB 09/16/2012. FINDINGS: Gallbladder: Tiny gallbladder polyps measuring up to 4 mm noted. No gallstones noted. Gallbladder wall thickness normal. Negative Murphy sign. Common bile duct: Diameter: 4.5 mm Liver: No focal lesion identified. Within normal limits in parenchymal echogenicity. IMPRESSION: Tiny gallbladder polyps measuring up to 4 mm. Exam is otherwise unremarkable. Electronically Signed   By: Maisie Fushomas  Register   On: 02/24/2016 08:59       Discharge Exam: Vitals:  02/24/16 0557 02/24/16 1400  BP: 105/77 110/79  Pulse: 66 65  Resp: 16 18  Temp: 98 F (36.7 [name]) 98.1 F (36.7 [name])   Vitals:   02/23/16 1815 02/24/16 0010 02/24/16 0557 02/24/16 1400  BP: 129/79 101/71 105/77 110/79  Pulse: 67 67 66 65  Resp: 16 16 16 18   Temp: 98.2 F (36.8 [name]) 97.9 F (36.6 [name]) 98 F (36.7 [name]) 98.1 F (36.7 [name])  TempSrc: Oral Oral Oral Oral  SpO2: 100% 98% 100% 99%  Weight:        General: Pt is alert, awake, not in acute distress Cardiovascular: RRR, S1/S2 +, no rubs, no gallops Respiratory: CTA bilaterally, no wheezing, no rhonchi Abdominal: Soft, NT, ND, bowel sounds + Extremities: no edema, no cyanosis    The results of significant diagnostics from this hospitalization (including imaging, microbiology, ancillary and laboratory) are listed below for reference.     Microbiology: No results found for this or any previous visit (from the past 240 hour(s)).   Labs: BNP (last 3 results) No results for input(s): BNP in the last 8760 hours. Basic Metabolic  Panel:  Recent Labs Lab 02/23/16 0930 02/23/16 1758 02/24/16 0359  NA 125*  --  138  K 5.0  --  3.7  CL 84*  --  107  CO2 28  --  27  GLUCOSE 777* 563* 73  BUN 11  --  16  CREATININE 0.98  --  0.68  CALCIUM 9.0  --  8.6*   Liver Function Tests:  Recent Labs Lab 02/23/16 1339  AST 11*  ALT 17  ALKPHOS 76  BILITOT 1.1  PROT 6.2*  ALBUMIN 3.7    Recent Labs Lab 02/23/16 1339  LIPASE 21   No results for input(s): AMMONIA in the last 168 hours. CBC:  Recent Labs Lab 02/23/16 0930 02/24/16 0359  WBC 9.7 6.8  HGB 14.9 12.6*  HCT 42.0 35.8*  MCV 91.3 91.6  PLT 199 167   Cardiac Enzymes: No results for input(s): CKTOTAL, CKMB, CKMBINDEX, TROPONINI in the last 168 hours. BNP: Invalid input(s): POCBNP CBG:  Recent Labs Lab 02/23/16 2004 02/24/16 0007 02/24/16 0410 02/24/16 0724 02/24/16 1117  GLUCAP 558* 252* 75 143* 288*   D-Dimer No results for input(s): DDIMER in the last 72 hours. Hgb A1c  Recent Labs  02/23/16 1339  HGBA1C 13.2*   Lipid Profile No results for input(s): CHOL, HDL, LDLCALC, TRIG, CHOLHDL, LDLDIRECT in the last 72 hours. Thyroid function studies No results for input(s): TSH, T4TOTAL, T3FREE, THYROIDAB in the last 72 hours.  Invalid input(s): FREET3 Anemia work up No results for input(s): VITAMINB12, FOLATE, FERRITIN, TIBC, IRON, RETICCTPCT in the last 72 hours. Urinalysis    Component Value Date/Time   COLORURINE YELLOW 02/23/2016 0856   APPEARANCEUR CLEAR 02/23/2016 0856   LABSPEC 1.039 (H) 02/23/2016 0856   PHURINE 6.0 02/23/2016 0856   GLUCOSEU >1000 (A) 02/23/2016 0856   HGBUR NEGATIVE 02/23/2016 0856   BILIRUBINUR NEGATIVE 02/23/2016 0856   KETONESUR 15 (A) 02/23/2016 0856   PROTEINUR NEGATIVE 02/23/2016 0856   UROBILINOGEN 0.2 01/18/2015 2300   NITRITE NEGATIVE 02/23/2016 0856   LEUKOCYTESUR NEGATIVE 02/23/2016 0856   Sepsis Labs Invalid input(s): PROCALCITONIN,  WBC,  LACTICIDVEN Microbiology No results  found for this or any previous visit (from the past 240 hour(s)).   Time coordinating discharge: Over 30 minutes  SIGNED:   Calvert Cantor, MD  [facility] 02/24/2016, 3:09 PM Pager   If 7PM-7AM, please contact night-coverage  [email or web address] Password [email or web address]

## 2016-02-24 NOTE — Progress Notes (Addendum)
Inpatient Diabetes Program Recommendations  AACE/ADA: New Consensus Statement on Inpatient Glycemic Control (2015)  Target Ranges:  Prepandial:   less than 140 mg/dL      Peak postprandial:   less than 180 mg/dL (1-2 hours)      Critically ill patients:  140 - 180 mg/dL   Results for Kenneth [name], Maliik C (MRN [id number]) as of 02/24/2016 10:04  Ref. Range 02/23/2016 09:25 02/23/2016 11:15 02/23/2016 12:42 02/23/2016 17:02 02/23/2016 20:04 02/24/2016 00:07 02/24/2016 04:10 02/24/2016 07:24  Glucose-Capillary Latest Ref Range: 65 - 99 mg/dL >782>600 (HH) 956584 (HH) 213443 (H) 596 (HH) 558 (HH) 252 (H) 75 143 (H)   Results for Kenneth [name], [name] (MRN [id number]) as of 02/24/2016 10:04  Ref. Range 02/23/2016 13:39  Hemoglobin A1C Latest Ref Range: 4.8 - 5.6 % 13.2 (H)     Admit with: Hyperglycemia/ Suicidal (no money to buy insulin)  History: DM  Home DM Meds: 70/30 Insulin (unknown exact dosages- per Home Med Rec, patient relies on using friends' insulin and gives himself insulin based on how he feels)  Current Insulin Orders: 70/30 Insulin- 10 units BID                                       Novolog Moderate Correction Scale/ SSI (0-15 units) Q4 hours       -Patient Unable to buy medications.  Does not have any money.  No Insurance coverage and no PCP.  -Note Care management consulted.  Already taking the least expensive insulin at home.  Reli-on brand 70/30 insulin from [facility] is $25 per vial (Order # [id number]).  Note also patient and his family were given lists of multiple free/low cost clinics in the area.  -Per MD H&P note: "He reports taking insulin when he can whenever someone is willing to give him some, as he does not have access to insulin hisself."  -Note current A1c= 13.2%.  Not surprising since pt not taking insulin consistently due to financial issues.     MD- Note 70/30 Insulin increased to 10 units BID last PM.  Agree with upward adjustment.  May want to change Novolog Moderate  Correction Scale/ SSI (0-15 units) to TID AC + HS (currently ordered Q4 hours and patient eating solid PO diet)     --Will follow patient during hospitalization--  Ambrose FinlandJeannine Johnston Camrie Stock RN, MSN, CDE Diabetes Coordinator Inpatient Glycemic Control Team Team Pager: [phone] (8a-5p)

## 2016-02-24 NOTE — Progress Notes (Signed)
Initial Nutrition Assessment  DOCUMENTATION CODES:   Severe malnutrition in context of social or environmental circumstances, Underweight  INTERVENTION:   -Provide Glucerna Shake po TID, each supplement provides 220 kcal and 10 grams of protein -Encourage PO intake -RD to continue to monitor  NUTRITION DIAGNOSIS:   Malnutrition related to social / environmental circumstances as evidenced by percent weight loss, moderate depletion of body fat, severe depletion of muscle mass.  GOAL:   Patient will meet greater than or equal to 90% of their needs  MONITOR:   PO intake, Supplement acceptance, Labs, Weight trends, I & O's  REASON FOR ASSESSMENT:   Malnutrition Screening Tool    ASSESSMENT:   50 y.o. male with medical history significant of diabetes mellitus type 2, ?seizures, ?thyroid disease. Patient's sister reports she brought the patient to the hospital because of suicidal ideation. He has been feeling this way for weeks. He reports not seeking treatment for his feelings. His plan includes not taking insulin and going into severe hyperglycemia. He reports having nausea with non-bloody, non-bilious vomiting for the past three days, which has worsened. He reports taking insulin when he can whenever someone is willing to give him some, as he does not have access to insulin hisself. He is unsure of what insulin he takes. Stress of his life situation has made his symptoms aggravated.  Patient reports good appetite and eats well when he has access to food. Pt states he receives food once a month from a food pantry. He is financially unable to afford food on his own unless a friend feeds him or he eats a free meal at a church. Pt very distraught during visit and upset that he is not eligible for certain benefits.  Currently PO intake is 100%. States he will eat whatever they bring him. He is interested in trying Glucerna shakes but state he won't be able to afford supplements when he  leaves. Provided Glucerna shake coupons.  Patient would benefit from diet education but major barriers include: financial situation, poor access to food, preoccupation with current situation.  Per chart review, pt has lost 13 lb since 10/13 (10% wt loss x 2 months, significant for time frame). Nutrition-Focused physical exam completed. Findings are moderate fat depletion, severe muscle depletion, and no edema.   Medications: Folic acid tablet daily, Multivitamin with minerals daily, Thiamine tablet daily Labs reviewed: CBGs: 143-288  Diet Order:  Diet Carb Modified Fluid consistency: Thin; Room service appropriate? Yes Diet Carb Modified Fluid consistency: Thin; Room service appropriate? Yes  Skin:  Reviewed, no issues  Last BM:  12/1  Height:   Ht Readings from Last 1 Encounters:  12/21/15 5\' 9"  (1.753 m)    Weight:   Wt Readings from Last 1 Encounters:  02/23/16 116 lb 8 oz (52.8 kg)    Ideal Body Weight:  72.7 kg  BMI:  Body mass index is 17.2 kg/m.  Estimated Nutritional Needs:   Kcal:  1600-1800  Protein:  80-90g  Fluid:  1.6-1.8L/day  EDUCATION NEEDS:   No education needs identified at this time  Tilda FrancoLindsey Nakhi Choi, MS, RD, LDN Pager: [phone] After Hours Pager: [phone]

## 2016-02-24 NOTE — Progress Notes (Addendum)
PROGRESS NOTE    Kenneth [name]  NWG:[id number] DOB: [birth year] DOA: 02/23/2016  PCP: No PCP Per Patient    Brief Summary: Kenneth [name] a 50 y.o.malewith history of DM2, hyperthyroidism who presents for hyperglycemia and suicidal thoughts. He has been admitted a few times this year for uncontrolled glucose. He received a match letter to obtain medications this past summer.  Presented with a glucose of 777,  ketonuria but not acidotic- admitted for concern for developing DKA.   Subjective/ hospital course:  Very difficult to talk to. Tells me today that he is not suicidal but is angry and depressed because no one will help him. He purposely did not take his insulin although he has some at home. He has no income and his sister takes him to the church to get meals. The church goers help him financially. Refusing to communicate properly and yelling at me and RN.  Per notes in Epic, he has had multiple referral to [facility] free clinic, received a Match letter in July to obtain medications, received a vial of 70/30 in Oct. I have explained that I will get case management to help him again. He states he was given a bunch of papers yesterday but no one has helped him.  He is angry and difficult to communicate with.   Assessment and Plan:  DM1 Results for Kenneth [name], Kenneth [name] (MRN [id number]) as of 02/24/2016 14:42  Ref. Range 07/24/2014 08:21 02/23/2016 13:39  Hemoglobin A1C Latest Ref Range: 4.8 - 5.6 % 11.6 (H) 13.2 (H)   - severely uncontrolled due to erratic insulin use mainly due to non-compliance with outpt follow up  - cont current home insulin for now- - apparently he gets insulin from various places- he last got it from the hospital - he cant really remember what he is on - med rec states this is 70/30 -   he will need to go home with  a prescription for the over the counter Humulin 70/30 to use in case his current insulin finishes  Pain   - appears to be neuropathy - will start  Gabapentin 300 mg TID- needs a coupon from goodrx printed - at [employer], should be 19 dollars with this coupon- sister or church goers will need to pay  Depression - appears to be situational- have discussed ways to resolve his social issues- will not start antidepressants or antianxiety meds in non-compliant patient.  - denies intentions to commit suicide to me but psych would like him to be at inpatient psych facility-   Hyperthryoid? Results for Kenneth [name], Kenneth [name] (MRN [id number]) as of 02/24/2016 14:42  Ref. Range 01/18/2015 23:56 12/22/2015 09:18  TSH Latest Ref Range: 0.350 - 4.500 uIU/mL 0.468 1.373  T4,Free(Direct) Latest Ref Range: 0.61 - 1.12 ng/dL  0.10   - previously on Tapazol per old notes? - thyroid functions normal when last checked  - check again in AM  Social issues- lack of income - trying to get disability and medicaid- claims he can't work due to pain - When I speak to his sister, she tells me that they have been calling the number for the free clinic but have not had a response which is questionable as he was initially referred there this summer. Going back in [facility], he has had financial issues and case management assistance since spring of 2016.  - multiple numbers given for various free clinics in the area  Per case manager, we cannot make and appt and he will need  to be seen for "screening for financial eligibility to attend clinic." we are unable to provide more medications for him as he has already received a match letter this year and therefore I will prescribe 70/30 (can get over the counter brand for < $10 at [facility]) to use when he runs out of current insulin.    4 mm gallbladder polyp - found on ultrasound which was done due to RUQ pain yesterday.       DVT prophylaxis:  Lovenox Code Status: full code Family Communication: sister Disposition Plan: per psych- currently medically stable for discharge Consultants:   psych Procedures:     Antimicrobials:  Anti-infectives    None       Objective: Vitals:   02/23/16 1815 02/24/16 0010 02/24/16 0557 02/24/16 1400  BP: 129/79 101/71 105/77 110/79  Pulse: 67 67 66 65  Resp: 16 16 16 18   Temp: 98.2 F (36.8 C) 97.9 F (36.6 C) 98 F (36.7 C) 98.1 F (36.7 C)  TempSrc: Oral Oral Oral Oral  SpO2: 100% 98% 100% 99%  Weight:        Intake/Output Summary (Last 24 hours) at 02/24/16 1619 Last data filed at 02/24/16 1533  Gross per 24 hour  Intake          5020.42 ml  Output             3100 ml  Net          1920.42 ml   Filed Weights   02/23/16 1248  Weight: 52.8 kg (116 lb 8 oz)    Examination: Refusing exam  Data Reviewed: I have personally reviewed following labs and imaging studies  CBC:  Recent Labs Lab 02/23/16 0930 02/24/16 0359  WBC 9.7 6.8  HGB 14.9 12.6*  HCT 42.0 35.8*  MCV 91.3 91.6  PLT 199 167   Basic Metabolic Panel:  Recent Labs Lab 02/23/16 0930 02/23/16 1758 02/24/16 0359  NA 125*  --  138  K 5.0  --  3.7  CL 84*  --  107  CO2 28  --  27  GLUCOSE 777* 563* 73  BUN 11  --  16  CREATININE 0.98  --  0.68  CALCIUM 9.0  --  8.6*   GFR: Estimated Creatinine Clearance: 80.7 mL/min (by C-G formula based on SCr of 0.68 mg/dL). Liver Function Tests:  Recent Labs Lab 02/23/16 1339  AST 11*  ALT 17  ALKPHOS 76  BILITOT 1.1  PROT 6.2*  ALBUMIN 3.7    Recent Labs Lab 02/23/16 1339  LIPASE 21   No results for input(s): AMMONIA in the last 168 hours. Coagulation Profile: No results for input(s): INR, PROTIME in the last 168 hours. Cardiac Enzymes: No results for input(s): CKTOTAL, CKMB, CKMBINDEX, TROPONINI in the last 168 hours. BNP (last 3 results) No results for input(s): PROBNP in the last 8760 hours. HbA1C:  Recent Labs  02/23/16 1339  HGBA1C 13.2*   CBG:  Recent Labs Lab 02/24/16 0007 02/24/16 0410 02/24/16 0724 02/24/16 1117 02/24/16 1606  GLUCAP 252* 75 143* 288* 289*   Lipid  Profile: No results for input(s): CHOL, HDL, LDLCALC, TRIG, CHOLHDL, LDLDIRECT in the last 72 hours. Thyroid Function Tests: No results for input(s): TSH, T4TOTAL, FREET4, T3FREE, THYROIDAB in the last 72 hours. Anemia Panel: No results for input(s): VITAMINB12, FOLATE, FERRITIN, TIBC, IRON, RETICCTPCT in the last 72 hours. Urine analysis:    Component Value Date/Time   COLORURINE YELLOW 02/23/2016 0856   APPEARANCEUR  CLEAR 02/23/2016 0856   LABSPEC 1.039 (H) 02/23/2016 0856   PHURINE 6.0 02/23/2016 0856   GLUCOSEU >1000 (A) 02/23/2016 0856   HGBUR NEGATIVE 02/23/2016 0856   BILIRUBINUR NEGATIVE 02/23/2016 0856   KETONESUR 15 (A) 02/23/2016 0856   PROTEINUR NEGATIVE 02/23/2016 0856   UROBILINOGEN 0.2 01/18/2015 2300   NITRITE NEGATIVE 02/23/2016 0856   LEUKOCYTESUR NEGATIVE 02/23/2016 0856   Sepsis Labs: @LABRCNTIP (procalcitonin:4,lacticidven:4) )No results found for this or any previous visit (from the past 240 hour(s)).       Radiology Studies: Dg Chest 2 View  Result Date: 02/23/2016 CLINICAL DATA:  Cough, chest congestion, chest pain, and shortness of breath began this morning. Current smoker. History of seizures and diabetes EXAM: CHEST  2 VIEW COMPARISON:  Portable chest x-ray of December 21, 2015 FINDINGS: The lungs remain mildly hyperinflated. There is no focal infiltrate. There is no pleural effusion or pneumothorax. The heart and pulmonary vascularity are normal. The mediastinum is normal in width. The observed bony thorax exhibits no acute abnormality. Mild hyperinflation consistent with the patient's smoking history. No pneumonia, CHF, nor other acute cardiopulmonary abnormality. IMPRESSION: No active cardiopulmonary disease. Electronically Signed   By: David  SwazilandJordan M.D.   On: 02/23/2016 11:11   [place] Abdomen Limited Ruq  Result Date: 02/24/2016 CLINICAL DATA:  Right upper quadrant tenderness. EXAM: US ABDOMEN LIMITED - RIGHT UPPER QUADRANT COMPARISON:  KUB 09/16/2012.  FINDINGS: Gallbladder: Tiny gallbladder polyps measuring up to 4 mm noted. No gallstones noted. Gallbladder wall thickness normal. Negative Murphy sign. Common bile duct: Diameter: 4.5 mm Liver: No focal lesion identified. Within normal limits in parenchymal echogenicity. IMPRESSION: Tiny gallbladder polyps measuring up to 4 mm. Exam is otherwise unremarkable. Electronically Signed   By: Maisie Fushomas  Register   On: 02/24/2016 08:59      Scheduled Meds: . enoxaparin (LOVENOX) injection  40 mg Subcutaneous Q24H  . feeding supplement (GLUCERNA SHAKE)  237 mL Oral TID BM  . folic acid  1 mg Oral Daily  . insulin aspart  0-15 Units Subcutaneous Q4H  . insulin aspart protamine- aspart  10 Units Subcutaneous BID WC  . multivitamin with minerals  1 tablet Oral Daily  . thiamine  100 mg Oral Daily   Or  . thiamine  100 mg Intravenous Daily   Continuous Infusions: . sodium chloride 1,000 mL (02/24/16 0839)     LOS: 0 days    Time spent in minutes: 35    Kenneth Dosher, MD [facility] Pager: [email or web address] Password [email or web address] 02/24/2016, 4:19 PM

## 2016-02-24 NOTE — Progress Notes (Signed)
This CM left message with RN at [facility] to make a follow up appointment for pt. [phone]). Awaiting return call back. Sandford Crazeora Nidal Rivet RN,BSN,NCM [phone]

## 2016-02-25 LAB — CBC WITH DIFFERENTIAL/PLATELET
BASOS PCT: 1 %
Basophils Absolute: 0 10*3/uL (ref 0.0–0.1)
EOS ABS: 0.3 10*3/uL (ref 0.0–0.7)
EOS PCT: 6 %
HCT: 35.4 % — ABNORMAL LOW (ref 39.0–52.0)
Hemoglobin: 12.5 g/dL — ABNORMAL LOW (ref 13.0–17.0)
LYMPHS ABS: 1.5 10*3/uL (ref 0.7–4.0)
Lymphocytes Relative: 30 %
MCH: 32.6 pg (ref 26.0–34.0)
MCHC: 35.3 g/dL (ref 30.0–36.0)
MCV: 92.2 fL (ref 78.0–100.0)
MONO ABS: 0.4 10*3/uL (ref 0.1–1.0)
MONOS PCT: 8 %
NEUTROS PCT: 55 %
Neutro Abs: 2.9 10*3/uL (ref 1.7–7.7)
PLATELETS: 130 10*3/uL — AB (ref 150–400)
RBC: 3.84 MIL/uL — ABNORMAL LOW (ref 4.22–5.81)
RDW: 12.2 % (ref 11.5–15.5)
WBC: 5.2 10*3/uL (ref 4.0–10.5)

## 2016-02-25 LAB — COMPREHENSIVE METABOLIC PANEL
ALBUMIN: 3.1 g/dL — AB (ref 3.5–5.0)
ALT: 16 U/L — ABNORMAL LOW (ref 17–63)
AST: 20 U/L (ref 15–41)
Alkaline Phosphatase: 66 U/L (ref 38–126)
Anion gap: 6 (ref 5–15)
BILIRUBIN TOTAL: 0.4 mg/dL (ref 0.3–1.2)
BUN: 9 mg/dL (ref 6–20)
CHLORIDE: 100 mmol/L — AB (ref 101–111)
CO2: 30 mmol/L (ref 22–32)
CREATININE: 0.64 mg/dL (ref 0.61–1.24)
Calcium: 8.1 mg/dL — ABNORMAL LOW (ref 8.9–10.3)
GFR calc Af Amer: >60 mL/min (ref >60)
Glucose, Bld: 235 mg/dL — ABNORMAL HIGH (ref 65–99)
Potassium: 4.1 mmol/L (ref 3.5–5.1)
SODIUM: 136 mmol/L (ref 135–145)
Total Protein: 5.5 g/dL — ABNORMAL LOW (ref 6.5–8.1)

## 2016-02-25 LAB — GLUCOSE, CAPILLARY
GLUCOSE-CAPILLARY: 246 mg/dL — AB (ref 65–99)
GLUCOSE-CAPILLARY: 365 mg/dL — AB (ref 65–99)
Glucose-Capillary: 324 mg/dL — ABNORMAL HIGH (ref 65–99)
Glucose-Capillary: 228 mg/dL — ABNORMAL HIGH (ref 65–99)
Glucose-Capillary: 213 mg/dL — ABNORMAL HIGH (ref 65–99)
Glucose-Capillary: 231 mg/dL — ABNORMAL HIGH (ref 65–99)

## 2016-02-25 LAB — MAGNESIUM: MAGNESIUM: 1.7 mg/dL (ref 1.7–2.4)

## 2016-02-25 LAB — T4, FREE: FREE T4: 0.82 ng/dL (ref 0.61–1.12)

## 2016-02-25 LAB — PHOSPHORUS: Phosphorus: 3.1 mg/dL (ref 2.5–4.6)

## 2016-02-25 LAB — TSH: TSH: 1.531 u[IU]/mL (ref 0.350–4.500)

## 2016-02-25 MED ORDER — INSULIN ASPART PROT & ASPART (70-30 MIX) 100 UNIT/ML ~~LOC~~ SUSP
15.0000 [IU] | Freq: Two times a day (BID) | SUBCUTANEOUS | Status: DC
  Administered 2016-02-26: 15 [IU] via SUBCUTANEOUS

## 2016-02-25 MED ORDER — INSULIN ASPART 100 UNIT/ML ~~LOC~~ SOLN
0.0000 [IU] | Freq: Three times a day (TID) | SUBCUTANEOUS | Status: DC
  Administered 2016-02-25 – 2016-02-26 (×2): 5 [IU] via SUBCUTANEOUS
  Administered 2016-02-26: 3 [IU] via SUBCUTANEOUS
  Administered 2016-02-26: 15 [IU] via SUBCUTANEOUS
  Administered 2016-02-27: 3 [IU] via SUBCUTANEOUS
  Administered 2016-02-27: 8 [IU] via SUBCUTANEOUS
  Administered 2016-02-27: 11 [IU] via SUBCUTANEOUS
  Administered 2016-02-28: 8 [IU] via SUBCUTANEOUS
  Administered 2016-02-28: 15 [IU] via SUBCUTANEOUS
  Administered 2016-02-29: 2 [IU] via SUBCUTANEOUS
  Administered 2016-02-29: 3 [IU] via SUBCUTANEOUS
  Administered 2016-03-01: 11 [IU] via SUBCUTANEOUS
  Administered 2016-03-01 – 2016-03-02 (×2): 5 [IU] via SUBCUTANEOUS
  Administered 2016-03-02: 8 [IU] via SUBCUTANEOUS
  Administered 2016-03-03: 5 [IU] via SUBCUTANEOUS
  Administered 2016-03-03: 8 [IU] via SUBCUTANEOUS

## 2016-02-25 MED ORDER — GABAPENTIN 400 MG PO CAPS
400.0000 mg | ORAL_CAPSULE | Freq: Three times a day (TID) | ORAL | Status: DC
  Administered 2016-02-25 – 2016-02-28 (×10): 400 mg via ORAL
  Filled 2016-02-25 (×11): qty 1

## 2016-02-25 MED ORDER — INSULIN ASPART PROT & ASPART (70-30 MIX) 100 UNIT/ML ~~LOC~~ SUSP: 20.0000 [IU] | Freq: Two times a day (BID) | SUBCUTANEOUS | Status: DC

## 2016-02-25 MED ORDER — HYDROCODONE-ACETAMINOPHEN 5-325 MG PO TABS
1.0000 | ORAL_TABLET | Freq: Once | ORAL | Status: AC
  Administered 2016-02-25: 1 via ORAL
  Filled 2016-02-25: qty 1

## 2016-02-25 MED ORDER — INSULIN ASPART 100 UNIT/ML ~~LOC~~ SOLN
0.0000 [IU] | Freq: Every day | SUBCUTANEOUS | Status: DC
  Administered 2016-02-25 – 2016-02-26 (×2): 2 [IU] via SUBCUTANEOUS

## 2016-02-25 MED ORDER — TRAMADOL HCL 50 MG PO TABS
100.0000 mg | ORAL_TABLET | Freq: Four times a day (QID) | ORAL | Status: DC | PRN
  Administered 2016-02-25 – 2016-03-03 (×18): 100 mg via ORAL
  Filled 2016-02-25 (×18): qty 2

## 2016-02-25 NOTE — Progress Notes (Signed)
Inpatient Diabetes Program Recommendations  AACE/ADA: New Consensus Statement on Inpatient Glycemic Control (2015)  Target Ranges:  Prepandial:   less than 140 mg/dL      Peak postprandial:   less than 180 mg/dL (1-2 hours)      Critically ill patients:  140 - 180 mg/dL   Results for Kenneth [name], Kenneth [name] (MRN [id number]) as of 02/25/2016 14:15  Ref. Range 02/24/2016 07:24 02/24/2016 11:17 02/24/2016 16:06 02/24/2016 20:21 02/24/2016 22:54  Glucose-Capillary Latest Ref Range: 65 - 99 mg/dL 409143 (H) 811288 (H) 914289 (H) 409 (H) 293 (H)   Results for Kenneth [name], Kenneth [name] (MRN [id number]) as of 02/25/2016 14:15  Ref. Range 02/25/2016 01:48 02/25/2016 05:00 02/25/2016 07:46 02/25/2016 12:50  Glucose-Capillary Latest Ref Range: 65 - 99 mg/dL 086324 (H) 578228 (H) 469246 (H) 365 (H)     Admit with: Hyperglycemia/ Suicidal (no money to buy insulin)  History: DM  Home DM Meds: 70/30 Insulin (unknown exact dosages- per Home Med Rec, patient relies on using friends' insulin and gives himself insulin based on how he feels)  Current Insulin Orders: 70/30 Insulin- 15 units BID      MD- Please consider adding back Novolog Moderate Correction Scale/ SSI (0-15 units) TID AC + HS while patient remains here in hospital  May also consider increasing 70/30 Insulin to 20 units BID with meals     --Will follow patient during hospitalization--  Ambrose FinlandJeannine Johnston Radiah Lubinski RN, MSN, CDE Diabetes Coordinator Inpatient Glycemic Control Team Team Pager: [phone] (8a-5p)

## 2016-02-25 NOTE — Progress Notes (Signed)
PROGRESS NOTE    Tommy RainwaterRandy C Coey  ZOX:[id number] DOB: 02/20/1966 DOA: 02/23/2016 PCP: No PCP Per Patient   Brief Narrative:  [name] a 50 y.o.malewith history of DM2, hyperthyroidism who presents for hyperglycemia and suicidal thoughts. He has been admitted a few times this year for uncontrolled glucose. He received a match letter to obtain medications this past summer. Presented with a glucose of 777, ketonuria but not acidotic- admitted for concern for developing DKA. Patient improved and [facility].   Assessment & Plan:   Principal Problem:   Diabetes mellitus with nonketotic hyperosmolarity (HCC) Active Problems:   Dehydration   Hyperthyroidism   Noncompliance with medication regimen   Hyperglycemia  DM1 -Patient's HbA1c went from 11.6 -> 13.2 - Severely uncontrolled due to erratic insulin use mainly due to non-compliance with outpt follow up  - C/w Insulin 70/30 Mix 15 units sq BID with Meals and Added back Moderate Novolog SSI -D/C'd IV Fluids - Continue to Monitor CBG's  Left Sided Pain   - Appears to be neuropathy - C/w Gabapentin 300 mg TID- coupon from goodrx printed and given by [name] yesterday- should be 19 dollars- sister or church goers will need to pay - One Time Dose of Hydrocodne-Acetaminophen   Depression - Appears to be Situational-  - Denied intentions to commit suicide to me today but states life is not worth fighting for. [facility] if BS under control.  - Fluoxetine 20 mg po Daily started by Psychiatry as well as 50 mg po Trazadone for Insomina  Social Issues- lack of income - Trying to get disability and medicaid- claims he can't work due to pain - Per Sports coachcase manager, we cannot make and appt and he will need to be seen for "screening for financial eligibility to attend clinic." we are unable to provide more medications for him as he has already received a match letter this  year -Will prescribe 70/30 (can get over the counter brand for < $10 at [facility]) to use when he runs out of current insulin. He states he is currently on Levemir and Novolog.  - Will  4 mm gallbladder polyp - found on ultrasound which was done due to RUQ pain.   DVT prophylaxis: Lovenox sq Code Status: FULL CODE Family Communication: No Family present at bedside Disposition Plan: Per Psych [facility] if IVF D/C'd and if Blood Sugar is less than 350  Consultants:   Pyschiatry  Procedures: None  Antimicrobials: None  Subjective: Seen and examined at bedside and stated his left side was hurting. Denied Suicidal ideation but stated he did not have a reason to live and was tired of "fighting." Denied any other complaints or concerns.    Objective: Vitals:   02/24/16 2100 02/25/16 0500 02/25/16 0810 02/25/16 1555  BP: 93/63 93/71  115/86  Pulse:  62  69  Resp: 16 16  18   Temp: 98.2 F (36.8 C) 97.9 F (36.6 C)  97.9 F (36.6 C)  TempSrc: Oral Oral  Oral  SpO2: 98% 99%  100%  Weight:      Height:   5\' 9"  (1.753 m)     Intake/Output Summary (Last 24 hours) at 02/25/16 1926 Last data filed at 02/25/16 1555  Gross per 24 hour  Intake          2666.25 ml  Output  4175 ml  Net         -1508.75 ml   Filed Weights   02/23/16 1248  Weight: 52.8 kg (116 lb 8 oz)    Examination: Physical Exam:  Constitutional: WN/WD, NAD and appears calm and comfortable Eyes: Lids and conjunctivae normal, sclerae anicteric  ENMT: External Ears, Nose appear normal. Grossly normal hearing.  Neck: Appears normal, supple, no cervical masses, normal ROM, no appreciable thyromegaly; No JVD Respiratory: Clear to auscultation bilaterally, no wheezing, rales, rhonchi or crackles. Normal respiratory effort and patient is not tachypenic. No accessory muscle use.  Cardiovascular: RRR, no murmurs / rubs / gallops. S1 and S2 auscultated. No extremity edema.  Abdomen: Soft,  non-tender, non-distended. No masses palpated. No appreciable hepatosplenomegaly. Bowel sounds positive.  GU: Deferred. Musculoskeletal: No clubbing / cyanosis of digits/nails. No joint deformity upper and lower extremities.  Skin: No rashes, lesions, ulcers. No induration; Warm and dry.  Neurologic: CN 2-12 grossly intact with no focal deficits. Sensation intact in all 4 Extremities. Romberg sign cerebellar reflexes not assessed.  Psychiatric: Normal judgment and insight. Alert and oriented x 3. Depressed mood and flat affect.   Data Reviewed: I have personally reviewed following labs and imaging studies  CBC:  Recent Labs Lab 02/23/16 0930 02/24/16 0359 02/25/16 1809  WBC 9.7 6.8 5.2  NEUTROABS  --   --  2.9  HGB 14.9 12.6* 12.5*  HCT 42.0 35.8* 35.4*  MCV 91.3 91.6 92.2  PLT 199 167 130*   Basic Metabolic Panel:  Recent Labs Lab 02/23/16 0930 02/23/16 1758 02/24/16 0359 02/25/16 1809  NA 125*  --  138 136  K 5.0  --  3.7 4.1  CL 84*  --  107 100*  CO2 28  --  27 30  GLUCOSE 777* 563* 73 235*  BUN 11  --  16 9  CREATININE 0.98  --  0.68 0.64  CALCIUM 9.0  --  8.6* 8.1*  MG  --   --   --  1.7  PHOS  --   --   --  3.1   GFR: Estimated Creatinine Clearance: 80.7 mL/min (by C-G formula based on SCr of 0.64 mg/dL). Liver Function Tests:  Recent Labs Lab 02/23/16 1339 02/25/16 1809  AST 11* 20  ALT 17 16*  ALKPHOS 76 66  BILITOT 1.1 0.4  PROT 6.2* 5.5*  ALBUMIN 3.7 3.1*    Recent Labs Lab 02/23/16 1339  LIPASE 21   No results for input(s): AMMONIA in the last 168 hours. Coagulation Profile: No results for input(s): INR, PROTIME in the last 168 hours. Cardiac Enzymes: No results for input(s): CKTOTAL, CKMB, CKMBINDEX, TROPONINI in the last 168 hours. BNP (last 3 results) No results for input(s): PROBNP in the last 8760 hours. HbA1C:  Recent Labs  02/23/16 1339  HGBA1C 13.2*   CBG:  Recent Labs Lab 02/25/16 0148 02/25/16 0500 02/25/16 0746  02/25/16 1250 02/25/16 1653  GLUCAP 324* 228* 246* 365* 213*   Lipid Profile: No results for input(s): CHOL, HDL, LDLCALC, TRIG, CHOLHDL, LDLDIRECT in the last 72 hours. Thyroid Function Tests:  Recent Labs  02/25/16 0401  TSH 1.531  FREET4 0.82   Anemia Panel: No results for input(s): VITAMINB12, FOLATE, FERRITIN, TIBC, IRON, RETICCTPCT in the last 72 hours. Sepsis Labs: No results for input(s): PROCALCITON, LATICACIDVEN in the last 168 hours.  No results found for this or any previous visit (from the past 240 hour(s)).   Radiology Studies: US Abdomen Limited Ruq  Result Date: 02/24/2016 CLINICAL DATA:  Right upper quadrant tenderness. EXAM: US ABDOMEN LIMITED - RIGHT UPPER QUADRANT COMPARISON:  KUB 09/16/2012. FINDINGS: Gallbladder: Tiny gallbladder polyps measuring up to 4 mm noted. No gallstones noted. Gallbladder wall thickness normal. Negative Murphy sign. Common bile duct: Diameter: 4.5 mm Liver: No focal lesion identified. Within normal limits in parenchymal echogenicity. IMPRESSION: Tiny gallbladder polyps measuring up to 4 mm. Exam is otherwise unremarkable. Electronically Signed   By: Maisie Fushomas  Register   On: 02/24/2016 08:59   Scheduled Meds: . enoxaparin (LOVENOX) injection  40 mg Subcutaneous Q24H  . feeding supplement (GLUCERNA SHAKE)  237 mL Oral TID BM  . FLUoxetine  20 mg Oral Daily  . folic acid  1 mg Oral Daily  . gabapentin  300 mg Oral TID  . insulin aspart  0-15 Units Subcutaneous TID WC  . insulin aspart  0-5 Units Subcutaneous QHS  . [START ON 02/26/2016] insulin aspart protamine- aspart  15 Units Subcutaneous BID WC  . multivitamin with minerals  1 tablet Oral Daily  . thiamine  100 mg Oral Daily   Or  . thiamine  100 mg Intravenous Daily  . traZODone  50 mg Oral QHS   Continuous Infusions:   LOS: 1 day   Merlene Laughtermair Latif Sheikh, DO [facility] Pager [phone]  If 7PM-7AM, please contact night-coverage [email or web address] Password  [email or web address] 02/25/2016, 7:26 PM

## 2016-02-25 NOTE — Progress Notes (Signed)
Patient expressed if he leaves the hospital he will be alone and he would want to hurt himself. He express content as long as he is in the hospital being taken care of. Will continue to monitor patient.

## 2016-02-25 NOTE — Progress Notes (Signed)
LCSWA will assist with patient disposition to inpatient psych. LCSWA will inform medical staff when bed is available.   Vivi BarrackNicole Dorotea Hand, Theresia MajorsLCSWA, MSW Clinical Social Worker 5E and Psychiatric Service Line [phone] 02/25/2016  7:32 AM

## 2016-02-25 NOTE — Progress Notes (Signed)
LCSWA informed RN, [facility] and pt. Glucose levels will need to less than 350 for at least 24 hours before admission. RN notified.   Vivi BarrackNicole Jerek Meulemans, Theresia MajorsLCSWA, MSW Clinical Social Worker 5E and Psychiatric Service Line [phone] 02/25/2016  4:39 PM

## 2016-02-25 NOTE — Clinical Social Work Psych Assess (Signed)
Clinical Social Work Nature conservation officer  Clinical Social Worker:  Lia Hopping, LCSW Date/Time:  02/25/2016, 7:38 AM Referred By:  Physician Date Referred:  02/25/16 Reason for Referral:  Behavioral Health Issues   Presenting Symptoms/Problems  Presenting Symptoms/Problems(in person's/family's own words): " I can not take this anymore." Patient brought into hospital for suicidal ideations.  Patient reports several stressors about his life situation.     Abuse/Neglect/Trauma History  Abuse/Neglect/Trauma History:  Denies History Abuse/Neglect/Trauma History Comments (indicate dates): Patient does not disclose any abuse, trauma or neglect.    Psychiatric History  Psychiatric History:  Inpatient/Hospitalization Psychiatric Medication:    Current Mental Health Hospitalizations/Previous Mental Health History:  No hx    Current Provider: No current PCP Place and Date:  n/a  Current Medications:    Legend:                    Inactive   Active   Linked          Medications 02/25/16 02/26/16 02/27/16 02/28/16 02/29/16 03/01/16 03/02/16  enoxaparin (LOVENOX) injection 40 mg Dose: 40 mg Freq: Every 24 hours Route: Keys Start: 02/23/16 1800   Admin Instructions:  Pharmacy may adjust. Do NOT expel air bubble from syringe before giving.   1800    1800    1800    1800    1800    1800    1800     feeding supplement (GLUCERNA SHAKE) (GLUCERNA SHAKE) liquid 237 mL Dose: 237 mL Freq: 3 times daily between meals Route: PO Start: 02/24/16 1500   1000   1400   2000    1000   1400   2000    1000   1400   2000    1000   1400   2000    1000   1400   2000    1000   1400   2000    1000   1400   2000     FLUoxetine (PROZAC) capsule 20 mg Dose: 20 mg Freq: Daily Route: PO Start: 02/24/16 1700   1000    1000    1000    1000    1000    6948    5462     folic acid (FOLVITE) tablet 1 mg Dose: 1 mg  Freq: Daily Route: PO Start: 02/23/16 1400   Admin Instructions:  when / if taking POs.   1000    1000    1000    1000    1000    1000    1000     gabapentin (NEURONTIN) capsule 300 mg Dose: 300 mg Freq: 3 times daily Route: PO Start: 02/24/16 1630   1000   1600   2200    1000   1600   2200    1000   1600   2200    1000   1600   2200    1000   1600   2200    1000   1600   2200    1000   1600   2200     insulin aspart protamine- aspart (NOVOLOG MIX 70/30) injection 15 Units Dose: 15 Units Freq: 2 times daily with meals Route: [place] Start: 02/24/16 1700   Admin Instructions:  70/30 Insulin should be given with a meal. NOTE: PRODUCT IS Novolog MIX 70/30 --- NOT FLOORSTOCK   0800   1700    0800   1700  0800   1700    0800   1700    0800   1700    0800   1700    0800   1700     multivitamin with minerals tablet 1 tablet Dose: 1 tablet Freq: Daily Route: PO Start: 02/23/16 1400   Admin Instructions:  when / if taking POs.   1000    1000    1000    1000    1000    1000    1000     thiamine (VITAMIN B-1) tablet 100 mg Dose: 100 mg Freq: Daily Route: PO Start: 02/23/16 1400   1000    1000    1000    1000    1000    1000    1000     Or thiamine (B-1) injection 100 mg Dose: 100 mg Freq: Daily Route: IV Start: 02/23/16 1400   1000    1000    1000    1000    1000    1000    1000     traZODone (DESYREL) tablet 50 mg Dose: 50 mg Freq: Daily at bedtime Route: PO Start: 02/24/16 2200   2200    2200    2200    2200    2200    2200    2200     Medications 02/25/16 02/26/16 02/27/16 02/28/16 02/29/16 03/01/16 03/02/16        Previous Inpatient Admission/Date/Reason: Denies hx   Emotional Health/Current Symptoms  Suicide/Self Harm: Suicidal Ideation (ex. "I can't take anymore, I wish I could disappear") Patient feels he has nothing else to live for as he  cannot obtain healthcare and financial services.  Suicide Attempt in Past (date/description): Patient denies suicide attempt.   Other Harmful Behavior (ex. homicidal ideation) (describe):  Patient denies homicidal ideations.    Psychotic/Dissociative Symptoms  Psychotic/Dissociative Symptoms:  None reported  Other Psychotic/Dissociative Symptoms:  No auditory or visual hallucinations.    Attention/Behavioral Symptoms  Attention/Behavioral Symptoms: Within Normal Limits Other Attention/Behavioral Symptoms: Patient has medical history of diabetes mellitus, DKA, thyroid disease and arthritis.    Cognitive Impairment  Cognitive Impairment:  Orientation - Self, Orientation - Place, Orientation - Situation, Orientation - Time, Within Normal Limits Other Cognitive Impairment:  Alert and oriented.    Mood and Adjustment  Mood and Adjustment:  Guarded   Stress, Anxiety, Trauma, Any Recent Loss/Stressor  Stress, Anxiety, Trauma, Any Recent Loss/Stressor: Relationship, Other - See Comment (Financial Stressors) Anxiety (frequency):  Patient reports feeling overwhelmed and anxious about getting medical insurance and applying for his disability.   Phobia (specify): None reported  Compulsive Behavior (specify):  None reported.   Obsessive Behavior (specify): None reported.   Other Stress, Anxiety, Trauma, Any Recent Loss/Stressor: Patient reports not having others to assist him with applying for services. Patient reports if his mother was still living she would help him.    Substance Abuse/Use  Substance Abuse/Use:  (Patient denies substance use) SBIRT Completed (please refer for detailed history): N/A Self-reported Substance Use (last use and frequency):  None reported.   Urinary Drug Screen Completed: Yes Alcohol Level:  >5   Environment/Housing/Living Arrangement  Environmental/Housing/Living Arrangement:   Who is in the Home: Self  Emergency Contact:  Randal Buba  [phone]   Financial  Financial:  (Medicaid Potnential )   Patient's Strengths and Goals  Patient's Strengths and Goals (patient's own words): " I came to the hospital for help. "   Clinical  Social Worker's Interpretive Summary  Clinical Social Workers Interpretive Summary:  LCSWA and psychiatrist met with patient at bedside, explained reason for consult and pt. agreeable to assessment. Patient reports he is from [place], [place]. Lives alone in a disability apartment. Over the past two weeks he reports feeling depressed due to several stressors in his life. Patient reports financial stressors and struggles to apply for disability, and medical insurance. He reports he has open cases but it has taken 4-5 years and continues. The patient reports he has been laying in the bed at home for past two weeks not really eating or drinking. The patient reports he would rather die than continue to live his current life. Patient reports his sister -Randal Buba that helps to care for him, she brought him to the hospital for help. Patient verbally consented for LCSWA to call sister. The patient denies history of substance use.  At this time patient is voluntary to inpatient psych.  LCSWA will assist with patient disposition.    Disposition  Disposition: Inpatient Referral Made [facility] [place])

## 2016-02-25 NOTE — Progress Notes (Signed)
   02/25/16 1000  Clinical Encounter Type  Visited With Patient  Visit Type Follow-up;Psychological support;Spiritual support  Referral From Nurse  Consult/Referral To Chaplain  Spiritual Encounters  Spiritual Needs Emotional;Other (Comment);Grief support Education officer, museum(Pastoral Conversation/Support)  Stress Factors  Patient Stress Factors Loss;Financial concerns;Lack of caregivers;Health changes   I followed-up with the patient. The nurse also requested that I visit with the patient due to his current emotional state.  The patient was receptive to my visit and wanted to talk about his "depression." Mr. Kenneth [name] stated that he has never felt this low in his life and feels that he has "hit rock bottom."  Since our previous conversation, he has fixated on his financial problems as a source of his depression. He is worried about access to proper food, medication and keeping his housing. He states that he has no income and feels like a burden on others.  Mr. Kenneth [name] is very anxious over getting disability and has anger over not being accepted for disability yet.  He feels as though he is asking for help and nobody will give him the help he needs.  Mr. Kenneth [name] was in pain today and feels that he is not getting better.  Mr. Kenneth [name] did express SI while I was in the room and stated that "he doesn't know what he'll do." Mr. Kenneth [name] stated that he doesn't have an active plan, but that he feels he might do something, alluding to suicide, when his mood changes and he gets so depressed.  He is very receptive to getting psychological help. I did notify the patient that I would have to make staff aware of his suicidal ideations. He understood this and was relieved that he might be able to get help for his depression.  I made the nurse aware of the patient's SI.  Please, contact Spiritual Care for further assistance.   Chaplain Clint BolderBrittany Cherilynn Schomburg M.Div.

## 2016-02-25 NOTE — Progress Notes (Signed)
LCSWA spoke with patient sister about patient disposition to inpatient psych once medically stable. She reports the patient has difficulty caring for self at home. The patient currently lives in a income based apartment 64 dollars a month. The patient does not have income to buy groceries, therefore goes to churches to get food when available. Patient cannot properly nourish himself with the right foods and has difficulty managing his sugars. The patient disability and medicaid are pending at this time. The patient sister reports she helps out when she can, as she is currently disabled.  LCSWA called [facility], Carmie Endennis Donald at [facility], requested to have patient sister call once he discharged from inpatient psych. to follow up with [facility]. LCSWA provided sister with [facility]. She plans to follow up.   Vivi BarrackNicole Narda Fundora, Theresia MajorsLCSWA, MSW Clinical Social Worker 5E and Psychiatric Service Line [phone] 02/25/2016  10:33 AM

## 2016-02-26 LAB — COMPREHENSIVE METABOLIC PANEL
ALBUMIN: 3 g/dL — AB (ref 3.5–5.0)
ALK PHOS: 61 U/L (ref 38–126)
ALT: 15 U/L — AB (ref 17–63)
AST: 18 U/L (ref 15–41)
Anion gap: 5 (ref 5–15)
BUN: 10 mg/dL (ref 6–20)
CALCIUM: 8 mg/dL — AB (ref 8.9–10.3)
CHLORIDE: 99 mmol/L — AB (ref 101–111)
CO2: 28 mmol/L (ref 22–32)
CREATININE: 0.63 mg/dL (ref 0.61–1.24)
GFR calc Af Amer: >60 mL/min (ref >60)
GFR calc non Af Amer: >60 mL/min (ref >60)
GLUCOSE: 404 mg/dL — AB (ref 65–99)
Potassium: 4.1 mmol/L (ref 3.5–5.1)
SODIUM: 132 mmol/L — AB (ref 135–145)
Total Bilirubin: 0.9 mg/dL (ref 0.3–1.2)
Total Protein: 5.2 g/dL — ABNORMAL LOW (ref 6.5–8.1)

## 2016-02-26 LAB — CBC WITH DIFFERENTIAL/PLATELET
BASOS ABS: 0 10*3/uL (ref 0.0–0.1)
BASOS PCT: 1 %
EOS ABS: 0.3 10*3/uL (ref 0.0–0.7)
EOS PCT: 5 %
HCT: 34.7 % — ABNORMAL LOW (ref 39.0–52.0)
HEMOGLOBIN: 12.2 g/dL — AB (ref 13.0–17.0)
Lymphocytes Relative: 23 %
Lymphs Abs: 1.5 10*3/uL (ref 0.7–4.0)
MCH: 32.6 pg (ref 26.0–34.0)
MCHC: 35.2 g/dL (ref 30.0–36.0)
MCV: 92.8 fL (ref 78.0–100.0)
Monocytes Absolute: 0.5 10*3/uL (ref 0.1–1.0)
Monocytes Relative: 8 %
NEUTROS PCT: 63 %
Neutro Abs: 4.1 10*3/uL (ref 1.7–7.7)
PLATELETS: 133 10*3/uL — AB (ref 150–400)
RBC: 3.74 MIL/uL — AB (ref 4.22–5.81)
RDW: 12.3 % (ref 11.5–15.5)
WBC: 6.4 10*3/uL (ref 4.0–10.5)

## 2016-02-26 LAB — GLUCOSE, CAPILLARY
GLUCOSE-CAPILLARY: 240 mg/dL — AB (ref 65–99)
GLUCOSE-CAPILLARY: 332 mg/dL — AB (ref 65–99)
Glucose-Capillary: 446 mg/dL — ABNORMAL HIGH (ref 65–99)
Glucose-Capillary: 335 mg/dL — ABNORMAL HIGH (ref 65–99)
Glucose-Capillary: 199 mg/dL — ABNORMAL HIGH (ref 65–99)
Glucose-Capillary: 216 mg/dL — ABNORMAL HIGH (ref 65–99)

## 2016-02-26 LAB — MAGNESIUM: Magnesium: 1.8 mg/dL (ref 1.7–2.4)

## 2016-02-26 LAB — PHOSPHORUS: PHOSPHORUS: 4 mg/dL (ref 2.5–4.6)

## 2016-02-26 MED ORDER — INSULIN ASPART PROT & ASPART (70-30 MIX) 100 UNIT/ML ~~LOC~~ SUSP
20.0000 [IU] | Freq: Two times a day (BID) | SUBCUTANEOUS | Status: DC
  Administered 2016-02-26 – 2016-02-27 (×2): 20 [IU] via SUBCUTANEOUS

## 2016-02-26 MED ORDER — SODIUM CHLORIDE 0.9 % IV BOLUS (SEPSIS): 1000.0000 mL | Freq: Once | INTRAVENOUS | Status: DC

## 2016-02-26 NOTE — Progress Notes (Signed)
PROGRESS NOTE    Tommy RainwaterRandy [name] Waltz  WUJ:[id number] DOB: 03/24/1965 DOA: 02/23/2016 PCP: No PCP Per Patient   Brief Narrative:  Kenneth [name] Vernonis a 50 y.o.malewith history of DM2, hyperthyroidism who presents for hyperglycemia and suicidal thoughts. He has been admitted a few times this year for uncontrolled glucose. He received a match letter to obtain medications this past summer. Presented with a glucose of 777, ketonuria but not acidotic- admitted for concern for developing DKA. Patient improved and [facility]. Patient's BS was uncontrolled overnight so adjustments have been made. His IV also infiltrated and was removed so patient was encouraged to orally hydrate.  Assessment & Plan:   Principal Problem:   Diabetes mellitus with nonketotic hyperosmolarity (HCC) Active Problems:   Dehydration   Hyperthyroidism   Noncompliance with medication regimen   Hyperglycemia  DM1 -Patient's HbA1c went from 11.6 -> 13.2 - Severely uncontrolled due to erratic insulin use mainly due to non-compliance with outpt follow up  - Increased Insulin 70/30 Mix 15 units sq BID to 20 units with Meals - [name]/wAdded back Moderate Novolog SSI - D/[name]'d IV Fluids - Continue to Monitor CBG's ast they have been ranging from 199-446  Left Sided Pain   - Appears to be neuropathy; Has not improved or worsened.  - [name]/w Gabapentin 300 mg TID- coupon from goodrx printed and given by [name] yesterday- should be 19 dollars- sister or church goers will need to pay - One Time Dose of Hydrocodne-Acetaminophen yesterday - May try Lyrica if not Improved  Depression - Appears to be Situational-  - Denied intentions to commit suicide to me today but states life is not worth fighting for. [facility] if BS under control.  - [name]/w Fluoxetine 20 mg po Daily started by Psychiatry as well as 50 mg po Trazadone for Insomina - [facility] is Under  Control  Mild Hyponatremia - IV Fluids D/[name]'d - Sodium was 132 - Reassess in AM with BMP  Social Issues- lack of income - Trying to get disability and Medicaid- claims he can't work due to pain - Per Sports coachcase manager, we cannot make and appt and he will need to be seen for "screening for financial eligibility to attend clinic." we are unable to provide more medications for him as he has already received a match letter this year -Will prescribe 70/30 (can get over the counter brand for < $10 at [employer]) to use when he runs out of current insulin. He states he is currently on Levemir and Novolog.  - [name]  4 mm gallbladder polyp - found on ultrasound which was done due to RUQ pain.  - Outpatient Follow up  DVT prophylaxis: Lovenox sq Code Status: FULL CODE Family Communication: No Family present at bedside Disposition Plan: [facility] Psych [facility] Blood Sugar is less than 350  Consultants:   Pyschiatry  Procedures: None  Antimicrobials: None  Subjective: Seen and examined at bedside and states he could be better. Still has pain on left side radiating down his leg. No other complaints or concerns.  Objective: Vitals:   02/25/16 1555 02/25/16 2120 02/26/16 0503 02/26/16 1415  BP: 115/86 101/72 113/86 112/83  Pulse: 69 60 74 73  Resp: 18 15 15 16   Temp: 97.9 F (36.6 [name]) 97.7 F (36.5 [name]) 98 F (36.7 [name])  98.4 F (36.9 [name])  TempSrc: Oral Oral Oral Oral  SpO2: 100% 100% 97% 99%  Weight:      Height:        Intake/Output Summary (Last 24 hours) at 02/26/16 1721 Last data filed at 02/26/16 1415  Gross per 24 hour  Intake             1102 ml  Output                0 ml  Net             1102 ml   Filed Weights   02/23/16 1248  Weight: 52.8 kg (116 lb 8 oz)    Examination: Physical Exam:  Constitutional: WN/WD, NAD and appears calm and comfortable Eyes: Lids and conjunctivae normal, sclerae anicteric  ENMT:  External Ears, Nose appear normal. Grossly normal hearing.  Neck: Appears normal, supple, no cervical masses, normal ROM, no appreciable thyromegaly; No JVD Respiratory: Clear to auscultation bilaterally, no wheezing, rales, rhonchi or crackles. Normal respiratory effort and patient is not tachypenic. No accessory muscle use.  Cardiovascular: RRR, no murmurs / rubs / gallops. S1 and S2 auscultated. No extremity edema.  Abdomen: Soft, non-tender, non-distended. No masses palpated. No appreciable hepatosplenomegaly. Bowel sounds positive.  GU: Deferred. Musculoskeletal: No clubbing / cyanosis of digits/nails. No joint deformity upper and lower extremities.  Skin: No rashes, lesions, ulcers. No induration; Warm and dry.  Neurologic: CN 2-12 grossly intact with no focal deficits. Sensation intact in all 4 Extremities. Romberg sign cerebellar reflexes not assessed.  Psychiatric: Normal judgment and insight. Alert and oriented x 3. Depressed mood and flat affect.   Data Reviewed: I have personally reviewed following labs and imaging studies  CBC:  Recent Labs Lab 02/23/16 0930 02/24/16 0359 02/25/16 1809 02/26/16 0345  WBC 9.7 6.8 5.2 6.4  NEUTROABS  --   --  2.9 4.1  HGB 14.9 12.6* 12.5* 12.2*  HCT 42.0 35.8* 35.4* 34.7*  MCV 91.3 91.6 92.2 92.8  PLT 199 167 130* 133*   Basic Metabolic Panel:  Recent Labs Lab 02/23/16 0930 02/23/16 1758 02/24/16 0359 02/25/16 1809 02/26/16 0345  NA 125*  --  138 136 132*  K 5.0  --  3.7 4.1 4.1  CL 84*  --  107 100* 99*  CO2 28  --  27 30 28   GLUCOSE 777* 563* 73 235* 404*  BUN 11  --  16 9 10   CREATININE 0.98  --  0.68 0.64 0.63  CALCIUM 9.0  --  8.6* 8.1* 8.0*  MG  --   --   --  1.7 1.8  PHOS  --   --   --  3.1 4.0   GFR: Estimated Creatinine Clearance: 80.7 mL/min (by [name]-G formula based on SCr of 0.63 mg/dL). Liver Function Tests:  Recent Labs Lab 02/23/16 1339 02/25/16 1809 02/26/16 0345  AST 11* 20 18  ALT 17 16* 15*  ALKPHOS  76 66 61  BILITOT 1.1 0.4 0.9  PROT 6.2* 5.5* 5.2*  ALBUMIN 3.7 3.1* 3.0*    Recent Labs Lab 02/23/16 1339  LIPASE 21   No results for input(s): AMMONIA in the last 168 hours. Coagulation Profile: No results for input(s): INR, PROTIME in the last 168 hours. Cardiac Enzymes: No results for input(s): CKTOTAL, CKMB, CKMBINDEX, TROPONINI in the last 168 hours. BNP (last 3 results) No results for input(s): PROBNP in the last 8760 hours. HbA1C: No results for input(s): HGBA1C in the  last 72 hours. CBG:  Recent Labs Lab 02/26/16 0757 02/26/16 0957 02/26/16 1130 02/26/16 1259 02/26/16 1645  GLUCAP 446* 335* 240* 332* 199*   Lipid Profile: No results for input(s): CHOL, HDL, LDLCALC, TRIG, CHOLHDL, LDLDIRECT in the last 72 hours. Thyroid Function Tests:  Recent Labs  02/25/16 0401  TSH 1.531  FREET4 0.82   Anemia Panel: No results for input(s): VITAMINB12, FOLATE, FERRITIN, TIBC, IRON, RETICCTPCT in the last 72 hours. Sepsis Labs: No results for input(s): PROCALCITON, LATICACIDVEN in the last 168 hours.  No results found for this or any previous visit (from the past 240 hour(s)).   Radiology Studies: No results found. Scheduled Meds: . enoxaparin (LOVENOX) injection  40 mg Subcutaneous Q24H  . feeding supplement (GLUCERNA SHAKE)  237 mL Oral TID BM  . FLUoxetine  20 mg Oral Daily  . folic acid  1 mg Oral Daily  . gabapentin  400 mg Oral TID  . insulin aspart  0-15 Units Subcutaneous TID WC  . insulin aspart  0-5 Units Subcutaneous QHS  . insulin aspart protamine- aspart  20 Units Subcutaneous BID WC  . multivitamin with minerals  1 tablet Oral Daily  . sodium chloride  1,000 mL Intravenous Once  . thiamine  100 mg Oral Daily  . traZODone  50 mg Oral QHS   Continuous Infusions:   LOS: 2 days   Merlene Laughtermair Latif Fantashia Shupert, DO [facility] Pager [phone]  If 7PM-7AM, please contact night-coverage [email or web address] Password [email or web address] 02/26/2016, 5:21 PM

## 2016-02-26 NOTE — Progress Notes (Signed)
Inpatient Diabetes Program Recommendations  AACE/ADA: New Consensus Statement on Inpatient Glycemic Control (2015)  Target Ranges:  Prepandial:   less than 140 mg/dL      Peak postprandial:   less than 180 mg/dL (1-2 hours)      Critically ill patients:  140 - 180 mg/dL   Results for Kenneth RainwaterVERNON, Ketrick C (MRN [id number]) as of 02/26/2016 09:02  Ref. Range 02/25/2016 07:46 02/25/2016 12:50 02/25/2016 16:53 02/25/2016 21:19 02/26/2016 07:57  Glucose-Capillary Latest Ref Range: 65 - 99 mg/dL 119246 (H) 147365 (H) 829213 (H) 231 (H) 446 (H)   Review of Glycemic Control  Diabetes history: DM Outpatient Diabetes medications: 70/30 unknown dose, based on how he feels, relies on friends insulin Current orders for Inpatient glycemic control: 70/30 15 units BID, Novolog Moderate Correction + Novolog HS scale  Inpatient Diabetes Program Recommendations:   Patient still very hyperglycemic 200-400 range while here. Please consider increasing insulin to at least 70/30 20 units BID.  Thanks,  Christena DeemShannon Dawnn Nam RN, MSN, [facility] Inpatient Diabetes Coordinator Team Pager [phone] (8a-5p)

## 2016-02-26 NOTE — Progress Notes (Signed)
MD notified of patient accidentally pulling IV out, OK to leave out. Encourage fluids and salt intake. Will continue to monitor.

## 2016-02-26 NOTE — Progress Notes (Signed)
LCSWA met with patient at bedside.Patient reports he is feeling better today. LCSWA updated patient about his disposition. LCSWA informed patient lcswa spoke to his sister about his disposition. [facility].  [facility] will inform LCSWA when bed is available.    , LCSWA, MSW Clinical Social Worker 5E and Psychiatric Service Line [phone] 02/26/2016  4:54 PM 

## 2016-02-27 LAB — CBC WITH DIFFERENTIAL/PLATELET
BASOS PCT: 1 %
Basophils Absolute: 0 10*3/uL (ref 0.0–0.1)
EOS PCT: 5 %
Eosinophils Absolute: 0.3 10*3/uL (ref 0.0–0.7)
HCT: 36.2 % — ABNORMAL LOW (ref 39.0–52.0)
Hemoglobin: 12.6 g/dL — ABNORMAL LOW (ref 13.0–17.0)
LYMPHS ABS: 1.9 10*3/uL (ref 0.7–4.0)
Lymphocytes Relative: 33 %
MCH: 32.6 pg (ref 26.0–34.0)
MCHC: 34.8 g/dL (ref 30.0–36.0)
MCV: 93.5 fL (ref 78.0–100.0)
MONOS PCT: 11 %
Monocytes Absolute: 0.6 10*3/uL (ref 0.1–1.0)
Neutro Abs: 2.9 10*3/uL (ref 1.7–7.7)
Neutrophils Relative %: 50 %
PLATELETS: 144 10*3/uL — AB (ref 150–400)
RBC: 3.87 MIL/uL — ABNORMAL LOW (ref 4.22–5.81)
RDW: 12.3 % (ref 11.5–15.5)
WBC: 5.7 10*3/uL (ref 4.0–10.5)

## 2016-02-27 LAB — GLUCOSE, CAPILLARY
GLUCOSE-CAPILLARY: 107 mg/dL — AB (ref 65–99)
GLUCOSE-CAPILLARY: 135 mg/dL — AB (ref 65–99)
GLUCOSE-CAPILLARY: 300 mg/dL — AB (ref 65–99)
Glucose-Capillary: 214 mg/dL — ABNORMAL HIGH (ref 65–99)
Glucose-Capillary: 301 mg/dL — ABNORMAL HIGH (ref 65–99)
Glucose-Capillary: 166 mg/dL — ABNORMAL HIGH (ref 65–99)

## 2016-02-27 LAB — COMPREHENSIVE METABOLIC PANEL
ALT: 16 U/L — ABNORMAL LOW (ref 17–63)
ANION GAP: 4 — AB (ref 5–15)
AST: 17 U/L (ref 15–41)
Albumin: 3.2 g/dL — ABNORMAL LOW (ref 3.5–5.0)
Alkaline Phosphatase: 63 U/L (ref 38–126)
BUN: 15 mg/dL (ref 6–20)
CHLORIDE: 101 mmol/L (ref 101–111)
CO2: 32 mmol/L (ref 22–32)
Calcium: 8.7 mg/dL — ABNORMAL LOW (ref 8.9–10.3)
Creatinine, Ser: 0.7 mg/dL (ref 0.61–1.24)
GFR calc non Af Amer: >60 mL/min (ref >60)
Glucose, Bld: 211 mg/dL — ABNORMAL HIGH (ref 65–99)
POTASSIUM: 4.2 mmol/L (ref 3.5–5.1)
SODIUM: 137 mmol/L (ref 135–145)
Total Bilirubin: 0.3 mg/dL (ref 0.3–1.2)
Total Protein: 5.4 g/dL — ABNORMAL LOW (ref 6.5–8.1)

## 2016-02-27 LAB — PHOSPHORUS: PHOSPHORUS: 4.6 mg/dL (ref 2.5–4.6)

## 2016-02-27 LAB — MAGNESIUM: Magnesium: 1.9 mg/dL (ref 1.7–2.4)

## 2016-02-27 MED ORDER — INSULIN ASPART PROT & ASPART (70-30 MIX) 100 UNIT/ML ~~LOC~~ SUSP
26.0000 [IU] | Freq: Every day | SUBCUTANEOUS | Status: DC
  Administered 2016-02-28: 26 [IU] via SUBCUTANEOUS

## 2016-02-27 MED ORDER — METHOCARBAMOL 500 MG PO TABS
500.0000 mg | ORAL_TABLET | Freq: Three times a day (TID) | ORAL | Status: DC | PRN
  Administered 2016-02-27 – 2016-03-03 (×8): 500 mg via ORAL
  Filled 2016-02-27 (×8): qty 1

## 2016-02-27 MED ORDER — INSULIN ASPART PROT & ASPART (70-30 MIX) 100 UNIT/ML ~~LOC~~ SUSP
22.0000 [IU] | Freq: Every day | SUBCUTANEOUS | Status: DC
  Administered 2016-02-27: 22 [IU] via SUBCUTANEOUS

## 2016-02-27 NOTE — Progress Notes (Signed)
LCSWA refaxed patient clinical information to inpatient facilities. Will notify medical staff when bed is available.   Vivi BarrackNicole Meranda Dechaine, Theresia MajorsLCSWA, MSW Clinical Social Worker 5E and Psychiatric Service Line [phone] 02/27/2016  9:40 AM

## 2016-02-27 NOTE — Progress Notes (Signed)
PROGRESS NOTE    Kenneth [name]  ZOX:[id number] DOB: 04/17/1965 DOA: 02/23/2016 PCP: No PCP Per Patient   Brief Narrative:  Kenneth [name] a 50 y.o.malewith history of DM2, hyperthyroidism who presents for hyperglycemia and suicidal thoughts. He has been admitted a few times this year for uncontrolled glucose. He received a match letter to obtain medications this past summer. Presented with a glucose of 777, ketonuria but not acidotic- admitted for concern for developing DKA. Patient improved and now is [facility] for his Suicidal Ideation. Patient's BS was uncontrolled overnight so adjustments have been made. His IV also infiltrated and was removed so patient was encouraged to orally hydrate. Patients BS have been getting better with increased 70/30 and currently awaiting a bed placement for [facility].   Assessment & Plan:   Principal Problem:   Diabetes mellitus with nonketotic hyperosmolarity (HCC) Active Problems:   Dehydration   Hyperthyroidism   Noncompliance with medication regimen   Hyperglycemia  DM1 -Patient's HbA1c went from 11.6 -> 13.2 - Severely uncontrolled due to erratic insulin use mainly due to non-compliance with outpt follow up  - Increased Insulin 70/30 Mix 26 units in AM and 22 Units in PM - C/wAdded back Moderate Novolog SSI - D/C'd IV Fluids - Continue to Monitor CBG's ast they have been ranging from 166-301  Left Sided Pain   - Appears to be neuropathy; Has not improved or worsened.  - C/w Gabapentin 300 mg TID- coupon from goodrx printed and given by [name] yesterday- should be 19 dollars- sister or church goers will need to pay - One Time Dose of Hydrocodne-Acetaminophen yesterday - Started Patient on Robaxin prn today; May try Lyrica if not Improved  Depression - Appears to be Situational-  - Denied intentions to commit suicide to me today - Will go to [facility]patient Psych[facility] Facility pending bed availability -  C/w Fluoxetine 20 mg po Daily started by Psychiatry as well as 50 mg po Trazadone for Insomina  Mild Hyponatremia, improved - IV Fluids D/C'd - Sodium was 137 this AM from 132 - Reassess in AM with BMP  Social Issues- lack of income - Trying to get disability and Medicaid- claims he can't work due to pain - Per Sports coachcase manager, we cannot make and appt and he will need to be seen for "screening for financial eligibility to attend clinic." we are unable to provide more medications for him as he has already received a match letter this year -Will prescribe 70/30 (can get over the counter brand for < $10 at [employer]) to use when he runs out of current insulin. He states he is currently on Levemir and Novolog.  - [facility]  4 mm gallbladder polyp - found on ultrasound which was done due to RUQ pain.  - Outpatient Follow up  DVT prophylaxis: Lovenox sq Code Status: FULL CODE Family Communication: No Family present at bedside Disposition Plan: Inpatient Psychiatric Unit once Bed is Available; MEDICALLY STABLE FOR DISCHARGE  Consultants:   Pyschiatry  Procedures: None  Antimicrobials: None  Subjective: Seen and examined at bedside and states is the same. No new complaints. No N/V but continues to hurt on Left upper side and lower right side.  Objective: Vitals:   02/26/16 1415 02/26/16 2132 02/27/16 0514 02/27/16 1442  BP: 112/83 107/71 94/67 93/61   Pulse: 73 70 60 72  Resp: 16 16 16 18   Temp: 98.4 F (36.9 C)  97.5 F (36.4 C) 97.6 F (36.4 C) 98.7 F (37.1 C)  TempSrc: Oral Oral Oral Oral  SpO2: 99% 100% 97% 100%  Weight:      Height:        Intake/Output Summary (Last 24 hours) at 02/27/16 1728 Last data filed at 02/27/16 1353  Gross per 24 hour  Intake              820 ml  Output                0 ml  Net              820 ml   Filed Weights   02/23/16 1248  Weight: 52.8 kg (116 lb 8 oz)    Examination: Physical  Exam:  Constitutional: WN/WD, NAD and appears calm and comfortable Eyes: Lids and conjunctivae normal, sclerae anicteric  ENMT: External Ears, Nose appear normal. Grossly normal hearing.  Neck: Appears normal, supple, no cervical masses, normal ROM, no appreciable thyromegaly; No JVD Respiratory: Clear to auscultation bilaterally, no wheezing, rales, rhonchi or crackles. Normal respiratory effort and patient is not tachypenic. No accessory muscle use.  Cardiovascular: RRR, no murmurs / rubs / gallops. S1 and S2 auscultated. No extremity edema.  Abdomen: Soft, non-tender, non-distended. No masses palpated. No appreciable hepatosplenomegaly. Bowel sounds positive.  GU: Deferred. Musculoskeletal: No clubbing / cyanosis of digits/nails. No joint deformity upper and lower extremities.  Skin: No rashes, lesions, ulcers. No induration; Warm and dry.  Neurologic: CN 2-12 grossly intact with no focal deficits. Sensation intact in all 4 Extremities. Romberg sign cerebellar reflexes not assessed.  Psychiatric: Normal judgment and insight. Alert and oriented x 3. Depressed mood and flat affect.   Data Reviewed: I have personally reviewed following labs and imaging studies  CBC:  Recent Labs Lab 02/23/16 0930 02/24/16 0359 02/25/16 1809 02/26/16 0345 02/27/16 0356  WBC 9.7 6.8 5.2 6.4 5.7  NEUTROABS  --   --  2.9 4.1 2.9  HGB 14.9 12.6* 12.5* 12.2* 12.6*  HCT 42.0 35.8* 35.4* 34.7* 36.2*  MCV 91.3 91.6 92.2 92.8 93.5  PLT 199 167 130* 133* 144*   Basic Metabolic Panel:  Recent Labs Lab 02/23/16 0930 02/23/16 1758 02/24/16 0359 02/25/16 1809 02/26/16 0345 02/27/16 0356  NA 125*  --  138 136 132* 137  K 5.0  --  3.7 4.1 4.1 4.2  CL 84*  --  107 100* 99* 101  CO2 28  --  27 30 28  32  GLUCOSE 777* 563* 73 235* 404* 211*  BUN 11  --  16 9 10 15   CREATININE 0.98  --  0.68 0.64 0.63 0.70  CALCIUM 9.0  --  8.6* 8.1* 8.0* 8.7*  MG  --   --   --  1.7 1.8 1.9  PHOS  --   --   --  3.1 4.0  4.6   GFR: Estimated Creatinine Clearance: 80.7 mL/min (by C-G formula based on SCr of 0.7 mg/dL). Liver Function Tests:  Recent Labs Lab 02/23/16 1339 02/25/16 1809 02/26/16 0345 02/27/16 0356  AST 11* 20 18 17   ALT 17 16* 15* 16*  ALKPHOS 76 66 61 63  BILITOT 1.1 0.4 0.9 0.3  PROT 6.2* 5.5* 5.2* 5.4*  ALBUMIN 3.7 3.1* 3.0* 3.2*    Recent Labs Lab 02/23/16 1339  LIPASE 21   No results for input(s): AMMONIA in the last 168 hours. Coagulation Profile: No results for input(s): INR, PROTIME in the last 168  hours. Cardiac Enzymes: No results for input(s): CKTOTAL, CKMB, CKMBINDEX, TROPONINI in the last 168 hours. BNP (last 3 results) No results for input(s): PROBNP in the last 8760 hours. HbA1C: No results for input(s): HGBA1C in the last 72 hours. CBG:  Recent Labs Lab 02/27/16 0326 02/27/16 0512 02/27/16 0800 02/27/16 1140 02/27/16 1632  GLUCAP 135* 214* 301* 300* 166*   Lipid Profile: No results for input(s): CHOL, HDL, LDLCALC, TRIG, CHOLHDL, LDLDIRECT in the last 72 hours. Thyroid Function Tests:  Recent Labs  02/25/16 0401  TSH 1.531  FREET4 0.82   Anemia Panel: No results for input(s): VITAMINB12, FOLATE, FERRITIN, TIBC, IRON, RETICCTPCT in the last 72 hours. Sepsis Labs: No results for input(s): PROCALCITON, LATICACIDVEN in the last 168 hours.  No results found for this or any previous visit (from the past 240 hour(s)).   Radiology Studies: No results found. Scheduled Meds: . enoxaparin (LOVENOX) injection  40 mg Subcutaneous Q24H  . feeding supplement (GLUCERNA SHAKE)  237 mL Oral TID BM  . FLUoxetine  20 mg Oral Daily  . folic acid  1 mg Oral Daily  . gabapentin  400 mg Oral TID  . insulin aspart  0-15 Units Subcutaneous TID WC  . insulin aspart protamine- aspart  22 Units Subcutaneous Q supper  . [START ON 02/28/2016] insulin aspart protamine- aspart  26 Units Subcutaneous Q breakfast  . multivitamin with minerals  1 tablet Oral Daily  .  sodium chloride  1,000 mL Intravenous Once  . thiamine  100 mg Oral Daily  . traZODone  50 mg Oral QHS   Continuous Infusions:   LOS: 3 days   Merlene Laughter, DO [facility] Pager [phone]  If 7PM-7AM, please contact night-coverage [email or web address] Password [facility] 02/27/2016, 5:28 PM

## 2016-02-27 NOTE — Progress Notes (Signed)
Nursing Note: Pt states he "feels like his sugar is high." A: Checked cbg and pt was 106.Pt then c/o feeling hungry all the time.wbb

## 2016-02-27 NOTE — Progress Notes (Signed)
Inpatient Diabetes Program Recommendations  AACE/ADA: New Consensus Statement on Inpatient Glycemic Control (2015)  Target Ranges:  Prepandial:   less than 140 mg/dL      Peak postprandial:   less than 180 mg/dL (1-2 hours)      Critically ill patients:  140 - 180 mg/dL   Results for Kenneth [name], Kenneth [name] (MRN [id number]) as of 02/27/2016 12:49  Ref. Range 02/27/2016 03:26 02/27/2016 05:12 02/27/2016 08:00 02/27/2016 11:40  Glucose-Capillary Latest Ref Range: 65 - 99 mg/dL 119135 (H) 147214 (H) 829301 (H) 300 (H)   Review of Glycemic Control  Diabetes history: DM Outpatient Diabetes medications: 70/30 unknown dose, based on how he feels, relies on friends insulin Current orders for Inpatient glycemic control: 70/30 20 units BID, Novolog Moderate Correction + Novolog HS scale  Inpatient Diabetes Program Recommendations:   Patient still with hyperglycemia 300's today after increase in 70/30 to 20 units BID. Please consider increasing the morning dose of 70/30 insulin to 26-28 units QAM and give 70/30 22 units QPM.  Thanks,  Christena DeemShannon Lamyah Creed RN, MSN, [facility] Inpatient Diabetes Coordinator Team Pager [phone] (8a-5p)

## 2016-02-28 DIAGNOSIS — E875 Hyperkalemia: Secondary | ICD-10-CM

## 2016-02-28 LAB — CBC WITH DIFFERENTIAL/PLATELET
Basophils Absolute: 0 10*3/uL (ref 0.0–0.1)
Basophils Relative: 1 %
EOS PCT: 4 %
Eosinophils Absolute: 0.2 10*3/uL (ref 0.0–0.7)
HCT: 39.2 % (ref 39.0–52.0)
HEMOGLOBIN: 13.2 g/dL (ref 13.0–17.0)
LYMPHS ABS: 1.4 10*3/uL (ref 0.7–4.0)
LYMPHS PCT: 24 %
MCH: 32 pg (ref 26.0–34.0)
MCHC: 33.7 g/dL (ref 30.0–36.0)
MCV: 95.1 fL (ref 78.0–100.0)
MONOS PCT: 8 %
Monocytes Absolute: 0.5 10*3/uL (ref 0.1–1.0)
NEUTROS PCT: 63 %
Neutro Abs: 3.5 10*3/uL (ref 1.7–7.7)
Platelets: 157 10*3/uL (ref 150–400)
RBC: 4.12 MIL/uL — AB (ref 4.22–5.81)
RDW: 12.4 % (ref 11.5–15.5)
WBC: 5.6 10*3/uL (ref 4.0–10.5)

## 2016-02-28 LAB — COMPREHENSIVE METABOLIC PANEL
ALK PHOS: 59 U/L (ref 38–126)
ALT: 20 U/L (ref 17–63)
ANION GAP: 7 (ref 5–15)
AST: 24 U/L (ref 15–41)
Albumin: 3.4 g/dL — ABNORMAL LOW (ref 3.5–5.0)
BUN: 16 mg/dL (ref 6–20)
CALCIUM: 8.7 mg/dL — AB (ref 8.9–10.3)
CO2: 30 mmol/L (ref 22–32)
Chloride: 92 mmol/L — ABNORMAL LOW (ref 101–111)
Creatinine, Ser: 0.81 mg/dL (ref 0.61–1.24)
GFR calc Af Amer: >60 mL/min (ref >60)
Glucose, Bld: 579 mg/dL (ref 65–99)
Potassium: 6.6 mmol/L (ref 3.5–5.1)
Sodium: 129 mmol/L — ABNORMAL LOW (ref 135–145)
TOTAL PROTEIN: 6.2 g/dL — AB (ref 6.5–8.1)
Total Bilirubin: 0.6 mg/dL (ref 0.3–1.2)

## 2016-02-28 LAB — GLUCOSE, CAPILLARY
GLUCOSE-CAPILLARY: 357 mg/dL — AB (ref 65–99)
GLUCOSE-CAPILLARY: 60 mg/dL — AB (ref 65–99)
GLUCOSE-CAPILLARY: 119 mg/dL — AB (ref 65–99)
GLUCOSE-CAPILLARY: 199 mg/dL — AB (ref 65–99)
Glucose-Capillary: 259 mg/dL — ABNORMAL HIGH (ref 65–99)

## 2016-02-28 LAB — POTASSIUM
Potassium: 3.7 mmol/L (ref 3.5–5.1)
Potassium: 4.3 mmol/L (ref 3.5–5.1)
Potassium: 3.7 mmol/L (ref 3.5–5.1)

## 2016-02-28 LAB — CREATININE, URINE, RANDOM: Creatinine, Urine: 23.9 mg/dL

## 2016-02-28 LAB — OSMOLALITY, URINE: Osmolality, Ur: 587 mOsm/kg (ref 300–900)

## 2016-02-28 LAB — PHOSPHORUS: PHOSPHORUS: 4.2 mg/dL (ref 2.5–4.6)

## 2016-02-28 LAB — SODIUM, URINE, RANDOM: Sodium, Ur: 80 mmol/L

## 2016-02-28 LAB — MAGNESIUM: MAGNESIUM: 1.6 mg/dL — AB (ref 1.7–2.4)

## 2016-02-28 MED ORDER — SODIUM BICARBONATE 8.4 % IV SOLN
50.0000 meq | Freq: Once | INTRAVENOUS | Status: AC
Start: 2016-02-28 — End: 2016-02-28
  Administered 2016-02-28: 50 meq via INTRAVENOUS
  Filled 2016-02-28: qty 50

## 2016-02-28 MED ORDER — SODIUM CHLORIDE 0.9 % IV SOLN
INTRAVENOUS | Status: DC
  Administered 2016-02-28 – 2016-03-03 (×8): via INTRAVENOUS

## 2016-02-28 MED ORDER — SODIUM CHLORIDE 0.9 % IV BOLUS (SEPSIS)
1000.0000 mL | Freq: Once | INTRAVENOUS | Status: AC
  Administered 2016-02-28: 1000 mL via INTRAVENOUS

## 2016-02-28 MED ORDER — FUROSEMIDE 10 MG/ML IJ SOLN
40.0000 mg | Freq: Once | INTRAMUSCULAR | Status: AC
  Administered 2016-02-28: 40 mg via INTRAVENOUS
  Filled 2016-02-28: qty 4

## 2016-02-28 MED ORDER — INSULIN ASPART PROT & ASPART (70-30 MIX) 100 UNIT/ML ~~LOC~~ SUSP: 16.0000 [IU] | Freq: Every day | SUBCUTANEOUS | Status: DC

## 2016-02-28 MED ORDER — INSULIN ASPART PROT & ASPART (70-30 MIX) 100 UNIT/ML ~~LOC~~ SUSP
26.0000 [IU] | Freq: Every day | SUBCUTANEOUS | Status: DC
  Administered 2016-02-29: 26 [IU] via SUBCUTANEOUS

## 2016-02-28 MED ORDER — INSULIN ASPART 100 UNIT/ML IV SOLN
10.0000 [IU] | Freq: Once | INTRAVENOUS | Status: AC
  Administered 2016-02-28: 10 [IU] via INTRAVENOUS

## 2016-02-28 MED ORDER — INSULIN ASPART PROT & ASPART (70-30 MIX) 100 UNIT/ML ~~LOC~~ SUSP: 22.0000 [IU] | Freq: Every day | SUBCUTANEOUS | Status: DC

## 2016-02-28 MED ORDER — INSULIN ASPART PROT & ASPART (70-30 MIX) 100 UNIT/ML ~~LOC~~ SUSP
16.0000 [IU] | Freq: Once | SUBCUTANEOUS | Status: AC
  Administered 2016-02-28: 16 [IU] via SUBCUTANEOUS

## 2016-02-28 MED ORDER — SODIUM CHLORIDE 0.9 % IV SOLN
1.0000 g | Freq: Once | INTRAVENOUS | Status: AC
  Administered 2016-02-28: 1 g via INTRAVENOUS
  Filled 2016-02-28: qty 10

## 2016-02-28 NOTE — Progress Notes (Signed)
Received transfer from 3W.  [place].  Pt is stable and I agree with previous RN assessment.  Will continue with plan of care.

## 2016-02-28 NOTE — Progress Notes (Signed)
Hypoglycemic Event  CBG: 60  Treatment: 15 GM carbohydrate snack  Symptoms: Hungry  Follow-up CBG: Time:1734 CBG Result:119  Possible Reasons for Event: insulin   Comments/MD notified: MD notified     Buel ReamMartin, Miranda Brennyn Ortlieb

## 2016-02-28 NOTE — Progress Notes (Signed)
Assumed care of patient at . Agree with previous Nurse assessment.  Monigue Spraggins M. Yoskar Murrillo, RN  

## 2016-02-28 NOTE — Progress Notes (Signed)
CSW [facility] In[facility] Fermoina[facility] see if [facility] are able [facility] patient today. CSW awaiting call back.    [name] MaxinKelly Rogen Porte, LCSW [facility] Clinical Social Worker cell #: [phone]

## 2016-02-28 NOTE — Progress Notes (Signed)
[facility].  Melbourne Abtsatia Rainbow Salman, LCSWA Disposition staff 02/28/2016 11:10 AM

## 2016-02-28 NOTE — Progress Notes (Signed)
CRITICAL VALUE ALERT  Critical value received:  Potassium 6.6; Glucose 579  Date of notification:  02/28/2016   Time of notification:  0950  Critical value read back:Yes.    Nurse who received alert:  Charlynne Panderegina Briggs Edelen, RN   MD notified (1st page):  Sheikh  Time of first page:  [phone]  MD notified (2nd page):  Time of second page:  Responding MD:  Marland McalpineSheikh  Time MD responded:  [phone] -- new orders placed for cardiac monitoring, IVFs, labs, etc.

## 2016-02-28 NOTE — Plan of Care (Signed)
Problem: Nutrition: Goal: Adequate nutrition will be maintained Outcome: Progressing Pt with fair appetite, fair PO intake.  Glucerna given.

## 2016-02-28 NOTE — Progress Notes (Signed)
[facility]. Will transfer patient via wheelchair.

## 2016-02-28 NOTE — Progress Notes (Signed)
PROGRESS NOTE    Kenneth [name]  ZOX:[id number] DOB: 02/02/1966 DOA: 02/23/2016 PCP: No PCP Per Patient   Brief Narrative:  Kenneth Crockerandy C Vernonis a 50 y.o.malewith history of DM2, hyperthyroidism who presents for hyperglycemia and suicidal thoughts. He has been admitted a few times this year for uncontrolled glucose. He received a match letter to obtain medications this past summer. Presented with a glucose of 777, ketonuria but not acidotic- admitted for concern for developing DKA. Patient improved and now [facility] Su[facility] Ideation. Patient's BS was uncontrolled overnight so adjustments have been made. His IV also infiltrated and was removed so patient was encouraged to orally hydrate. Patients BS have been getting better with increased 70/30 and currently awaiting a bed placement for [facility]. Today patient's BS was severely elevated and Patient was Hyperkalemic. So Hyperkalemia order set enacted and patient transferred to Telemetry.   Assessment & Plan:   Principal Problem:   Diabetes mellitus with nonketotic hyperosmolarity (HCC) Active Problems:   Dehydration   Hyperthyroidism   Noncompliance with medication regimen   Hyperglycemia  DM1 -Patient's HbA1c went from 11.6 -> 13.2 - Severely uncontrolled due to erratic insulin use mainly due to non-compliance with outpt follow up  - Increased Insulin 70/30 Mix 26 units in AM and 22 Units in PM - C/wAdded back Moderate Novolog SSI - Restarted IV Fluids - Continue to Monitor CBG's; Patients BS this AM was 579 - Restarted IV - Started Novolog Insulin 10 units, IVF, Sodium Bicarb and Calcium Gluconate; Bolused a Liter - Episodes of Hyper and Hypokalemia- Continue to Monitor Closely. Will give 16 units in PM  Hyperkalemia -Potassium was 6.6  -Gave 1 dose of IV 40 mg of Lasix -EKG did not show Tented T waves.    -Started Novolog Insulin 10 units, IVF, Sodium Bicarb and Calcium Gluconate  -Repeat was 3.7 and improved after Interventions.   Left Sided Pain   - Appears to be neuropathy; Has not improved or worsened.  - C/w Gabapentin 300 mg TID- coupon from goodrx printed and given by [name] yesterday- should be 19 dollars- sister or church goers will need to pay - One Time Dose of Hydrocodne-Acetaminophen yesterday - Started Patient on Robaxin prn today; May try Lyrica if not Improved  Depression - Appears to be Situational-  - Denied intentions to commit suicide to me today - Will go to [facility]patient Psych[facility] Facility pending bed availability - C/w Fluoxetine 20 mg po Daily started by Psychiatry as well as 50 mg po Trazadone for Insomina  Mild Hyponatremia, improved - Restarted IVF - Sodium was 129 this AM from 137 - Reassess in AM with BMP  Social Issues- lack of income - Trying to get disability and Medicaid- claims he can't work due to pain - Per Sports coachcase manager, we cannot make and appt and he will need to be seen for "screening for financial eligibility to attend clinic." we are unable to provide more medications for him as he has already received a match letter this year -Will prescribe 70/30 (can get over the counter brand for < $10 at [employer]) to use when he runs out of current insulin. He states he is currently on Levemir and Novolog.  - [facility]  4 mm gallbladder polyp - found on ultrasound which was done due to RUQ pain.  - Outpatient Follow up  DVT prophylaxis: Lovenox sq Code Status: FULL CODE Family  Communication: No Family present at bedside Disposition Plan: Inpatient Psychiatric Unit once Bed is Available and Diabetes Under Control  Consultants:   Pyschiatry  Procedures: None  Antimicrobials: None  Subjective: Seen and examined at bedside and was frustrated that his BS kept going out of control. No N/V. Complained of chronic Pain again. No other concerns or complaints at this time.    Objective: Vitals:   02/27/16 1442 02/27/16 2038 02/28/16 0530 02/28/16 1220  BP: 93/61 108/78 94/60 116/82  Pulse: 72 66 (!) 58 62  Resp: 18 16 18 19   Temp: 98.7 F (37.1 C) 98.9 F (37.2 C) 97.9 F (36.6 C)   TempSrc: Oral Oral Oral Oral  SpO2: 100% 99% 98% 99%  Weight:    55.1 kg (121 lb 7.6 oz)  Height:    5\' 9"  (1.753 m)    Intake/Output Summary (Last 24 hours) at 02/28/16 1304 Last data filed at 02/28/16 1210  Gross per 24 hour  Intake             1642 ml  Output              600 ml  Net             1042 ml   Filed Weights   02/23/16 1248 02/28/16 1220  Weight: 52.8 kg (116 lb 8 oz) 55.1 kg (121 lb 7.6 oz)    Examination: Physical Exam:  Constitutional: WN/WD, NAD and appears calm and comfortable Eyes: Lids and conjunctivae normal, sclerae anicteric  ENMT: External Ears, Nose appear normal. Grossly normal hearing.  Neck: Appears normal, supple, no cervical masses, normal ROM, no appreciable thyromegaly; No JVD Respiratory: Clear to auscultation bilaterally, no wheezing, rales, rhonchi or crackles. Normal respiratory effort and patient is not tachypenic. No accessory muscle use.  Cardiovascular: RRR, no murmurs / rubs / gallops. S1 and S2 auscultated. No extremity edema.  Abdomen: Soft, non-tender, non-distended. No masses palpated. No appreciable hepatosplenomegaly. Bowel sounds positive.  GU: Deferred. Musculoskeletal: No clubbing / cyanosis of digits/nails. No joint deformity upper and lower extremities.  Skin: No rashes, lesions, ulcers. No induration; Warm and dry.  Neurologic: CN 2-12 grossly intact with no focal deficits. Sensation intact in all 4 Extremities. Romberg sign cerebellar reflexes not assessed.  Psychiatric: Normal judgment and insight. Alert and oriented x 3. Depressed mood and flat affect.   Data Reviewed: I have personally reviewed following labs and imaging studies  CBC:  Recent Labs Lab 02/24/16 0359 02/25/16 1809 02/26/16 0345  02/27/16 0356 02/28/16 0840  WBC 6.8 5.2 6.4 5.7 5.6  NEUTROABS  --  2.9 4.1 2.9 3.5  HGB 12.6* 12.5* 12.2* 12.6* 13.2  HCT 35.8* 35.4* 34.7* 36.2* 39.2  MCV 91.6 92.2 92.8 93.5 95.1  PLT 167 130* 133* 144* 157   Basic Metabolic Panel:  Recent Labs Lab 02/24/16 0359 02/25/16 1809 02/26/16 0345 02/27/16 0356 02/28/16 0840  NA 138 136 132* 137 129*  K 3.7 4.1 4.1 4.2 6.6*  CL 107 100* 99* 101 92*  CO2 27 30 28  32 30  GLUCOSE 73 235* 404* 211* 579*  BUN 16 9 10 15 16   CREATININE 0.68 0.64 0.63 0.70 0.81  CALCIUM 8.6* 8.1* 8.0* 8.7* 8.7*  MG  --  1.7 1.8 1.9 1.6*  PHOS  --  3.1 4.0 4.6 4.2   GFR: Estimated Creatinine Clearance: 83.1 mL/min (by C-G formula based on SCr of 0.81 mg/dL). Liver Function Tests:  Recent Labs Lab 02/23/16 1339 02/25/16 1809  02/26/16 0345 02/27/16 0356 02/28/16 0840  AST 11* 20 18 17 24   ALT 17 16* 15* 16* 20  ALKPHOS 76 66 61 63 59  BILITOT 1.1 0.4 0.9 0.3 0.6  PROT 6.2* 5.5* 5.2* 5.4* 6.2*  ALBUMIN 3.7 3.1* 3.0* 3.2* 3.4*    Recent Labs Lab 02/23/16 1339  LIPASE 21   No results for input(s): AMMONIA in the last 168 hours. Coagulation Profile: No results for input(s): INR, PROTIME in the last 168 hours. Cardiac Enzymes: No results for input(s): CKTOTAL, CKMB, CKMBINDEX, TROPONINI in the last 168 hours. BNP (last 3 results) No results for input(s): PROBNP in the last 8760 hours. HbA1C: No results for input(s): HGBA1C in the last 72 hours. CBG:  Recent Labs Lab 02/27/16 0800 02/27/16 1140 02/27/16 1632 02/28/16 0727 02/28/16 1225  GLUCAP 301* 300* 166* 357* 259*   Lipid Profile: No results for input(s): CHOL, HDL, LDLCALC, TRIG, CHOLHDL, LDLDIRECT in the last 72 hours. Thyroid Function Tests: No results for input(s): TSH, T4TOTAL, FREET4, T3FREE, THYROIDAB in the last 72 hours. Anemia Panel: No results for input(s): VITAMINB12, FOLATE, FERRITIN, TIBC, IRON, RETICCTPCT in the last 72 hours. Sepsis Labs: No results for  input(s): PROCALCITON, LATICACIDVEN in the last 168 hours.  No results found for this or any previous visit (from the past 240 hour(s)).   Radiology Studies: No results found. Scheduled Meds: . enoxaparin (LOVENOX) injection  40 mg Subcutaneous Q24H  . feeding supplement (GLUCERNA SHAKE)  237 mL Oral TID BM  . FLUoxetine  20 mg Oral Daily  . folic acid  1 mg Oral Daily  . gabapentin  400 mg Oral TID  . insulin aspart  0-15 Units Subcutaneous TID WC  . insulin aspart protamine- aspart  22 Units Subcutaneous Q supper  . insulin aspart protamine- aspart  26 Units Subcutaneous Q breakfast  . multivitamin with minerals  1 tablet Oral Daily  . sodium chloride  1,000 mL Intravenous Once  . thiamine  100 mg Oral Daily  . traZODone  50 mg Oral QHS   Continuous Infusions: . sodium chloride       LOS: 4 days   Merlene Laughtermair Latif Dagoberto Nealy, DO [facility] Pager [phone]  If 7PM-7AM, please contact night-coverage [email or web address] Password [email or web address] 02/28/2016, 1:04 PM

## 2016-02-29 ENCOUNTER — Inpatient Hospital Stay (HOSPITAL_COMMUNITY): Payer: Medicaid Other

## 2016-02-29 DIAGNOSIS — F332 Major depressive disorder, recurrent severe without psychotic features: Secondary | ICD-10-CM

## 2016-02-29 DIAGNOSIS — R1031 Right lower quadrant pain: Secondary | ICD-10-CM

## 2016-02-29 LAB — COMPREHENSIVE METABOLIC PANEL
ALBUMIN: 3.1 g/dL — AB (ref 3.5–5.0)
ALK PHOS: 53 U/L (ref 38–126)
ALT: 19 U/L (ref 17–63)
ANION GAP: 6 (ref 5–15)
AST: 26 U/L (ref 15–41)
BUN: 15 mg/dL (ref 6–20)
CALCIUM: 8.5 mg/dL — AB (ref 8.9–10.3)
CO2: 31 mmol/L (ref 22–32)
Chloride: 100 mmol/L — ABNORMAL LOW (ref 101–111)
Creatinine, Ser: 0.65 mg/dL (ref 0.61–1.24)
GFR calc Af Amer: >60 mL/min (ref >60)
GFR calc non Af Amer: >60 mL/min (ref >60)
GLUCOSE: 149 mg/dL — AB (ref 65–99)
POTASSIUM: 4.6 mmol/L (ref 3.5–5.1)
SODIUM: 137 mmol/L (ref 135–145)
Total Bilirubin: 0.2 mg/dL — ABNORMAL LOW (ref 0.3–1.2)
Total Protein: 5.7 g/dL — ABNORMAL LOW (ref 6.5–8.1)

## 2016-02-29 LAB — CBC WITH DIFFERENTIAL/PLATELET
BASOS PCT: 0 %
Basophils Absolute: 0 10*3/uL (ref 0.0–0.1)
EOS ABS: 0.3 10*3/uL (ref 0.0–0.7)
Eosinophils Relative: 5 %
HCT: 36.2 % — ABNORMAL LOW (ref 39.0–52.0)
HEMOGLOBIN: 12.3 g/dL — AB (ref 13.0–17.0)
Lymphocytes Relative: 38 %
Lymphs Abs: 2.3 10*3/uL (ref 0.7–4.0)
MCH: 32.1 pg (ref 26.0–34.0)
MCHC: 34 g/dL (ref 30.0–36.0)
MCV: 94.5 fL (ref 78.0–100.0)
MONOS PCT: 11 %
Monocytes Absolute: 0.7 10*3/uL (ref 0.1–1.0)
NEUTROS PCT: 46 %
Neutro Abs: 2.7 10*3/uL (ref 1.7–7.7)
Platelets: 175 10*3/uL (ref 150–400)
RBC: 3.83 MIL/uL — ABNORMAL LOW (ref 4.22–5.81)
RDW: 12.7 % (ref 11.5–15.5)
WBC: 5.9 10*3/uL (ref 4.0–10.5)

## 2016-02-29 LAB — GLUCOSE, CAPILLARY
GLUCOSE-CAPILLARY: 126 mg/dL — AB (ref 65–99)
GLUCOSE-CAPILLARY: 171 mg/dL — AB (ref 65–99)
Glucose-Capillary: 124 mg/dL — ABNORMAL HIGH (ref 65–99)
Glucose-Capillary: 146 mg/dL — ABNORMAL HIGH (ref 65–99)
Glucose-Capillary: 79 mg/dL (ref 65–99)
Glucose-Capillary: 393 mg/dL — ABNORMAL HIGH (ref 65–99)

## 2016-02-29 LAB — PHOSPHORUS: Phosphorus: 3.7 mg/dL (ref 2.5–4.6)

## 2016-02-29 LAB — POTASSIUM
POTASSIUM: 3.9 mmol/L (ref 3.5–5.1)
POTASSIUM: 4.6 mmol/L (ref 3.5–5.1)
POTASSIUM: 4 mmol/L (ref 3.5–5.1)
POTASSIUM: 4.6 mmol/L (ref 3.5–5.1)
POTASSIUM: 5.9 mmol/L — AB (ref 3.5–5.1)

## 2016-02-29 LAB — MAGNESIUM: Magnesium: 1.8 mg/dL (ref 1.7–2.4)

## 2016-02-29 MED ORDER — IOPAMIDOL (ISOVUE-300) INJECTION 61%
30.0000 mL | Freq: Once | INTRAVENOUS | Status: AC
  Administered 2016-02-29: 30 mL via ORAL

## 2016-02-29 MED ORDER — SODIUM CHLORIDE 0.9 % IV BOLUS (SEPSIS)
1000.0000 mL | Freq: Once | INTRAVENOUS | Status: AC
  Administered 2016-02-29: 1000 mL via INTRAVENOUS

## 2016-02-29 MED ORDER — INSULIN ASPART 100 UNIT/ML ~~LOC~~ SOLN
4.0000 [IU] | Freq: Once | SUBCUTANEOUS | Status: AC
  Administered 2016-02-29: 4 [IU] via SUBCUTANEOUS

## 2016-02-29 MED ORDER — BISACODYL 5 MG PO TBEC
10.0000 mg | DELAYED_RELEASE_TABLET | Freq: Once | ORAL | Status: AC
Start: 2016-02-29 — End: 2016-02-29
  Administered 2016-02-29: 10 mg via ORAL
  Filled 2016-02-29: qty 2

## 2016-02-29 MED ORDER — PREGABALIN 50 MG PO CAPS
50.0000 mg | ORAL_CAPSULE | Freq: Three times a day (TID) | ORAL | Status: DC
  Administered 2016-02-29 – 2016-03-03 (×11): 50 mg via ORAL
  Filled 2016-02-29 (×11): qty 1

## 2016-02-29 MED ORDER — INSULIN ASPART PROT & ASPART (70-30 MIX) 100 UNIT/ML ~~LOC~~ SUSP
24.0000 [IU] | Freq: Every day | SUBCUTANEOUS | Status: DC
  Administered 2016-03-01 – 2016-03-03 (×3): 24 [IU] via SUBCUTANEOUS

## 2016-02-29 MED ORDER — INSULIN ASPART PROT & ASPART (70-30 MIX) 100 UNIT/ML ~~LOC~~ SUSP: 16.0000 [IU] | Freq: Every day | SUBCUTANEOUS | Status: DC

## 2016-02-29 MED ORDER — IOPAMIDOL (ISOVUE-300) INJECTION 61%
INTRAVENOUS | Status: AC
  Filled 2016-02-29: qty 100

## 2016-02-29 MED ORDER — DOCUSATE SODIUM 100 MG PO CAPS
200.0000 mg | ORAL_CAPSULE | Freq: Two times a day (BID) | ORAL | Status: DC
  Administered 2016-02-29 – 2016-03-03 (×6): 200 mg via ORAL
  Filled 2016-02-29 (×6): qty 2

## 2016-02-29 MED ORDER — IOPAMIDOL (ISOVUE-300) INJECTION 61%
100.0000 mL | Freq: Once | INTRAVENOUS | Status: AC | PRN
  Administered 2016-02-29: 100 mL via INTRAVENOUS

## 2016-02-29 MED ORDER — IOPAMIDOL (ISOVUE-300) INJECTION 61%
INTRAVENOUS | Status: AC
  Filled 2016-02-29: qty 30

## 2016-02-29 NOTE — Progress Notes (Signed)
PROGRESS NOTE    Kenneth [name]  XBJ:[id number] DOB: 02/02/1966 DOA: 02/23/2016 PCP: No PCP Per Patient   Brief Narrative:  Kenneth [name] a 50 y.o.malewith history of DM2, hyperthyroidism who presents for hyperglycemia and suicidal thoughts. He has been admitted a few times this year for uncontrolled glucose. He received a match letter to obtain medications this past summer. Presented with a glucose of 777, ketonuria but not acidotic- admitted for concern for developing DKA. Patient improved and now [facility] Su[facility] Ide[facility]. Patient's BS was uncontrolled overnight so adjustments have been made. His IV also infiltrated and was removed so patient was encouraged to orally hydrate. Patients BS have been getting better with increased 70/30 and currently awaiting a bed placement for [facility]. Today patient's BS was severely elevated and Patient was Hyperkalemic. So Hyperkalemia order set enacted and patient transferred to Telemetry.   Assessment & Plan:   Principal Problem:   Diabetes mellitus with nonketotic hyperosmolarity (HCC) Active Problems:   Dehydration   Hyperthyroidism   Noncompliance with medication regimen   Hyperglycemia  DM1 -Patient's HbA1c went from 11.6 -> 13.2 - Severely uncontrolled due to erratic insulin use mainly due to non-compliance with outpt follow up  - Increased Insulin 70/30 Mix 26 units in AM and 22 Units in PM - C/w Moderate Novolog SSI - Restarted IV Fluids - Continue to Monitor CBG's; Patients BS this AM was improved and CBG's ranged from 124-199 - PER Nurse Patient was eating multiple sandwiches, ice cream and meals even though he is on a Carb Modified Diet; Likely explanations of his BS varying so much.   Hyperkalemia, resolved -Potassium was 6.6  -Gave 1 dose of IV 40 mg of Lasix -EKG did not show Tented T waves.    -Started Novolog Insulin 10 units, IVF, Sodium Bicarb and Calcium Gluconate   -Repeat  was 3.7 and improved after Interventions.     Mild Hypotension    - Blood Pressure this AM was 80/54    - Gave 1 Liter NS Bolus    - Continue to Monitor BP  Left Sided Pain   - Appears to be neuropathy; Has not improved or worsened.  - Changed Gabapentin 300 mg TID to Lyrica - Started Patient on Robaxin prn   Depression - Appears to be Situational-  - Does not admit to being Suicidal today - [facility] to [facility] pending bed availability - C/w Fluoxetine 20 mg po Daily started by Psychiatry as well as 50 mg po Trazadone for Insomina  Mild Hyponatremia, improved *could have been Psuedohyponatremia from Hyperglycemia - Restarted IVF - Sodium improved to 137 - Reassess in AM with BMP  Social Issues- lack of income - Trying to get disability and Medicaid- claims he can't work due to pain - Per Sports coachcase manager, we cannot make and appt and he will need to be seen for "screening for financial eligibility to attend clinic." we are unable to provide more medications for him as he has already received a match letter this year -Will prescribe 70/30 (can get over the counter brand for < $10 at [employer]) to use when he runs out of current insulin. He states he is currently on Levemir and Novolog.  - [facility]  4 mm gallbladder polyp - found on ultrasound which was done due to RUQ pain.  - Outpatient Follow up  Right Lower Abdominal Pain -Will  obtain CT Abd/Pelvis with Contrast  DVT prophylaxis: Lovenox sq Code Status: FULL CODE Family Communication: No Family present at bedside Disposition Plan: Inpatient Psychiatric Unit once Bed is Available and Diabetes Under Control  Consultants:   Pyschiatry  Procedures: CT of the Abdomen and Pelvis  Antimicrobials: None  Subjective: Seen and examined at bedside and was complaining of Abdominal Pain still. States left sided pain was better but legs were painful to touch. States he was  "starved" last night but per nurse patient was ordering multiple meals and eating significant amount of Ice Cream. No other concerns or complaints at this time.   Objective: Vitals:   02/28/16 1220 02/28/16 1400 02/28/16 2114 02/29/16 0532  BP: 116/82 99/60 106/80 (!) 80/54  Pulse: 62 72 66 (!) 59  Resp: 19 18 18 16   Temp:  98.6 F (37 C) 98 F (36.7 C) 98.3 F (36.8 C)  TempSrc: Oral Oral Oral Oral  SpO2: 99% 99% 100% 99%  Weight: 55.1 kg (121 lb 7.6 oz)     Height: 5\' 9"  (1.753 m)       Intake/Output Summary (Last 24 hours) at 02/29/16 1114 Last data filed at 02/29/16 1006  Gross per 24 hour  Intake          5420.33 ml  Output             5200 ml  Net           220.33 ml   Filed Weights   02/23/16 1248 02/28/16 1220  Weight: 52.8 kg (116 lb 8 oz) 55.1 kg (121 lb 7.6 oz)    Examination: Physical Exam:  Constitutional: WN/WD, NAD and appears calm and comfortable Eyes: Lids and conjunctivae normal, sclerae anicteric  ENMT: External Ears, Nose appear normal. Grossly normal hearing.  Neck: Appears normal, supple, no cervical masses, normal ROM, no appreciable thyromegaly; No JVD Respiratory: Clear to auscultation bilaterally, no wheezing, rales, rhonchi or crackles. Normal respiratory effort and patient is not tachypenic. No accessory muscle use.  Cardiovascular: RRR, no murmurs / rubs / gallops. S1 and S2 auscultated. No extremity edema.  Abdomen: Soft, tender in RLQ, Non-Distended. No masses palpated. No appreciable hepatosplenomegaly. Bowel sounds positive.  GU: Deferred. Musculoskeletal: No clubbing / cyanosis of digits/nails. No joint deformity upper and lower extremities.  Skin: No rashes, lesions, ulcers. No induration; Warm and dry.  Neurologic: CN 2-12 grossly intact with no focal deficits. Sensation intact in all 4 Extremities. Romberg sign cerebellar reflexes not assessed.  Psychiatric: Normal judgment and insight. Alert and oriented x 3. Depressed mood and flat  affect.   Data Reviewed: I have personally reviewed following labs and imaging studies  CBC:  Recent Labs Lab 02/25/16 1809 02/26/16 0345 02/27/16 0356 02/28/16 0840 02/29/16 0547  WBC 5.2 6.4 5.7 5.6 5.9  NEUTROABS 2.9 4.1 2.9 3.5 2.7  HGB 12.5* 12.2* 12.6* 13.2 12.3*  HCT 35.4* 34.7* 36.2* 39.2 36.2*  MCV 92.2 92.8 93.5 95.1 94.5  PLT 130* 133* 144* 157 175   Basic Metabolic Panel:  Recent Labs Lab 02/25/16 1809 02/26/16 0345 02/27/16 0356 02/28/16 0840  02/28/16 1835 02/28/16 2148 02/29/16 0139 02/29/16 0547 02/29/16 0920  NA 136 132* 137 129*  --   --   --   --  137  --   K 4.1 4.1 4.2 6.6*  < > 4.3 3.7 3.9 4.6 4.6  CL 100* 99* 101 92*  --   --   --   --  100*  --  CO2 30 28 32 30  --   --   --   --  31  --   GLUCOSE 235* 404* 211* 579*  --   --   --   --  149*  --   BUN 9 10 15 16   --   --   --   --  15  --   CREATININE 0.64 0.63 0.70 0.81  --   --   --   --  0.65  --   CALCIUM 8.1* 8.0* 8.7* 8.7*  --   --   --   --  8.5*  --   MG 1.7 1.8 1.9 1.6*  --   --   --   --  1.8  --   PHOS 3.1 4.0 4.6 4.2  --   --   --   --  3.7  --   < > = values in this interval not displayed. GFR: Estimated Creatinine Clearance: 84.2 mL/min (by C-G formula based on SCr of 0.65 mg/dL). Liver Function Tests:  Recent Labs Lab 02/25/16 1809 02/26/16 0345 02/27/16 0356 02/28/16 0840 02/29/16 0547  AST 20 18 17 24 26   ALT 16* 15* 16* 20 19  ALKPHOS 66 61 63 59 53  BILITOT 0.4 0.9 0.3 0.6 0.2*  PROT 5.5* 5.2* 5.4* 6.2* 5.7*  ALBUMIN 3.1* 3.0* 3.2* 3.4* 3.1*    Recent Labs Lab 02/23/16 1339  LIPASE 21   No results for input(s): AMMONIA in the last 168 hours. Coagulation Profile: No results for input(s): INR, PROTIME in the last 168 hours. Cardiac Enzymes: No results for input(s): CKTOTAL, CKMB, CKMBINDEX, TROPONINI in the last 168 hours. BNP (last 3 results) No results for input(s): PROBNP in the last 8760 hours. HbA1C: No results for input(s): HGBA1C in the last  72 hours. CBG:  Recent Labs Lab 02/28/16 1734 02/28/16 2014 02/29/16 0035 02/29/16 0507 02/29/16 0726  GLUCAP 119* 199* 126* 124* 146*   Lipid Profile: No results for input(s): CHOL, HDL, LDLCALC, TRIG, CHOLHDL, LDLDIRECT in the last 72 hours. Thyroid Function Tests: No results for input(s): TSH, T4TOTAL, FREET4, T3FREE, THYROIDAB in the last 72 hours. Anemia Panel: No results for input(s): VITAMINB12, FOLATE, FERRITIN, TIBC, IRON, RETICCTPCT in the last 72 hours. Sepsis Labs: No results for input(s): PROCALCITON, LATICACIDVEN in the last 168 hours.  No results found for this or any previous visit (from the past 240 hour(s)).   Radiology Studies: No results found. Scheduled Meds: . iopamidol      . enoxaparin (LOVENOX) injection  40 mg Subcutaneous Q24H  . feeding supplement (GLUCERNA SHAKE)  237 mL Oral TID BM  . FLUoxetine  20 mg Oral Daily  . folic acid  1 mg Oral Daily  . insulin aspart  0-15 Units Subcutaneous TID WC  . insulin aspart protamine- aspart  22 Units Subcutaneous Q supper  . insulin aspart protamine- aspart  26 Units Subcutaneous Q breakfast  . iopamidol  30 mL Oral Once  . multivitamin with minerals  1 tablet Oral Daily  . pregabalin  50 mg Oral TID  . sodium chloride  1,000 mL Intravenous Once  . thiamine  100 mg Oral Daily  . traZODone  50 mg Oral QHS   Continuous Infusions: . sodium chloride 100 mL/hr at 02/29/16 1033    LOS: 5 days   Merlene Laughtermair Latif Opaline Reyburn, DO [facility] Pager [phone]  If 7PM-7AM, please contact night-coverage [email or web address] Password [email or web address] 02/29/2016, 11:14 AM

## 2016-02-29 NOTE — Progress Notes (Signed)
Tech went to check 4am CBG, pt refused stating "I was told once my sugars were below 350 you didn't need to check my sugars anymore.  I am not refusing, I just don't see why you still need to check my sugars." Will continue to monitor.

## 2016-03-01 DIAGNOSIS — K5900 Constipation, unspecified: Secondary | ICD-10-CM

## 2016-03-01 LAB — COMPREHENSIVE METABOLIC PANEL
ALBUMIN: 3.3 g/dL — AB (ref 3.5–5.0)
ALK PHOS: 64 U/L (ref 38–126)
ALT: 27 U/L (ref 17–63)
ANION GAP: 9 (ref 5–15)
AST: 40 U/L (ref 15–41)
BILIRUBIN TOTAL: 0.5 mg/dL (ref 0.3–1.2)
BUN: 14 mg/dL (ref 6–20)
CO2: 26 mmol/L (ref 22–32)
Calcium: 8.7 mg/dL — ABNORMAL LOW (ref 8.9–10.3)
Chloride: 97 mmol/L — ABNORMAL LOW (ref 101–111)
Creatinine, Ser: 0.88 mg/dL (ref 0.61–1.24)
GFR calc Af Amer: >60 mL/min (ref >60)
GFR calc non Af Amer: >60 mL/min (ref >60)
GLUCOSE: 434 mg/dL — AB (ref 65–99)
POTASSIUM: 4.1 mmol/L (ref 3.5–5.1)
SODIUM: 132 mmol/L — AB (ref 135–145)
Total Protein: 6.1 g/dL — ABNORMAL LOW (ref 6.5–8.1)

## 2016-03-01 LAB — CBC WITH DIFFERENTIAL/PLATELET
BASOS ABS: 0 10*3/uL (ref 0.0–0.1)
BASOS PCT: 1 %
EOS ABS: 0.3 10*3/uL (ref 0.0–0.7)
Eosinophils Relative: 4 %
HEMATOCRIT: 36.5 % — AB (ref 39.0–52.0)
HEMOGLOBIN: 12.4 g/dL — AB (ref 13.0–17.0)
Lymphocytes Relative: 24 %
Lymphs Abs: 1.6 10*3/uL (ref 0.7–4.0)
MCH: 32.3 pg (ref 26.0–34.0)
MCHC: 34 g/dL (ref 30.0–36.0)
MCV: 95.1 fL (ref 78.0–100.0)
MONOS PCT: 10 %
Monocytes Absolute: 0.6 10*3/uL (ref 0.1–1.0)
NEUTROS ABS: 4.1 10*3/uL (ref 1.7–7.7)
NEUTROS PCT: 61 %
Platelets: 192 10*3/uL (ref 150–400)
RBC: 3.84 MIL/uL — ABNORMAL LOW (ref 4.22–5.81)
RDW: 12.6 % (ref 11.5–15.5)
WBC: 6.5 10*3/uL (ref 4.0–10.5)

## 2016-03-01 LAB — GLUCOSE, CAPILLARY
GLUCOSE-CAPILLARY: 127 mg/dL — AB (ref 65–99)
GLUCOSE-CAPILLARY: 324 mg/dL — AB (ref 65–99)
GLUCOSE-CAPILLARY: 166 mg/dL — AB (ref 65–99)
GLUCOSE-CAPILLARY: 78 mg/dL (ref 65–99)
Glucose-Capillary: 208 mg/dL — ABNORMAL HIGH (ref 65–99)
Glucose-Capillary: 218 mg/dL — ABNORMAL HIGH (ref 65–99)

## 2016-03-01 LAB — CREATININE, SERUM
Creatinine, Ser: 0.48 mg/dL — ABNORMAL LOW (ref 0.61–1.24)
GFR calc non Af Amer: >60 mL/min (ref >60)

## 2016-03-01 LAB — MAGNESIUM: Magnesium: 1.7 mg/dL (ref 1.7–2.4)

## 2016-03-01 LAB — POTASSIUM
POTASSIUM: 4.5 mmol/L (ref 3.5–5.1)
Potassium: 4 mmol/L (ref 3.5–5.1)

## 2016-03-01 LAB — PHOSPHORUS: Phosphorus: 2.3 mg/dL — ABNORMAL LOW (ref 2.5–4.6)

## 2016-03-01 MED ORDER — POLYETHYLENE GLYCOL 3350 17 G PO PACK
17.0000 g | PACK | Freq: Every day | ORAL | Status: DC
  Administered 2016-03-01 – 2016-03-03 (×3): 17 g via ORAL
  Filled 2016-03-01 (×3): qty 1

## 2016-03-01 MED ORDER — INSULIN ASPART PROT & ASPART (70-30 MIX) 100 UNIT/ML ~~LOC~~ SUSP
10.0000 [IU] | Freq: Every day | SUBCUTANEOUS | Status: DC
  Administered 2016-03-01 – 2016-03-02 (×2): 10 [IU] via SUBCUTANEOUS

## 2016-03-01 NOTE — Progress Notes (Signed)
PROGRESS NOTE    Kenneth [name]  ZOX:[id number] DOB: 1965-04-17 DOA: 02/23/2016 PCP: No PCP Per Patient   Brief Narrative:  Kenneth Crocker Vernonis an unfortunate 50 y.o.malewith history of DM2, hyperthyroidism who presents for hyperglycemia and suicidal thoughts. He has been admitted a few times this year for uncontrolled glucose. He received a match letter to obtain medications this past summer. Presented with a glucose of 777, ketonuria but not acidotic- admitted for concern for developing DKA. Patient improved and now is awaiting a bed at [facility] for his Suicidal Ideation. Patient's BS was uncontrolled overnight so adjustments have been made. His IV also infiltrated and was removed so patient was encouraged to orally hydrate. Patients BS have been getting better with increased 70/30 and currently awaiting [facility]. Hyperkalemia resolved and patient is medically stable with BS better controlled.   Assessment & Plan:   Principal Problem:   Diabetes mellitus with nonketotic hyperosmolarity (HCC) Active Problems:   Dehydration   Hyperthyroidism   Noncompliance with medication regimen   Hyperglycemia  DM1 -Patient's HbA1c went from 11.6 -> 13.2 - Severely uncontrolled due to erratic insulin use mainly due to non-compliance with outpt follow up  - Decraesed Insulin 70/30 Mix 24units in AM and 16 Units in PM - C/w Moderate Novolog SSI - Restarted IV Fluids - Continue to Monitor CBG's; Patients BS this AM was improved and CBG's ranged from 166-208 - PER Nurse Patient was eating multiple sandwiches, ice cream and meals even though he is on a Carb Modified Diet; Likely explanations of his BS varying so much.   Hyperkalemia, resolved   -Repeat today was 4.1 and improved after Interventions.     Mild Hypotension, resolved    - Blood Pressure this AM was 111/73    - Gave 1 Liter NS Bolus; C/w NS at 100 mL/hr    - Continue to Monitor BP  Left Sided Pain     - Appears to be neuropathy; Has not improved or worsened.  - Changed Gabapentin 300 mg TID to Lyrica 50 mg TID - Started Patient on Robaxin prn  - ?pain from Ill-defined apparent tree-in-bud nodular opacities within the imaged left lower lobe, nonspecific though potentially the sequela of infection (including atypical etiologies) inter aspiration  Depression - Appears to be Situational-  - Does not admit to being Suicidal today - [facility] go to [facility] pending bed availability - C/w Fluoxetine 20 mg po Daily started by Psychiatry as well as 50 mg po Trazadone for Insomina  Mild Hyponatremia, improved *could have been Psuedohyponatremia from Hyperglycemia,  - C/w IVF - Sodium was 132 today but BS on BMP was 434 - Reassess in AM with BMP  Social Issues- lack of income - Trying to get disability and Medicaid- claims he can't work due to pain - Per Sports coach, we cannot make and appt and he will need to be seen for "screening for financial eligibility to attend clinic." we are unable to provide more medications for him as he has already received a match letter this year -Will prescribe 70/30 (can get over the counter brand for < $10 at [employer]) to use when he runs out of current insulin. He states he is currently on Levemir and Novolog.  - [facility]  4 mm gallbladder polyp - found on ultrasound which was done due to RUQ pain.  - Outpatient Follow up  Right Lower Abdominal Pain -  CT Abd/Pelvis showed No definite explanation for patient's mid abdominal pain. Specifically, no evidence of urinary or enteric obstruction. Large colonic stool burden. Ill-defined apparent tree-in-bud nodular opacities within the imaged left lower lobe, nonspecific though potentially the sequela of infection (including atypical etiologies) inter aspiration.Clinical correlation is advised.  Aortic Atherosclerosis (ICD10-170.0)    Constipation -Started  Miralax 17 g po Daily -C/w Bisacodyl 10 mg po x 1 and Docusate 200 mg po BID   DVT prophylaxis: Lovenox sq Code Status: FULL CODE Family Communication: No Family present at bedside Disposition Plan: Inpatient Psychiatric Unit once Bed is Available   Consultants:   Pyschiatry  Procedures: CT of the Abdomen and Pelvis  Antimicrobials: None  Subjective: Seen and examined at bedside and was happy he got his ice cream last night and felt better today. Not in much pain but was constipated. No CP/SOB and denied suicidal Ideation. No other Complaints    Objective: Vitals:   02/29/16 1509 02/29/16 2011 03/01/16 0453 03/01/16 1444  BP: 104/67 110/76 94/74 111/73  Pulse: 70 67 60 71  Resp: 18 18 20 20   Temp: 99.1 F (37.3 C) 98.2 F (36.8 C) 97.7 F (36.5 C) 98.3 F (36.8 C)  TempSrc: Oral Oral Oral Oral  SpO2: 98% 99% 98% 100%  Weight:      Height:        Intake/Output Summary (Last 24 hours) at 03/01/16 1715 Last data filed at 03/01/16 1445  Gross per 24 hour  Intake             3220 ml  Output             5251 ml  Net            -2031 ml   Filed Weights   02/23/16 1248 02/28/16 1220  Weight: 52.8 kg (116 lb 8 oz) 55.1 kg (121 lb 7.6 oz)    Examination: Physical Exam:  Constitutional: WN/WD, NAD and appears calm and comfortable Eyes: Lids and conjunctivae normal, sclerae anicteric  ENMT: External Ears, Nose appear normal. Grossly normal hearing. Poor Dentition Neck: Appears normal, supple, no cervical masses, normal ROM, no appreciable thyromegaly; No JVD Respiratory: Clear to auscultation bilaterally, no wheezing, rales, rhonchi or crackles. Normal respiratory effort and patient is not tachypenic. No accessory muscle use.  Cardiovascular: RRR, no murmurs / rubs / gallops. S1 and S2 auscultated. No extremity edema.  Abdomen: Soft, not tender to palpation today, Non-Distended. No masses palpated. No appreciable hepatosplenomegaly. Bowel sounds positive x4.  GU:  Deferred. Musculoskeletal: No clubbing / cyanosis of digits/nails. No joint deformity upper and lower extremities.  Skin: No rashes, lesions, ulcers. No induration; Warm and dry.  Neurologic: CN 2-12 grossly intact with no focal deficits. Sensation intact in all 4 Extremities. Romberg sign cerebellar reflexes not assessed.  Psychiatric: Normal judgment and insight. Alert and oriented x 3. Depressed mood and flat affect.   Data Reviewed: I have personally reviewed following labs and imaging studies  CBC:  Recent Labs Lab 02/26/16 0345 02/27/16 0356 02/28/16 0840 02/29/16 0547 03/01/16 0921  WBC 6.4 5.7 5.6 5.9 6.5  NEUTROABS 4.1 2.9 3.5 2.7 4.1  HGB 12.2* 12.6* 13.2 12.3* 12.4*  HCT 34.7* 36.2* 39.2 36.2* 36.5*  MCV 92.8 93.5 95.1 94.5 95.1  PLT 133* 144* 157 175 192   Basic Metabolic Panel:  Recent Labs Lab 02/26/16 0345 02/27/16 0356 02/28/16 0840  02/29/16 0547  02/29/16 1846 02/29/16 2210 03/01/16 0232 03/01/16 0602 03/01/16 0921  NA  132* 137 129*  --  137  --   --   --   --   --  132*  K 4.1 4.2 6.6*  < > 4.6  < > 4.6 5.9* 4.0 4.5 4.1  CL 99* 101 92*  --  100*  --   --   --   --   --  97*  CO2 28 32 30  --  31  --   --   --   --   --  26  GLUCOSE 404* 211* 579*  --  149*  --   --   --   --   --  434*  BUN 10 15 16   --  15  --   --   --   --   --  14  CREATININE 0.63 0.70 0.81  --  0.65  --   --   --  0.48*  --  0.88  CALCIUM 8.0* 8.7* 8.7*  --  8.5*  --   --   --   --   --  8.7*  MG 1.8 1.9 1.6*  --  1.8  --   --   --   --   --  1.7  PHOS 4.0 4.6 4.2  --  3.7  --   --   --   --   --  2.3*  < > = values in this interval not displayed. GFR: Estimated Creatinine Clearance: 76.5 mL/min (by C-G formula based on SCr of 0.88 mg/dL). Liver Function Tests:  Recent Labs Lab 02/26/16 0345 02/27/16 0356 02/28/16 0840 02/29/16 0547 03/01/16 0921  AST 18 17 24 26  40  ALT 15* 16* 20 19 27   ALKPHOS 61 63 59 53 64  BILITOT 0.9 0.3 0.6 0.2* 0.5  PROT 5.2* 5.4* 6.2*  5.7* 6.1*  ALBUMIN 3.0* 3.2* 3.4* 3.1* 3.3*   No results for input(s): LIPASE, AMYLASE in the last 168 hours. No results for input(s): AMMONIA in the last 168 hours. Coagulation Profile: No results for input(s): INR, PROTIME in the last 168 hours. Cardiac Enzymes: No results for input(s): CKTOTAL, CKMB, CKMBINDEX, TROPONINI in the last 168 hours. BNP (last 3 results) No results for input(s): PROBNP in the last 8760 hours. HbA1C: No results for input(s): HGBA1C in the last 72 hours. CBG:  Recent Labs Lab 02/29/16 1625 02/29/16 2009 03/01/16 0723 03/01/16 1215 03/01/16 1440  GLUCAP 79 393* 324* 208* 166*   Lipid Profile: No results for input(s): CHOL, HDL, LDLCALC, TRIG, CHOLHDL, LDLDIRECT in the last 72 hours. Thyroid Function Tests: No results for input(s): TSH, T4TOTAL, FREET4, T3FREE, THYROIDAB in the last 72 hours. Anemia Panel: No results for input(s): VITAMINB12, FOLATE, FERRITIN, TIBC, IRON, RETICCTPCT in the last 72 hours. Sepsis Labs: No results for input(s): PROCALCITON, LATICACIDVEN in the last 168 hours.  No results found for this or any previous visit (from the past 240 hour(s)).   Radiology Studies: Ct Abdomen Pelvis W Contrast  Result Date: 02/29/2016 CLINICAL DATA:  Mid abdominal pain for the past 2-3 days. EXAM: CT ABDOMEN AND PELVIS WITH CONTRAST TECHNIQUE: Multidetector CT imaging of the abdomen and pelvis was performed using the standard protocol following bolus administration of intravenous contrast. CONTRAST:  100mL ISOVUE-300 IOPAMIDOL (ISOVUE-300) INJECTION 61% COMPARISON:  Right upper quadrant abdominal ultrasound - 02/24/2016 FINDINGS: Lower chest: Limited visualization of the lower thorax demonstrates ill-defined nodular opacities within the imaged left lower lobe which appear to demonstrate a tree-in-bud configuration (representative images 1  in 4, series 4). Minimal dependent subpleural ground-glass atelectasis within the right lower lobe. Normal  heart size.  No pericardial effusion. Hepatobiliary: Normal hepatic contour. No discrete hepatic lesions. Normal appearance of the gallbladder given degree distention. No radiopaque gallstones. No intra extrahepatic bili duct dilatation. No ascites. Pancreas: Normal appearance of the pancreas. Spleen: Normal appearance of the spleen Adrenals/Urinary Tract: There is symmetric enhancement and excretion of the bilateral kidneys. No definite renal stones on this postcontrast examination. No discrete renal lesions. Note is made of an extrarenal left renal pelvis. No definite evidence urinary obstruction. There is mild thickening of the left adrenal gland without discrete nodule. Normal appearance of the right adrenal gland. Normal appearance of the urinary bladder given degree distention. Stomach/Bowel: Ingested enteric contrast extensive the level of the distal small bowel. Large colonic stool burden without evidence of enteric obstruction. Normal appearance of the terminal ileum. The appendix is not visualized, however there is no pericecal inflammatory change. No pneumoperitoneum, pneumatosis or portal venous gas. Vascular/Lymphatic: Minimal amount of atherosclerotic plaque within a normal caliber abdominal aorta. The major branch vessels of the abdominal aorta appear widely patent on this non CTA examination. Note is made of a retroaortic left renal vein. No bulky retroperitoneal, mesenteric, pelvic or inguinal lymphadenopathy. Reproductive: Normal appearance of the pelvic organs. No free fluid in the pelvic cul-de-sac. Other: Subcutaneous edema within the ventral right lower abdominal body wall likely at the location of subcutaneous medication administration. Musculoskeletal: No acute or aggressive osseous abnormalities. 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 No definite explanation for patient's mid abdominal pain. Specifically, no evidence of urinary or enteric obstruction. 2.  Large colonic stool burden. 3. Ill-defined apparent tree-in-bud nodular opacities within the imaged left lower lobe, nonspecific though potentially the sequela of infection (including atypical etiologies) inter aspiration. Clinical correlation is advised. 4.  Aortic Atherosclerosis (ICD10-170.0) Electronically Signed   By: Simonne ComeJohn  Watts M.D.   On: 02/29/2016 14:12   Scheduled Meds: . docusate sodium  200 mg Oral BID  . enoxaparin (LOVENOX) injection  40 mg Subcutaneous Q24H  . feeding supplement (GLUCERNA SHAKE)  237 mL Oral TID BM  . FLUoxetine  20 mg Oral Daily  . folic acid  1 mg Oral Daily  . insulin aspart  0-15 Units Subcutaneous TID WC  . insulin aspart protamine- aspart  16 Units Subcutaneous Q supper  . insulin aspart protamine- aspart  24 Units Subcutaneous Q breakfast  . multivitamin with minerals  1 tablet Oral Daily  . polyethylene glycol  17 g Oral Daily  . pregabalin  50 mg Oral TID  . sodium chloride  1,000 mL Intravenous Once  . thiamine  100 mg Oral Daily  . traZODone  50 mg Oral QHS   Continuous Infusions: . sodium chloride 100 mL/hr at 03/01/16 0330    LOS: 6 days   Merlene Laughtermair Latif Sheikh, DO [facility] Pager [phone]  If 7PM-7AM, please contact night-coverage [email or web address] Password [email or web address] 03/01/2016, 5:15 PM

## 2016-03-01 NOTE — Progress Notes (Signed)
Nutrition Follow-up  DOCUMENTATION CODES:   Severe malnutrition in context of social or environmental circumstances, Underweight  INTERVENTION:   Continue Glucerna Shake po TID, each supplement provides 220 kcal and 10 grams of protein RD to monitor for additional needs  NUTRITION DIAGNOSIS:   Malnutrition related to social / environmental circumstances as evidenced by percent weight loss, moderate depletion of body fat, severe depletion of muscle mass.  Ongoing.  GOAL:   Patient will meet greater than or equal to 90% of their needs  Meeting.  MONITOR:   PO intake, Supplement acceptance, Labs, Weight trends, I & O's  ASSESSMENT:   50 y.o. male with medical history significant of diabetes mellitus type 2, ?seizures, ?thyroid disease. Patient's sister reports she brought the patient to the hospital because of suicidal ideation. He has been feeling this way for weeks. He reports not seeking treatment for his feelings. His plan includes not taking insulin and going into severe hyperglycemia. He reports having nausea with non-bloody, non-bilious vomiting for the past three days, which has worsened. He reports taking insulin when he can whenever someone is willing to give him some, as he does not have access to insulin hisself. He is unsure of what insulin he takes. Stress of his life situation has made his symptoms aggravated.  Patient eating very well, almost excessively. Per nursing notes, pt has been ordering multiple meals and ice cream. CBGs have been elevated. [facility].  Labs reviewed: CBGs: 166-208 Low Na, Phos K/Mg WNL  Diet Order:  Diet Carb Modified Fluid consistency: Thin; Room service appropriate? Yes Diet Carb Modified Fluid consistency: Thin; Room service appropriate? Yes Diet Carb Modified  Skin:  Reviewed, no issues  Last BM:  12/10  Height:   Ht Readings from Last 1 Encounters:  02/28/16 5\' 9"  (1.753 m)    Weight:   Wt Readings  from Last 1 Encounters:  02/28/16 121 lb 7.6 oz (55.1 kg)    Ideal Body Weight:  72.7 kg  BMI:  Body mass index is 17.94 kg/m.  Estimated Nutritional Needs:   Kcal:  1600-1800  Protein:  80-90g  Fluid:  1.6-1.8L/day  EDUCATION NEEDS:   Education needs no appropriate at this time  Tilda FrancoLindsey Lailanie Hasley, MS, RD, LDN Pager: [phone] After Hours Pager: [phone]

## 2016-03-01 NOTE — Progress Notes (Signed)
Last night patient was complaining of constipation, on call notified and new orders were given for stool softeners. Also, last night patient's BS was 393. On call notified and new orders were given for novolog 4 units.

## 2016-03-02 DIAGNOSIS — R109 Unspecified abdominal pain: Secondary | ICD-10-CM

## 2016-03-02 LAB — COMPREHENSIVE METABOLIC PANEL
ALT: 27 U/L (ref 17–63)
AST: 35 U/L (ref 15–41)
Albumin: 3.2 g/dL — ABNORMAL LOW (ref 3.5–5.0)
Alkaline Phosphatase: 58 U/L (ref 38–126)
Anion gap: 4 — ABNORMAL LOW (ref 5–15)
BILIRUBIN TOTAL: 0.4 mg/dL (ref 0.3–1.2)
BUN: 14 mg/dL (ref 6–20)
CALCIUM: 8.6 mg/dL — AB (ref 8.9–10.3)
CO2: 31 mmol/L (ref 22–32)
CREATININE: 0.66 mg/dL (ref 0.61–1.24)
Chloride: 102 mmol/L (ref 101–111)
GFR calc Af Amer: >60 mL/min (ref >60)
Glucose, Bld: 243 mg/dL — ABNORMAL HIGH (ref 65–99)
Potassium: 4.8 mmol/L (ref 3.5–5.1)
Sodium: 137 mmol/L (ref 135–145)
TOTAL PROTEIN: 5.9 g/dL — AB (ref 6.5–8.1)

## 2016-03-02 LAB — CBC WITH DIFFERENTIAL/PLATELET
BASOS ABS: 0 10*3/uL (ref 0.0–0.1)
Basophils Relative: 1 %
EOS PCT: 5 %
Eosinophils Absolute: 0.4 10*3/uL (ref 0.0–0.7)
HEMATOCRIT: 34.6 % — AB (ref 39.0–52.0)
Hemoglobin: 12.2 g/dL — ABNORMAL LOW (ref 13.0–17.0)
LYMPHS ABS: 2.4 10*3/uL (ref 0.7–4.0)
LYMPHS PCT: 31 %
MCH: 32.1 pg (ref 26.0–34.0)
MCHC: 35.3 g/dL (ref 30.0–36.0)
MCV: 91.1 fL (ref 78.0–100.0)
MONO ABS: 0.9 10*3/uL (ref 0.1–1.0)
Monocytes Relative: 11 %
NEUTROS ABS: 4 10*3/uL (ref 1.7–7.7)
Neutrophils Relative %: 52 %
PLATELETS: 200 10*3/uL (ref 150–400)
RBC: 3.8 MIL/uL — AB (ref 4.22–5.81)
RDW: 12.7 % (ref 11.5–15.5)
WBC: 7.6 10*3/uL (ref 4.0–10.5)

## 2016-03-02 LAB — GLUCOSE, CAPILLARY
GLUCOSE-CAPILLARY: 111 mg/dL — AB (ref 65–99)
Glucose-Capillary: 250 mg/dL — ABNORMAL HIGH (ref 65–99)
Glucose-Capillary: 257 mg/dL — ABNORMAL HIGH (ref 65–99)
Glucose-Capillary: 143 mg/dL — ABNORMAL HIGH (ref 65–99)

## 2016-03-02 LAB — PHOSPHORUS: PHOSPHORUS: 3.5 mg/dL (ref 2.5–4.6)

## 2016-03-02 LAB — MAGNESIUM: MAGNESIUM: 2 mg/dL (ref 1.7–2.4)

## 2016-03-02 NOTE — Progress Notes (Signed)
Inpatient Diabetes Program Recommendations  AACE/ADA: New Consensus Statement on Inpatient Glycemic Control (2015)  Target Ranges:  Prepandial:   less than 140 mg/dL      Peak postprandial:   less than 180 mg/dL (1-2 hours)      Critically ill patients:  140 - 180 mg/dL   Lab Results  Component Value Date   GLUCAP 257 (H) 03/02/2016   HGBA1C 13.2 (H) 02/23/2016    Review of Glycemic Control  Diabetes history: DM2 Outpatient Diabetes medications: NPH 20 units bid Current orders for Inpatient glycemic control: 70/30 24 units in am and 10 units QPM, Novolog 0-15 units tidwc.  Needs insulin adjustment.  Inpatient Diabetes Program Recommendations:    Increase 70/30 to 26 units in am and 20 units QPM. Add HS correction.  -Note Care management consulted. Already taking the least expensive insulin at home. Reli-on brand 70/30 insulin from [employer] is $25 per vial (Order # [id number]).  Note also patient and his family were given lists of multiple free/low cost clinics in the area.  -Per MD H&P note: "He reports taking insulin when he can whenever someone is willing to give him some, as he does not have access to insulin hisself."  -Note current A1c= 13.2%.  Not surprising since pt not taking insulin consistently due to financial issues.   Will follow. Thank you. Ailene Ardshonda Estellar Cadena, RD, LDN, CDE Inpatient Diabetes Coordinator [phone]

## 2016-03-02 NOTE — Clinical Social Work Psych Note (Addendum)
Clinical Social Worker Psych Service Line Progress Note  Clinical Social Worker: Lia Hopping, LCSW Date/Time: 03/02/2016, 4:40 PM   Review of Patient  Overall Medical Condition:  Patient reports he is having pain.   Participation Level:  Active Participation Quality: Appropriate Other Participation Quality:  Cooperative   Affect:  (Cooperative) Cognitive: Alert, Oriented Reaction to Medications/Concerns: Patient feels the pain medication is taking is not helping his pain.   Modes of Intervention: Solution-focused, Exploration   Summary of Progress/Plan at Discharge  Summary of Progress/Plan at Discharge: LCSWA met with patient at bedside, agreeable to consult. Patient reports he has been feeling better but has been in pain today. Patientdenies SI/HI for the past few days. The patient reports he been eating and drinking well. Patient feels the staff have been treating him well. Patient reports he does not look forward to going home as he knows his finances are still pending.  LCSWA encouraged patient to follow up with his lawyer and DSS.  LCSWA spoke to with patient sister and discussed patient care and follow up. She reports when she spoke with patient he continues to say he is not getting the help he needs and says " I would rather be a dead man than a begging man." LCSWA informed sister pt. Did not express those thought with this Probation officer and psychiatrist during visits and past few days.  Pt. Sister reports she has been working with Adult medicaid through social services, Marliss Coots.   She reports she also called Adult Protective Services and plans to follow up once patient is home.  LCSWA will continue to support patient until discharge.    LCSWA informed nurse secretary about patient thoughts reported by pt. sister, she reports to inform RN.   Kathrin Greathouse, Latanya Presser, MSW Clinical Social Worker 5E and Psychiatric Service Line [phone] 03/02/2016  5:12 PM

## 2016-03-02 NOTE — Progress Notes (Signed)
PROGRESS NOTE    Tommy RainwaterRandy C Glad  ZOX:[id number] DOB: 04/14/1965 DOA: 02/23/2016 PCP: No PCP Per Patient   Brief Narrative:  Kenneth Crockerandy C Vernonis an unfortunate 50 y.o.malewith history of DM2, hyperthyroidism who presents for hyperglycemia and suicidal thoughts. He has been admitted a few times this year for uncontrolled glucose. He received a match letter to obtain medications this past summer. Presented with a glucose of 777, ketonuria but not acidotic- admitted for concern for developing DKA. Patient was admitted and rehydrated and his BS have been very difficult to control. [facility] Su[facility] Ideation, but he was re-evaluated by Dr. Elsie SaasJonnalagadda who has cleared the patient to go home. Patient has tremendous amount of social issues and will re-evaluate his Insulin Regimen prior to discharge. He has been complaining of various somatic complaints while in the hospital.   Assessment & Plan:   Principal Problem:   Diabetes mellitus with nonketotic hyperosmolarity (HCC) Active Problems:   Dehydration   Hyperthyroidism   Noncompliance with medication regimen   Hyperglycemia  DM1 -Patient's HbA1c went from 11.6 -> 13.2 - Severely uncontrolled due to erratic insulin use mainly due to non-compliance with outpt follow up  - Decraesed Insulin 70/30 Mix 24units in AM and 16 Units in PM - C/w Moderate Novolog SSI - Restarted IV Fluids - Continue to Monitor CBG's; Patients BS this AM was improved and CBG's ranged from 111-257 - PER Nurse Patient was eating multiple sandwiches, ice cream and meals even though he is on a Carb Modified Diet; Likely explanations of his BS varying so much.     Mild Hypotension, resolved    - Blood Pressure this AM was 111/73    - C/w NS at 100 mL/hr    - Continue to Monitor BP  Left Sided Pain   - Appears to be neuropathy; Has not improved or worsened.  - Changed Gabapentin 300 mg TID to Lyrica 50 mg TID - Started  Patient on Robaxin prn  - ?pain from Ill-defined apparent tree-in-bud nodular opacities within the imaged left lower lobe, nonspecific though potentially the sequela of infection (including atypical etiologies) inter aspiration - Continue to Monitor  Depression - Appears to be Situational-  - Does not admit to being Suicidal today; - Re-Evaluated with Psychiatry and Patient cleared to be D/C'd Home from their Standpoint - C/w Fluoxetine 20 mg po Daily started by Psychiatry as well as 50 mg po Trazadone for Insomina  Social Issues- lack of income - Trying to get disability and Medicaid- claims he can't work due to pain - Per Sports coachcase manager, we cannot make and appt and he will need to be seen for "screening for financial eligibility to attend clinic." we are unable to provide more medications for him as he has already received a match letter this year -Will prescribe 70/30 (can get over the counter brand for < $10 at [facility]) to use when he runs out of current insulin. He states he is currently on Levemir and Novolog.  - Will need follow up with [facility]C  4 mm gallbladder polyp - found on ultrasound which was done due to RUQ pain.  - Outpatient Follow up  Right Lower Abdominal Pain -CT Abd/Pelvis showed No definite explanation for patient's mid abdominal pain. Specifically, no evidence of urinary or enteric obstruction. Large colonic stool burden. Ill-defined apparent tree-in-bud nodular opacities within the imaged left lower lobe, nonspecific though potentially the sequela of infection (  including atypical etiologies) inter aspiration.Clinical correlation is advised.  Aortic Atherosclerosis (ICD10-170.0)    Constipation -Started Miralax 17 g po Daily -C/w Bisacodyl 10 mg po x 1 and Docusate 200 mg po BID    ? Hyperthyroid -TSH was 1.373 and Free T4 0.87 -Will need to Monitor again in 6 weeks -Previously on Tapazol per old notes?  DVT prophylaxis: Lovenox sq Code Status:  FULL CODE Family Communication: No Family present at bedside Disposition Plan: D/C Home in AM now that Patient has been cleared by Psych.  Consultants:   Pyschiatry  Procedures: CT of the Abdomen and Pelvis  Antimicrobials: None  Subjective: Seen and examined at bedside and was in pain today and was in pain "all over". No new complaints and no acute events over night.   Objective: Vitals:   03/01/16 2157 03/02/16 0557 03/02/16 1443 03/02/16 2113  BP: 94/60 102/64 108/65 108/65  Pulse: 63 (!) 58 67 (!) 57  Resp: 20 18 18 18   Temp: 97.8 F (36.6 C) 97.7 F (36.5 C) 98.1 F (36.7 C) 97.8 F (36.6 C)  TempSrc: Oral Oral Oral Oral  SpO2: 99% 98% 99% 99%  Weight:      Height:        Intake/Output Summary (Last 24 hours) at 03/02/16 2141 Last data filed at 03/02/16 2045  Gross per 24 hour  Intake             4760 ml  Output             5450 ml  Net             -690 ml   Filed Weights   02/23/16 1248 02/28/16 1220  Weight: 52.8 kg (116 lb 8 oz) 55.1 kg (121 lb 7.6 oz)    Examination: Physical Exam:  Constitutional: Thin male, NAD and appears calm and comfortable Eyes: Lids and conjunctivae normal, sclerae anicteric  ENMT: External Ears, Nose appear normal. Grossly normal hearing. Poor Dentition Neck: Appears normal, supple, no cervical masses, normal ROM, no appreciable thyromegaly; No JVD Respiratory: Clear to auscultation bilaterally, no wheezing, rales, rhonchi or crackles. Normal respiratory effort and patient is not tachypenic. No accessory muscle use.  Cardiovascular: RRR, no murmurs / rubs / gallops. S1 and S2 auscultated. No extremity edema.  Abdomen: Soft, not tender to palpation, Non-Distended. No masses palpated. No appreciable hepatosplenomegaly. Bowel sounds positive x4.  GU: Deferred. Musculoskeletal: No clubbing / cyanosis of digits/nails. No joint deformity upper and lower extremities.  Skin: No rashes, lesions, ulcers. No induration; Warm and dry.    Neurologic: CN 2-12 grossly intact with no focal deficits. Sensation intact in all 4 Extremities. Romberg sign cerebellar reflexes not assessed.  Psychiatric: Normal judgment and insight. Alert and oriented x 3. Depressed mood and flat affect. Not suicidal.   Data Reviewed: I have personally reviewed following labs and imaging studies  CBC:  Recent Labs Lab 02/27/16 0356 02/28/16 0840 02/29/16 0547 03/01/16 0921 03/02/16 0502  WBC 5.7 5.6 5.9 6.5 7.6  NEUTROABS 2.9 3.5 2.7 4.1 4.0  HGB 12.6* 13.2 12.3* 12.4* 12.2*  HCT 36.2* 39.2 36.2* 36.5* 34.6*  MCV 93.5 95.1 94.5 95.1 91.1  PLT 144* 157 175 192 200   Basic Metabolic Panel:  Recent Labs Lab 02/27/16 0356 02/28/16 0840  02/29/16 0547  02/29/16 2210 03/01/16 0232 03/01/16 0602 03/01/16 0921 03/02/16 0502  NA 137 129*  --  137  --   --   --   --  132*  137  K 4.2 6.6*  < > 4.6  < > 5.9* 4.0 4.5 4.1 4.8  CL 101 92*  --  100*  --   --   --   --  97* 102  CO2 32 30  --  31  --   --   --   --  26 31  GLUCOSE 211* 579*  --  149*  --   --   --   --  434* 243*  BUN 15 16  --  15  --   --   --   --  14 14  CREATININE 0.70 0.81  --  0.65  --   --  0.48*  --  0.88 0.66  CALCIUM 8.7* 8.7*  --  8.5*  --   --   --   --  8.7* 8.6*  MG 1.9 1.6*  --  1.8  --   --   --   --  1.7 2.0  PHOS 4.6 4.2  --  3.7  --   --   --   --  2.3* 3.5  < > = values in this interval not displayed. GFR: Estimated Creatinine Clearance: 84.2 mL/min (by C-G formula based on SCr of 0.66 mg/dL). Liver Function Tests:  Recent Labs Lab 02/27/16 0356 02/28/16 0840 02/29/16 0547 03/01/16 0921 03/02/16 0502  AST 17 24 26  40 35  ALT 16* 20 19 27 27   ALKPHOS 63 59 53 64 58  BILITOT 0.3 0.6 0.2* 0.5 0.4  PROT 5.4* 6.2* 5.7* 6.1* 5.9*  ALBUMIN 3.2* 3.4* 3.1* 3.3* 3.2*   No results for input(s): LIPASE, AMYLASE in the last 168 hours. No results for input(s): AMMONIA in the last 168 hours. Coagulation Profile: No results for input(s): INR, PROTIME in  the last 168 hours. Cardiac Enzymes: No results for input(s): CKTOTAL, CKMB, CKMBINDEX, TROPONINI in the last 168 hours. BNP (last 3 results) No results for input(s): PROBNP in the last 8760 hours. HbA1C: No results for input(s): HGBA1C in the last 72 hours. CBG:  Recent Labs Lab 03/01/16 1720 03/01/16 2155 03/02/16 0716 03/02/16 1132 03/02/16 1650  GLUCAP 78 218* 250* 257* 111*   Lipid Profile: No results for input(s): CHOL, HDL, LDLCALC, TRIG, CHOLHDL, LDLDIRECT in the last 72 hours. Thyroid Function Tests: No results for input(s): TSH, T4TOTAL, FREET4, T3FREE, THYROIDAB in the last 72 hours. Anemia Panel: No results for input(s): VITAMINB12, FOLATE, FERRITIN, TIBC, IRON, RETICCTPCT in the last 72 hours. Sepsis Labs: No results for input(s): PROCALCITON, LATICACIDVEN in the last 168 hours.  No results found for this or any previous visit (from the past 240 hour(s)).   Radiology Studies: No results found. Scheduled Meds: . docusate sodium  200 mg Oral BID  . enoxaparin (LOVENOX) injection  40 mg Subcutaneous Q24H  . feeding supplement (GLUCERNA SHAKE)  237 mL Oral TID BM  . FLUoxetine  20 mg Oral Daily  . folic acid  1 mg Oral Daily  . insulin aspart  0-15 Units Subcutaneous TID WC  . insulin aspart protamine- aspart  10 Units Subcutaneous Q supper  . insulin aspart protamine- aspart  24 Units Subcutaneous Q breakfast  . multivitamin with minerals  1 tablet Oral Daily  . polyethylene glycol  17 g Oral Daily  . pregabalin  50 mg Oral TID  . sodium chloride  1,000 mL Intravenous Once  . thiamine  100 mg Oral Daily  . traZODone  50 mg Oral QHS   Continuous  Infusions: . sodium chloride 100 mL/hr at 03/02/16 2033    LOS: 7 days   Merlene Laughter, DO [facility] Pager [phone]  If 7PM-7AM, please contact night-coverage [email or web address] Password [facility] 03/02/2016, 9:41 PM

## 2016-03-02 NOTE — Consult Note (Signed)
[place] [facility] Consult   Reason for Consult:  Depression and suicide ideation Referring Physician:  Dr. Lonny Prude Patient Identification: Kenneth [name] MRN:  [id number] Principal Diagnosis: Diabetes mellitus with nonketotic hyperosmolarity ([place]) Diagnosis:   Patient Active Problem List   Diagnosis Date Noted  . Hyperglycemia [R73.9] 02/24/2016  . Volume depletion [E86.9] 12/21/2015  . Metabolic acidosis [X32.4] 12/21/2015  . Diabetes mellitus with nonketotic hyperosmolarity ([place]) [E11.00] 09/21/2014  . Noncompliance with medication regimen [Z91.14]   . DM (diabetes mellitus) ([place]) [E11.9]   . DKA (diabetic ketoacidoses) ([place]) [E13.10] 07/24/2014  . Dehydration [E86.0] 07/24/2014  . Acute renal failure ([place]) [N17.9] 07/24/2014  . Hyperkalemia [E87.5] 07/24/2014  . Hyponatremia [E87.1] 07/24/2014  . Hyperthyroidism [E05.90] 07/24/2014    Total Time spent with patient: 1 hour  Subjective:   Kenneth [name] is a 50 y.o. male patient admitted with suicidal ideation.  HPI:  Kenneth [name] is a 50 y.o. male seen, chart reviewed and case discussed with staff RN and also psychiatric LCSW. Patient continued to endorse symptoms of depression, anxiety and frustration about not able to get psychosocial support, disability and Medicaid services and reportedly living in a disability house and has no food or money. Patient also reportedly not able to the care of his diabetes because of too many worries that he has been facing. Patient reported he can maintain his diabetes and came to the hospital to get additional services otherwise he feels he should end his life even though he does not have any intention or plan at this time. Patient reportedly has limited family support. Patient reported his sister is supportive and help sort out what she can do for him. Patient reported his parents passed away. Patient is not able to contract for safety without additional services. Patient has no previous  history of suicidal attempt. Patient also has no previous psychiatric inpatient treatments. Patient has no evidence of auditory/visual hallucinations, delusions and paranoia.   Past Psychiatric History: Patient has no previous history of acute psychiatric hospitalizations or outpatient medication management.   Interval history: Case discussed with LCSW. Patient has no new complaints and stated that he has no active suicide ideation, intention or plans for the last few days but continue to worry about not having adequate psychosocial support at home. His sister and DSS has been working to obtain the support he needed. He has been eating and sleeping well. He has no evidence of depression, anxiety or psychosis. Patient will be referred to out patient psychiatry when medically stable.   Risk to Self: Is patient at risk for suicide?: NO Risk to Others:   Prior Inpatient Therapy:   Prior Outpatient Therapy:    Past Medical History:  Past Medical History:  Diagnosis Date  . Arthritis   . Diabetes mellitus   . DKA (diabetic ketoacidosis) ([place]) 07/2014  . Malnutrition ([place])   . Noncompliance with medication regimen   . Thyroid disease    History reviewed. No pertinent surgical history. Family History:  Family History  Problem Relation Age of Onset  . Diabetes Mother   . Seizures Sister   . Diabetes Sister    Family Psychiatric  History: Noncontributory  Social History:  History  Alcohol Use No     History  Drug Use No    Social History   Social History  . Marital status: Divorced    Spouse name: N/A  . Number of children: N/A  . Years of education: N/A   Social  History Main Topics  . Smoking status: Current Every Day Smoker    Packs/day: 0.50    Years: 8.00  . Smokeless tobacco: Never Used  . Alcohol use No  . Drug use: No  . Sexual activity: Not Asked   Other Topics Concern  . None   Social History Narrative  . None   Additional Social History:    Allergies:  No  Known Allergies  Labs:  Results for orders placed or performed during the hospital encounter of 02/23/16 (from the past 48 hour(s))  Potassium     Status: None   Collection Time: 02/29/16  1:30 PM  Result Value Ref Range   Potassium 4.0 3.5 - 5.1 mmol/L  Glucose, capillary     Status: None   Collection Time: 02/29/16  4:25 PM  Result Value Ref Range   Glucose-Capillary 79 65 - 99 mg/dL  Potassium     Status: None   Collection Time: 02/29/16  6:46 PM  Result Value Ref Range   Potassium 4.6 3.5 - 5.1 mmol/L  Glucose, capillary     Status: Abnormal   Collection Time: 02/29/16  8:09 PM  Result Value Ref Range   Glucose-Capillary 393 (H) 65 - 99 mg/dL  Potassium     Status: Abnormal   Collection Time: 02/29/16 10:10 PM  Result Value Ref Range   Potassium 5.9 (H) 3.5 - 5.1 mmol/L    Comment: RESULT REPEATED AND VERIFIED DELTA CHECK NOTED NO VISIBLE HEMOLYSIS   Potassium     Status: None   Collection Time: 03/01/16  2:32 AM  Result Value Ref Range   Potassium 4.0 3.5 - 5.1 mmol/L    Comment: RESULT REPEATED AND VERIFIED DELTA CHECK NOTED   Creatinine, serum     Status: Abnormal   Collection Time: 03/01/16  2:32 AM  Result Value Ref Range   Creatinine, Ser 0.48 (L) 0.61 - 1.24 mg/dL   GFR calc non Af Amer >60 >60 mL/min   GFR calc Af Amer >60 >60 mL/min    Comment: (NOTE) The eGFR has been calculated using the CKD EPI equation. This calculation has not been validated in all clinical situations. eGFR's persistently <60 mL/min signify possible Chronic Kidney Disease.   Potassium     Status: None   Collection Time: 03/01/16  6:02 AM  Result Value Ref Range   Potassium 4.5 3.5 - 5.1 mmol/L  Glucose, capillary     Status: Abnormal   Collection Time: 03/01/16  7:23 AM  Result Value Ref Range   Glucose-Capillary 324 (H) 65 - 99 mg/dL  CBC with Differential/Platelet     Status: Abnormal   Collection Time: 03/01/16  9:21 AM  Result Value Ref Range   WBC 6.5 4.0 - 10.5 K/uL    RBC 3.84 (L) 4.22 - 5.81 MIL/uL   Hemoglobin 12.4 (L) 13.0 - 17.0 g/dL   HCT 36.5 (L) 39.0 - 52.0 %   MCV 95.1 78.0 - 100.0 fL   MCH 32.3 26.0 - 34.0 pg   MCHC 34.0 30.0 - 36.0 g/dL   RDW 12.6 11.5 - 15.5 %   Platelets 192 150 - 400 K/uL   Neutrophils Relative % 61 %   Neutro Abs 4.1 1.7 - 7.7 K/uL   Lymphocytes Relative 24 %   Lymphs Abs 1.6 0.7 - 4.0 K/uL   Monocytes Relative 10 %   Monocytes Absolute 0.6 0.1 - 1.0 K/uL   Eosinophils Relative 4 %   Eosinophils Absolute 0.3 0.0 -  0.7 K/uL   Basophils Relative 1 %   Basophils Absolute 0.0 0.0 - 0.1 K/uL  Comprehensive metabolic panel     Status: Abnormal   Collection Time: 03/01/16  9:21 AM  Result Value Ref Range   Sodium 132 (L) 135 - 145 mmol/L   Potassium 4.1 3.5 - 5.1 mmol/L   Chloride 97 (L) 101 - 111 mmol/L   CO2 26 22 - 32 mmol/L   Glucose, Bld 434 (H) 65 - 99 mg/dL   BUN 14 6 - 20 mg/dL   Creatinine, Ser 0.88 0.61 - 1.24 mg/dL   Calcium 8.7 (L) 8.9 - 10.3 mg/dL   Total Protein 6.1 (L) 6.5 - 8.1 g/dL   Albumin 3.3 (L) 3.5 - 5.0 g/dL   AST 40 15 - 41 U/L   ALT 27 17 - 63 U/L   Alkaline Phosphatase 64 38 - 126 U/L   Total Bilirubin 0.5 0.3 - 1.2 mg/dL   GFR calc non Af Amer >60 >60 mL/min   GFR calc Af Amer >60 >60 mL/min    Comment: (NOTE) The eGFR has been calculated using the CKD EPI equation. This calculation has not been validated in all clinical situations. eGFR's persistently <60 mL/min signify possible Chronic Kidney Disease.    Anion gap 9 5 - 15  Magnesium     Status: None   Collection Time: 03/01/16  9:21 AM  Result Value Ref Range   Magnesium 1.7 1.7 - 2.4 mg/dL  Phosphorus     Status: Abnormal   Collection Time: 03/01/16  9:21 AM  Result Value Ref Range   Phosphorus 2.3 (L) 2.5 - 4.6 mg/dL  Glucose, capillary     Status: Abnormal   Collection Time: 03/01/16 12:15 PM  Result Value Ref Range   Glucose-Capillary 208 (H) 65 - 99 mg/dL  Glucose, capillary     Status: Abnormal   Collection Time:  03/01/16  2:40 PM  Result Value Ref Range   Glucose-Capillary 166 (H) 65 - 99 mg/dL  Glucose, capillary     Status: None   Collection Time: 03/01/16  5:20 PM  Result Value Ref Range   Glucose-Capillary 78 65 - 99 mg/dL  Glucose, capillary     Status: Abnormal   Collection Time: 03/01/16  9:55 PM  Result Value Ref Range   Glucose-Capillary 218 (H) 65 - 99 mg/dL  CBC with Differential/Platelet     Status: Abnormal   Collection Time: 03/02/16  5:02 AM  Result Value Ref Range   WBC 7.6 4.0 - 10.5 K/uL   RBC 3.80 (L) 4.22 - 5.81 MIL/uL   Hemoglobin 12.2 (L) 13.0 - 17.0 g/dL   HCT 34.6 (L) 39.0 - 52.0 %   MCV 91.1 78.0 - 100.0 fL   MCH 32.1 26.0 - 34.0 pg   MCHC 35.3 30.0 - 36.0 g/dL   RDW 12.7 11.5 - 15.5 %   Platelets 200 150 - 400 K/uL   Neutrophils Relative % 52 %   Neutro Abs 4.0 1.7 - 7.7 K/uL   Lymphocytes Relative 31 %   Lymphs Abs 2.4 0.7 - 4.0 K/uL   Monocytes Relative 11 %   Monocytes Absolute 0.9 0.1 - 1.0 K/uL   Eosinophils Relative 5 %   Eosinophils Absolute 0.4 0.0 - 0.7 K/uL   Basophils Relative 1 %   Basophils Absolute 0.0 0.0 - 0.1 K/uL  Comprehensive metabolic panel     Status: Abnormal   Collection Time: 03/02/16  5:02 AM  Result Value Ref Range   Sodium 137 135 - 145 mmol/L   Potassium 4.8 3.5 - 5.1 mmol/L   Chloride 102 101 - 111 mmol/L   CO2 31 22 - 32 mmol/L   Glucose, Bld 243 (H) 65 - 99 mg/dL   BUN 14 6 - 20 mg/dL   Creatinine, Ser 0.66 0.61 - 1.24 mg/dL   Calcium 8.6 (L) 8.9 - 10.3 mg/dL   Total Protein 5.9 (L) 6.5 - 8.1 g/dL   Albumin 3.2 (L) 3.5 - 5.0 g/dL   AST 35 15 - 41 U/L   ALT 27 17 - 63 U/L   Alkaline Phosphatase 58 38 - 126 U/L   Total Bilirubin 0.4 0.3 - 1.2 mg/dL   GFR calc non Af Amer >60 >60 mL/min   GFR calc Af Amer >60 >60 mL/min    Comment: (NOTE) The eGFR has been calculated using the CKD EPI equation. This calculation has not been validated in all clinical situations. eGFR's persistently <60 mL/min signify possible Chronic  Kidney Disease.    Anion gap 4 (L) 5 - 15  Magnesium     Status: None   Collection Time: 03/02/16  5:02 AM  Result Value Ref Range   Magnesium 2.0 1.7 - 2.4 mg/dL  Phosphorus     Status: None   Collection Time: 03/02/16  5:02 AM  Result Value Ref Range   Phosphorus 3.5 2.5 - 4.6 mg/dL  Glucose, capillary     Status: Abnormal   Collection Time: 03/02/16  7:16 AM  Result Value Ref Range   Glucose-Capillary 250 (H) 65 - 99 mg/dL  Glucose, capillary     Status: Abnormal   Collection Time: 03/02/16 11:32 AM  Result Value Ref Range   Glucose-Capillary 257 (H) 65 - 99 mg/dL    Current Facility-Administered Medications  Medication Dose Route Frequency Provider Last Rate Last Dose  . 0.9 %  sodium chloride infusion   Intravenous Continuous [place], DO 100 mL/hr at 03/02/16 1055    . acetaminophen (TYLENOL) tablet 650 mg  650 mg Oral Q6H PRN Mariel Aloe, MD       Or  . acetaminophen (TYLENOL) suppository 650 mg  650 mg Rectal Q6H PRN Mariel Aloe, MD      . docusate sodium (COLACE) capsule 200 mg  200 mg Oral BID Rhetta Mura Schorr, NP   200 mg at 03/02/16 1022  . enoxaparin (LOVENOX) injection 40 mg  40 mg Subcutaneous Q24H Mariel Aloe, MD   40 mg at 03/01/16 1746  . feeding supplement (GLUCERNA SHAKE) (GLUCERNA SHAKE) liquid 237 mL  237 mL Oral TID BM Debbe Odea, MD   237 mL at 03/02/16 1023  . FLUoxetine (PROZAC) capsule 20 mg  20 mg Oral Daily Ambrose Finland, MD   20 mg at 03/02/16 1023  . folic acid (FOLVITE) tablet 1 mg  1 mg Oral Daily Mariel Aloe, MD   1 mg at 03/02/16 1023  . insulin aspart (novoLOG) injection 0-15 Units  0-15 Units Subcutaneous TID WC Kerney Elbe, DO   8 Units at 03/02/16 1204  . insulin aspart protamine- aspart (NOVOLOG MIX 70/30) injection 10 Units  10 Units Subcutaneous Q supper Kerney Elbe, DO   10 Units at 03/01/16 1803  . insulin aspart protamine- aspart (NOVOLOG MIX 70/30) injection 24 Units  24 Units  Subcutaneous Q breakfast [place], DO   24 Units at 03/02/16 0820  . methocarbamol (ROBAXIN)  tablet 500 mg  500 mg Oral Q8H PRN [place], DO   500 mg at 03/02/16 0539  . multivitamin with minerals tablet 1 tablet  1 tablet Oral Daily Mariel Aloe, MD   1 tablet at 03/02/16 1023  . ondansetron (ZOFRAN) tablet 4 mg  4 mg Oral Q6H PRN Mariel Aloe, MD       Or  . ondansetron [facility]) injection 4 mg  4 mg Intravenous Q6H PRN Mariel Aloe, MD      . polyethylene glycol (MIRALAX / GLYCOLAX) packet 17 g  17 g Oral Daily [place], DO   17 g at 03/02/16 1022  . pregabalin (LYRICA) capsule 50 mg  50 mg Oral TID [place], DO   50 mg at 03/02/16 1023  . sodium chloride 0.9 % bolus 1,000 mL  1,000 mL Intravenous Once [employer], DO      . thiamine (VITAMIN B-1) tablet 100 mg  100 mg Oral Daily Mariel Aloe, MD   100 mg at 03/02/16 1022  . traMADol (ULTRAM) tablet 100 mg  100 mg Oral Q6H PRN [place], NP   100 mg at 03/02/16 0820  . traZODone (DESYREL) tablet 50 mg  50 mg Oral QHS Ambrose Finland, MD   50 mg at 03/01/16 2100    Musculoskeletal: Strength & Muscle Tone: decreased Gait & Station: normal Patient leans: N/A  Psychiatric Specialty Exam: Physical Exam  as per history and physical   ROS Complaining about depression, anxiety, insomnia, lack of psychosocial support and frustrated regarding resected from disability services in several times.  No Fever-chills, No Headache, No changes with Vision or hearing, reports vertigo No problems swallowing food or Liquids, No Chest pain, Cough or Shortness of Breath, No Abdominal pain, No Nausea or Vommitting, Bowel movements are regular, No Blood in stool or Urine, No dysuria, No new skin rashes or bruises, No new joints pains-aches,  No new weakness, tingling, numbness in any extremity, No recent weight gain or loss, No polyuria, polydypsia or polyphagia,   A full 10  point Review of Systems was done, except as stated above, all other Review of Systems were negative.  Blood pressure 102/64, pulse (!) 58, temperature 97.7 F (36.5 C), temperature source Oral, resp. rate 18, height _0  (1.753 m), weight 55.1 kg (121 lb 7.6 oz), SpO2 98 %.Body mass index is 17.94 kg/m.  General Appearance: Disheveled  Eye Contact:  Good  Speech:  Clear and Coherent  Volume:  Normal  Mood:  Anxious and Depressed  Affect:  Appropriate, Congruent and Depressed  Thought Process:  Coherent and Goal Directed  Orientation:  Full (Time, Place, and Person)  Thought Content:  WDL and Rumination  Suicidal Thoughts:  No  Homicidal Thoughts:  No  Memory:  Immediate;   Fair Recent;   Fair Remote;   Fair  Judgement:  Intact  Insight:  Fair  Psychomotor Activity:  Decreased  Concentration:  Concentration: Fair and Attention Span: Fair  Recall:  Good  Fund of Knowledge:  Good  Language:  Good  Akathisia:  Negative  Handed:  Right  AIMS (if indicated):     Assets:  Communication Skills Desire for Improvement Housing Leisure Time Resilience  ADL's:  Intact  Cognition:  WNL  Sleep:        Treatment Plan Summary: Patient has depression, anxiety, insomnia unable to care for himself and have his passive suicidal ideation. Patient felt he has  been rejected from disability services and having chronic medical condition and not have income and unable to work for long time.  Patient has been free from suicide ideation and contract for safety while in hospital Fluoxetine 20 mg daily for depression  Trazodone 50 mg at bedtime for insomnia   Case discussed with the psychiatrist LCSW regarding psychosocial support Appreciate psychiatric consultation and we sign off as of today Please contact [phone] or [phone] if needs further assistance  Disposition: Discharge to home with help of his sister, who is working along with DSS and disability lawyer regarding his public assistance  etc No evidence of imminent risk to self or others at present.   Supportive therapy provided about ongoing stressors.  Ambrose Finland, MD 03/02/2016 12:57 PM

## 2016-03-02 NOTE — Progress Notes (Signed)
LCSWA continues to follow patient for disposition/ pt. clinicals [facility]: [facility] [place]  [facility]  [facility]-uninsured. Denied. [facility]. Un[facility]. Den[facility].   Will inform medical staff when bed is available.   Vivi BarrackNicole Wrangler Penning, Theresia MajorsLCSWA, MSW Clinical Social Worker 5E and Psychiatric Service Line [phone] 03/02/2016  9:49 AM

## 2016-03-03 DIAGNOSIS — F339 Major depressive disorder, recurrent, unspecified: Secondary | ICD-10-CM

## 2016-03-03 DIAGNOSIS — E860 Dehydration: Secondary | ICD-10-CM

## 2016-03-03 DIAGNOSIS — Z9114 Patient's other noncompliance with medication regimen: Secondary | ICD-10-CM

## 2016-03-03 DIAGNOSIS — E871 Hypo-osmolality and hyponatremia: Secondary | ICD-10-CM

## 2016-03-03 DIAGNOSIS — R45851 Suicidal ideations: Secondary | ICD-10-CM

## 2016-03-03 DIAGNOSIS — E1100 Type 2 diabetes mellitus with hyperosmolarity without nonketotic hyperglycemic-hyperosmolar coma (NKHHC): Principal | ICD-10-CM

## 2016-03-03 LAB — GLUCOSE, CAPILLARY
GLUCOSE-CAPILLARY: 291 mg/dL — AB (ref 65–99)
GLUCOSE-CAPILLARY: 239 mg/dL — AB (ref 65–99)
GLUCOSE-CAPILLARY: 125 mg/dL — AB (ref 65–99)
Glucose-Capillary: 70 mg/dL (ref 65–99)

## 2016-03-03 MED ORDER — INSULIN ASPART PROT & ASPART (70-30 MIX) 100 UNIT/ML ~~LOC~~ SUSP: SUBCUTANEOUS | 11 refills | Status: DC

## 2016-03-03 MED ORDER — FLUOXETINE HCL 20 MG PO CAPS: 20.0000 mg | ORAL_CAPSULE | Freq: Every day | ORAL | 0 refills | Status: DC

## 2016-03-03 MED ORDER — PREGABALIN 50 MG PO CAPS: 50.0000 mg | ORAL_CAPSULE | Freq: Three times a day (TID) | ORAL | 1 refills | Status: DC

## 2016-03-03 MED ORDER — TRAZODONE HCL 50 MG PO TABS: 50.0000 mg | ORAL_TABLET | Freq: Every day | ORAL | 1 refills | Status: DC

## 2016-03-03 MED ORDER — POLYETHYLENE GLYCOL 3350 17 G PO PACK: 17.0000 g | PACK | Freq: Every day | ORAL | 0 refills | Status: DC

## 2016-03-03 NOTE — Discharge Summary (Signed)
Physician Discharge Summary  Kenneth RainwaterRandy C Quintin ZOX:[id number] DOB: 08/29/1965 DOA: 02/23/2016  PCP: No PCP Per Patient  Admit date: 02/23/2016 Discharge date: 03/03/2016  Time spent: 35 minutes  Recommendations for Outpatient Follow-up:  1. Repeat BMET to follow up electrolytes and renal function  2. Repeat thyroid function in 6 weeks 3. Please reassess CBG's and diabetes regimen; adjust hypoglycemic regimen as needed  4. Arrange for further outpatient psychiatry services  5. Please follow gallbladder polyp as seen on RUQ US during this admission    Discharge Diagnoses:  Principal Problem:   Diabetes mellitus with nonketotic hyperosmolarity (HCC) Active Problems:   Dehydration   Hyperthyroidism   Noncompliance with medication regimen   Hyperglycemia   Abdominal pain   Recurrent major depressive disorder (HCC)   Discharge Condition: stable and improved. Discharge home with assistance form case manager to obtain medications and to establish care with transition clinic for future follow up.  Diet recommendation: modified carbohydrates diet   Filed Weights   02/23/16 1248 02/28/16 1220  Weight: 52.8 kg (116 lb 8 oz) 55.1 kg (121 lb 7.6 oz)    History of present illness:  As per Dr. Caleb PoppNettey H&P written on 02/23/16  Kenneth RainwaterRandy C [name] is a 50 y.o. male with medical history significant of diabetes mellitus type 2, ?seizures, ?thyroid disease. Patient's sister reports she brought the patient to the hospital because of suicidal ideation. He has been feeling this way for weeks. He reports not seeking treatment for his feelings. His plan includes not taking insulin and going into severe hyperglycemia. He reports having nausea with non-bloody, non-bilious vomiting for the past three days, which has worsened. He reports taking insulin when he can whenever someone is willing to give him some, as he does not have access to insulin hisself. He is unsure of what insulin he takes. Stress of his life  situation has made his symptoms aggravated.  Hospital Course:   DM1 -Patient's HbA1c > 13.2 - Severely uncontrolled due to erratic insulin use mainly due to non-compliance with outpt follow up and lack of financial power to acquired medications.  - Discharge on 70/30 BID and advise to follow modified carb diet  - Patient will need further assistance and adjustment on his hypoglycemic regimen at discharge -advise to check CBG's at least twice a day to assist with future adjustment on insulin therapy    Hyponatremia -Due to hyperglycemia most likely -resolved once CBG's under better control  Neuropathy -continue Gabapentin 300 mg TID and low dose Lyrica 50 mg TID -outpatient follow and better control of his diabetes was encouraged  Depression - Appears to be Situational-  - Does not admit to being Suicidal today; - Re-Evaluated with Psychiatry and Patient cleared to be D/C'd Home from their Standpoint - C/w Fluoxetine 20 mg po Daily started by Psychiatry as well as 50 mg po Trazadone QHS  Social Issues- lack of income - Trying to get disability and Medicaid- claims he can't work due to pain -assistance by Case manager provided; discharge on 70/30 insulin to assist with cost -patient will follow up with transition clinic to establish care and continue working on his disability   Right Lower Abdominal Pain -CT Abd/Pelvis showed No definite explanation for patient's mid abdominal pain. Specifically, no evidence of urinary or enteric obstruction. Large colonic stool burden appreciated (see below for treatment/approach). -also 4mm gallbladder polyp appreciated on RUQ US -further follow up, pain medication adjustment and if needed surgical referral to be done as an  outpatient.    Constipation -continue PRN laxatives and has been encouraged to maintain good hydration     ? Hyperthyroid -TSH was 1.373 and Free T4 0.87 -Will need to Monitor again in 6 weeks -patient was on  tapazole at some point; no longer using any thyroid medication   Procedures:  See below for x-ray reports   Consultations:  Diabetes coordinator  Psychiatry   Discharge Exam: Vitals:   03/03/16 0505 03/03/16 1245  BP: 103/69 (!) 90/54  Pulse: 70 60  Resp: 18 16  Temp: 98 F (36.7 C) 98.2 F (36.8 C)    General: afebrile, AAOX3, denies suicidal thoughts, no nausea, no vomiting. Reports LE pain and numbness. Patient appears chronically ill and underweight  Cardiovascular: S1 and S2, no rubs or gallops  Respiratory: CTA bilaterally Abd: soft, ND, positive diffuse dull pain (even mainly on right quadrant); no guarding  Extremities: reported numbness and pain on palpation, no cyanosis or erythema seen on exam   Discharge Instructions   Discharge Instructions    Diet - low sodium heart healthy    Complete by:  As directed    Diet Carb Modified    Complete by:  As directed    Discharge instructions    Complete by:  As directed    Take medications as prescribed Please follow a low carbohydrates diet Maintain yourself well hydrated Follow up with transition clinic as instructed to establish care and received further adjustment on your hypoglycemic regimen. Check your sugar at least twice a day to monitor levels and assist with future adjustment needs.   Increase activity slowly    Complete by:  As directed      Current Discharge Medication List    START taking these medications   Details  FLUoxetine (PROZAC) 20 MG capsule Take 1 capsule (20 mg total) by mouth daily. Qty: 30 capsule, Refills: 0    gabapentin (NEURONTIN) 300 MG capsule Take 1 capsule (300 mg total) by mouth 3 (three) times daily. Qty: 90 capsule, Refills: 2    insulin NPH-regular Human (NOVOLIN 70/30) (70-30) 100 UNIT/ML injection Inject 10 Units into the skin 2 (two) times daily with a meal. Start taking this when you run out of your current insulin Qty: 10 mL, Refills: 11    polyethylene glycol  (MIRALAX / GLYCOLAX) packet Take 17 g by mouth daily. Hold for diarrhea Qty: 28 each, Refills: 0    pregabalin (LYRICA) 50 MG capsule Take 1 capsule (50 mg total) by mouth 3 (three) times daily. Qty: 90 capsule, Refills: 1    traZODone (DESYREL) 50 MG tablet Take 1 tablet (50 mg total) by mouth at bedtime. Qty: 30 tablet, Refills: 1      CONTINUE these medications which have CHANGED   Details  !! insulin aspart protamine- aspart (NOVOLOG MIX 70/30) (70-30) 100 UNIT/ML injection Inject 0.1 mLs (10 Units total) into the skin 3 (three) times daily with meals as needed. Qty: 10 mL, Refills: 11    !! insulin aspart protamine- aspart (NOVOLOG MIX 70/30) (70-30) 100 UNIT/ML injection Please inject twice a day for sugar control; 24 units in am and 15 units at bedtime. Qty: 10 mL, Refills: 11     !! - Potential duplicate medications found. Please discuss with provider.    STOP taking these medications     insulin NPH Human (HUMULIN N,NOVOLIN N) 100 UNIT/ML injection        No Known Allergies Follow-up Information    [facility] Follow up.   Why:  call to make appointment for financial screening and MD appointment. Contact information: [address] [place] [address] [phone]       [facility] Follow up on 03/25/2016.   Why:  Please keep appointment. on 03/25/2016 at 9:00 AM.  Take all medications with you.  Please call [phone] to cancel appointment.  Contact information: [address] [place] [address]          The results of significant diagnostics from this hospitalization (including imaging, microbiology, ancillary and laboratory) are listed below for reference.    Significant Diagnostic Studies: Dg Chest 2 View  Result Date: 02/23/2016 CLINICAL DATA:  Cough, chest congestion, chest pain, and shortness of breath began this morning. Current smoker. History of seizures and diabetes EXAM:  CHEST  2 VIEW COMPARISON:  Portable chest x-ray of December 21, 2015 FINDINGS: The lungs remain mildly hyperinflated. There is no focal infiltrate. There is no pleural effusion or pneumothorax. The heart and pulmonary vascularity are normal. The mediastinum is normal in width. The observed bony thorax exhibits no acute abnormality. Mild hyperinflation consistent with the patient's smoking history. No pneumonia, CHF, nor other acute cardiopulmonary abnormality. IMPRESSION: No active cardiopulmonary disease. Electronically Signed   By: David  [place] M.D.   On: 02/23/2016 11:11   Ct Abdomen Pelvis W Contrast  Result Date: 02/29/2016 CLINICAL DATA:  Mid abdominal pain for the past 2-3 days. EXAM: CT ABDOMEN AND PELVIS WITH CONTRAST TECHNIQUE: Multidetector CT imaging of the abdomen and pelvis was performed using the standard protocol following bolus administration of intravenous contrast. CONTRAST:  ISOVUE-300 IOPAMIDOL (ISOVUE-300) INJECTION 61% COMPARISON:  Right upper quadrant abdominal ultrasound - 02/24/2016 FINDINGS: Lower chest: Limited visualization of the lower thorax demonstrates ill-defined nodular opacities within the imaged left lower lobe which appear to demonstrate a tree-in-bud configuration (representative images 1 in 4, series 4). Minimal dependent subpleural ground-glass atelectasis within the right lower lobe. Normal heart size.  No pericardial effusion. Hepatobiliary: Normal hepatic contour. No discrete hepatic lesions. Normal appearance of the gallbladder given degree distention. No radiopaque gallstones. No intra extrahepatic bili duct dilatation. No ascites. Pancreas: Normal appearance of the pancreas. Spleen: Normal appearance of the spleen Adrenals/Urinary Tract: There is symmetric enhancement and excretion of the bilateral kidneys. No definite renal stones on this postcontrast examination. No discrete renal lesions. Note is made of an extrarenal left renal pelvis. No definite evidence  urinary obstruction. There is mild thickening of the left adrenal gland without discrete nodule. Normal appearance of the right adrenal gland. Normal appearance of the urinary bladder given degree distention. Stomach/Bowel: Ingested enteric contrast extensive the level of the distal small bowel. Large colonic stool burden without evidence of enteric obstruction. Normal appearance of the terminal ileum. The appendix is not visualized, however there is no pericecal inflammatory change. No pneumoperitoneum, pneumatosis or portal venous gas. Vascular/Lymphatic: Minimal amount of atherosclerotic plaque within a normal caliber abdominal aorta. The major branch vessels of the abdominal aorta appear widely patent on this non CTA examination. Note is made of a retroaortic left renal vein. No bulky retroperitoneal, mesenteric, pelvic or inguinal lymphadenopathy. Reproductive: Normal appearance of the pelvic organs. No free fluid in the pelvic cul-de-sac. Other: Subcutaneous edema within the ventral right lower abdominal body wall likely at the location of subcutaneous medication administration. Musculoskeletal: No acute or aggressive osseous abnormalities. 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  No definite explanation for patient's mid abdominal pain. Specifically, no evidence of urinary or enteric obstruction. 2. Large colonic stool burden. 3. Ill-defined apparent tree-in-bud nodular opacities within the imaged left lower lobe, nonspecific though potentially the sequela of infection (including atypical etiologies) inter aspiration. Clinical correlation is advised. 4.  Aortic Atherosclerosis (ICD10-170.0) Electronically Signed   By: Simonne Come M.D.   On: 02/29/2016 14:12   US Abdomen Limited Ruq  Result Date: 02/24/2016 CLINICAL DATA:  Right upper quadrant tenderness. EXAM: US ABDOMEN LIMITED - RIGHT UPPER QUADRANT COMPARISON:  [facility] 09/16/2012. FINDINGS: Gallbladder: Tiny gallbladder  polyps measuring up to 4 mm noted. No gallstones noted. Gallbladder wall thickness normal. Negative Murphy sign. Common bile duct: Diameter: 4.5 mm Liver: No focal lesion identified. Within normal limits in parenchymal echogenicity. IMPRESSION: Tiny gallbladder polyps measuring up to 4 mm. Exam is otherwise unremarkable. Electronically Signed   By: Maisie Fus  Register   On: 02/24/2016 08:59   Labs: Basic Metabolic Panel:  Recent Labs Lab 02/27/16 0356 02/28/16 0840  02/29/16 1610  02/29/16 2210 03/01/16 0232 03/01/16 0602 03/01/16 0921 03/02/16 0502  NA 137 129*  --  137  --   --   --   --  132* 137  K 4.2 6.6*  < > 4.6  < > 5.9* 4.0 4.5 4.1 4.8  CL 101 92*  --  100*  --   --   --   --  97* 102  CO2 32 30  --  31  --   --   --   --  26 31  GLUCOSE 211* 579*  --  149*  --   --   --   --  434* 243*  BUN 15 16  --  15  --   --   --   --  14 14  CREATININE 0.70 0.81  --  0.65  --   --  0.48*  --  0.88 0.66  CALCIUM 8.7* 8.7*  --  8.5*  --   --   --   --  8.7* 8.6*  MG 1.9 1.6*  --  1.8  --   --   --   --  1.7 2.0  PHOS 4.6 4.2  --  3.7  --   --   --   --  2.3* 3.5  < > = values in this interval not displayed.   Liver Function Tests:  Recent Labs Lab 02/27/16 0356 02/28/16 0840 02/29/16 0547 03/01/16 0921 03/02/16 0502  AST 17 24 26  40 35  ALT 16* 20 19 27 27   ALKPHOS 63 59 53 64 58  BILITOT 0.3 0.6 0.2* 0.5 0.4  PROT 5.4* 6.2* 5.7* 6.1* 5.9*  ALBUMIN 3.2* 3.4* 3.1* 3.3* 3.2*   CBC:  Recent Labs Lab 02/27/16 0356 02/28/16 0840 02/29/16 0547 03/01/16 0921 03/02/16 0502  WBC 5.7 5.6 5.9 6.5 7.6  NEUTROABS 2.9 3.5 2.7 4.1 4.0  HGB 12.6* 13.2 12.3* 12.4* 12.2*  HCT 36.2* 39.2 36.2* 36.5* 34.6*  MCV 93.5 95.1 94.5 95.1 91.1  PLT 144* 157 175 192 200    CBG:  Recent Labs Lab 03/02/16 1132 03/02/16 1650 03/02/16 2208 03/03/16 0736 03/03/16 1147  GLUCAP 257* 111* 143* 291* 239*    Signed:  Vassie Loll MD.  [facility] 03/03/2016, 4:04 PM

## 2016-03-03 NOTE — Progress Notes (Signed)
Discharge instructions and prescriptions reviewed with patient ultilzing teach back method. Patient discharged to home via taxi with voucher

## 2016-03-03 NOTE — Progress Notes (Signed)
LCSWA spoke with patient sister-[name] over phone. She expressed concern with patient going home as he feels he did not receive help, and is not ready to go home. LCSWA explained to patient sister patient has been medically cleared by medical and psych. Physician. She insisted LCSWA meet with patient. LCSWA met with patient at bedside and inquired about "not receiving help" the patient stated, "I do not want to go home because I do not have anyone at home. There are people here at the hospital." LCSWA explained we could not hold him in the hospital for that reason. Patient expressed he needed help before going home because he did not want to beg for help, he would rather be dead. LCSWA informed RNCM. Patient was set up [facility] services and social worker to assist with his care. Patient more relaxed and calm. Agreeable to go home. Patient given a taxi voucher to home in [place].  LCSWA informed patient sister-[name]. No other needs identified.   Kathrin Greathouse, Latanya Presser, MSW Clinical Social Worker 5E and Psychiatric Service Line [phone] 03/03/2016  4:55 PM

## 2016-03-03 NOTE — Progress Notes (Signed)
Inpatient Diabetes Program Recommendations  AACE/ADA: New Consensus Statement on Inpatient Glycemic Control (2015)  Target Ranges:  Prepandial:   less than 140 mg/dL      Peak postprandial:   less than 180 mg/dL (1-2 hours)      Critically ill patients:  140 - 180 mg/dL    Results for Kenneth [name], Unknown [name] (MRN [id number]) as of 03/03/2016 13:23  Ref. Range 07/24/2014 08:21 02/23/2016 13:39  Hemoglobin A1C Latest Ref Range: 4.8 - 5.6 % 11.6 (H) 13.2 (H)  Significantly worsened   Results for Kenneth [name], Kenneth [name] (MRN [id number]) as of 03/03/2016 13:23  Ref. Range 03/02/2016 11:32 03/02/2016 16:50 03/02/2016 22:08 03/03/2016 07:36 03/03/2016 11:47  Glucose-Capillary Latest Ref Range: 65 - 99 mg/dL 782257 (H) 956111 (H) 213143 (H) 291 (H) 239 (H)   Review of Glycemic Control  Per Jeanine Fishel's notes (Inpatient Diabetes Program Diabetes Coordinator) on 12/05 and 12/06, noted the following information:  "Admit with: Hyperglycemia/ Suicidal(no money to buy insulin)  History: DM  Home DM Meds: 70/30 Insulin (unknown exact dosages- per Home Med Rec, patient relies on using friends' insulin and gives himself insulin based on how he feels)  Note Care management consulted. Already taking the least expensive insulin at home. Reli-on brand 70/30 insulin from [facility] is $25 per vial (Order # [id number]).  Note also patient and his family were given lists of multiple free/low cost clinics in the area."  Inpatient Diabetes Program Recommendations:      Please consider limiting shakes to one per administration only 3 times per day and restricting snacks to improve glycemic control.  Per RD consultation note, patient is eating very well, almost excessively, and ordering multiple snacks and meals while inpatient. Patient has been offered multiple low to no cost services.  Thank you,  Kristine LineaKaren Leul Narramore, RN, BSN Diabetes Coordinator Inpatient Diabetes Program [phone] (Team Pager)

## 2016-03-03 NOTE — Progress Notes (Signed)
Pt's sister [name] called, explained [facility], and MATCH program to her.  [name] states no one can come pick pt up, no one have a car.  Pt was relaxed when telling him that a HHRN/HHSW would come into his home to help him.  Referral given to in house rep with [facility].

## 2016-03-03 NOTE — Progress Notes (Signed)
[facility]ointment [facility] Mar 25, 2016 at 9 AM was made for pt.  [facility] but he did not use it. Pt understands that this is once in one year use with a $3 co-pay for each medication. Pt states he understands/teachback.

## 2016-03-04 NOTE — Progress Notes (Signed)
Pt placed in HRI program with [facility].  Pt was called to check on him and given this information that [facility] will be in to see him within the next 48 hrs.

## 2016-03-25 ENCOUNTER — Encounter (HOSPITAL_COMMUNITY): Payer: Self-pay | Admitting: Emergency Medicine

## 2016-03-25 ENCOUNTER — Encounter: Payer: Self-pay | Admitting: Family Medicine

## 2016-03-25 ENCOUNTER — Ambulatory Visit (INDEPENDENT_AMBULATORY_CARE_PROVIDER_SITE_OTHER): Payer: Medicaid Other | Admitting: Family Medicine

## 2016-03-25 ENCOUNTER — Ambulatory Visit (HOSPITAL_COMMUNITY)
Admission: RE | Admit: 2016-03-25 | Discharge: 2016-03-25 | Disposition: A | Discharge status: Home / Self Care | Payer: Self-pay | Source: Ambulatory Visit | Attending: Family Medicine | Admitting: Family Medicine

## 2016-03-25 ENCOUNTER — Emergency Department (HOSPITAL_COMMUNITY)
Admission: EM | Admit: 2016-03-25 | Discharge: 2016-03-26 | Disposition: A | Discharge status: Other Acute Inpatient Hospital | Payer: Medicaid Other | Attending: Emergency Medicine | Admitting: Emergency Medicine

## 2016-03-25 VITALS — BP 111/67 | HR 72 | Temp 98.2°F | Resp 12 | Ht 69.0 in | Wt 119.0 lb

## 2016-03-25 DIAGNOSIS — F339 Major depressive disorder, recurrent, unspecified: Secondary | ICD-10-CM | POA: Diagnosis present

## 2016-03-25 DIAGNOSIS — E118 Type 2 diabetes mellitus with unspecified complications: Secondary | ICD-10-CM

## 2016-03-25 DIAGNOSIS — R739 Hyperglycemia, unspecified: Secondary | ICD-10-CM | POA: Diagnosis not present

## 2016-03-25 DIAGNOSIS — F331 Major depressive disorder, recurrent, moderate: Secondary | ICD-10-CM | POA: Diagnosis not present

## 2016-03-25 DIAGNOSIS — R634 Abnormal weight loss: Secondary | ICD-10-CM

## 2016-03-25 DIAGNOSIS — K824 Cholesterolosis of gallbladder: Secondary | ICD-10-CM | POA: Diagnosis not present

## 2016-03-25 DIAGNOSIS — R45851 Suicidal ideations: Secondary | ICD-10-CM | POA: Diagnosis not present

## 2016-03-25 DIAGNOSIS — E119 Type 2 diabetes mellitus without complications: Secondary | ICD-10-CM | POA: Insufficient documentation

## 2016-03-25 DIAGNOSIS — G629 Polyneuropathy, unspecified: Secondary | ICD-10-CM | POA: Diagnosis not present

## 2016-03-25 DIAGNOSIS — Z79899 Other long term (current) drug therapy: Secondary | ICD-10-CM | POA: Insufficient documentation

## 2016-03-25 DIAGNOSIS — F17200 Nicotine dependence, unspecified, uncomplicated: Secondary | ICD-10-CM | POA: Insufficient documentation

## 2016-03-25 DIAGNOSIS — R64 Cachexia: Secondary | ICD-10-CM

## 2016-03-25 DIAGNOSIS — K829 Disease of gallbladder, unspecified: Secondary | ICD-10-CM

## 2016-03-25 DIAGNOSIS — Z794 Long term (current) use of insulin: Secondary | ICD-10-CM

## 2016-03-25 LAB — COMPREHENSIVE METABOLIC PANEL
ALK PHOS: 59 U/L (ref 38–126)
ALT: 17 U/L (ref 17–63)
AST: 16 U/L (ref 15–41)
Albumin: 3.9 g/dL (ref 3.5–5.0)
Anion gap: 8 (ref 5–15)
BUN: 10 mg/dL (ref 6–20)
CALCIUM: 8.6 mg/dL — AB (ref 8.9–10.3)
CO2: 25 mmol/L (ref 22–32)
CREATININE: 0.64 mg/dL (ref 0.61–1.24)
Chloride: 104 mmol/L (ref 101–111)
GFR calc Af Amer: >60 mL/min (ref >60)
GFR calc non Af Amer: >60 mL/min (ref >60)
GLUCOSE: 80 mg/dL (ref 65–99)
Potassium: 3.2 mmol/L — ABNORMAL LOW (ref 3.5–5.1)
SODIUM: 137 mmol/L (ref 135–145)
Total Bilirubin: 0.3 mg/dL (ref 0.3–1.2)
Total Protein: 6.2 g/dL — ABNORMAL LOW (ref 6.5–8.1)

## 2016-03-25 LAB — CBC
HEMATOCRIT: 38 % — AB (ref 39.0–52.0)
HEMOGLOBIN: 12.6 g/dL — AB (ref 13.0–17.0)
MCH: 29.3 pg (ref 26.0–34.0)
MCHC: 33.2 g/dL (ref 30.0–36.0)
MCV: 88.4 fL (ref 78.0–100.0)
Platelets: 175 10*3/uL (ref 150–400)
RBC: 4.3 MIL/uL (ref 4.22–5.81)
RDW: 13 % (ref 11.5–15.5)
WBC: 8.3 10*3/uL (ref 4.0–10.5)

## 2016-03-25 LAB — GLUCOSE, POCT (MANUAL RESULT ENTRY): POC Glucose: 404 mg/dl — AB (ref 70–99)

## 2016-03-25 LAB — COMPLETE METABOLIC PANEL WITH GFR
ALT: 15 U/L (ref 9–46)
AST: 14 U/L (ref 10–35)
Albumin: 4.4 g/dL (ref 3.6–5.1)
Alkaline Phosphatase: 79 U/L (ref 40–115)
BUN: 9 mg/dL (ref 7–25)
CALCIUM: 9.7 mg/dL (ref 8.6–10.3)
CHLORIDE: 97 mmol/L — AB (ref 98–110)
CO2: 20 mmol/L (ref 20–31)
CREATININE: 0.84 mg/dL (ref 0.70–1.33)
GFR, Est African American: >89 mL/min (ref >=60)
GFR, Est Non African American: >89 mL/min (ref >=60)
Glucose, Bld: 425 mg/dL — ABNORMAL HIGH (ref 65–99)
Potassium: 4.7 mmol/L (ref 3.5–5.3)
Sodium: 135 mmol/L (ref 135–146)
Total Bilirubin: 0.4 mg/dL (ref 0.2–1.2)
Total Protein: 7.1 g/dL (ref 6.1–8.1)

## 2016-03-25 LAB — RAPID URINE DRUG SCREEN, HOSP PERFORMED
AMPHETAMINES: NOT DETECTED
BARBITURATES: NOT DETECTED
Benzodiazepines: NOT DETECTED
Cocaine: POSITIVE — AB
Opiates: NOT DETECTED
TETRAHYDROCANNABINOL: NOT DETECTED

## 2016-03-25 LAB — TSH: TSH: 1.04 mIU/L (ref 0.40–4.50)

## 2016-03-25 LAB — CBG MONITORING, ED: GLUCOSE-CAPILLARY: 75 mg/dL (ref 65–99)

## 2016-03-25 LAB — ACETAMINOPHEN LEVEL: Acetaminophen (Tylenol), Serum: <10 ug/mL — ABNORMAL LOW (ref 10–30)

## 2016-03-25 LAB — GLUCOSE, CAPILLARY
GLUCOSE-CAPILLARY: 404 mg/dL — AB (ref 65–99)
GLUCOSE-CAPILLARY: 250 mg/dL — AB (ref 65–99)
Glucose-Capillary: 317 mg/dL — ABNORMAL HIGH (ref 65–99)

## 2016-03-25 LAB — ETHANOL: Alcohol, Ethyl (B): <5 mg/dL (ref <5)

## 2016-03-25 LAB — SALICYLATE LEVEL: Salicylate Lvl: <7 mg/dL (ref 2.8–30.0)

## 2016-03-25 MED ORDER — INSULIN ASPART PROT & ASPART (70-30 MIX) 100 UNIT/ML ~~LOC~~ SUSP: 14.0000 [IU] | Freq: Once | SUBCUTANEOUS | Status: DC

## 2016-03-25 MED ORDER — INSULIN ASPART PROT & ASPART (70-30 MIX) 100 UNIT/ML ~~LOC~~ SUSP: 24.0000 [IU] | Freq: Every day | SUBCUTANEOUS | Status: DC

## 2016-03-25 MED ORDER — INSULIN ASPART 100 UNIT/ML IV SOLN: INTRAVENOUS | 12 refills | Status: DC

## 2016-03-25 MED ORDER — POTASSIUM CHLORIDE CRYS ER 20 MEQ PO TBCR
20.0000 meq | EXTENDED_RELEASE_TABLET | Freq: Once | ORAL | Status: AC
Start: 2016-03-25 — End: 2016-03-26
  Administered 2016-03-26: 20 meq via ORAL
  Filled 2016-03-25: qty 2

## 2016-03-25 MED ORDER — INSULIN GLARGINE 100 UNIT/ML ~~LOC~~ SOLN
10.0000 [IU] | Freq: Every day | SUBCUTANEOUS | Status: DC
  Filled 2016-03-25: qty 0.1

## 2016-03-25 MED ORDER — GLUCOSE BLOOD VI STRP
ORAL_STRIP | 12 refills | Status: DC
Start: 2016-03-25 — End: 2018-05-02

## 2016-03-25 MED ORDER — FLUOXETINE HCL 20 MG PO CAPS: 20.0000 mg | ORAL_CAPSULE | Freq: Every day | ORAL | 2 refills | Status: DC

## 2016-03-25 MED ORDER — TRUE METRIX AIR GLUCOSE METER W/DEVICE KIT: 1.0000 | PACK | Freq: Three times a day (TID) | 0 refills | Status: DC

## 2016-03-25 MED ORDER — INSULIN GLARGINE 100 UNIT/ML ~~LOC~~ SOLN: 10.0000 [IU] | Freq: Every day | SUBCUTANEOUS | 11 refills | Status: DC

## 2016-03-25 MED ORDER — INSULIN SYRINGES (DISPOSABLE) U-100 0.5 ML MISC: 1.0000 | Freq: Three times a day (TID) | 5 refills | Status: DC

## 2016-03-25 MED ORDER — SODIUM CHLORIDE 0.9 % IV BOLUS (SEPSIS)
500.0000 mL | Freq: Once | INTRAVENOUS | Status: AC
  Administered 2016-03-25: 500 mL via INTRAVENOUS

## 2016-03-25 MED ORDER — GABAPENTIN 300 MG PO CAPS
300.0000 mg | ORAL_CAPSULE | Freq: Three times a day (TID) | ORAL | Status: DC
  Administered 2016-03-26 (×2): 300 mg via ORAL
  Filled 2016-03-25 (×2): qty 1

## 2016-03-25 MED ORDER — INSULIN REGULAR HUMAN 100 UNIT/ML IJ SOLN
10.0000 [IU] | Freq: Once | INTRAMUSCULAR | Status: AC
  Administered 2016-03-25: 10 [IU] via SUBCUTANEOUS

## 2016-03-25 MED ORDER — GABAPENTIN 300 MG PO CAPS: 300.0000 mg | ORAL_CAPSULE | Freq: Three times a day (TID) | ORAL | 2 refills | Status: DC

## 2016-03-25 MED ORDER — FLUOXETINE HCL 20 MG PO CAPS
20.0000 mg | ORAL_CAPSULE | Freq: Every day | ORAL | Status: DC
  Administered 2016-03-26: 20 mg via ORAL
  Filled 2016-03-25: qty 1

## 2016-03-25 MED ORDER — INSULIN ASPART 100 UNIT/ML ~~LOC~~ SOLN
0.0000 [IU] | Freq: Three times a day (TID) | SUBCUTANEOUS | Status: DC
  Administered 2016-03-26: 9 [IU] via SUBCUTANEOUS
  Administered 2016-03-26: 7 [IU] via SUBCUTANEOUS
  Administered 2016-03-26: 3 [IU] via SUBCUTANEOUS
  Filled 2016-03-25: qty 1

## 2016-03-25 MED ORDER — ACETAMINOPHEN-CODEINE #3 300-30 MG PO TABS: 1.0000 | ORAL_TABLET | Freq: Four times a day (QID) | ORAL | 0 refills | Status: DC | PRN

## 2016-03-25 MED FILL — TRUE METRIX BLOOD GLUCOSE M: W/DEVICE | 1 days supply | Qty: 1 | Fill #0

## 2016-03-25 MED FILL — TRUE METRIX TEST STRIP: 30 days supply | Qty: 100 | Fill #0

## 2016-03-25 MED FILL — TRUEPLUS SYR 1ML 30GX5/16: 30G X 5/16" | 25 days supply | Qty: 100 | Fill #0

## 2016-03-25 NOTE — Addendum Note (Signed)
Addended by: Massie MaroonHOLLIS, Runell Kovich M on: 03/25/2016 03:29 PM   Modules accepted: Orders

## 2016-03-25 NOTE — ED Notes (Signed)
[facility]. Patient denies HI and AVH at this time. Plan of care discussed with patient. Patient verbalize understanding. Encouragement and support provided and safety maintain.

## 2016-03-25 NOTE — ED Provider Notes (Signed)
[facility] DEPT Provider Note   CSN: [id number] Arrival date & time: 03/25/16  1248     History   Chief Complaint Chief Complaint  Patient presents with  . Suicidal    HPI Kenneth [name] is a 51 y.o. male.  The history is provided by the patient. No language interpreter was used.    Kenneth [name] is a 51 y.o. male who presents to the Emergency Department complaining of SI.  He presents voluntarily for psychiatric evaluation.  He states he cannot go on living like this and does not know what to do.  He feels poorly in general and has felt this way for months and does not know how to take care of himself.  He reports vague SI and is concerned what he might do but cannot express a plan.  He reports hearing voices that are yelling all the time but does not know what they say.  He lives at home alone and is concerned for his safety.  He was brought in by his sister voluntarily.  No fevers, chest pain, sob, abdominal pain.  He does have occasional vomiting that is an ongoing problem for him.    Past Medical History:  Diagnosis Date  . Arthritis   . Diabetes mellitus   . DKA (diabetic ketoacidosis) ([place]) 07/2014  . Malnutrition ([place])   . Noncompliance with medication regimen   . Thyroid disease     Patient Active Problem List   Diagnosis Date Noted  . Recurrent major depressive disorder ([place])   . Abdominal pain   . Hyperglycemia 02/24/2016  . Volume depletion 12/21/2015  . Metabolic acidosis 16/96/7893  . Diabetes mellitus with nonketotic hyperosmolarity ([place]) 09/21/2014  . Noncompliance with medication regimen   . Diabetes mellitus ([place])   . DKA (diabetic ketoacidoses) ([place]) 07/24/2014  . Dehydration 07/24/2014  . Acute renal failure ([place]) 07/24/2014  . Hyperkalemia 07/24/2014  . Hyponatremia 07/24/2014  . Hyperthyroidism 07/24/2014    History reviewed. No pertinent surgical history.     Home Medications    Prior to Admission medications   Medication Sig Start  Date End Date Taking? Authorizing Provider  Blood Glucose Monitoring Suppl (TRUE METRIX AIR GLUCOSE METER) w/Device KIT 1 each by Does not apply route 4 (four) times daily -  before meals and at bedtime. 03/25/16  Yes Dorena Dew, FNP  FLUoxetine (PROZAC) 20 MG capsule Take 1 capsule (20 mg total) by mouth daily. 03/25/16  Yes Dorena Dew, FNP  gabapentin (NEURONTIN) 300 MG capsule Take 1 capsule (300 mg total) by mouth 3 (three) times daily. 03/25/16  Yes Dorena Dew, FNP  glucose blood (TRUE METRIX BLOOD GLUCOSE TEST) test strip Use as instructed 03/25/16  Yes Dorena Dew, FNP  Insulin Syringes, Disposable, U-100 0.5 ML MISC 1 each by Does not apply route 4 (four) times daily -  before meals and at bedtime. 03/25/16  Yes Dorena Dew, FNP  acetaminophen-codeine (TYLENOL #3) 300-30 MG tablet Take 1 tablet by mouth every 6 (six) hours as needed for moderate pain. Patient not taking: Reported on 03/25/2016 03/25/16   Dorena Dew, FNP  insulin aspart (NOVOLOG) 100 unit/mL injection Per sliding scale 03/25/16   Dorena Dew, FNP  insulin glargine (LANTUS) 100 UNIT/ML injection Inject 0.1 mLs (10 Units total) into the skin at bedtime. 03/25/16   Dorena Dew, FNP  insulin NPH-regular Human (NOVOLIN 70/30) (70-30) 100 UNIT/ML injection Inject 10 Units into the skin 2 (two)  times daily with a meal. Start taking this when you run out of your current insulin 02/24/16   Debbe Odea, MD    Family History Family History  Problem Relation Age of Onset  . Diabetes Mother   . Seizures Sister   . Diabetes Sister     Social History Social History  Substance Use Topics  . Smoking status: Current Every Day Smoker    Packs/day: 0.50    Years: 8.00  . Smokeless tobacco: Never Used  . Alcohol use No     Allergies   Patient has no known allergies.   Review of Systems Review of Systems  All other systems reviewed and are negative.    Physical Exam Updated Vital Signs BP 119/77  (BP Location: Left Arm)   Pulse 62   Temp 98.1 F (36.7 C) (Oral)   Resp 18   Ht 5' 9" (1.753 m)   Wt 126 lb (57.2 kg)   SpO2 99%   BMI 18.61 kg/m   Physical Exam  Constitutional: He is oriented to person, place, and time. He appears well-developed and well-nourished.  HENT:  Head: Normocephalic and atraumatic.  Cardiovascular: Normal rate and regular rhythm.   Pulmonary/Chest: Effort normal. No respiratory distress.  Musculoskeletal: Normal range of motion.  Neurological: He is alert and oriented to person, place, and time.  Skin: Skin is warm.  Psychiatric:  Depressed mood and affect  Nursing note and vitals reviewed.    ED Treatments / Results  Labs (all labs ordered are listed, but only abnormal results are displayed) Labs Reviewed  COMPREHENSIVE METABOLIC PANEL - Abnormal; Notable for the following:       Result Value   Potassium 3.2 (*)    Calcium 8.6 (*)    Total Protein 6.2 (*)    All other components within normal limits  ACETAMINOPHEN LEVEL - Abnormal; Notable for the following:    Acetaminophen (Tylenol), Serum <10 (*)    All other components within normal limits  CBC - Abnormal; Notable for the following:    Hemoglobin 12.6 (*)    HCT 38.0 (*)    All other components within normal limits  ETHANOL  SALICYLATE LEVEL  RAPID URINE DRUG SCREEN, HOSP PERFORMED  CBG MONITORING, ED    EKG  EKG Interpretation None       Radiology No results found.  Procedures Procedures (including critical care time)  Medications Ordered in ED Medications - No data to display   Initial Impression / Assessment and Plan / ED Course  I have reviewed the triage vital signs and the nursing notes.  Pertinent labs & imaging results that were available during my care of the patient were reviewed by me and considered in my medical decision making (see chart for details).  Clinical Course     Patient with history of diabetes here with feeling depressed and passive  suicidal ideations. He has been medically cleared for psychiatric evaluation and treatment.  Final Clinical Impressions(s) / ED Diagnoses   Final diagnoses:  Suicidal ideation    New Prescriptions New Prescriptions   No medications on file     Quintella Reichert, MD 03/26/16 [phone]

## 2016-03-25 NOTE — Patient Instructions (Addendum)
1, Will check blood sugars before meals and at bedtime Start Novolog per sliding scale Blood sugar <150; no insulin 150-200= 2 units 200-249= 4 units 250-299= 6 units 300-349 = 8 units Greater than 350= 10 units  2. Will continue Lantus 10 units before bed  3. Will send a referral for workup and evaluation of gall bladder polyp. Will start Tylenol # 3 for increased generalized and right upper quadrant abdominal pain  4. Will transition to day infusion center, blood sugar is greater than 400. To receive a fluid bolus and 10 units of insulin.   5. Will continue Prozac 20 mg for manic depression. Patient to walk in to [facility] for depression and/or hallucinations, suicidal thoughts or ideations.  [address] [place], [place] [phone]  6. Given written information on [employer]  Diabetes Mellitus and Food It is important for you to manage your blood sugar (glucose) level. Your blood glucose level can be greatly affected by what you eat. Eating healthier foods in the appropriate amounts throughout the day at about the same time each day will help you control your blood glucose level. It can also help slow or prevent worsening of your diabetes mellitus. Healthy eating may even help you improve the level of your blood pressure and reach or maintain a healthy weight. General recommendations for healthful eating and cooking habits include:  Eating meals and snacks regularly. Avoid going long periods of time without eating to lose weight.  Eating a diet that consists mainly of plant-based foods, such as fruits, vegetables, nuts, legumes, and whole grains.  Using low-heat cooking methods, such as baking, instead of high-heat cooking methods, such as deep frying. Work with your dietitian to make sure you understand how to use the Nutrition Facts information on food labels. How can food affect me? Carbohydrates  Carbohydrates affect  your blood glucose level more than any other type of food. Your dietitian will help you determine how many carbohydrates to eat at each meal and teach you how to count carbohydrates. Counting carbohydrates is important to keep your blood glucose at a healthy level, especially if you are using insulin or taking certain medicines for diabetes mellitus. Alcohol  Alcohol can cause sudden decreases in blood glucose (hypoglycemia), especially if you use insulin or take certain medicines for diabetes mellitus. Hypoglycemia can be a life-threatening condition. Symptoms of hypoglycemia (sleepiness, dizziness, and disorientation) are similar to symptoms of having too much alcohol. If your health care provider has given you approval to drink alcohol, do so in moderation and use the following guidelines:  Women should not have more than one drink per day, and men should not have more than two drinks per day. One drink is equal to:  12 oz of beer.  5 oz of wine.  1 oz of hard liquor.  Do not drink on an empty stomach.  Keep yourself hydrated. Have water, diet soda, or unsweetened iced tea.  Regular soda, juice, and other mixers might contain a lot of carbohydrates and should be counted. What foods are not recommended? As you make food choices, it is important to remember that all foods are not the same. Some foods have fewer nutrients per serving than other foods, even though they might have the same number of calories or carbohydrates. It is difficult to get your body what it needs when you eat foods with fewer nutrients. Examples of foods that you should avoid that are high in  calories and carbohydrates but low in nutrients include:  Trans fats (most processed foods list trans fats on the Nutrition Facts label).  Regular soda.  Juice.  Candy.  Sweets, such as cake, pie, doughnuts, and cookies.  Fried foods. What foods can I eat? Eat nutrient-rich foods, which will nourish your body and keep  you healthy. The food you should eat also will depend on several factors, including:  The calories you need.  The medicines you take.  Your weight.  Your blood glucose level.  Your blood pressure level.  Your cholesterol level. You should eat a variety of foods, including:  Protein.  Lean cuts of meat.  Proteins low in saturated fats, such as fish, egg whites, and beans. Avoid processed meats.  Fruits and vegetables.  Fruits and vegetables that may help control blood glucose levels, such as apples, mangoes, and yams.  Dairy products.  Choose fat-free or low-fat dairy products, such as milk, yogurt, and cheese.  Grains, bread, pasta, and rice.  Choose whole grain products, such as multigrain bread, whole oats, and brown rice. These foods may help control blood pressure.  Fats.  Foods containing healthful fats, such as nuts, avocado, olive oil, canola oil, and fish. Does everyone with diabetes mellitus have the same meal plan? Because every person with diabetes mellitus is different, there is not one meal plan that works for everyone. It is very important that you meet with a dietitian who will help you create a meal plan that is just right for you. This information is not intended to replace advice given to you by your health care provider. Make sure you discuss any questions you have with your health care provider. Document Released: 12/03/2004 Document Revised: 08/14/2015 Document Reviewed: 02/02/2013 Elsevier Interactive Patient Education  2017 Elsevier Inc.  Hyperglycemia Hyperglycemia occurs when the level of sugar (glucose) in the blood is too high. Glucose is a type of sugar that provides the body's main source of energy. Certain hormones (insulin and glucagon) control the level of glucose in the blood. Insulin lowers blood glucose, and glucagon increases blood glucose. Hyperglycemia can result from having too little insulin in the bloodstream, or from the body not  responding normally to insulin. Hyperglycemia occurs most often in people who have diabetes (diabetes mellitus), but it can happen in people who do not have diabetes. It can develop quickly, and it can be life-threatening if it causes you to become severely dehydrated (diabetic ketoacidosis or hyperglycemic hyperosmolar state). Severe hyperglycemia is a medical emergency. What are the causes? If you have diabetes, hyperglycemia may be caused by:  Diabetes medicine.  Medicines that increase blood glucose or affect your diabetes control.  Not eating enough, or not eating often enough.  Changes in physical activity level.  Being sick or having an infection. If you have prediabetes or undiagnosed diabetes:  Hyperglycemia may be caused by those conditions. If you do not have diabetes, hyperglycemia may be caused by:  Certain medicines, including steroid medicines, beta-blockers, epinephrine, and thiazide diuretics.  Stress.  Serious illness.  Surgery.  Diseases of the pancreas.  Infection. What increases the risk? Hyperglycemia is more likely to develop in people who have risk factors for diabetes, such as:  Having a family member with diabetes.  Having a gene for type 1 diabetes that is passed from parent to child (inherited).  Living in an area with cold weather conditions.  Exposure to certain viruses.  Certain conditions in which the body's disease-fighting (immune) system attacks  itself (autoimmune disorders).  Being overweight or obese.  Having an inactive (sedentary) lifestyle.  Having been diagnosed with insulin resistance.  Having a history of prediabetes, gestational diabetes, or polycystic ovarian syndrome (PCOS).  Being of American-Indian, African-American, Hispanic/Latino, or Asian/Pacific Islander descent. What are the signs or symptoms? Hyperglycemia may not cause any symptoms. If you do have symptoms, they may include early warning signs, such  as:  Increased thirst.  Hunger.  Feeling very tired.  Needing to urinate more often than usual.  Blurry vision. Other symptoms may develop if hyperglycemia gets worse, such as:  Dry mouth.  Loss of appetite.  Fruity-smelling breath.  Weakness.  Unexpected or rapid weight gain or weight loss.  Tingling or numbness in the hands or feet.  Headache.  Skin that does not quickly return to normal after being lightly pinched and released (poor skin turgor).  Abdominal pain.  Cuts or bruises that are slow to heal. How is this diagnosed? Hyperglycemia is diagnosed with a blood test to measure your blood glucose level. This blood test is usually done while you are having symptoms. Your health care provider may also do a physical exam and review your medical history. You may have more tests to determine the cause of your hyperglycemia, such as:  A fasting blood glucose (FBG) test. You will not be allowed to eat (you will fast) for at least 8 hours before a blood sample is taken.  An A1c (hemoglobin A1c) blood test. This provides information about blood glucose control over the previous 2-3 months.  An oral glucose tolerance test (OGTT). This measures your blood glucose at two times:  After fasting. This is your baseline blood glucose level.  Two hours after drinking a beverage that contains glucose. How is this treated? Treatment depends on the cause of your hyperglycemia. Treatment may include:  Taking medicine to regulate your blood glucose levels. If you take insulin or other diabetes medicines, your medicine or dosage may be adjusted.  Lifestyle changes, such as exercising more, eating healthier foods, or losing weight.  Treating an illness or infection, if this caused your hyperglycemia.  Checking your blood glucose more often.  Stopping or reducing steroid medicines, if these caused your hyperglycemia. If your hyperglycemia becomes severe and it results in  hyperglycemic hyperosmolar state, you must be hospitalized and given IV fluids. Follow these instructions at home: General instructions  Take over-the-counter and prescription medicines only as told by your health care provider.  Do not use any products that contain nicotine or tobacco, such as cigarettes and e-cigarettes. If you need help quitting, ask your health care provider.  Limit alcohol intake to no more than 1 drink per day for nonpregnant women and 2 drinks per day for men. One drink equals 12 oz of beer, 5 oz of wine, or 1 oz of hard liquor.  Learn to manage stress. If you need help with this, ask your health care provider.  Keep all follow-up visits as told by your health care provider. This is important. Eating and drinking  Maintain a healthy weight.  Exercise regularly, as directed by your health care provider.  Stay hydrated, especially when you exercise, get sick, or spend time in hot temperatures.  Eat healthy foods, such as:  Lean proteins.  Complex carbohydrates.  Fresh fruits and vegetables.  Low-fat dairy products.  Healthy fats.  Drink enough fluid to keep your urine clear or pale yellow. If you have diabetes:   Make sure you know the  symptoms of hyperglycemia.  Follow your diabetes management plan, as told by your health care provider. Make sure you:  Take your insulin and medicines as directed.  Follow your exercise plan.  Follow your meal plan. Eat on time, and do not skip meals.  Check your blood glucose as often as directed. Make sure to check your blood glucose before and after exercise. If you exercise longer or in a different way than usual, check your blood glucose more often.  Follow your sick day plan whenever you cannot eat or drink normally. Make this plan in advance with your health care provider.  Share your diabetes management plan with people in your workplace, school, and household.  Check your urine for ketones when you  are ill and as told by your health care provider.  Carry a medical alert card or wear medical alert jewelry. Contact a health care provider if:  Your blood glucose is at or above 240 mg/dL (54.0 mmol/L) for 2 days in a row.  You have problems keeping your blood glucose in your target range.  You have frequent episodes of hyperglycemia. Get help right away if:  You have difficulty breathing.  You have a change in how you think, feel, or act (mental status).  You have nausea or vomiting that does not go away. These symptoms may represent a serious problem that is an emergency. Do not wait to see if the symptoms will go away. Get medical help right away. Call your local emergency services (911 in the [place] Do not drive yourself to the hospital.  Summary  Hyperglycemia occurs when the level of sugar (glucose) in the blood is too high.  Hyperglycemia is diagnosed with a blood test to measure your blood glucose level. This blood test is usually done while you are having symptoms. Your health care provider may also do a physical exam and review your medical history.  If you have diabetes, follow your diabetes management plan as told by your health care provider.  Contact your health care provider if you have problems keeping your blood glucose in your target range. This information is not intended to replace advice given to you by your health care provider. Make sure you discuss any questions you have with your health care provider. Document Released: 09/01/2000 Document Revised: 11/24/2015 Document Reviewed: 11/24/2015 Elsevier Interactive Patient Education  2017 Elsevier Inc.  Major Depressive Disorder, Adult Major depressive disorder (MDD) is a mental health condition. MDD often makes you feel sad, hopeless, or helpless. MDD can also cause symptoms in your body. MDD can affect your:  Work.  School.  Relationships.  Other normal activities. MDD can range from mild to very bad.  It may occur once (single episode MDD). It can also occur many times (recurrent MDD). The main symptoms of MDD often include:  Feeling sad, depressed, or irritable most of the time.  Loss of interest. MDD symptoms also include:  Sleeping too much or too little.  Eating too much or too little.  A change in your weight.  Feeling tired (fatigue) or having low energy.  Feeling worthless.  Feeling guilty.  Trouble making decisions.  Trouble thinking clearly.  Thoughts of suicide or harming others.  Feeling weak.  Feeling agitated.  Keeping yourself from being around other people (isolation). Follow these instructions at home: Activity  Do these things as told by your doctor:  Go back to your normal activities.  Exercise regularly.  Spend time outdoors. Alcohol  Talk with your  doctor about how alcohol can affect your antidepressant medicines.  Do not drink alcohol. Or, limit how much alcohol you drink.  This means no more than 1 drink a day for nonpregnant women and 2 drinks a day for men. One drink equals one of these:  12 oz of beer.  5 oz of wine.  1 oz of hard liquor. General instructions  Take over-the-counter and prescription medicines only as told by your doctor.  Eat a healthy diet.  Get plenty of sleep.  Find activities that you enjoy. Make time to do them.  Think about joining a support group. Your doctor may be able to suggest a group for you.  Keep all follow-up visits as told by your doctor. This is important. Where to find more information:  [employer] on Mental Illness:  www.nami.org  [employer] of Mental Health:  http://www.maynard.net/  National Suicide Prevention Lifeline:  956-434-6268. This is free, 24-hour help. Contact a doctor if:  Your symptoms get worse.  You have new symptoms. Get help right away if:  You self-harm.  You see, hear, taste, smell, or feel things that are not present  (hallucinate). If you ever feel like you may hurt yourself or others, or have thoughts about taking your own life, get help right away. You can go to your nearest emergency department or call:  Your local emergency services (911 in the [place]  A suicide crisis helpline, such as the National Suicide Prevention Lifeline:  (253)706-3591. This is open 24 hours a day. This information is not intended to replace advice given to you by your health care provider. Make sure you discuss any questions you have with your health care provider. Document Released: 02/17/2015 Document Revised: 11/23/2015 Document Reviewed: 11/23/2015 Elsevier Interactive Patient Education  2017 [employer].

## 2016-03-25 NOTE — ED Notes (Signed)
Pt punching the walls, screaming that no one has checked on him and that he is experiencing pain all over. Security and [employer] at bedside for assistance.  Comfort measures given.

## 2016-03-25 NOTE — ED Notes (Signed)
Patient arrived on the unit ambulatory.  He immediately started demanding a fan for his room.  When told he could not have one he came out into the hallway and sat on the floor.  Security officer Fayrene FearingJames H was able to talk the patient into going back to his room but he is still angry and refusing to take his medications and is not allowing me to check his blood sugar.

## 2016-03-25 NOTE — Progress Notes (Signed)
Subjective:    Patient ID: Kenneth [name], male    DOB: [birth year], 51 y.o.   MRN: [id number]  HPI Kenneth [name], a 51 year old male with a history of uncontrolled type 2 diabetes mellitus, depression, weight loss and thyroid disease presents accompanied by his sister, Kenneth [name] to establish care. Kenneth [name] was a patient at the [facility], but has been lost to follow-up over the past year. Kenneth [name] was admitted to the hospital on 02/23/2016 with suicidal ideations and was found to have a glucose level greater than 700.  Symptoms: paresthesia of the feet, polydipsia, polyuria and weight loss. Symptoms have gradually worsened. Patient denies visual disturbances and vomitting.  Evaluation to date has been included: hemoglobin A1C which was 13 1 month ago. He says that he has not been able to pick up medications due to cost. He has been using a friends Lantus periodically. He has not been checking blood sugars at home.   Patient is complaining of depression.  He complains of depressed mood, difficulty concentrating, fatigue, feelings of worthlessness/guilt, hopelessness and suicidal thoughts without plan. Onset was approximately several months ago, clinical course has worsened since that time.   He endorses suicidal thoughts and ideations, but does not have a plan.     Risk factors: previous episode of depression Previous treatment includes Prozac 20 mg daily. Patient has not been taking Prozac due to cost constraints.   Patient complains of abdominal pain. Current pain intensity is 9/10 primarily to right upper quadrant. He says that pain is constant and shooting. Reviewed abdominal ultrasound, tiny gallbladder polyps measuring up to 4 mm present, u/s was othertwise unremarkable. Abdominal pain has been worsening in severity over time. Abdominal pain is worsened by eating. Kenneth [name] has not attempted any OTC interventions to alleviate symptoms.    Past Medical History:  Diagnosis  Date  . Arthritis   . Diabetes mellitus   . DKA (diabetic ketoacidosis) ([place]) 07/2014  . Malnutrition ([place])   . Noncompliance with medication regimen   . Thyroid disease    Social History   Social History  . Marital status: Divorced    Spouse name: N/A  . Number of children: N/A  . Years of education: N/A   Occupational History  . Not on file.   Social History Main Topics  . Smoking status: Current Every Day Smoker    Packs/day: 0.50    Years: 8.00  . Smokeless tobacco: Never Used  . Alcohol use No  . Drug use: No  . Sexual activity: Not on file   Other Topics Concern  . Not on file   Social History Narrative  . No narrative on file   Review of Systems  Constitutional: Positive for appetite change, fatigue and unexpected weight change (Weight loss).  HENT: Negative.   Eyes: Negative for photophobia and visual disturbance.  Respiratory: Negative.   Cardiovascular: Negative.   Gastrointestinal: Positive for abdominal pain (right upper quadrant) and nausea.  Endocrine: Negative for polydipsia, polyphagia and polyuria.  Musculoskeletal: Positive for myalgias.  Skin: Negative.   Neurological: Positive for weakness and numbness. Negative for dizziness.  Hematological: Negative.   Psychiatric/Behavioral: Positive for suicidal ideas.       Objective:   Physical Exam  Constitutional: He appears cachectic. He is not intubated.  HENT:  Mouth/Throat: Abnormal dentition. Dental caries present.  Cardiovascular: Regular rhythm.   Pulmonary/Chest: No apnea, no tachypnea and no bradypnea. He is not intubated.  Abdominal:  He exhibits no distension. There is tenderness in the right upper quadrant. There is guarding. There is no rigidity, no tenderness at McBurney's point and negative Murphy's sign.  Lymphadenopathy:       Head (right side): Tonsillar adenopathy present.       Head (left side): Tonsillar adenopathy present.  Neurological: He is alert.  Psychiatric: His mood  appears anxious. He exhibits a depressed mood. He expresses suicidal ideation.      BP 111/67 (BP Location: Left Arm, Patient Position: Sitting, Cuff Size: Normal)   Pulse 72   Temp 98.2 F (36.8 C) (Oral)   Resp 12   Ht '5\' 9"'$  (1.753 m)   Wt 119 lb (54 kg)   SpO2 100%   BMI 17.57 kg/m  Assessment & Plan:  1. Type 2 diabetes mellitus with complication, with long-term current use of insulin (HCC) CBG greater than 400, transitioned to day infusion center for a normal saline bolus.  He also received 10 units of Novolin R.  Discussed diabetes at length. Will start Novolog per sliding scale. Patient given sliding scale sheet to attach to refrigerator, he expressed understanding.  - Glucose (CBG) - COMPLETE METABOLIC PANEL WITH GFR - insulin glargine (LANTUS) 100 UNIT/ML injection; Inject 0.1 mLs (10 Units total) into the skin at bedtime.  Dispense: 10 mL; Refill: 11 - Blood Glucose Monitoring Suppl (TRUE METRIX AIR GLUCOSE METER) w/Device KIT; 1 each by Does not apply route 4 (four) times daily -  before meals and at bedtime.  Dispense: 1 kit; Refill: 0 - glucose blood (TRUE METRIX BLOOD GLUCOSE TEST) test strip; Use as instructed  Dispense: 100 each; Refill: 12 - Insulin Syringes, Disposable, U-100 0.5 ML MISC; 1 each by Does not apply route 4 (four) times daily -  before meals and at bedtime.  Dispense: 100 each; Refill: 5 - insulin aspart (NOVOLOG) 100 unit/mL injection; Per sliding scale  Dispense: 1 vial; Refill: 12  2. Moderate episode of recurrent major depressive disorder [facility]) Patient given directions to [facility] outpatient walk-in clinic.  - FLUoxetine (PROZAC) 20 MG capsule; Take 1 capsule (20 mg total) by mouth daily.  Dispense: 30 capsule; Refill: 2  3. Suicidal ideations Kenneth [name] was given instructions to report to the [facility] Emergency department immediately. Notified emergency room charge RN. He reported to the ED accompanied by sister.   4.  Cachexia (HCC) - HIV antibody (with reflex) - Acute Hep Panel & Hep B Surface Ab  5. Loss of weight - HIV antibody (with reflex) - Acute Hep Panel & Hep B Surface Ab - COMPLETE METABOLIC PANEL WITH GFR - TSH - CBC with Differential  6. Gallbladder polyp Will send a referral to general surgery for further work-up and evaluation  7. Hyperglycemia Blood sugar - insulin regular (NOVOLIN R,HUMULIN R) 250 units/2.49m (100 units/mL) injection 10 Units; Inject 0.1 mLs (10 Units total) into the skin once. - COMPLETE METABOLIC PANEL WITH GFR  8. Gallbladder pain Patient is complaining of 9/10 abdominal pain. - acetaminophen-codeine (TYLENOL #3) 300-30 MG tablet; Take 1 tablet by mouth every 6 (six) hours as needed for moderate pain. (Patient not taking: Reported on 03/25/2016)  Dispense: 30 tablet; Refill: 0  9. Neuropathy (HCC) - gabapentin (NEURONTIN) 300 MG capsule; Take 1 capsule (300 mg total) by mouth 3 (three) times daily.  Dispense: 90 capsule; Refill: 2   RTC: 1 month follow up.   Patient to transition to day hospital for a fluid bolus and CBG  to follow.  Patient to report to the emergency department for suicidal ideations accompanied by sister Kenneth [name].    Dorena Dew, FNP

## 2016-03-25 NOTE — ED Triage Notes (Signed)
Patient was brought in by family members with thoughts of harming himself.  Patient states he can't be alone due to thoughts of SI.

## 2016-03-25 NOTE — Progress Notes (Signed)
Diagnosis Association: Hyperglycemia (R73.9)  Provider: Julianne HandlerLaChina Hollis, FNP  Procedure: Pt received 500cc normal saline via piv. CBG was checked.Then patient received another 500cc normal saline and cbg was repeated.   Pt tolerated procedure well.  Post procedure: Pt alert, oriented and ambulatory with family member. Discharge instructions given to him with verbal understanding.

## 2016-03-25 NOTE — ED Notes (Signed)
Kenneth [name] (sister) [phone].  Please call with any discharge changes.

## 2016-03-25 NOTE — BH Assessment (Addendum)
Assessment Note  Kenneth [name] is an 51 y.o. male presenting to [facility] for suicidal ideations. Patient lives alone and doesn't feel safe at home. Patient has felt increasingly depressed for months. He describes his depression as hopelessness, isolating self from others, crying spells, loss of interest in usual pleasures, and worthlessness. Patient also with reported vegetative symptoms of staying in the bed, decreased grooming, and also not bathing. Trigger for suicidal ideations are related to poor support systems. Sts that he doesn't have anyone to assist him with getting food, paying bills, cleaning his home, etc. He reports not eating in 3-4 days because he doesn't have access to money to obtain food. Patient has filed for disability and he waiting to see if he has been approved to receive benefits. Patient is overall suicidal with no plan. He is however unable to contract for safety. He denies history of prior suicide attempts and/or gestures. No self mutilating behaviors. No HI. He reports auditory hallucinations of voices, "Screaming my name". No visual hallucinations reported. He denies alcohol and drug use. No history of alcohol and drug use. Patient does not have a psychiatrist or therapist. Sts that he is limited with ambulatating and uses a cane at home to assist with walking. No history of abuse. No family history of mental health illness.   Diagnosis: Major Depressive Disorder, Recurrent, Severe, with psychotic features  Past Medical History:  Past Medical History:  Diagnosis Date  . Arthritis   . Diabetes mellitus   . DKA (diabetic ketoacidosis) (HCC) 07/2014  . Malnutrition (HCC)   . Noncompliance with medication regimen   . Thyroid disease     History reviewed. No pertinent surgical history.  Family History:  Family History  Problem Relation Age of Onset  . Diabetes Mother   . Seizures Sister   . Diabetes Sister     Social History:  reports that he has been smoking.  He has  a 4.00 pack-year smoking history. He has never used smokeless tobacco. He reports that he does not drink alcohol or use drugs.  Additional Social History:  Alcohol / Drug Use Pain Medications: SEE MAR Prescriptions: SEE MAR Over the Counter: SEE MAR History of alcohol / drug use?: No history of alcohol / drug abuse  CIWA: CIWA-Ar BP: 119/77 Pulse Rate: 62 COWS:    Allergies: No Known Allergies  Home Medications:  (Not in a hospital admission)  OB/GYN Status:  No LMP for male patient.  General Assessment Data Location of Assessment: [facility] TTS Assessment: In system Is this a Tele or Face-to-Face Assessment?: Face-to-Face Is this an Initial Assessment or a Re-assessment for this encounter?: Initial Assessment Marital status: Divorced Goodman name:  (n/a) Is patient pregnant?: No Pregnancy Status: No Living Arrangements: Alone Can pt return to current living arrangement?: Yes Admission Status: Voluntary Is patient capable of signing voluntary admission?: Yes Referral Source: Self/Family/Friend Insurance type:  (self pay )     Crisis Care Plan Living Arrangements: Alone Legal Guardian: Other: (no guardian ) Name of Psychiatrist:  (no psychiatrist ) Name of Therapist:  (no therapist )  Education Status Is patient currently in school?: No Current Grade:  (n/a) Highest grade of school patient has completed:  (12th grade ) Name of school:  (n/a) Contact person:  (n/a)  Risk to self with the past 6 months Suicidal Ideation: Yes-Currently Present Has patient been a risk to self within the past 6 months prior to admission? : Yes Suicidal Intent: Yes-Currently Present Has patient  had any suicidal intent within the past 6 months prior to admission? : Yes Is patient at risk for suicide?: Yes Suicidal Plan?: No Has patient had any suicidal plan within the past 6 months prior to admission? : No Access to Means: No What has been your use of drugs/alcohol within the last 12  months?:  (denies ) Previous Attempts/Gestures: No How many times?:  (0) Other Self Harm Risks:  (denies ) Triggers for Past Attempts: Other (Comment) (no past atttempts or gestures ) Intentional Self Injurious Behavior: None Recent stressful life event(s): Other (Comment) ("Can't get help doing anything..no way to get $..food...bill) Persecutory voices/beliefs?: No Depression: Yes Depression Symptoms: Feeling angry/irritable, Loss of interest in usual pleasures, Feeling worthless/self pity, Guilt, Fatigue, Isolating, Despondent, Insomnia, Tearfulness Substance abuse history and/or treatment for substance abuse?: No Suicide prevention information given to non-admitted patients: Not applicable  Risk to Others within the past 6 months Homicidal Ideation: No Does patient have any lifetime risk of violence toward others beyond the six months prior to admission? : No Thoughts of Harm to Others: No Current Homicidal Intent: No Current Homicidal Plan: No Access to Homicidal Means: No Identified Victim:  (n/a) History of harm to others?: No Assessment of Violence: None Noted Violent Behavior Description:  (patient is calm and cooperative ) Does patient have access to weapons?: No Criminal Charges Pending?: No Does patient have a court date: No Is patient on probation?: No  Psychosis Hallucinations: Auditory ("People hollering my name or at me") Delusions: None noted  Mental Status Report Appearance/Hygiene: Disheveled, In scrubs Eye Contact: Poor Motor Activity: Freedom of movement Speech: Logical/coherent Level of Consciousness: Alert Mood: Depressed Affect: Appropriate to circumstance Anxiety Level: None Thought Processes: Relevant, Coherent Judgement: Impaired Orientation: Person, Place, Time, Situation Obsessive Compulsive Thoughts/Behaviors: None  Cognitive Functioning Concentration: Decreased Memory: Recent Intact, Remote Intact IQ: Average Insight: Fair Impulse  Control: Poor Appetite: Poor Weight Loss:  (no means to get food; 40 pounds in 1 yr) Weight Gain:  (denies ) Sleep: Decreased Total Hours of Sleep:  (varies- 2 hrs at a time) Vegetative Symptoms: None  ADLScreening [facility] Assessment Services) Patient's cognitive ability adequate to safely complete daily activities?: Yes Patient able to express need for assistance with ADLs?: Yes Independently performs ADLs?: Yes (appropriate for developmental age)  Prior Inpatient Therapy Prior Inpatient Therapy: No Prior Therapy Dates:  (n/a) Prior Therapy Facilty/Provider(s):  (n/a) Reason for Treatment:  (n/a)  Prior Outpatient Therapy Prior Outpatient Therapy: No Prior Therapy Dates:  (n/a) Prior Therapy Facilty/Provider(s):  (n/a) Reason for Treatment:  (n/a) Does patient have an ACCT team?: No Does patient have Intensive In-House Services?  : No Does patient have [facility] services? : No Does patient have P4CC services?: No  ADL Screening (condition at time of admission) Patient's cognitive ability adequate to safely complete daily activities?: Yes Is the patient deaf or have difficulty hearing?: No Does the patient have difficulty seeing, even when wearing glasses/contacts?: No Patient able to express need for assistance with ADLs?: Yes Does the patient have difficulty dressing or bathing?: No Independently performs ADLs?: Yes (appropriate for developmental age) Does the patient have difficulty walking or climbing stairs?: Yes (patient uses a cane to walk..sts he can't walk very far) Weakness of Legs: Both Weakness of Arms/Hands: Both  Home Assistive Devices/Equipment Home Assistive Devices/Equipment: Cane (specify quad or straight)    Abuse/Neglect Assessment (Assessment to be complete while patient is alone) Physical Abuse: Denies Verbal Abuse: Denies Sexual Abuse: Denies Exploitation of patient/patient's  resources: Denies Self-Neglect: Denies Values / Beliefs Cultural Requests  During Hospitalization: None Spiritual Requests During Hospitalization: None   Advance Directives (For Healthcare) Does Patient Have a Medical Advance Directive?: No Would patient like information on creating a medical advance directive?: No - Patient declined Nutrition Screen- [facility] Patient's home diet: Regular  Additional Information 1:1 In Past 12 Months?: No CIRT Risk: No Elopement Risk: No Does patient have medical clearance?: Yes     Disposition:  Disposition Initial Assessment Completed for this Encounter: Yes  On Site Evaluation by:   Reviewed with Physician:    Melynda Rippleoyka Millenia Waldvogel 03/25/2016 3:52 PM

## 2016-03-25 NOTE — ED Notes (Signed)
Patient complains of pain and refuses to take medication that has been orders and refuses tylenol or motrin for pain.Patient also refuses CBG and vital signs. Patient also arguing with staff because the light can not be adjusted. Patient walks off in agitated manner. Encouragement and support provided and safety maintain. Q 15 min safety checks remain in place and video monitoring.

## 2016-03-26 ENCOUNTER — Encounter (HOSPITAL_COMMUNITY): Payer: Self-pay | Admitting: *Deleted

## 2016-03-26 ENCOUNTER — Inpatient Hospital Stay (HOSPITAL_COMMUNITY)
Admission: AD | Admit: 2016-03-26 | Discharge: 2016-04-06 | DRG: 885 | Disposition: A | Discharge status: Home / Self Care | Payer: No Typology Code available for payment source | Source: Intra-hospital | Attending: Emergency Medicine | Admitting: Emergency Medicine

## 2016-03-26 DIAGNOSIS — F411 Generalized anxiety disorder: Secondary | ICD-10-CM | POA: Diagnosis present

## 2016-03-26 DIAGNOSIS — R45851 Suicidal ideations: Secondary | ICD-10-CM

## 2016-03-26 DIAGNOSIS — Z833 Family history of diabetes mellitus: Secondary | ICD-10-CM | POA: Diagnosis not present

## 2016-03-26 DIAGNOSIS — Z9114 Patient's other noncompliance with medication regimen: Secondary | ICD-10-CM

## 2016-03-26 DIAGNOSIS — R945 Abnormal results of liver function studies: Secondary | ICD-10-CM

## 2016-03-26 DIAGNOSIS — E0590 Thyrotoxicosis, unspecified without thyrotoxic crisis or storm: Secondary | ICD-10-CM | POA: Diagnosis present

## 2016-03-26 DIAGNOSIS — E1142 Type 2 diabetes mellitus with diabetic polyneuropathy: Secondary | ICD-10-CM | POA: Diagnosis present

## 2016-03-26 DIAGNOSIS — Z79899 Other long term (current) drug therapy: Secondary | ICD-10-CM

## 2016-03-26 DIAGNOSIS — R7989 Other specified abnormal findings of blood chemistry: Secondary | ICD-10-CM

## 2016-03-26 DIAGNOSIS — F0630 Mood disorder due to known physiological condition, unspecified: Secondary | ICD-10-CM | POA: Diagnosis not present

## 2016-03-26 DIAGNOSIS — E118 Type 2 diabetes mellitus with unspecified complications: Secondary | ICD-10-CM | POA: Diagnosis not present

## 2016-03-26 DIAGNOSIS — Z9181 History of falling: Secondary | ICD-10-CM

## 2016-03-26 DIAGNOSIS — K269 Duodenal ulcer, unspecified as acute or chronic, without hemorrhage or perforation: Secondary | ICD-10-CM | POA: Diagnosis present

## 2016-03-26 DIAGNOSIS — F332 Major depressive disorder, recurrent severe without psychotic features: Principal | ICD-10-CM | POA: Diagnosis present

## 2016-03-26 DIAGNOSIS — E1165 Type 2 diabetes mellitus with hyperglycemia: Secondary | ICD-10-CM | POA: Diagnosis present

## 2016-03-26 DIAGNOSIS — Z794 Long term (current) use of insulin: Secondary | ICD-10-CM

## 2016-03-26 DIAGNOSIS — G4700 Insomnia, unspecified: Secondary | ICD-10-CM | POA: Diagnosis present

## 2016-03-26 DIAGNOSIS — E871 Hypo-osmolality and hyponatremia: Secondary | ICD-10-CM | POA: Diagnosis present

## 2016-03-26 DIAGNOSIS — Z818 Family history of other mental and behavioral disorders: Secondary | ICD-10-CM

## 2016-03-26 DIAGNOSIS — K589 Irritable bowel syndrome without diarrhea: Secondary | ICD-10-CM | POA: Diagnosis present

## 2016-03-26 DIAGNOSIS — F322 Major depressive disorder, single episode, severe without psychotic features: Secondary | ICD-10-CM | POA: Diagnosis not present

## 2016-03-26 DIAGNOSIS — R739 Hyperglycemia, unspecified: Secondary | ICD-10-CM

## 2016-03-26 DIAGNOSIS — F17210 Nicotine dependence, cigarettes, uncomplicated: Secondary | ICD-10-CM | POA: Diagnosis not present

## 2016-03-26 DIAGNOSIS — F1410 Cocaine abuse, uncomplicated: Secondary | ICD-10-CM | POA: Diagnosis present

## 2016-03-26 DIAGNOSIS — IMO0002 Reserved for concepts with insufficient information to code with codable children: Secondary | ICD-10-CM | POA: Insufficient documentation

## 2016-03-26 DIAGNOSIS — Z8489 Family history of other specified conditions: Secondary | ICD-10-CM | POA: Diagnosis not present

## 2016-03-26 DIAGNOSIS — E1065 Type 1 diabetes mellitus with hyperglycemia: Secondary | ICD-10-CM | POA: Diagnosis not present

## 2016-03-26 DIAGNOSIS — R109 Unspecified abdominal pain: Secondary | ICD-10-CM | POA: Diagnosis not present

## 2016-03-26 DIAGNOSIS — R1013 Epigastric pain: Secondary | ICD-10-CM

## 2016-03-26 DIAGNOSIS — E119 Type 2 diabetes mellitus without complications: Secondary | ICD-10-CM

## 2016-03-26 LAB — CBC WITH DIFFERENTIAL/PLATELET
BASOS ABS: 83 {cells}/uL (ref 0–200)
Basophils Relative: 1 %
Eosinophils Absolute: 83 cells/uL (ref 15–500)
Eosinophils Relative: 1 %
HCT: 45.8 % (ref 38.5–50.0)
HEMOGLOBIN: 15.3 g/dL (ref 13.2–17.1)
LYMPHS ABS: 1660 {cells}/uL (ref 850–3900)
Lymphocytes Relative: 20 %
MCH: 32.6 pg (ref 27.0–33.0)
MCHC: 33.4 g/dL (ref 32.0–36.0)
MCV: 97.4 fL (ref 80.0–100.0)
MONO ABS: 581 {cells}/uL (ref 200–950)
MPV: 10.9 fL (ref 7.5–12.5)
Monocytes Relative: 7 %
NEUTROS PCT: 71 %
Neutro Abs: 5893 cells/uL (ref 1500–7800)
Platelets: 239 10*3/uL (ref 140–400)
RBC: 4.7 MIL/uL (ref 4.20–5.80)
RDW: 13.2 % (ref 11.0–15.0)
WBC: 8.3 10*3/uL (ref 3.8–10.8)

## 2016-03-26 LAB — ACUTE HEP PANEL AND HEP B SURFACE AB
HCV Ab: NEGATIVE
HEP A IGM: NONREACTIVE
Hep B C IgM: NONREACTIVE
Hep B S Ab: NEGATIVE
Hepatitis B Surface Ag: NEGATIVE

## 2016-03-26 LAB — CBG MONITORING, ED: GLUCOSE-CAPILLARY: 344 mg/dL — AB (ref 65–99)

## 2016-03-26 LAB — GLUCOSE, CAPILLARY: Glucose-Capillary: 374 mg/dL — ABNORMAL HIGH (ref 65–99)

## 2016-03-26 LAB — HIV ANTIBODY (ROUTINE TESTING W REFLEX): HIV: NONREACTIVE

## 2016-03-26 MED ORDER — ALUM & MAG HYDROXIDE-SIMETH 200-200-20 MG/5ML PO SUSP
30.0000 mL | ORAL | Status: DC | PRN
  Administered 2016-03-31 – 2016-04-04 (×3): 30 mL via ORAL
  Filled 2016-03-26 (×3): qty 30

## 2016-03-26 MED ORDER — INSULIN ASPART 100 UNIT/ML ~~LOC~~ SOLN
0.0000 [IU] | Freq: Three times a day (TID) | SUBCUTANEOUS | Status: DC
Start: 2016-03-27 — End: 2016-03-27
  Administered 2016-03-27 (×2): 9 [IU] via SUBCUTANEOUS

## 2016-03-26 MED ORDER — FLUOXETINE HCL 20 MG PO CAPS
20.0000 mg | ORAL_CAPSULE | Freq: Every day | ORAL | Status: DC
  Administered 2016-03-27 – 2016-04-06 (×11): 20 mg via ORAL
  Filled 2016-03-26 (×10): qty 1
  Filled 2016-03-26: qty 7
  Filled 2016-03-26 (×5): qty 1

## 2016-03-26 MED ORDER — TRAZODONE HCL 50 MG PO TABS
50.0000 mg | ORAL_TABLET | Freq: Every evening | ORAL | Status: DC | PRN
  Administered 2016-03-26: 50 mg via ORAL
  Filled 2016-03-26 (×7): qty 1

## 2016-03-26 MED ORDER — GABAPENTIN 300 MG PO CAPS
300.0000 mg | ORAL_CAPSULE | Freq: Three times a day (TID) | ORAL | Status: DC
  Administered 2016-03-27 (×2): 300 mg via ORAL
  Filled 2016-03-26 (×9): qty 1

## 2016-03-26 MED ORDER — ACETAMINOPHEN 325 MG PO TABS
650.0000 mg | ORAL_TABLET | Freq: Four times a day (QID) | ORAL | Status: DC | PRN
  Administered 2016-03-27 – 2016-04-05 (×7): 650 mg via ORAL
  Filled 2016-03-26 (×9): qty 2

## 2016-03-26 MED ORDER — MAGNESIUM HYDROXIDE 400 MG/5ML PO SUSP: 30.0000 mL | Freq: Every day | ORAL | Status: DC | PRN

## 2016-03-26 MED ORDER — HYDROXYZINE HCL 25 MG PO TABS
25.0000 mg | ORAL_TABLET | Freq: Three times a day (TID) | ORAL | Status: DC | PRN
  Administered 2016-03-27 – 2016-04-01 (×4): 25 mg via ORAL
  Filled 2016-03-26 (×5): qty 1
  Filled 2016-03-26: qty 10

## 2016-03-26 MED ORDER — INSULIN GLARGINE 100 UNIT/ML ~~LOC~~ SOLN
10.0000 [IU] | Freq: Every day | SUBCUTANEOUS | Status: DC
  Administered 2016-03-26 – 2016-03-28 (×3): 10 [IU] via SUBCUTANEOUS

## 2016-03-26 NOTE — Progress Notes (Signed)
03/26/16 1340:  LRT introduced self to pt and offered activities.  Pt was lying down and appeared tired.  Pt stated he wasn't interested at the moment.  Caroll RancherMarjette Royanne Warshaw, LRT/CTRS

## 2016-03-26 NOTE — Progress Notes (Signed)
Admission Note:  51 year old male who presents voluntary, in no acute distress, for the treatment of SI, Depression, and Substance Abuse. Patient appears anxious and grimacing. Patient was cooperative with admission process. Prior to admission, patient was SI with a plan to "stop treating my diabetes".  Patient currently presents with passive SI and contracts for safety upon admission. Patient reports AVH.  Patient states "I hear people hollering my name and sounds outside of my house and people knocking on my door but when I look out, nobody is out there. I also see shadows".  Patient reports reports that he is "really depressed, snapping on people, nerves shot, and feeling like giving up sometimes".  Patient currently lives alone and identifies his home nurses, church, and sisters as his support system.  Patient reports that he has been feeling depressed and hearing voices for the past "couple of months".  Patient identifies stressors as January 2nd being the year anniversary of his mother's death, health issues, "can't pay, can't eat, and can't properly manage my diabetes".  Patient reports hx of seizures and DKA. Patient has complaints of "chest pain, missing teeth, and blurred vision".  Patient is brittle diabetic. On admission patient's CBG was 374.  [facility] would like to work on "Getting my head straight" and "Being able to function". Skin was assessed. Patient has bruising to legs bilateral, scratches to left hand, and blister to right foot.  Patient searched and no contraband found, POC and unit policies explained and understanding verbalized. Consents obtained. Patient had no additional questions or concerns.

## 2016-03-26 NOTE — Consult Note (Signed)
[facility] Consult   Reason for Consult:  Suicidal and depression Referring Physician:  EDP Patient Identification: Kenneth [name] MRN:  [id number] Principal Diagnosis: Recurrent major depressive disorder [facility]) Diagnosis:   Patient Active Problem List   Diagnosis Date Noted  . Recurrent major depressive disorder ([place]) [F33.9]     Priority: High  . Abdominal pain [R10.9]   . Hyperglycemia [R73.9] 02/24/2016  . Volume depletion [E86.9] 12/21/2015  . Metabolic acidosis [Y05.1] 12/21/2015  . Diabetes mellitus with nonketotic hyperosmolarity ([place]) [E11.00] 09/21/2014  . Noncompliance with medication regimen [Z91.14]   . Diabetes mellitus ([place]) [E11.9]   . DKA (diabetic ketoacidoses) ([place]) [E13.10] 07/24/2014  . Dehydration [E86.0] 07/24/2014  . Acute renal failure ([place]) [N17.9] 07/24/2014  . Hyperkalemia [E87.5] 07/24/2014  . Hyponatremia [E87.1] 07/24/2014  . Hyperthyroidism [E05.90] 07/24/2014    Total Time spent with patient: 45 minutes  Subjective:   Kenneth [name] is a 51 y.o. male patient admitted with suicidal thoughts.  HPI:   Kenneth [name] is an 51 y.o. male with history of Major depression and multiple medical problem. Patient reports that he came to [facility] due to worsening depression, hopelessness and recurrent thoughts of harming her self. Patient states that he has been self  isolating self, crying spells, loss of interest in usual pleasures, and worthlessness. Patient reports that his stressor includes being lonely,  getting food, paying bills and cleaning his home. Patient denies alcohol use disorder but smokes Cocaine due to being depressed.   Past Psychiatric History: as above  Risk to Self: Suicidal Ideation: Yes-Currently Present Suicidal Intent: Yes-Currently Present Is patient at risk for suicide?: Yes Suicidal Plan?: No Access to Means: No What has been your use of drugs/alcohol within the last 12 months?:  (denies ) How many times?:   (0) Other Self Harm Risks:  (denies ) Triggers for Past Attempts: Other (Comment) (no past atttempts or gestures ) Intentional Self Injurious Behavior: None Risk to Others: Homicidal Ideation: No Thoughts of Harm to Others: No Current Homicidal Intent: No Current Homicidal Plan: No Access to Homicidal Means: No Identified Victim:  (n/a) History of harm to others?: No Assessment of Violence: None Noted Violent Behavior Description:  (patient is calm and cooperative ) Does patient have access to weapons?: No Criminal Charges Pending?: No Does patient have a court date: No Prior Inpatient Therapy: Prior Inpatient Therapy: No Prior Therapy Dates:  (n/a) Prior Therapy Facilty/Provider(s):  (n/a) Reason for Treatment:  (n/a) Prior Outpatient Therapy: Prior Outpatient Therapy: No Prior Therapy Dates:  (n/a) Prior Therapy Facilty/Provider(s):  (n/a) Reason for Treatment:  (n/a) Does patient have an ACCT team?: No Does patient have Intensive In-House Services?  : No Does patient have [facility] services? : No Does patient have P4CC services?: No  Past Medical History:  Past Medical History:  Diagnosis Date  . Arthritis   . Diabetes mellitus   . DKA (diabetic ketoacidosis) ([place]) 07/2014  . Malnutrition ([place])   . Noncompliance with medication regimen   . Thyroid disease    History reviewed. No pertinent surgical history. Family History:  Family History  Problem Relation Age of Onset  . Diabetes Mother   . Seizures Sister   . Diabetes Sister    Family Psychiatric  History: Social History:  History  Alcohol Use No     History  Drug Use No    Social History   Social History  . Marital status: Divorced    Spouse name: N/A  .  Number of children: N/A  . Years of education: N/A   Social History Main Topics  . Smoking status: Current Every Day Smoker    Packs/day: 0.50    Years: 8.00  . Smokeless tobacco: Never Used  . Alcohol use No  . Drug use: No  . Sexual activity:  Not Asked   Other Topics Concern  . None   Social History Narrative  . None   Additional Social History:    Allergies:  No Known Allergies  Labs:  Results for orders placed or performed during the hospital encounter of 03/25/16 (from the past 48 hour(s))  Comprehensive metabolic panel     Status: Abnormal   Collection Time: 03/25/16  1:07 PM  Result Value Ref Range   Sodium 137 135 - 145 mmol/L   Potassium 3.2 (L) 3.5 - 5.1 mmol/L   Chloride 104 101 - 111 mmol/L   CO2 25 22 - 32 mmol/L   Glucose, Bld 80 65 - 99 mg/dL   BUN 10 6 - 20 mg/dL   Creatinine, Ser 0.64 0.61 - 1.24 mg/dL   Calcium 8.6 (L) 8.9 - 10.3 mg/dL   Total Protein 6.2 (L) 6.5 - 8.1 g/dL   Albumin 3.9 3.5 - 5.0 g/dL   AST 16 15 - 41 U/L   ALT 17 17 - 63 U/L   Alkaline Phosphatase 59 38 - 126 U/L   Total Bilirubin 0.3 0.3 - 1.2 mg/dL   GFR calc non Af Amer >60 >60 mL/min   GFR calc Af Amer >60 >60 mL/min    Comment: (NOTE) The eGFR has been calculated using the CKD EPI equation. This calculation has not been validated in all clinical situations. eGFR's persistently <60 mL/min signify possible Chronic Kidney Disease.    Anion gap 8 5 - 15  Ethanol     Status: None   Collection Time: 03/25/16  1:07 PM  Result Value Ref Range   Alcohol, Ethyl (B) <5 <5 mg/dL    Comment:        LOWEST DETECTABLE LIMIT FOR SERUM ALCOHOL IS 5 mg/dL FOR MEDICAL PURPOSES ONLY   Salicylate level     Status: None   Collection Time: 03/25/16  1:07 PM  Result Value Ref Range   Salicylate Lvl <4.3 2.8 - 30.0 mg/dL  Acetaminophen level     Status: Abnormal   Collection Time: 03/25/16  1:07 PM  Result Value Ref Range   Acetaminophen (Tylenol), Serum <10 (L) 10 - 30 ug/mL    Comment:        THERAPEUTIC CONCENTRATIONS VARY SIGNIFICANTLY. A RANGE OF 10-30 ug/mL MAY BE AN EFFECTIVE CONCENTRATION FOR MANY PATIENTS. HOWEVER, SOME ARE BEST TREATED AT CONCENTRATIONS OUTSIDE THIS RANGE. ACETAMINOPHEN CONCENTRATIONS >150 ug/mL  AT 4 HOURS AFTER INGESTION AND >50 ug/mL AT 12 HOURS AFTER INGESTION ARE OFTEN ASSOCIATED WITH TOXIC REACTIONS.   cbc     Status: Abnormal   Collection Time: 03/25/16  1:07 PM  Result Value Ref Range   WBC 8.3 4.0 - 10.5 K/uL   RBC 4.30 4.22 - 5.81 MIL/uL   Hemoglobin 12.6 (L) 13.0 - 17.0 g/dL   HCT 38.0 (L) 39.0 - 52.0 %   MCV 88.4 78.0 - 100.0 fL   MCH 29.3 26.0 - 34.0 pg   MCHC 33.2 30.0 - 36.0 g/dL   RDW 13.0 11.5 - 15.5 %   Platelets 175 150 - 400 K/uL  POC CBG, ED     Status: None   Collection  Time: 03/25/16  1:11 PM  Result Value Ref Range   Glucose-Capillary 75 65 - 99 mg/dL  Rapid urine drug screen (hospital performed)     Status: Abnormal   Collection Time: 03/25/16  3:57 PM  Result Value Ref Range   Opiates NONE DETECTED NONE DETECTED   Cocaine POSITIVE (A) NONE DETECTED   Benzodiazepines NONE DETECTED NONE DETECTED   Amphetamines NONE DETECTED NONE DETECTED   Tetrahydrocannabinol NONE DETECTED NONE DETECTED   Barbiturates NONE DETECTED NONE DETECTED    Comment:        DRUG SCREEN FOR MEDICAL PURPOSES ONLY.  IF CONFIRMATION IS NEEDED FOR ANY PURPOSE, NOTIFY LAB WITHIN 5 DAYS.        LOWEST DETECTABLE LIMITS FOR URINE DRUG SCREEN Drug Class       Cutoff (ng/mL) Amphetamine      1000 Barbiturate      200 Benzodiazepine   200 Tricyclics       300 Opiates          300 Cocaine          300 THC              50   CBG monitoring, ED     Status: Abnormal   Collection Time: 03/26/16  8:03 AM  Result Value Ref Range   Glucose-Capillary 344 (H) 65 - 99 mg/dL    Current Facility-Administered Medications  Medication Dose Route Frequency Provider Last Rate Last Dose  . FLUoxetine (PROZAC) capsule 20 mg  20 mg Oral Daily Tilden Fossa, MD   20 mg at 03/26/16 0955  . gabapentin (NEURONTIN) capsule 300 mg  300 mg Oral TID Tilden Fossa, MD   300 mg at 03/26/16 0955  . insulin aspart (novoLOG) injection 0-9 Units  0-9 Units Subcutaneous TID WC Tilden Fossa, MD    7 Units at 03/26/16 [phone]  . insulin glargine (LANTUS) injection 10 Units  10 Units Subcutaneous QHS Tilden Fossa, MD      . potassium chloride SA (K-DUR,KLOR-CON) CR tablet 20 mEq  20 mEq Oral Once Tilden Fossa, MD       Current Outpatient Prescriptions  Medication Sig Dispense Refill  . Blood Glucose Monitoring Suppl (TRUE METRIX AIR GLUCOSE METER) w/Device KIT 1 each by Does not apply route 4 (four) times daily -  before meals and at bedtime. 1 kit 0  . FLUoxetine (PROZAC) 20 MG capsule Take 1 capsule (20 mg total) by mouth daily. 30 capsule 2  . gabapentin (NEURONTIN) 300 MG capsule Take 1 capsule (300 mg total) by mouth 3 (three) times daily. 90 capsule 2  . glucose blood (TRUE METRIX BLOOD GLUCOSE TEST) test strip Use as instructed 100 each 12  . insulin aspart (NOVOLOG) 100 unit/mL injection Per sliding scale 1 vial 12  . insulin glargine (LANTUS) 100 UNIT/ML injection Inject 0.1 mLs (10 Units total) into the skin at bedtime. 10 mL 11  . acetaminophen-codeine (TYLENOL #3) 300-30 MG tablet Take 1 tablet by mouth every 6 (six) hours as needed for moderate pain. (Patient not taking: Reported on 03/25/2016) 30 tablet 0  . Insulin Syringes, Disposable, U-100 0.5 ML MISC 1 each by Does not apply route 4 (four) times daily -  before meals and at bedtime. (Patient not taking: Reported on 03/25/2016) 100 each 5    Musculoskeletal: Strength & Muscle Tone: within normal limits Gait & Station: normal Patient leans: N/A  Psychiatric Specialty Exam: Physical Exam  Psychiatric: His speech is normal. He is  agitated, slowed and withdrawn. Cognition and memory are normal. He expresses impulsivity. He exhibits a depressed mood. He expresses suicidal ideation. He expresses suicidal plans.    Review of Systems  Constitutional: Negative.   HENT: Negative.   Eyes: Negative.   Respiratory: Negative.   Cardiovascular: Negative.   Gastrointestinal: Negative.   Genitourinary: Negative.   Musculoskeletal:  Negative.   Skin: Negative.   Neurological: Negative.   Endo/Heme/Allergies: Negative.   Psychiatric/Behavioral: Positive for depression, substance abuse and suicidal ideas. The patient is nervous/anxious.     Blood pressure 119/77, pulse 62, temperature 98.1 F (36.7 C), temperature source Oral, resp. rate 18, height _0  (1.753 m), weight 57.2 kg (126 lb), SpO2 99 %.Body mass index is 18.61 kg/m.  General Appearance: Disheveled  Eye Contact:  Minimal  Speech:  Clear and Coherent  Volume:  Decreased  Mood:  Anxious and Depressed  Affect:  Constricted  Thought Process:  Coherent  Orientation:  Full (Time, Place, and Person)  Thought Content:  Logical  Suicidal Thoughts:  Yes.  with intent/plan  Homicidal Thoughts:  No  Memory:  Immediate;   Fair Recent;   Fair Remote;   Fair  Judgement:  Poor  Insight:  Lacking  Psychomotor Activity:  Psychomotor Retardation  Concentration:  Concentration: Fair and Attention Span: Fair  Recall:  Good  Fund of Knowledge:  Good  Language:  Good  Akathisia:  No  Handed:  Right  AIMS (if indicated):     Assets:  Communication Skills  ADL's:  Intact  Cognition:  WNL  Sleep:   poor     Treatment Plan Summary: Daily contact with patient to assess and evaluate symptoms and progress in treatment and Medication management  Continue Prozac 20 mg daily for depression Continue Gabapentin 300 mg tid for mood  Disposition: Recommend psychiatric Inpatient admission when medically cleared.  Corena Pilgrim, MD 03/26/2016 11:11 AM

## 2016-03-26 NOTE — BH Assessment (Addendum)
Dr. Jannifer FranklinAkintayo and Malachy Chamberakia Starkes, NP, recommend INPT treatment. Room assignment is 302-2.

## 2016-03-26 NOTE — ED Notes (Signed)
Introduced self to patient. Pt oriented to unit expectations.  Assessed pt for:  A) Anxiety &/or agitation: Pt is irritable because he does not get extra food. He was cooperative with Dr. Gloris ManchesterAkintayo's assessment and reported being depressed with things not going well for the past 2 or 3 months. He is trying to get disability.   S) Safety: Safety maintained with q-15-minute checks and hourly rounds by staff.  A) ADLs: Pt able to perform ADLs independently.  P) Pick-Up (room cleanliness): Pt's room clean and free of clutter.

## 2016-03-26 NOTE — ED Notes (Signed)
Checked pt's CBG and the arm band did not match this account, but the glucometer allowed the CBG to be taken, so this writer did not realize that the CBG result would not populate on this chart. CBG was = 344 this am. Pt is disgruntled with the care here because he cannot have a second tray. He was given a Sprite Zero with his tray, but extra food was not given. He ate 100% of food.  Pt given 7 units insulin per order.

## 2016-03-26 NOTE — ED Notes (Signed)
Pt transported to [facility] by [facility]. All belongings returned to pt who signed for same. Pt calm and cooperative.  

## 2016-03-26 NOTE — Tx Team (Signed)
Initial Treatment Plan 03/26/2016 8:21 PM Kenneth RainwaterRandy C [name] EAV:[id number]    PATIENT STRESSORS: Financial difficulties Health problems Substance abuse   PATIENT STRENGTHS: Ability for insight Communication skills Motivation for treatment/growth Supportive family/friends   PATIENT IDENTIFIED PROBLEMS: At risk for suicide  Substance Abuse  "Getting my head straight"  "Being able to function"               DISCHARGE CRITERIA:  Ability to meet basic life and health needs Improved stabilization in mood, thinking, and/or behavior Medical problems require only outpatient monitoring Motivation to continue treatment in a less acute level of care Need for constant or close observation no longer present  PRELIMINARY DISCHARGE PLAN: Attend 12-step recovery group Outpatient therapy Return to previous living arrangement  PATIENT/FAMILY INVOLVEMENT: This treatment plan has been presented to and reviewed with the patient, Kenneth RainwaterRandy C [name].  The patient and family have been given the opportunity to ask questions and make suggestions.  Kenneth OverlieMiddleton, Ambrea Hegler P, RN 03/26/2016, 8:21 PM

## 2016-03-26 NOTE — Progress Notes (Signed)
Inpatient Diabetes Program Recommendations  AACE/ADA: New Consensus Statement on Inpatient Glycemic Control (2015)  Target Ranges:  Prepandial:   less than 140 mg/dL      Peak postprandial:   less than 180 mg/dL (1-2 hours)      Critically ill patients:  140 - 180 mg/dL   Lab Results  Component Value Date   GLUCAP 344 (H) 03/26/2016   HGBA1C 13.2 (H) 02/23/2016    Review of Glycemic Control  Diabetes history: DM2 Outpatient Diabetes medications: None. Previously on 70/30 10 units bid and Lantus 10 units QHS, Novolog s/s Current orders for Inpatient glycemic control: Lantus 10 units QHS, Novolog 0-9 units tidwc  Pt will need affordable insulin (Novolin 70/30) prescribed at discharge d/t financial constraints.  Inpatient Diabetes Program Recommendations:    Please change diet to CHO mod med. If FBS > 180 mg/dL, titrate Lantus. Will likely need meal coverage insulin if eating well.  Will continue to follow. Thank you. Kenneth [name], RD, LDN, CDE Inpatient Diabetes Coordinator [phone]

## 2016-03-26 NOTE — ED Notes (Signed)
Patient yelling screaming that I need a snack I'm a severe diabetic. Patient also yelling I need insulin but refuses to let staff check is CBG. Patient walks back to room and slammed door. Patient given a snack even though he continues to be verbally abusive towards staff. Patient also refuses vital signs at this time. Encouragement provided and safety maintain. Q 15 min safety checks remain in place. and

## 2016-03-27 DIAGNOSIS — F322 Major depressive disorder, single episode, severe without psychotic features: Secondary | ICD-10-CM

## 2016-03-27 DIAGNOSIS — F332 Major depressive disorder, recurrent severe without psychotic features: Secondary | ICD-10-CM | POA: Diagnosis present

## 2016-03-27 DIAGNOSIS — Z833 Family history of diabetes mellitus: Secondary | ICD-10-CM

## 2016-03-27 DIAGNOSIS — F0630 Mood disorder due to known physiological condition, unspecified: Secondary | ICD-10-CM

## 2016-03-27 DIAGNOSIS — R45851 Suicidal ideations: Secondary | ICD-10-CM

## 2016-03-27 DIAGNOSIS — Z79899 Other long term (current) drug therapy: Secondary | ICD-10-CM

## 2016-03-27 LAB — GLUCOSE, CAPILLARY
GLUCOSE-CAPILLARY: 370 mg/dL — AB (ref 65–99)
GLUCOSE-CAPILLARY: 393 mg/dL — AB (ref 65–99)
GLUCOSE-CAPILLARY: 479 mg/dL — AB (ref 65–99)
Glucose-Capillary: 350 mg/dL — ABNORMAL HIGH (ref 65–99)
Glucose-Capillary: 498 mg/dL — ABNORMAL HIGH (ref 65–99)
Glucose-Capillary: 148 mg/dL — ABNORMAL HIGH (ref 65–99)

## 2016-03-27 LAB — TSH: TSH: 0.569 u[IU]/mL (ref 0.350–4.500)

## 2016-03-27 MED ORDER — INSULIN ASPART 100 UNIT/ML ~~LOC~~ SOLN
10.0000 [IU] | Freq: Once | SUBCUTANEOUS | Status: AC
  Administered 2016-03-27: 10 [IU] via SUBCUTANEOUS

## 2016-03-27 MED ORDER — DOXEPIN HCL 25 MG PO CAPS
25.0000 mg | ORAL_CAPSULE | Freq: Every evening | ORAL | Status: DC | PRN
  Administered 2016-03-27 – 2016-03-30 (×4): 25 mg via ORAL
  Filled 2016-03-27 (×14): qty 1

## 2016-03-27 MED ORDER — INSULIN ASPART 100 UNIT/ML ~~LOC~~ SOLN
0.0000 [IU] | Freq: Three times a day (TID) | SUBCUTANEOUS | Status: DC
  Administered 2016-03-28 (×2): 20 [IU] via SUBCUTANEOUS
  Administered 2016-03-28: 15 [IU] via SUBCUTANEOUS
  Administered 2016-03-29: 11 [IU] via SUBCUTANEOUS
  Administered 2016-03-29 – 2016-03-30 (×3): 20 [IU] via SUBCUTANEOUS
  Administered 2016-03-30: 11 [IU] via SUBCUTANEOUS
  Administered 2016-03-30: 20 [IU] via SUBCUTANEOUS

## 2016-03-27 MED ORDER — GABAPENTIN 400 MG PO CAPS
400.0000 mg | ORAL_CAPSULE | Freq: Three times a day (TID) | ORAL | Status: DC
  Administered 2016-03-27 – 2016-04-02 (×16): 400 mg via ORAL
  Filled 2016-03-27 (×27): qty 1

## 2016-03-27 MED ORDER — INSULIN ASPART 100 UNIT/ML ~~LOC~~ SOLN
10.0000 [IU] | Freq: Once | SUBCUTANEOUS | Status: AC
Start: 2016-03-27 — End: 2016-03-27
  Administered 2016-03-27: 10 [IU] via SUBCUTANEOUS

## 2016-03-27 MED ORDER — MELOXICAM 7.5 MG PO TABS
15.0000 mg | ORAL_TABLET | Freq: Every day | ORAL | Status: DC
  Administered 2016-03-27 – 2016-04-06 (×10): 15 mg via ORAL
  Filled 2016-03-27 (×4): qty 1
  Filled 2016-03-27: qty 2
  Filled 2016-03-27 (×4): qty 1
  Filled 2016-03-27: qty 2
  Filled 2016-03-27: qty 14
  Filled 2016-03-27 (×5): qty 1

## 2016-03-27 NOTE — Progress Notes (Addendum)
Data. Patient denies SI/HI/AVH. Patient having minimal interactions with staff or other patients. Spent most of shift in bed. CBGs and insuline administered per MD order. Affect is flat and his mood is irritable. Patient is frequently short and irritated in his responses to staff. He has C/O pain throughout the day and the MD was notified. Patient did not complete a self assessment. At 5:30 pm his CBG was 479. Order received for 10 units novolog insulin x 1 and have CBG rechecked @ 6:30pm. At 6:30  pm CBG was 498. Order received for 10 units of novolog insulin x 1 and to recheck CBG at 7:30pm. On coming nurse notified. Action. Emotional support and encouragement offered. Education provided on medication, indications and side effect. Q 15 minute checks done for safety. Response. Safety on the unit maintained through 15 minute checks.  Medications taken as prescribed.

## 2016-03-27 NOTE — Progress Notes (Signed)
Patient ID: Kenneth RainwaterRandy C Lover, male   DOB: 02/07/1966, 51 y.o.   MRN: [id number]   Report accepted from admitting nurse, Otto Herbreka, RN. Pt currently presents with a flat affect and depressed behavior. Pt supported emotionally and encouraged to express concerns and questions. Pt's safety ensured with 15 minute and environmental checks. Pt currently denies SI/HI and A/V hallucinations. Pt verbally agrees to seek staff if SI/HI or A/VH occurs and to consult with staff before acting on any harmful thoughts. Given snack and liquids. Educated on medications. Pt reports withdrawal symptoms including nausea, anxiety and tremors. Will continue POC.

## 2016-03-27 NOTE — Progress Notes (Signed)
D.  Pt pleasant on approach, stated that he was aware medication changes had been made.  Pt's blood sugar had been very high earlier today and was re-checked at HS as 148.  Pt was ordered 10 units of Lantus and after this was given Pt states that he has seizures if his blood sugar drops too low.  Pt states "I tried to tell you all before".  Renata Capriceonrad, NP was made aware of Pt's statement.  Pt pleased with order for mobic and sinequin, hopeful that these will help with pain and insomnia.  Pt did attend evening AA group, observed interacting in dayroom with peers.  A.  Support and encouragement offered, medication given as ordered  R.  Pt remains safe on the unit, will continue to monitor.

## 2016-03-27 NOTE — Plan of Care (Addendum)
Problem: Coping: Goal: Ability to verbalize feelings will improve Outcome: Not Progressing Patient refuses to discuss his feelings and thought processes. He frequently moves the conversation of these subjects and onto his physical needs.

## 2016-03-27 NOTE — [facility] Group Notes (Signed)
Nurse PsychoEducational Group and Goals review:  Patient did not attend group. 

## 2016-03-27 NOTE — [facility] Suicide Risk Assessment (Signed)
[facility] Admission Suicide Risk Assessment   Nursing information obtained from:  Patient Demographic factors:  Male, Divorced or widowed, Caucasian, Low socioeconomic status, Living alone, Unemployed Current Mental Status:  Suicidal ideation indicated by patient, Suicide plan, Self-harm thoughts, Self-harm behaviors Loss Factors:  Loss of significant relationship, Decline in physical health, Financial problems / change in socioeconomic status Historical Factors:  Family history of suicide, Family history of mental illness or substance abuse Risk Reduction Factors:  Positive social support  Total Time spent with patient: 1.5 hours Principal Problem: <principal problem not specified> Diagnosis:   Patient Active Problem List   Diagnosis Date Noted  . MDD (major depressive disorder), severe ([facility]) [F32.2] 03/26/2016  . Recurrent major depressive disorder ([facility]) [F33.9]   . Abdominal pain [R10.9]   . Hyperglycemia [R73.9] 02/24/2016  . Volume depletion [E86.9] 12/21/2015  . Metabolic acidosis [E87.2] 12/21/2015  . Diabetes mellitus with nonketotic hyperosmolarity (HCC) [E11.00] 09/21/2014  . Noncompliance with medication regimen [Z91.14]   . Diabetes mellitus ([facility]) [E11.9]   . DKA (diabetic ketoacidoses) ([facility]) [E13.10] 07/24/2014  . Dehydration [E86.0] 07/24/2014  . Acute renal failure ([facility]) [N17.9] 07/24/2014  . Hyperkalemia [E87.5] 07/24/2014  . Hyponatremia [E87.1] 07/24/2014  . Hyperthyroidism [E05.90] 07/24/2014   Subjective Data: Alert oriented, stressed and overwhelmed.  Continued Clinical Symptoms:  Alcohol Use Disorder Identification Test Final Score (AUDIT): 0 The "Alcohol Use Disorders Identification Test", Guidelines for Use in Primary Care, Second Edition.  World Science writer [facility]). Score between 0-7:  no or low risk or alcohol related problems. Score between 8-15:  moderate risk of alcohol related problems. Score between 16-19:  high risk of alcohol related  problems. Score 20 or above:  warrants further diagnostic evaluation for alcohol dependence and treatment.   CLINICAL FACTORS:   Depression:   Anhedonia Hopelessness Impulsivity Alcohol/Substance Abuse/Dependencies More than one psychiatric diagnosis Unstable or Poor Therapeutic Relationship    Psychiatric Specialty Exam: Physical Exam  HENT:  Head: Normocephalic.    Review of Systems  Cardiovascular: Negative for chest pain.  Gastrointestinal: Negative for nausea.  Psychiatric/Behavioral: Positive for depression. The patient is nervous/anxious.     Blood pressure 101/70, pulse 72, temperature 98.4 F (36.9 C), resp. rate 18, height 5\' 9"  (1.753 m), weight 59 kg (130 lb), SpO2 99 %.Body mass index is 19.2 kg/m.  General Appearance: Disheveled  Eye Contact:  Fair  Speech:  Slow  Volume:  Decreased  Mood:  Dysphoric  Affect:  Constricted  Thought Process:  Goal Directed  Orientation:  Full (Time, Place, and Person)  Thought Content:  Logical  Suicidal Thoughts:  No  Homicidal Thoughts:  No  Memory:  Immediate;   Fair Recent;   Fair  Judgement:  Poor  Insight:  Shallow  Psychomotor Activity:  Decreased  Concentration:  Concentration: Fair and Attention Span: Fair  Recall:  [employer] of Knowledge:  Fair  Language:  Fair  Akathisia:  Negative  Handed:  Right  AIMS (if indicated):     Assets:  Desire for Improvement  ADL's:  Intact  Cognition:  WNL  Sleep:  Number of Hours: 6.25      COGNITIVE FEATURES THAT CONTRIBUTE TO RISK:  Closed-mindedness and Polarized thinking    SUICIDE RISK:  Moderate suicide risk and feeling of hopelessness , despair and depressed  PLAN OF CARE: Admit for stabilization. Medication management and depression.   I certify that inpatient services furnished can reasonably be expected to improve the patient's condition.  Thresa Ross, MD  03/27/2016, 11:31 AM

## 2016-03-27 NOTE — [facility] Group Notes (Signed)
  [facility] LCSW Group Therapy Note  Date and  10:00 to 11:10 AM  Type of Therapy and Topic:  Group Therapy: Avoiding Self-Sabotaging and Enabling Behaviors  Participation Level:  Did Not Attend; invited to participate yet did not despite overhead announcement and encouragement by staff   Raynold Blankenbaker C Shi Blankenship, LCSW 
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
## 2016-03-27 NOTE — [facility] Suicide Risk Assessment (Signed)
[facility] INPATIENT:  Family/Significant Other Suicide Prevention Education  Suicide Prevention Education:  Patient Refusal for Family/Significant Other Suicide Prevention Education: The patient Kenneth [name] has refused to provide written consent for family/significant other to be provided Family/Significant Other Suicide Prevention Education during admission and/or prior to discharge.  Physician notified.  Pt reports that he has no access to weapons in his home.  Carloyn JaegerMareida J Grossman-Orr 03/27/2016, 12:35 PM

## 2016-03-27 NOTE — Progress Notes (Signed)
The patient attended this evening's A. A. Meeting and was appropriate.  

## 2016-03-28 DIAGNOSIS — Z794 Long term (current) use of insulin: Secondary | ICD-10-CM

## 2016-03-28 DIAGNOSIS — F332 Major depressive disorder, recurrent severe without psychotic features: Principal | ICD-10-CM

## 2016-03-28 LAB — GLUCOSE, CAPILLARY
GLUCOSE-CAPILLARY: 373 mg/dL — AB (ref 65–99)
GLUCOSE-CAPILLARY: 331 mg/dL — AB (ref 65–99)
Glucose-Capillary: 385 mg/dL — ABNORMAL HIGH (ref 65–99)
Glucose-Capillary: 338 mg/dL — ABNORMAL HIGH (ref 65–99)

## 2016-03-28 NOTE — Plan of Care (Signed)
Problem: Medication: Goal: Compliance with prescribed medication regimen will improve Outcome: Progressing Pt has been compliant with medication regimen   

## 2016-03-28 NOTE — Progress Notes (Signed)
[facility] MD Progress Note  03/28/2016 2:43 PM Kenneth [name]  MRN:  [id number]  Subjective: Kenneth [name] reports, "I'm hurting all over. My mood is not very good, I tell you. I'm not coming to groups because I feel like I need some rest, I'm sorry. I got diabetes. I'm sure not having withdrawal symptoms".  Objective: Patient is seen and chart is reviewed. He is reporting symptoms of depression & generalized aches & pains. Kenneth [name] is not attending group sessions. He walks with walker for support. He was provided with encouragement to come out of his room during group.  Kenneth [name] was receptive of the support provided. He was cooperative with assessment and appears to be interacting well. He denies any adverse reactions from the medications and is compliant with his regimen. He is not responding to internal stimuli. Denies any SIHI, AVH.  Principal Problem: MDD (major depressive disorder), recurrent severe, without psychosis (HCC)  Diagnosis:   Patient Active Problem List   Diagnosis Date Noted  . MDD (major depressive disorder), recurrent severe, without psychosis (HCC) [F33.2] 03/27/2016  . Recurrent major depressive disorder (HCC) [F33.9]   . Abdominal pain [R10.9]   . Hyperglycemia [R73.9] 02/24/2016  . Volume depletion [E86.9] 12/21/2015  . Metabolic acidosis [E87.2] 12/21/2015  . Diabetes mellitus with nonketotic hyperosmolarity (HCC) [E11.00] 09/21/2014  . Noncompliance with medication regimen [Z91.14]   . Diabetes mellitus (HCC) [E11.9]   . DKA (diabetic ketoacidoses) (HCC) [E13.10] 07/24/2014  . Dehydration [E86.0] 07/24/2014  . Acute renal failure (HCC) [N17.9] 07/24/2014  . Hyperkalemia [E87.5] 07/24/2014  . Hyponatremia [E87.1] 07/24/2014  . Hyperthyroidism [E05.90] 07/24/2014   Total Time spent with patient: 25 minutes  Past Psychiatric History: Major depression.  Past Medical History:  Past Medical History:  Diagnosis Date  . Arthritis   . Diabetes mellitus   . DKA (diabetic  ketoacidosis) (HCC) 07/2014  . Malnutrition (HCC)   . Noncompliance with medication regimen   . Thyroid disease    History reviewed. No pertinent surgical history.  Family History:  Family History  Problem Relation Age of Onset  . Diabetes Mother   . Seizures Sister   . Diabetes Sister    Family Psychiatric  History: See H&P  Social History:  History  Alcohol Use No     History  Drug Use No    Social History   Social History  . Marital status: Divorced    Spouse name: N/A  . Number of children: N/A  . Years of education: N/A   Social History Main Topics  . Smoking status: Current Every Day Smoker    Packs/day: 0.50    Years: 8.00  . Smokeless tobacco: Never Used  . Alcohol use No  . Drug use: No  . Sexual activity: Not Asked   Other Topics Concern  . None   Social History Narrative  . None   Additional Social History:   Sleep: Fair  Appetite:  Fair  Current Medications: Current Facility-Administered Medications  Medication Dose Route Frequency Provider Last Rate Last Dose  . acetaminophen (TYLENOL) tablet 650 mg  650 mg Oral Q6H PRN Truman Hayward, FNP   650 mg at 03/27/16 7829  . alum & mag hydroxide-simeth (MAALOX/MYLANTA) 200-200-20 MG/5ML suspension 30 mL  30 mL Oral Q4H PRN Truman Hayward, FNP      . doxepin (SINEQUAN) capsule 25 mg  25 mg Oral QHS,MR X 1 Beau Fanny, FNP   25 mg at 03/27/16 2124  . FLUoxetine (PROZAC) capsule  20 mg  20 mg Oral Daily Truman Haywardakia S Starkes, FNP   20 mg at 03/28/16 1002  . gabapentin (NEURONTIN) capsule 400 mg  400 mg Oral TID Thresa RossNadeem Cederick Broadnax, MD   400 mg at 03/28/16 1420  . hydrOXYzine (ATARAX/VISTARIL) tablet 25 mg  25 mg Oral TID PRN Truman Haywardakia S Starkes, FNP   25 mg at 03/27/16 2124  . insulin aspart (novoLOG) injection 0-20 Units  0-20 Units Subcutaneous TID WC Beau FannyJohn C Withrow, FNP   15 Units at 03/28/16 1202  . insulin glargine (LANTUS) injection 10 Units  10 Units Subcutaneous QHS Truman Haywardakia S Starkes, FNP   10 Units at  03/27/16 2128  . magnesium hydroxide (MILK OF MAGNESIA) suspension 30 mL  30 mL Oral Daily PRN Truman Haywardakia S Starkes, FNP      . meloxicam (MOBIC) tablet 15 mg  15 mg Oral Daily Beau FannyJohn C Withrow, FNP   15 mg at 03/28/16 1003   Lab Results:  Results for orders placed or performed during the hospital encounter of 03/26/16 (from the past 48 hour(s))  Glucose, capillary     Status: Abnormal   Collection Time: 03/26/16  7:42 PM  Result Value Ref Range   Glucose-Capillary 374 (H) 65 - 99 mg/dL  Glucose, capillary     Status: Abnormal   Collection Time: 03/27/16  6:48 AM  Result Value Ref Range   Glucose-Capillary 370 (H) 65 - 99 mg/dL  TSH     Status: None   Collection Time: 03/27/16  6:57 AM  Result Value Ref Range   TSH 0.569 0.350 - 4.500 uIU/mL    Comment: Performed by a 3rd Generation assay with a functional sensitivity of <=0.01 uIU/mL. [facility]   Glucose, capillary     Status: Abnormal   Collection Time: 03/27/16 12:01 PM  Result Value Ref Range   Glucose-Capillary 393 (H) 65 - 99 mg/dL   Comment 1 Notify RN   Glucose, capillary     Status: Abnormal   Collection Time: 03/27/16  5:28 PM  Result Value Ref Range   Glucose-Capillary 479 (H) 65 - 99 mg/dL   Comment 1 Notify RN    Comment 2 Document in Chart   Glucose, capillary     Status: Abnormal   Collection Time: 03/27/16  6:30 PM  Result Value Ref Range   Glucose-Capillary 498 (H) 65 - 99 mg/dL  Glucose, capillary     Status: Abnormal   Collection Time: 03/27/16  7:29 PM  Result Value Ref Range   Glucose-Capillary 350 (H) 65 - 99 mg/dL   Comment 1 Notify RN   Glucose, capillary     Status: Abnormal   Collection Time: 03/27/16  9:19 PM  Result Value Ref Range   Glucose-Capillary 148 (H) 65 - 99 mg/dL  Glucose, capillary     Status: Abnormal   Collection Time: 03/28/16  6:01 AM  Result Value Ref Range   Glucose-Capillary 373 (H) 65 - 99 mg/dL  Glucose, capillary     Status: Abnormal    Collection Time: 03/28/16 11:50 AM  Result Value Ref Range   Glucose-Capillary 331 (H) 65 - 99 mg/dL   Comment 1 Notify RN    Comment 2 Document in Chart    Blood Alcohol level:  Lab Results  Component Value Date   ETH <5 03/25/2016   ETH <5 09/21/2014   Metabolic Disorder Labs: Lab Results  Component Value Date   HGBA1C 13.2 (H) 02/23/2016   MPG  332 02/23/2016   MPG 286 07/24/2014   No results found for: PROLACTIN No results found for: CHOL, TRIG, HDL, CHOLHDL, VLDL, LDLCALC  Physical Findings: AIMS: Facial and Oral Movements Muscles of Facial Expression: None, normal Lips and Perioral Area: None, normal Jaw: None, normal Tongue: None, normal,Extremity Movements Upper (arms, wrists, hands, fingers): None, normal Lower (legs, knees, ankles, toes): None, normal, Trunk Movements Neck, shoulders, hips: None, normal, Overall Severity Severity of abnormal movements (highest score from questions above): None, normal Incapacitation due to abnormal movements: None, normal Patient's awareness of abnormal movements (rate only patient's report): No Awareness, Dental Status Current problems with teeth and/or dentures?: Yes Does patient usually wear dentures?: No  CIWA:    COWS:     Musculoskeletal: Strength & Muscle Tone: within normal limits Gait & Station: normal Patient leans: N/A  Psychiatric Specialty Exam: Physical Exam  ROS  Blood pressure 105/66, pulse 63, temperature 97.6 F (36.4 C), resp. rate 17, height 5\' 9"  (1.753 m), weight 59 kg (130 lb), SpO2 99 %.Body mass index is 19.2 kg/m.  General Appearance: Casual and Fairly Groomed  Eye Contact:  Good  Speech:  Clear and Coherent and Normal Rate  Volume:  Normal  Mood:  Anxious  Affect:  Appropriate and Congruent  Thought Process:  Coherent, Goal Directed, Linear and Descriptions of Associations: Intact  Orientation:  Full (Time, Place, and Person)  Thought Content:  Focused on back pain, depression, anxiety   Suicidal Thoughts:  Yes.  without intent/plan  Homicidal Thoughts:  No  Memory:  Immediate;   Fair Recent;   Fair Remote;   Fair  Judgement:  Fair  Insight:  Fair  Psychomotor Activity:  Normal  Concentration:  Concentration: Fair and Attention Span: Fair  Recall:  [employer] of Knowledge:  Fair  Language:  Fair  Akathisia:  No  Handed:    AIMS (if indicated):     Assets:  Communication Skills Desire for Improvement Resilience Social Support  ADL's:  Intact  Cognition:  WNL  Sleep:  Number of Hours: 6.0     Treatment Plan Summary:  Daily contact with patient to assess and evaluate symptoms and progress in treatment and Medication management  Reviewed past medical records & treatment plan.   For Depressed mood/anxiety: Will continue Prozac 20 mg po daily.  For agitation/diabetic neuropathy: Will continue the Neurontin 400 mg.  For Anxiety disorder: Will continue Doxepin 25  mg po Q hs prn. Will continue Hydroxyzine 25 mg tid prn  For insomnia: Will continue Doxepin  25 mg po qhs prn.  Other medical issues & concerns: Will continue all home medication regimen as recommended (see MAR)  - Continue 15 minutes observation for safety concerns - Encouraged to participate in milieu therapy and group therapy counseling sessions and also work with coping skills -  Develop treatment plan to reduce the need for readmission. -  Psycho-social education regarding self care. - Health care follow up as needed for medical problems. - Restart home medications where appropriate.  Sanjuana Kava, NP, PMHNP, FNP-BC 03/28/2016, 2:43 PM  I agree to above

## 2016-03-28 NOTE — [facility] Group Notes (Signed)
[facility] Group Notes:  (Nursing/MHT/Case Management/Adjunct)  Date:  03/28/2016  Time:  2:37 PM  Type of Therapy:  Nurse Education  Participation Level:  Did Not Attend   Almira Barenny G Vishnu Moeller 03/28/2016, 2:37 PM

## 2016-03-28 NOTE — [facility] Group Notes (Signed)
[facility] LCSW Group Therapy  03/28/2016 10 - 10:45 AM  Type of Therapy:  Group Therapy  Participation Level:  Did Not Attend; invited to participate yet did not despite overhead announcement and encouragement by staff   Catherine C Harrill, LCSW 

## 2016-03-28 NOTE — Progress Notes (Signed)
Data. Patient denies SI/HI/AVH. Patient has been isolating in his bed throughout shift. He has gotten up for his meals. His affect has been closed and his mood irritable. Patient has made only one word answers to questions and has often been sarcastic. Continues to use a walker for ambulation due to his neuropathy and unsteadiness. Patient did not complete his self assessment this shift. Action. Emotional support and encouragement offered. Education provided on medication, indications and side effect. Q 15 minute checks done for safety. Response. Safety on the unit maintained through 15 minute checks.  Medications taken as prescribed.

## 2016-03-28 NOTE — Plan of Care (Signed)
Problem: Education: Goal: Knowledge of the prescribed therapeutic regimen will improve Outcome: Not Progressing Patient refusing education of his medications at this time.

## 2016-03-28 NOTE — Progress Notes (Signed)
Psychoeducational Group Note  Date:  03/28/2016 Time:  2227  Group Topic/Focus:  Wrap-Up Group:   The focus of this group is to help patients review their daily goal of treatment and discuss progress on daily workbooks.   Participation Level: Did Not Attend  Participation Quality:  Not Applicable  Affect:  Not Applicable  Cognitive:  Not Applicable  Insight:  Not Applicable  Engagement in Group: Not Applicable  Additional Comments: The patient did not attend group since he was asleep in his bed.   Hazle CocaGOODMAN, Jaquavian Firkus S 03/28/2016, 10:27 PM

## 2016-03-28 NOTE — Progress Notes (Signed)
D.  Pt states he has not felt well all day, states he felt sleeping and fatigued.  Pt refused sleep medication tonight in hopes of feeling more energetic tomorrow.  Pt's blood sugar continues to be high, in need of diabetic consult.  Pt did not feel well enough to attend evening group.  Pt was very upset that mobic was ordered daily and he could not get another dose.  Pt refused Tylenol and wishes and to speak to doctor tomorrow.  Pt denies SI/HI/hallucinations at this time.  A.  Support and encouragement offered, medication given as ordered  R. PT remains safe on the unit, will continue to monitor.

## 2016-03-29 DIAGNOSIS — Z8489 Family history of other specified conditions: Secondary | ICD-10-CM

## 2016-03-29 DIAGNOSIS — R109 Unspecified abdominal pain: Secondary | ICD-10-CM

## 2016-03-29 DIAGNOSIS — F17210 Nicotine dependence, cigarettes, uncomplicated: Secondary | ICD-10-CM

## 2016-03-29 LAB — GLUCOSE, CAPILLARY
GLUCOSE-CAPILLARY: 498 mg/dL — AB (ref 65–99)
Glucose-Capillary: 296 mg/dL — ABNORMAL HIGH (ref 65–99)
Glucose-Capillary: 406 mg/dL — ABNORMAL HIGH (ref 65–99)
Glucose-Capillary: 248 mg/dL — ABNORMAL HIGH (ref 65–99)

## 2016-03-29 LAB — PROLACTIN: Prolactin: 7.8 ng/mL (ref 4.0–15.2)

## 2016-03-29 LAB — CBG MONITORING, ED
Glucose-Capillary: 393 mg/dL — ABNORMAL HIGH (ref 65–99)
Glucose-Capillary: 205 mg/dL — ABNORMAL HIGH (ref 65–99)

## 2016-03-29 MED ORDER — INSULIN GLARGINE 100 UNIT/ML ~~LOC~~ SOLN
15.0000 [IU] | Freq: Every day | SUBCUTANEOUS | Status: DC
  Administered 2016-03-29: 15 [IU] via SUBCUTANEOUS

## 2016-03-29 MED FILL — !NOVOLOG 100UNITS/ML VIAL: 100/ML | 30 days supply | Qty: 10 | Fill #0

## 2016-03-29 NOTE — Progress Notes (Signed)
[facility] MD Progress Note  03/29/2016 3:26 PM Kenneth Kenneth [name] Kenneth [name]  MRN:  [id number]  Subjective: Patient reports " I am  not having a good day. I keep getting rejected for my disability and I am in a lot of pain".  Objective: Kenneth Kenneth [name] is awake, alert and oriented *3. seen attending group session for grief.  Denies suicidal or homicidal ideation. Denies auditory or visual hallucination and does not appear to be responding to internal stimuli. Patient seen using a walker for safety. Patient is irritable with a flat affect. Patient reports he is medication compliant without mediation side effects.  States his depression 10/10.  Reports fair appetite, patient reports he's not sure why is unable to have glycerin supplement.(CBG 430) patient reports resting well.Support, encouragement and reassurance was provided.   Principal Problem: MDD (major depressive disorder), recurrent severe, without psychosis (HCC)  Diagnosis:   Patient Active Problem List   Diagnosis Date Noted  . MDD (major depressive disorder), recurrent severe, without psychosis (HCC) [F33.2] 03/27/2016  . Recurrent major depressive disorder (HCC) [F33.9]   . Abdominal pain [R10.9]   . Hyperglycemia [R73.9] 02/24/2016  . Volume depletion [E86.9] 12/21/2015  . Metabolic acidosis [E87.2] 12/21/2015  . Diabetes mellitus with nonketotic hyperosmolarity (HCC) [E11.00] 09/21/2014  . Noncompliance with medication regimen [Z91.14]   . Diabetes mellitus (HCC) [E11.9]   . DKA (diabetic ketoacidoses) (HCC) [E13.10] 07/24/2014  . Dehydration [E86.0] 07/24/2014  . Acute renal failure (HCC) [N17.9] 07/24/2014  . Hyperkalemia [E87.5] 07/24/2014  . Hyponatremia [E87.1] 07/24/2014  . Hyperthyroidism [E05.90] 07/24/2014   Total Time spent with patient: 25 minutes  Past Psychiatric History: Major depression.  Past Medical History:  Past Medical History:  Diagnosis Date  . Arthritis   . Diabetes mellitus   . DKA (diabetic ketoacidosis) (HCC)  07/2014  . Malnutrition (HCC)   . Noncompliance with medication regimen   . Thyroid disease    History reviewed. No pertinent surgical history.  Family History:  Family History  Problem Relation Age of Onset  . Diabetes Mother   . Seizures Sister   . Diabetes Sister    Family Psychiatric  History: See H&P  Social History:  History  Alcohol Use No     History  Drug Use No    Social History   Social History  . Marital status: Divorced    Spouse name: N/A  . Number of children: N/A  . Years of education: N/A   Social History Main Topics  . Smoking status: Current Every Day Smoker    Packs/day: 0.50    Years: 8.00  . Smokeless tobacco: Never Used  . Alcohol use No  . Drug use: No  . Sexual activity: Not Asked   Other Topics Concern  . None   Social History Narrative  . None   Additional Social History:   Sleep: Fair  Appetite:  Fair  Current Medications: Current Facility-Administered Medications  Medication Dose Route Frequency Provider Last Rate Last Dose  . acetaminophen (TYLENOL) tablet 650 mg  650 mg Oral Q6H PRN Truman Hayward, FNP   650 mg at 03/27/16 4098  . alum & mag hydroxide-simeth (MAALOX/MYLANTA) 200-200-20 MG/5ML suspension 30 mL  30 mL Oral Q4H PRN Truman Hayward, FNP      . doxepin (SINEQUAN) capsule 25 mg  25 mg Oral QHS,MR X 1 Beau Fanny, FNP   25 mg at 03/27/16 2124  . FLUoxetine (PROZAC) capsule 20 mg  20 mg Oral Daily Fredna Dow  Ambrose Mantle, FNP   20 mg at 03/29/16 0830  . gabapentin (NEURONTIN) capsule 400 mg  400 mg Oral TID Thresa Ross, MD   400 mg at 03/29/16 1213  . hydrOXYzine (ATARAX/VISTARIL) tablet 25 mg  25 mg Oral TID PRN Truman Hayward, FNP   25 mg at 03/27/16 2124  . insulin aspart (novoLOG) injection 0-20 Units  0-20 Units Subcutaneous TID WC Beau Fanny, FNP   11 Units at 03/29/16 1214  . insulin glargine (LANTUS) injection 15 Units  15 Units Subcutaneous QHS Oneta Rack, NP      . magnesium hydroxide (MILK OF  MAGNESIA) suspension 30 mL  30 mL Oral Daily PRN Truman Hayward, FNP      . meloxicam (MOBIC) tablet 15 mg  15 mg Oral Daily Beau Fanny, FNP   15 mg at 03/29/16 0830   Lab Results:  Results for orders placed or performed during the hospital encounter of 03/26/16 (from the past 48 hour(s))  Glucose, capillary     Status: Abnormal   Collection Time: 03/27/16  5:28 PM  Result Value Ref Range   Glucose-Capillary 479 (H) 65 - 99 mg/dL   Comment 1 Notify RN    Comment 2 Document in Chart   Glucose, capillary     Status: Abnormal   Collection Time: 03/27/16  6:30 PM  Result Value Ref Range   Glucose-Capillary 498 (H) 65 - 99 mg/dL  Glucose, capillary     Status: Abnormal   Collection Time: 03/27/16  7:29 PM  Result Value Ref Range   Glucose-Capillary 350 (H) 65 - 99 mg/dL   Comment 1 Notify RN   Glucose, capillary     Status: Abnormal   Collection Time: 03/27/16  9:19 PM  Result Value Ref Range   Glucose-Capillary 148 (H) 65 - 99 mg/dL  Glucose, capillary     Status: Abnormal   Collection Time: 03/28/16  6:01 AM  Result Value Ref Range   Glucose-Capillary 373 (H) 65 - 99 mg/dL  Glucose, capillary     Status: Abnormal   Collection Time: 03/28/16 11:50 AM  Result Value Ref Range   Glucose-Capillary 331 (H) 65 - 99 mg/dL   Comment 1 Notify RN    Comment 2 Document in Chart   Glucose, capillary     Status: Abnormal   Collection Time: 03/28/16  5:06 PM  Result Value Ref Range   Glucose-Capillary 385 (H) 65 - 99 mg/dL   Comment 1 Notify RN    Comment 2 Document in Chart   Glucose, capillary     Status: Abnormal   Collection Time: 03/28/16  8:48 PM  Result Value Ref Range   Glucose-Capillary 338 (H) 65 - 99 mg/dL   Comment 1 Notify RN   Glucose, capillary     Status: Abnormal   Collection Time: 03/29/16  5:46 AM  Result Value Ref Range   Glucose-Capillary 498 (H) 65 - 99 mg/dL   Comment 1 Notify RN   Glucose, capillary     Status: Abnormal   Collection Time: 03/29/16 12:08  PM  Result Value Ref Range   Glucose-Capillary 296 (H) 65 - 99 mg/dL   Blood Alcohol level:  Lab Results  Component Value Date   ETH <5 03/25/2016   ETH <5 09/21/2014   Metabolic Disorder Labs: Lab Results  Component Value Date   HGBA1C 13.2 (H) 02/23/2016   MPG 332 02/23/2016   MPG 286 07/24/2014   Lab Results  Component Value Date   PROLACTIN 7.8 03/27/2016   No results found for: CHOL, TRIG, HDL, CHOLHDL, VLDL, LDLCALC  Physical Findings: AIMS: Facial and Oral Movements Muscles of Facial Expression: None, normal Lips and Perioral Area: None, normal Jaw: None, normal Tongue: None, normal,Extremity Movements Upper (arms, wrists, hands, fingers): None, normal Lower (legs, knees, ankles, toes): None, normal, Trunk Movements Neck, shoulders, hips: None, normal, Overall Severity Severity of abnormal movements (highest score from questions above): None, normal Incapacitation due to abnormal movements: None, normal Patient's awareness of abnormal movements (rate only patient's report): No Awareness, Dental Status Current problems with teeth and/or dentures?: Yes Does patient usually wear dentures?: No  CIWA:    COWS:     Musculoskeletal: Strength & Muscle Tone: within normal limits Gait & Station: normal- walker for ambulation assistance Patient leans: N/A  Psychiatric Specialty Exam: Physical Exam  Nursing note and vitals reviewed. Constitutional: He is oriented to person, place, and time. He appears well-developed.  HENT:  Head: Normocephalic.  Neurological: He is alert and oriented to person, place, and time.  Psychiatric: He has a normal mood and affect. His behavior is normal.    Review of Systems  Psychiatric/Behavioral: Positive for depression. The patient is nervous/anxious and has insomnia.     Blood pressure 98/75, pulse 76, temperature 98.6 F (37 C), temperature source Oral, resp. rate 16, height 5\' 9"  (1.753 m), weight 59 kg (130 lb), SpO2 99 %.Body  mass index is 19.2 kg/m.  General Appearance:Fairly Groomed  Eye Contact:  Good  Speech:  Clear and Coherent and Normal Rate  Volume:  Normal  Mood:  Anxious  Affect:  Appropriate and Congruent  Thought Process:  Coherent, Goal Directed, Linear and Descriptions of Associations: Intact  Orientation:  Full (Time, Place, and Person)  Thought Content:  Focused on back pain, depression, anxiety  Suicidal Thoughts:  Yes.  without intent/plan  Homicidal Thoughts:  No  Memory:  Immediate;   Fair Recent;   Fair Remote;   Fair  Judgement:  Fair  Insight:  Fair  Psychomotor Activity:  Normal  Concentration:  Concentration: Fair and Attention Span: Fair  Recall:  [employer] of Knowledge:  Fair  Language:  Fair  Akathisia:  No  Handed:    AIMS (if indicated):     Assets:  Communication Skills Desire for Improvement Resilience Social Support  ADL's:  Intact  Cognition:  WNL  Sleep:  Number of Hours: 6.0      I agree with current treatment plan on 01/08//2018, Patient seen face-to-face for psychiatric evaluation follow-up, chart reviewed. Reviewed the information documented and agree with the treatment plan.  Treatment Plan Summary:  Daily contact with patient to assess and evaluate symptoms and progress in treatment and Medication management  For Depressed mood/anxiety: Will continue Prozac 20 mg po daily.  For agitation/diabetic neuropathy: Will continue the Neurontin 400 mg.  For Anxiety disorder: Will continue Doxepin 25  mg po Q hs prn. Will continue Hydroxyzine 25 mg tid prn  For insomnia: Will continue Doxepin  25 mg po qhs prn.  Other medical issues & concerns: Will continue all home medication regimen as recommended (see MAR)  - Continue 15 minutes observation for safety concerns - Encouraged to participate in milieu therapy and group therapy counseling sessions and also work with coping skills -  Develop treatment plan to reduce the need for readmission. -   Psycho-social education regarding self care. - Health care follow up as needed for medical  problems. - Restart home medications where appropriate.  Oneta Rackanika N Lewis, NP 03/29/2016,   Agree with NP Progress Note

## 2016-03-29 NOTE — Progress Notes (Signed)
The patient attended this evening's Wrap-Up group and stated that he met with a few of his peers. In addition, he verbalized that he enjoyed attending two of the groups. As for the theme of the day, his wellness strategy will be to "help myself" and to address his diabetic issues.

## 2016-03-29 NOTE — [facility] Group Notes (Signed)
[facility] LCSW Group Therapy  03/29/2016 3:36 PM  Type of Therapy:  Group Therapy  Participation Level:  Did Not Attend-pt invited. Chose to remain in bed.   Summary of Progress/Problems: Today's Topic: Overcoming Obstacles. Patients identified one short term goal and potential obstacles in reaching this goal. Patients processed barriers involved in overcoming these obstacles. Patients identified steps necessary for overcoming these obstacles and explored motivation (internal and external) for facing these difficulties head on.   Kenneth [name] N Smart LCSW 03/29/2016, 3:36 PM

## 2016-03-29 NOTE — Progress Notes (Signed)
DAR NOTE: Patient presents with irritable affect and depressed mood.  Denies pain, auditory and visual hallucinations.  Rates depression at 4, hopelessness at 2, and anxiety at 5.  Maintained on routine safety checks.  Medications given as prescribed.  Support and encouragement offered as needed.  Patient ambulatory on the unit with standard walker.  Patient lack insight and is unwilling to follow diabetic recommendation regarding   blood glucose control.

## 2016-03-29 NOTE — Progress Notes (Signed)
Recreation Therapy Notes  Date: 03/29/16 Time: 0930 Location: [address] Group Room  Group Topic: Stress Management  Goal Area(s) Addresses:  Patient will verbalize importance of using healthy stress management.  Patient will identify positive emotions associated with healthy stress management.   Behavioral Response: Engaged  Intervention: Stress Management   Activity :  Body Scan Meditation.  LRT introduced the stress management technique of meditation to patients.  LRT played a meditation from the Calm app to allow patients the opportunity to engage in the meditation.  Patients were to follow along as the meditation played.  Education:  Stress Management, Discharge Planning.   Education Outcome: Acknowledges edcuation/In group clarification offered/Needs additional education  Clinical Observations/Feedback: Pt attended group.    Simrit Gohlke, LRT/CTRS         Rosabella Edgin A 03/29/2016 12:10 PM 

## 2016-03-29 NOTE — Tx Team (Signed)
Interdisciplinary Treatment and Diagnostic Plan Update  03/29/2016 Time of Session: 9:30AM Kenneth [name] MRN: [id number]  Principal Diagnosis: MDD (major depressive disorder), recurrent severe, without psychosis ([place])  Secondary Diagnoses: Principal Problem:   MDD (major depressive disorder), recurrent severe, without psychosis ([place])   Current Medications:  Current Facility-Administered Medications  Medication Dose Route Frequency Provider Last Rate Last Dose  . acetaminophen (TYLENOL) tablet 650 mg  650 mg Oral Q6H PRN [name], FNP   650 mg at 03/27/16 7616  . alum & mag hydroxide-simeth (MAALOX/MYLANTA) 200-200-20 MG/5ML suspension 30 mL  30 mL Oral Q4H PRN [name], FNP      . doxepin (SINEQUAN) capsule 25 mg  25 mg Oral QHS,MR X 1 [name], FNP   25 mg at 03/27/16 2124  . FLUoxetine (PROZAC) capsule 20 mg  20 mg Oral Daily [name], FNP   20 mg at 03/29/16 0830  . gabapentin (NEURONTIN) capsule 400 mg  400 mg Oral TID [name], MD   400 mg at 03/29/16 0830  . hydrOXYzine (ATARAX/VISTARIL) tablet 25 mg  25 mg Oral TID PRN [name], FNP   25 mg at 03/27/16 2124  . insulin aspart (novoLOG) injection 0-20 Units  0-20 Units Subcutaneous TID WC [name], FNP   20 Units at 03/29/16 0555  . insulin glargine (LANTUS) injection 10 Units  10 Units Subcutaneous QHS [name], FNP   10 Units at 03/28/16 2200  . magnesium hydroxide (MILK OF MAGNESIA) suspension 30 mL  30 mL Oral Daily PRN [name], FNP      . meloxicam (MOBIC) tablet 15 mg  15 mg Oral Daily [name], FNP   15 mg at 03/29/16 0830   PTA Medications: Prescriptions Prior to Admission  Medication Sig Dispense Refill Last Dose  . acetaminophen-codeine (TYLENOL #3) 300-30 MG tablet Take 1 tablet by mouth every 6 (six) hours as needed for moderate pain. (Patient not taking: Reported on 03/25/2016) 30 tablet 0 Not Taking at Unknown time  . Blood Glucose Monitoring Suppl (TRUE  METRIX AIR GLUCOSE METER) w/Device KIT 1 each by Does not apply route 4 (four) times daily -  before meals and at bedtime. 1 kit 0   . FLUoxetine (PROZAC) 20 MG capsule Take 1 capsule (20 mg total) by mouth daily. 30 capsule 2 03/25/2016 at Unknown time  . gabapentin (NEURONTIN) 300 MG capsule Take 1 capsule (300 mg total) by mouth 3 (three) times daily. 90 capsule 2 03/25/2016 at Unknown time  . glucose blood (TRUE METRIX BLOOD GLUCOSE TEST) test strip Use as instructed 100 each 12   . insulin aspart (NOVOLOG) 100 unit/mL injection Per sliding scale 1 vial 12   . insulin glargine (LANTUS) 100 UNIT/ML injection Inject 0.1 mLs (10 Units total) into the skin at bedtime. 10 mL 11   . Insulin Syringes, Disposable, U-100 0.5 ML MISC 1 each by Does not apply route 4 (four) times daily -  before meals and at bedtime. (Patient not taking: Reported on 03/25/2016) 100 each 5 Not Taking at Unknown time    Patient Stressors: Financial difficulties Health problems Substance abuse  Patient Strengths: Ability for insight Agricultural engineer for treatment/growth Supportive family/friends  Treatment Modalities: Medication Management, Group therapy, Case management,  1 to 1 session with clinician, Psychoeducation, Recreational therapy.   Physician Treatment Plan for Primary Diagnosis: MDD (major depressive disorder), recurrent severe, without psychosis ([place]) Long Term Goal(s): Improvement  in symptoms so as ready for discharge Improvement in symptoms so as ready for discharge   Short Term Goals: Ability to identify changes in lifestyle to reduce recurrence of condition will improve Ability to verbalize feelings will improve Ability to disclose and discuss suicidal ideas Ability to demonstrate self-control will improve Ability to identify and develop effective coping behaviors will improve Ability to maintain clinical measurements within normal limits will improve Compliance with prescribed  medications will improve Ability to identify triggers associated with substance abuse/mental health issues will improve Ability to identify changes in lifestyle to reduce recurrence of condition will improve Ability to verbalize feelings will improve Ability to disclose and discuss suicidal ideas Ability to demonstrate self-control will improve Ability to identify and develop effective coping behaviors will improve Ability to maintain clinical measurements within normal limits will improve Compliance with prescribed medications will improve Ability to identify triggers associated with substance abuse/mental health issues will improve  Medication Management: Evaluate patient's response, side effects, and tolerance of medication regimen.  Therapeutic Interventions: 1 to 1 sessions, Unit Group sessions and Medication administration.  Evaluation of Outcomes: Progressing  Physician Treatment Plan for Secondary Diagnosis: Principal Problem:   MDD (major depressive disorder), recurrent severe, without psychosis (H[facility])  Long Term Goal(s): Improvement in symptoms so as ready for discharge Improvement in symptoms so as ready for discharge   Short Term Goals: Ability to identify changes in lifestyle to reduce recurrence of condition will improve Ability to verbalize feelings will improve Ability to disclose and discuss suicidal ideas Ability to demonstrate self-control will improve Ability to identify and develop effective coping behaviors will improve Ability to maintain clinical measurements within normal limits will improve Compliance with prescribed medications will improve Ability to identify triggers associated with substance abuse/mental health issues will improve Ability to identify changes in lifestyle to reduce recurrence of condition will improve Ability to verbalize feelings will improve Ability to disclose and discuss suicidal ideas Ability to demonstrate self-control will  improve Ability to identify and develop effective coping behaviors will improve Ability to maintain clinical measurements within normal limits will improve Compliance with prescribed medications will improve Ability to identify triggers associated with substance abuse/mental health issues will improve     Medication Management: Evaluate patient's response, side effects, and tolerance of medication regimen.  Therapeutic Interventions: 1 to 1 sessions, Unit Group sessions and Medication administration.  Evaluation of Outcomes: Progressing   RN Treatment Plan for Primary Diagnosis: MDD (major depressive disorder), recurrent severe, without psychosis (HCC) Long Term Goal(s): Knowledge of disease and therapeutic regimen to maintain health will improve  Short Term Goals: Ability to remain free from injury will improve, Ability to disclose and discuss suicidal ideas and Ability to identify and develop effective coping behaviors will improve  Medication Management: RN will administer medications as ordered by provider, will assess and evaluate patient's response and provide education to patient for prescribed medication. RN will report any adverse and/or side effects to prescribing provider.  Therapeutic Interventions: 1 on 1 counseling sessions, Psychoeducation, Medication administration, Evaluate responses to treatment, Monitor vital signs and CBGs as ordered, Perform/monitor CIWA, COWS, AIMS and Fall Risk screenings as ordered, Perform wound care treatments as ordered.  Evaluation of Outcomes: Progressing   LCSW Treatment Plan for Primary Diagnosis: MDD (major depressive disorder), recurrent severe, without psychosis (HCC) Long Term Goal(s): Safe transition to appropriate next level of care at discharge, Engage patient in therapeutic group addressing interpersonal concerns.  Short Term Goals: Engage patient in aftercare planning with  referrals and resources, Facilitate patient progression  through stages of change regarding substance use diagnoses and concerns and Identify triggers associated with mental health/substance abuse issues  Therapeutic Interventions: Assess for all discharge needs, 1 to 1 time with Social worker, Explore available resources and support systems, Assess for adequacy in community support network, Educate family and significant other(s) on suicide prevention, Complete Psychosocial Assessment, Interpersonal group therapy.  Evaluation of Outcomes: Progressing   Progress in Treatment: Attending groups: Yes. Participating in groups: Yes. Taking medication as prescribed: Yes. Toleration medication: Yes. Family/Significant other contact made: SPE completed with pt; pt declined to consent to family contact.  Patient understands diagnosis: Yes. Discussing patient identified problems/goals with staff: Yes. Medical problems stabilized or resolved: Yes. Denies suicidal/homicidal ideation: Yes. Issues/concerns per patient self-inventory: No. Other: n/a  New problem(s) identified: No, Describe:  n/a  New Short Term/Long Term Goal(s): medication management; detox; development of comprehensive mental wellness/sobriety plan.   Discharge Plan or Barriers: CSW assessing. Pt plans to go to [facility] at discharge.   Reason for Continuation of Hospitalization: Depression Medication stabilization Withdrawal symptoms  Estimated Length of Stay: 2-4 days   Attendees: Patient: 03/29/2016 9:21 AM  Physician: Larene Beach MD 03/29/2016 9:21 AM  Nursing: Shella Maxim RN 03/29/2016 9:21 AM  RN Care Manager: Lars Pinks CM 03/29/2016 9:21 AM  Social Worker: Nira Conn Smart, LCSW; Adriana Reams LCSW 03/29/2016 9:21 AM  Recreational Therapist:  03/29/2016 9:21 AM  Other: Samuel Jester NP; Ricky Ala NP 03/29/2016 9:21 AM  Other:  03/29/2016 9:21 AM  Other: 03/29/2016 9:21 AM    Scribe for Treatment Team: [place], LCSW 03/29/2016 9:21 AM

## 2016-03-30 LAB — GLUCOSE, CAPILLARY
GLUCOSE-CAPILLARY: 437 mg/dL — AB (ref 65–99)
GLUCOSE-CAPILLARY: 297 mg/dL — AB (ref 65–99)
GLUCOSE-CAPILLARY: 563 mg/dL — AB (ref 65–99)
GLUCOSE-CAPILLARY: 396 mg/dL — AB (ref 65–99)
GLUCOSE-CAPILLARY: 348 mg/dL — AB (ref 65–99)
GLUCOSE-CAPILLARY: 272 mg/dL — AB (ref 65–99)
GLUCOSE-CAPILLARY: 291 mg/dL — AB (ref 65–99)
Glucose-Capillary: 562 mg/dL (ref 65–99)
Glucose-Capillary: 515 mg/dL (ref 65–99)

## 2016-03-30 MED ORDER — INSULIN GLARGINE 100 UNIT/ML ~~LOC~~ SOLN
15.0000 [IU] | Freq: Two times a day (BID) | SUBCUTANEOUS | Status: DC
  Administered 2016-03-30: 15 [IU] via SUBCUTANEOUS

## 2016-03-30 MED ORDER — METFORMIN HCL 500 MG PO TABS
1000.0000 mg | ORAL_TABLET | Freq: Two times a day (BID) | ORAL | Status: DC
  Filled 2016-03-30 (×11): qty 2

## 2016-03-30 MED ORDER — INSULIN GLARGINE 100 UNIT/ML ~~LOC~~ SOLN: 15.0000 [IU] | Freq: Two times a day (BID) | SUBCUTANEOUS | Status: DC

## 2016-03-30 MED ORDER — INSULIN GLARGINE 100 UNIT/ML ~~LOC~~ SOLN
30.0000 [IU] | Freq: Every evening | SUBCUTANEOUS | Status: DC
  Administered 2016-03-31: 30 [IU] via SUBCUTANEOUS

## 2016-03-30 MED ORDER — POTASSIUM CHLORIDE CRYS ER 20 MEQ PO TBCR
40.0000 meq | EXTENDED_RELEASE_TABLET | Freq: Every day | ORAL | Status: AC
  Administered 2016-03-30 – 2016-03-31 (×2): 40 meq via ORAL
  Filled 2016-03-30 (×3): qty 2

## 2016-03-30 MED ORDER — INSULIN ASPART 100 UNIT/ML ~~LOC~~ SOLN
10.0000 [IU] | Freq: Once | SUBCUTANEOUS | Status: AC
  Administered 2016-03-30: 10 [IU] via SUBCUTANEOUS

## 2016-03-30 MED ORDER — INSULIN ASPART PROT & ASPART (70-30 MIX) 100 UNIT/ML ~~LOC~~ SUSP: 8.0000 [IU] | Freq: Two times a day (BID) | SUBCUTANEOUS | Status: DC

## 2016-03-30 MED ORDER — INSULIN ASPART 100 UNIT/ML ~~LOC~~ SOLN
0.0000 [IU] | Freq: Three times a day (TID) | SUBCUTANEOUS | Status: DC
  Administered 2016-03-31 (×2): 20 [IU] via SUBCUTANEOUS
  Administered 2016-03-31: 7 [IU] via SUBCUTANEOUS

## 2016-03-30 NOTE — Progress Notes (Signed)
Patient ID: Kenneth [name], male   DOB: 06/17/1965, 51 y.o.   MRN: [id number]  Pt CBG - 437. In no current distress. Notified Donell SievertSpencer Simon, PA. Verbal order to administer 20 U of Novolog. See MAR. Pt tolerated well. Asks Clinical research associatewriter "when can I go eat breakfast?"

## 2016-03-30 NOTE — [facility] Group Notes (Signed)
[facility] Group Notes:  (Nursing/MHT/Case Management/Adjunct)  Date:  03/30/2016  Time:  9:08 AM  Type of Therapy:  Psychoeducational Skills  Participation Level:  Did Not Attend  Patient invited; declined to attend.  Cranford MonBeaudry, Kaysin Brock Evans 03/30/2016, 9:08 AM

## 2016-03-30 NOTE — Progress Notes (Signed)
Psychoeducational Group Note  Date:  03/30/2016 Time:  2312  Group Topic/Focus:  Wrap-Up Group:   The focus of this group is to help patients review their daily goal of treatment and discuss progress on daily workbooks.   Participation Level: Did Not Attend  Participation Quality:  Not Applicable  Affect:  Not Applicable  Cognitive:  Not Applicable  Insight:  Not Applicable  Engagement in Group: Not Applicable  Additional Comments:  The patient did not attend this evening's A.A. Meeting since he refused to attend. Patient stated that he had already attended three groups.    Thetis Schwimmer S 03/30/2016, 11:12 PM

## 2016-03-30 NOTE — Progress Notes (Signed)
CBG rechecked and patient's value is 515.  Notified Maura Crandallonrad W., NP of value and was given order for CBGs to be taken hourly until value is under 400.  Order placed and appropriate staff notified.

## 2016-03-30 NOTE — Progress Notes (Signed)
Patient's CBG was 562 at 1650.  Notified provider Maura Crandallonrad W., NP of critical value.  Gave verbal order of 70/30 8 units plus sliding scale.  Sliding scale calls for 20 U of novolog which was given.  Pharmacy called to verify 70/30 order stating it was duplicate therapy.  Transferred call to provider to talk with pharmacist regarding order.  After phone call, was given verbal order to increase Lantus 15 units to BID starting at 2000.  Patient will receive Lantus 15 Units at 0800 and 2000.  Rechecked CBG and reading was 563.  Will recheck in one hour.

## 2016-03-30 NOTE — Progress Notes (Signed)
Recreation Therapy Notes  Animal-Assisted Activity (AAA) Program Checklist/Progress Notes Patient Eligibility Criteria Checklist & Daily Group note for Rec TxIntervention  Date: 01.09.2018 Time: 2:45pm [address]: [address]    AAA/T Program Assumption of Risk Form signed by Patient/ or Parent Legal Guardian Yes  Patient is free of allergies or sever asthma Yes  Patient reports no fear of animals Yes  Patient reports no history of cruelty to animals Yes  Patient understands his/her participation is voluntary Yes  Patient washes hands before animal contact Yes  Patient washes hands after animal contact Yes  Behavioral Response: Engaged, Appropriate   Education:Hand Washing, Appropriate Animal Interaction   Education Outcome: Acknowledges education.   Clinical Observations/Feedback: Patient attended session and interacted appropriately with therapy dog and peers.   Marykay Lexenise L Zanovia Rotz, LRT/CTRS          Nolah Krenzer L 03/30/2016 3:04 PM

## 2016-03-30 NOTE — [facility] Group Notes (Signed)
[facility] LCSW Group Therapy  03/30/2016 1:13 PM  Type of Therapy:  Group Therapy  Participation Level:  Did Not Attend-pt invited. Chose to remain in bed.   Summary of Progress/Problems: MHA Speaker came to talk about his personal journey with substance abuse and addiction. The pt processed ways by which to relate to the speaker. MHA speaker provided handouts and educational information pertaining to groups and services [facility] by [facility].   Pietra Zuluaga N Smart LCSW 03/30/2016, 1:13 PM

## 2016-03-30 NOTE — Progress Notes (Signed)
[facility]  03/30/2016 3:58 PM Kenneth RainwaterRandy C [name]  MRN:  [id number]  Subjective: Patient reports " I am in pain.  Worried if I can get disability.  I keep getting turned down." Objective: Kenneth [name] is awake, alert and oriented *3. seen attending group session for grief.  Denies suicidal or homicidal ideation. Denies auditory or visual hallucination and does not appear to be responding to internal stimuli. Patient seen using a walker for safety. Patient with a flat affect. Patient reports he is medication compliant without medication side effects.  Support, encouragement and reassurance was provided.   Principal Problem: MDD (major depressive disorder), recurrent severe, without psychosis (HCC)  Diagnosis:   Patient Active Problem List   Diagnosis Date Noted  . MDD (major depressive disorder), recurrent severe, without psychosis (HCC) [F33.2] 03/27/2016  . Recurrent major depressive disorder (HCC) [F33.9]   . Abdominal pain [R10.9]   . Hyperglycemia [R73.9] 02/24/2016  . Volume depletion [E86.9] 12/21/2015  . Metabolic acidosis [E87.2] 12/21/2015  . Diabetes mellitus with nonketotic hyperosmolarity (HCC) [E11.00] 09/21/2014  . Noncompliance with medication regimen [Z91.14]   . Diabetes mellitus (HCC) [E11.9]   . DKA (diabetic ketoacidoses) (HCC) [E13.10] 07/24/2014  . Dehydration [E86.0] 07/24/2014  . Acute renal failure (HCC) [N17.9] 07/24/2014  . Hyperkalemia [E87.5] 07/24/2014  . Hyponatremia [E87.1] 07/24/2014  . Hyperthyroidism [E05.90] 07/24/2014   Total Time spent with patient: 25 minutes  Past Psychiatric History: Major depression.  Past Medical History:  Past Medical History:  Diagnosis Date  . Arthritis   . Diabetes mellitus   . DKA (diabetic ketoacidosis) (HCC) 07/2014  . Malnutrition (HCC)   . Noncompliance with medication regimen   . Thyroid disease    History reviewed. No pertinent surgical history.  Family History:  Family History  Problem  Relation Age of Onset  . Diabetes Mother   . Seizures Sister   . Diabetes Sister    Family Psychiatric  History: See H&P  Social History:  History  Alcohol Use No     History  Drug Use No    Social History   Social History  . Marital status: Divorced    Spouse name: N/A  . Number of children: N/A  . Years of education: N/A   Social History Main Topics  . Smoking status: Current Every Day Smoker    Packs/day: 0.50    Years: 8.00  . Smokeless tobacco: Never Used  . Alcohol use No  . Drug use: No  . Sexual activity: Not Asked   Other Topics Concern  . None   Social History Narrative  . None   Additional Social History:   Sleep: Fair  Appetite:  Fair  Current Medications: Current Facility-Administered Medications  Medication Dose Route Frequency Provider Last Rate Last Dose  . acetaminophen (TYLENOL) tablet 650 mg  650 mg Oral Q6H PRN Truman Haywardakia S Starkes, FNP   650 mg at 03/27/16 [id number]  . alum & mag hydroxide-simeth (MAALOX/MYLANTA) 200-200-20 MG/5ML suspension 30 mL  30 mL Oral Q4H PRN Truman Haywardakia S Starkes, FNP      . doxepin (SINEQUAN) capsule 25 mg  25 mg Oral QHS,MR X 1 Beau FannyJohn C Withrow, FNP   25 mg at 03/29/16 2113  . FLUoxetine (PROZAC) capsule 20 mg  20 mg Oral Daily Truman Haywardakia S Starkes, FNP   20 mg at 03/30/16 0813  . gabapentin (NEURONTIN) capsule 400 mg  400 mg Oral TID Thresa RossNadeem Akhtar, MD   400 mg at 03/30/16 1204  .  hydrOXYzine (ATARAX/VISTARIL) tablet 25 mg  25 mg Oral TID PRN Truman Hayward, FNP   25 mg at 03/29/16 2119  . insulin aspart (novoLOG) injection 0-20 Units  0-20 Units Subcutaneous TID WC Beau Fanny, FNP   11 Units at 03/30/16 1203  . insulin glargine (LANTUS) injection 15 Units  15 Units Subcutaneous QHS Oneta Rack, NP   15 Units at 03/29/16 2117  . magnesium hydroxide (MILK OF MAGNESIA) suspension 30 mL  30 mL Oral Daily PRN Truman Hayward, FNP      . meloxicam (MOBIC) tablet 15 mg  15 mg Oral Daily Beau Fanny, FNP   15 mg at 03/30/16 0813  .  potassium chloride SA (K-DUR,KLOR-CON) CR tablet 40 mEq  40 mEq Oral Daily Adonis Brook, NP   40 mEq at 03/30/16 1203   Lab Results:  Results for orders placed or performed during the hospital encounter of 03/26/16 (from the past 48 hour(s))  Glucose, capillary     Status: Abnormal   Collection Time: 03/28/16  5:06 PM  Result Value Ref Range   Glucose-Capillary 385 (H) 65 - 99 mg/dL   Comment 1 Notify RN    Comment 2 Document in Chart   Glucose, capillary     Status: Abnormal   Collection Time: 03/28/16  8:48 PM  Result Value Ref Range   Glucose-Capillary 338 (H) 65 - 99 mg/dL   Comment 1 Notify RN   Glucose, capillary     Status: Abnormal   Collection Time: 03/29/16  5:46 AM  Result Value Ref Range   Glucose-Capillary 498 (H) 65 - 99 mg/dL   Comment 1 Notify RN   Glucose, capillary     Status: Abnormal   Collection Time: 03/29/16 12:08 PM  Result Value Ref Range   Glucose-Capillary 296 (H) 65 - 99 mg/dL  Glucose, capillary     Status: Abnormal   Collection Time: 03/29/16  5:05 PM  Result Value Ref Range   Glucose-Capillary 406 (H) 65 - 99 mg/dL  Glucose, capillary     Status: Abnormal   Collection Time: 03/29/16  8:43 PM  Result Value Ref Range   Glucose-Capillary 248 (H) 65 - 99 mg/dL   Comment 1 Notify RN   Glucose, capillary     Status: Abnormal   Collection Time: 03/30/16  5:56 AM  Result Value Ref Range   Glucose-Capillary 437 (H) 65 - 99 mg/dL  Glucose, capillary     Status: Abnormal   Collection Time: 03/30/16 12:00 PM  Result Value Ref Range   Glucose-Capillary 297 (H) 65 - 99 mg/dL   Blood Alcohol level:  Lab Results  Component Value Date   ETH <5 03/25/2016   ETH <5 09/21/2014   Metabolic Disorder Labs: Lab Results  Component Value Date   HGBA1C 13.2 (H) 02/23/2016   MPG 332 02/23/2016   MPG 286 07/24/2014   Lab Results  Component Value Date   PROLACTIN 7.8 03/27/2016   No results found for: CHOL, TRIG, HDL, CHOLHDL, VLDL, LDLCALC  Physical  Findings: AIMS: Facial and Oral Movements Muscles of Facial Expression: None, normal Lips and Perioral Area: None, normal Jaw: None, normal Tongue: None, normal,Extremity Movements Upper (arms, wrists, hands, fingers): None, normal Lower (legs, knees, ankles, toes): None, normal, Trunk Movements Neck, shoulders, hips: None, normal, Overall Severity Severity of abnormal movements (highest score from questions above): None, normal Incapacitation due to abnormal movements: None, normal Patient's awareness of abnormal movements (rate only patient's report):  No Awareness, Dental Status Current problems with teeth and/or dentures?: Yes Does patient usually wear dentures?: No  CIWA:    COWS:     Musculoskeletal: Strength & Muscle Tone: within normal limits Gait & Station: normal- walker for ambulation assistance Patient leans: N/A  Psychiatric Specialty Exam: Physical Exam  Nursing note and vitals reviewed. Constitutional: He is oriented to person, place, and time. He appears well-developed.  HENT:  Head: Normocephalic.  Neurological: He is alert and oriented to person, place, and time.  Psychiatric: He has a normal mood and affect. His behavior is normal.    Review of Systems  Psychiatric/Behavioral: Positive for depression. The patient is nervous/anxious and has insomnia.     Blood pressure 104/67, pulse 80, temperature 98.4 F (36.9 C), temperature source Oral, resp. rate 16, height 5\' 9"  (1.753 m), weight 59 kg (130 lb), SpO2 99 %.Body mass index is 19.2 kg/m.  General Appearance:Fairly Groomed  Eye Contact:  Good  Speech:  Clear and Coherent and Normal Rate  Volume:  Normal  Mood:  Anxious  Affect:  Appropriate and Congruent  Thought Process:  Coherent, Goal Directed, Linear and Descriptions of Associations: Intact  Orientation:  Full (Time, Place, and Person)  Thought Content:  Focused on back pain, depression, anxiety  Suicidal Thoughts:  Yes.  without intent/plan   Homicidal Thoughts:  No  Memory:  Immediate;   Fair Recent;   Fair Remote;   Fair  Judgement:  Fair  Insight:  Fair  Psychomotor Activity:  Normal  Concentration:  Concentration: Fair and Attention Span: Fair  Recall:  [employer] of Knowledge:  Fair  Language:  Fair  Akathisia:  No  Handed:    AIMS (if indicated):     Assets:  Communication Skills Desire for Improvement Resilience Social Support  ADL's:  Intact  Cognition:  WNL  Sleep:  Number of Hours: 6.0      I agree with current treatment plan on 01/08//2018, Patient seen face-to-face for psychiatric evaluation follow-up, chart reviewed. Reviewed the information documented and agree with the treatment plan.  Treatment Plan Summary:  Daily contact with patient to assess and evaluate symptoms and progress in treatment and Medication management  For Depressed mood/anxiety: Will continue Prozac 20 mg po daily.  For agitation/diabetic neuropathy: Will continue the Neurontin 400 mg.  For Anxiety disorder: Will continue Doxepin 25  mg po Q hs prn. Will continue Hydroxyzine 25 mg tid prn  For insomnia: Will continue Doxepin  25 mg po qhs prn.  Other medical issues & concerns: Will continue all home medication regimen as recommended (see MAR)  - Continue 15 minutes observation for safety concerns - Encouraged to participate in milieu therapy and group therapy counseling sessions and also work with coping skills -  Develop treatment plan to reduce the need for readmission. -  Psycho-social education regarding self care. - Health care follow up as needed for medical problems. - Restart home medications where appropriate.  Lindwood Qua, NP [facility] 03/30/2016,   Agree with NP Progress Note

## 2016-03-30 NOTE — Progress Notes (Signed)
D: Patient states he doesn't feel well today.  He rates his pain as a 10/10 generalized "all over."  Patient has neuropathy and complains of pain bilateral arms.  Patient denies any withdrawal symptoms.  He remains passively suicidal with no specific plan; he contracts for safety on the unit.  Patient rates his depression as a 10; hopelessness as an 8; anxiety as an 8.  Diabetic consult ordered for patient due uncontrolled blood sugars.  His goal today is to "get healthy."  Patient also started on oral potassium due low result. A: Continue to monitor medication management and MD orders.  Safety checks completed every 15 minutes per protocol.  Offer support and encouragement as needed. R: Patient is receptive to staff.  He is isolative to his room today.  Patient has not been out for groups.

## 2016-03-30 NOTE — Progress Notes (Signed)
Patients 2000 CBG was 348. 15 u of lantus given as ordered. CBG rechecked and was 272. PA Spencer notified and new orders received. Urine cup given to patient for urinalysis. Specimen collected. Labs ordered for morning draw. Patient medicated with one time order of 10 units of novolog. New orders starting tomorrow acknowledged. CBG rechecked and was 291. Patient complained of feeling "starving". Chicken salad provided per provider verbal order. No dressing given.

## 2016-03-30 NOTE — Progress Notes (Signed)
Patient ID: Kenneth RainwaterRandy C [name], male   DOB: 04/28/1965, 51 y.o.   MRN: [id number]  Pt currently presents with an animated affect and anxious behavior. Pt reports to writer that their goal is to "get more food." Pt affect brighter, interacts positively with peers. Pt judgement about food choices and diabetes still poor. Pt reports good sleep with current medication regimen.   Pt provided with medications per providers orders. Pt's labs and vitals were monitored throughout the night. Pt supported emotionally and encouraged to express concerns and questions. Pt educated on medications and diabetic diet.  Pt's safety ensured with 15 minute and environmental checks. Pt currently denies SI/HI and A/V hallucinations. Pt verbally agrees to seek staff if SI/HI or A/VH occurs and to consult with staff before acting on any harmful thoughts. Will continue POC.

## 2016-03-31 ENCOUNTER — Encounter (HOSPITAL_COMMUNITY): Payer: Self-pay | Admitting: Family Medicine

## 2016-03-31 DIAGNOSIS — E1165 Type 2 diabetes mellitus with hyperglycemia: Secondary | ICD-10-CM | POA: Insufficient documentation

## 2016-03-31 DIAGNOSIS — E118 Type 2 diabetes mellitus with unspecified complications: Secondary | ICD-10-CM

## 2016-03-31 DIAGNOSIS — E1065 Type 1 diabetes mellitus with hyperglycemia: Secondary | ICD-10-CM

## 2016-03-31 DIAGNOSIS — IMO0002 Reserved for concepts with insufficient information to code with codable children: Secondary | ICD-10-CM | POA: Insufficient documentation

## 2016-03-31 LAB — URINALYSIS, ROUTINE W REFLEX MICROSCOPIC
Bacteria, UA: NONE SEEN
Bilirubin Urine: NEGATIVE
Glucose, UA: >=500 mg/dL — AB
Hgb urine dipstick: NEGATIVE
Ketones, ur: NEGATIVE mg/dL
Leukocytes, UA: NEGATIVE
Nitrite: NEGATIVE
Protein, ur: NEGATIVE mg/dL
RBC / HPF: NONE SEEN RBC/hpf (ref 0–5)
Specific Gravity, Urine: 1.023 (ref 1.005–1.030)
pH: 7 (ref 5.0–8.0)

## 2016-03-31 LAB — BASIC METABOLIC PANEL WITH GFR
Anion gap: 7 (ref 5–15)
BUN: 22 mg/dL — ABNORMAL HIGH (ref 6–20)
CO2: 27 mmol/L (ref 22–32)
Calcium: 8.9 mg/dL (ref 8.9–10.3)
Chloride: 98 mmol/L — ABNORMAL LOW (ref 101–111)
Creatinine, Ser: 0.88 mg/dL (ref 0.61–1.24)
GFR calc Af Amer: >60 mL/min
GFR calc non Af Amer: >60 mL/min
Glucose, Bld: 437 mg/dL — ABNORMAL HIGH (ref 65–99)
Potassium: 4.4 mmol/L (ref 3.5–5.1)
Sodium: 132 mmol/L — ABNORMAL LOW (ref 135–145)

## 2016-03-31 LAB — GLUCOSE, CAPILLARY
GLUCOSE-CAPILLARY: 370 mg/dL — AB (ref 65–99)
GLUCOSE-CAPILLARY: 323 mg/dL — AB (ref 65–99)
Glucose-Capillary: 221 mg/dL — ABNORMAL HIGH (ref 65–99)
Glucose-Capillary: 394 mg/dL — ABNORMAL HIGH (ref 65–99)
Glucose-Capillary: 476 mg/dL — ABNORMAL HIGH (ref 65–99)

## 2016-03-31 LAB — MAGNESIUM: Magnesium: 1.6 mg/dL — ABNORMAL LOW (ref 1.7–2.4)

## 2016-03-31 LAB — VITAMIN B12: Vitamin B-12: 304 pg/mL (ref 180–914)

## 2016-03-31 MED ORDER — DOXEPIN HCL 25 MG PO CAPS
25.0000 mg | ORAL_CAPSULE | Freq: Every evening | ORAL | Status: DC | PRN
  Administered 2016-03-31 – 2016-04-05 (×5): 25 mg via ORAL
  Filled 2016-03-31 (×2): qty 1
  Filled 2016-03-31: qty 7
  Filled 2016-03-31 (×3): qty 1

## 2016-03-31 MED ORDER — INSULIN ASPART 100 UNIT/ML ~~LOC~~ SOLN
0.0000 [IU] | Freq: Three times a day (TID) | SUBCUTANEOUS | Status: DC
  Administered 2016-04-01: 7 [IU] via SUBCUTANEOUS

## 2016-03-31 MED ORDER — INSULIN ASPART 100 UNIT/ML ~~LOC~~ SOLN
0.0000 [IU] | Freq: Every day | SUBCUTANEOUS | Status: DC
  Administered 2016-03-31: 4 [IU] via SUBCUTANEOUS

## 2016-03-31 MED ORDER — INSULIN ASPART 100 UNIT/ML ~~LOC~~ SOLN
3.0000 [IU] | Freq: Three times a day (TID) | SUBCUTANEOUS | Status: DC
  Administered 2016-04-01: 3 [IU] via SUBCUTANEOUS

## 2016-03-31 NOTE — Progress Notes (Signed)
Patient's CBG 476.  Notified hospitalist Dr. Karle PlumberGruntz.  Gave 20 U Novolog per sliding scale and new orders were placed.

## 2016-03-31 NOTE — Plan of Care (Signed)
Problem: Education: Goal: Verbalization of understanding the information provided will improve Outcome: Progressing Patient educated about diabetes management. Patient educated about proper snack choices and CBG levels. Patient educated about s/s of hypo/hyperglycemia. Patient verbalized understanding.

## 2016-03-31 NOTE — Consult Note (Signed)
Consult Note   JARAD BARTH ZOX:[id number] DOB: 02/09/1966 DOA: 03/26/2016  Referring MD/NP/PA: Neita Garnet, MD PCP: Dorena Dew, FNP   Chief Complaint: Hyperglycemia  HPI: Kenneth [name] is a 51 y.o. male with a history of IDDM, peripheral neuropathy, depression, and thyroid disorder who presented for major depressive disorder and was admitted for same 1/5. While undergoing treatment, blood sugars have been elevated and difficult to control. During this time he has experienced increased thirst, hunger, and urination as well as blurry vision. Despite escalating doses of insulin, sugars have stayed high and [facility] asked to consult for refractory hyperglycemia.   He says he was diagnosed with diabetes in 2009-2010, has been on metformin and other oral therapies in the past but could never consistently take this or insulin when prescribed due to financial constraints.Has been admitted multiple times for acute diabetic complications and reports history of seizure in the setting of hypoglycemia in the past.    Review of Systems: He has had some weight loss over the past few months-years. He also complains of pain "all over" including bilateral lower extremity stabbing pain that is chronic. Denies fever, chills, headache, cough, sore throat, chest pain, palpitations, shortness of breath, abdominal pain, nausea, vomiting, changes in bowel habits, blood in stool, arthralgias, and rash. All others reviewed and are negative.   Past Medical History:  Diagnosis Date  . Arthritis   . Diabetes mellitus   . DKA (diabetic ketoacidosis) ([place]) 07/2014  . Malnutrition ([place])   . Noncompliance with medication regimen   . Thyroid disease    History reviewed. No pertinent surgical history.  - Current smoker, not currently taking EtOH or illicit drugs.   No Known Allergies  Family History  Problem Relation Age of Onset  . Diabetes Mother   . Seizures Sister   . Diabetes Sister    - Family history  otherwise reviewed and not pertinent. Prior to Admission medications   Medication Sig Start Date End Date Taking? Authorizing Provider  acetaminophen-codeine (TYLENOL #3) 300-30 MG tablet Take 1 tablet by mouth every 6 (six) hours as needed for moderate pain. Patient not taking: Reported on 03/25/2016 03/25/16   Dorena Dew, FNP  Blood Glucose Monitoring Suppl (TRUE METRIX AIR GLUCOSE METER) w/Device KIT 1 each by Does not apply route 4 (four) times daily -  before meals and at bedtime. 03/25/16   Dorena Dew, FNP  FLUoxetine (PROZAC) 20 MG capsule Take 1 capsule (20 mg total) by mouth daily. 03/25/16   Dorena Dew, FNP  gabapentin (NEURONTIN) 300 MG capsule Take 1 capsule (300 mg total) by mouth 3 (three) times daily. 03/25/16   Dorena Dew, FNP  glucose blood (TRUE METRIX BLOOD GLUCOSE TEST) test strip Use as instructed 03/25/16   Dorena Dew, FNP  insulin aspart (NOVOLOG) 100 unit/mL injection Per sliding scale 03/25/16   Dorena Dew, FNP  insulin glargine (LANTUS) 100 UNIT/ML injection Inject 0.1 mLs (10 Units total) into the skin at bedtime. 03/25/16   Dorena Dew, FNP  Insulin Syringes, Disposable, U-100 0.5 ML MISC 1 each by Does not apply route 4 (four) times daily -  before meals and at bedtime. Patient not taking: Reported on 03/25/2016 03/25/16   Dorena Dew, FNP    Physical Exam: Vitals:   03/30/16 0700 03/30/16 0701 03/31/16 0700 03/31/16 0701  BP: 113/73 104/67 100/60 108/69  Pulse: 69 80 63 71  Resp: 16  18   Temp: 98.4 F (  36.9 C)  98 F (36.7 C)   TempSrc: Oral     SpO2:      Weight:      Height:       Constitutional: Underweight 51 y.o. male in no distress, calm demeanor Eyes: Lids and conjunctivae normal, PERRL ENMT: Mucous membranes are moist. Posterior pharynx clear of any exudate or lesions. Poor dentition.  Neck: normal, supple, no masses, no thyromegaly Respiratory: Non-labored breathing room air without accessory muscle use. Clear breath  sounds to auscultation bilaterally Cardiovascular: Regular rate and rhythm, no murmurs, rubs, or gallops. No carotid bruits. No JVD. No LE edema. 2+ pedal pulses. Abdomen: Normoactive bowel sounds. No tenderness, non-distended, and no masses palpated. No hepatosplenomegaly. GU: No indwelling catheter Musculoskeletal: No clubbing / cyanosis. No joint deformity upper and lower extremities. Good ROM, no contractures. Normal muscle tone.  Skin: Warm, dry. No rashes, wounds, or ulcers. No significant lesions noted.  Neurologic: CN II-XII grossly intact. Gait narrow-based. Impaired gross sensation to toes bilaterally. Speech normal. No focal deficits in motor strength in all extremities. DTRs 2+.  Psychiatric: Alert and oriented x3. Fair judgment and insight. Mood depressed with congruent affect.   Labs on Admission: I have personally reviewed following labs and imaging studies  CBC:  Recent Labs Lab 03/25/16 0943 03/25/16 1307  WBC 8.3 8.3  NEUTROABS 5,893  --   HGB 15.3 12.6*  HCT 45.8 38.0*  MCV 97.4 88.4  PLT 239 124   Basic Metabolic Panel:  Recent Labs Lab 03/25/16 0943 03/25/16 1307 03/31/16 0820  NA 135 137 132*  K 4.7 3.2* 4.4  CL 97* 104 98*  CO2 '20 25 27  '$ GLUCOSE 425* 80 437*  BUN 9 10 22*  CREATININE 0.84 0.64 0.88  CALCIUM 9.7 8.6* 8.9  MG  --   --  1.6*   GFR: Estimated Creatinine Clearance: 81.9 mL/min (by C-G formula based on SCr of 0.88 mg/dL). Liver Function Tests:  Recent Labs Lab 03/25/16 0943 03/25/16 1307  AST 14 16  ALT 15 17  ALKPHOS 79 59  BILITOT 0.4 0.3  PROT 7.1 6.2*  ALBUMIN 4.4 3.9   CBG:  Recent Labs Lab 03/30/16 2058 03/30/16 2227 03/31/16 0546 03/31/16 1044 03/31/16 1212  GLUCAP 272* 291* 221* 370* 394*   Assessment/Plan Principal Problem:   MDD (major depressive disorder), recurrent severe, without psychosis ([place]) Active Problems:   Diabetes mellitus ([place])   Kenneth [name] is a 51 y.o. male with brittle  insulin-dependent diabetes mellitus with uncontrolled hyperglycemia without DKA or HHNK in the setting of treatment for severe, recurrent major depressive disorder.  Insulin-dependent diabetes mellitus: HbA1c 13.2% in Dec 2017 indicating average blood glucose around '330mg'$ /dL, explaining polyuria, polydipsia, blurry vision, and BMI of 19. No evidence of infection on exam, afebrile, no leukocytosis, so suspect this is just continuation of outpatient hyperglycemia possibly worsened by stress. Data from previous [facility] admissions reviewed, most recently started on lantus 10u daily + SSI by first office visit with new PCP, on the day prior to [facility] admission. BMP checked today showed pseudohyponatremia without anion gap, acidosis or AKI. No indication for medical admission.  - Total short-acting insulin used yesterday was 55u, on same trajectory today, so will add 15 units to current lantus:  - Increase lantus to 30u qHS tonight  - Continue resistant SSI   - Add 3u TIDWC if eats >50% of meal  - Continue metformin  - Changed to carb-modified diet - Goal of therapy acutely is  to minimize osmotic diuresis/dehydration and avoid HHNK. Ideal target will eventually be below '180mg'$ /dL, but I fear he would have neuroglycopenic symptoms if CBG acutely corrected too much, especially as he's had seizures with hypoglycemia in the past.  - Of note, pt endorses financial and food insecurity. Would discharge on conservative regimen with outpatient follow up to avoid hypoglycemia.    Peripheral neuropathy: ?Due to poorly controlled DM  - Continue neurontin '400mg'$  TID - Rule out B12 deficiency with PM lab draw  MDD: Per primary  History of thyroid disease: TSH 0.569 during this admission.   Hypomagnesemia: Mild, without hypocalcemia or hypokalemia.  - Consider outpatient recheck    Vance Gather, MD [facility] Pager [phone]  If 7PM-7AM, please contact night-coverage [email or web address] Password [email or web address] 03/31/2016,  5:18 PM

## 2016-03-31 NOTE — Progress Notes (Signed)
Recreation Therapy Notes  Date: 03/31/16 Time: 0930 Location: 300 Hall Group Room  Group Topic: Stress Management  Goal Area(s) Addresses:  Patient will verbalize importance of using healthy stress management.  Patient will identify positive emotions associated with healthy stress management.   Intervention: Stress Management  Activity :  Letting Go Meditation.  LRT introduced the stress management technique of meditation.  LRT played a meditation from the Calm app so patients could engage in the activity.  Patients were to follow along with the meditation to participate.  Education:  Stress Management, Discharge Planning.   Education Outcome: Acknowledges edcuation/In group clarification offered/Needs additional education  Clinical Observations/Feedback: Pt did not attend group.   Jannine Abreu, LRT/CTRS         Jameriah Trotti A 03/31/2016 12:38 PM 

## 2016-03-31 NOTE — [facility] Group Notes (Signed)
[facility] LCSW Group Therapy  03/31/2016 3:14 PM  Type of Therapy:  Group Therapy  Participation Level:  Active  Participation Quality:  Attentive  Affect:  Appropriate  Cognitive:  Appropriate  Insight:  Improving  Engagement in Therapy:  Improving  Modes of Intervention:  Discussion, Education, Exploration, Problem-solving, Socialization and Support  Summary of Progress/Problems: Today's Topic: Overcoming Obstacles. Patients identified one short term goal and potential obstacles in reaching this goal. Patients processed barriers involved in overcoming these obstacles. Patients identified steps necessary for overcoming these obstacles and explored motivation (internal and external) for facing these difficulties head on.   Darcella Shiffman N Smart LCSW 03/31/2016, 3:14 PM  

## 2016-03-31 NOTE — Plan of Care (Signed)
Problem: Education: Goal: Emotional status will improve Outcome: Not Progressing Patient labile with irritable mood today.  Unwilling to receive education regarding good food choices to keep blood sugars down.

## 2016-03-31 NOTE — Progress Notes (Signed)
D: Patient's mood is irritable today.  Patient states that he didn't get enough breakfast this morning because "they were watching me."  Patient's CBGs have been elevated.  He came up to nurse's station and requested that his blood sugar be taken.  He states, "I feel so bad.  I know it's low.  I'm not getting enough to eat."  Patient refused ginger ale stating, "I don't like the taste of it."  Patient's CBG was 370.  Patient educated on importance of eating the right foods.  He has poor insight regarding his diabetes.  He refused metformin this morning stating, "it upsets my stomach. I never take that stuff."  Patient has hospitalist and diabetic consult pending.  Notified NP of CBG value.  Patient rates his depression as a 10; hopelessness as an 8.  He reports passive SI with no specific plan.  He denies HI/AVH. A: Continue to monitor medication management and MD orders.  Safety checks completed every 15 minutes per protocol.  Offer support and encouragement as needed. R: Patient needs redirection at times due to irritability and unwillingness to make better food choices during meals and snacks.

## 2016-03-31 NOTE — Progress Notes (Signed)
[facility] MD Progress Note  03/31/2016 3:55 PM JAQUAVIAN FIRKUS  MRN:  [id number]  Subjective: patient reports he feels his mood is improved, feels less depressed. Reports some ongoing anxiety, depression ( improved ) and today reports having generalized aching , but without other symptoms. Denies medication side effects. Objective:  I have discussed case with treatment team and have met with patient . Patient is a 51 year old man, presented to hospital due to worsening depression. Reports significant psychosocial stressors- lives alone, reports limited support network, states he cannot work due to his psychiatric and physical symptoms, no source of income. Patient reports history of DM, which has been poorly controlled, states " sometimes my level ( referring to blood glucose level) has been more than a thousand". He also reports limited ambulation and history of falls/ lower extremity peripheral neuropathy. He ambulates with walker. Patient reports improving mood , and states he is feeling better, less depressed, but continues to report significant anxiety, partly related to significant psychosocial stressors as reported above.  No disruptive or agitated behaviors on unit. As discussed with staff, he has reported ongoing depression, passive SI , but has contracted for safety on unit, and has focused on issues regarding Diabetes, food.    Principal Problem: MDD (major depressive disorder), recurrent severe, without psychosis ([place])  Diagnosis:   Patient Active Problem List   Diagnosis Date Noted  . MDD (major depressive disorder), recurrent severe, without psychosis ([place]) [F33.2] 03/27/2016  . Recurrent major depressive disorder ([place]) [F33.9]   . Abdominal pain [R10.9]   . Hyperglycemia [R73.9] 02/24/2016  . Volume depletion [E86.9] 12/21/2015  . Metabolic acidosis [N02.7] 12/21/2015  . Diabetes mellitus with nonketotic hyperosmolarity ([place]) [E11.00] 09/21/2014  . Noncompliance with medication  regimen [Z91.14]   . Diabetes mellitus ([place]) [E11.9]   . DKA (diabetic ketoacidoses) ([place]) [E13.10] 07/24/2014  . Dehydration [E86.0] 07/24/2014  . Acute renal failure ([place]) [N17.9] 07/24/2014  . Hyperkalemia [E87.5] 07/24/2014  . Hyponatremia [E87.1] 07/24/2014  . Hyperthyroidism [E05.90] 07/24/2014   Total Time spent with patient:  20 minutes   Past Psychiatric History: Major depression.  Past Medical History:  Past Medical History:  Diagnosis Date  . Arthritis   . Diabetes mellitus   . DKA (diabetic ketoacidosis) ([place]) 07/2014  . Malnutrition ([place])   . Noncompliance with medication regimen   . Thyroid disease    History reviewed. No pertinent surgical history.  Family History:  Family History  Problem Relation Age of Onset  . Diabetes Mother   . Seizures Sister   . Diabetes Sister    Family Psychiatric  History: See H&P  Social History:  History  Alcohol Use No     History  Drug Use No    Social History   Social History  . Marital status: Divorced    Spouse name: N/A  . Number of children: N/A  . Years of education: N/A   Social History Main Topics  . Smoking status: Current Every Day Smoker    Packs/day: 0.50    Years: 8.00  . Smokeless tobacco: Never Used  . Alcohol use No  . Drug use: No  . Sexual activity: Not Asked   Other Topics Concern  . None   Social History Narrative  . None   Additional Social History:   Sleep: improved   Appetite:  Reports polyphagia, polyuria  Current Medications: Current Facility-Administered Medications  Medication Dose Route Frequency Provider Last Rate Last Dose  . acetaminophen (TYLENOL) tablet 650  mg  650 mg Oral Q6H PRN [name], FNP   650 mg at 03/27/16 6294  . alum & mag hydroxide-simeth (MAALOX/MYLANTA) 200-200-20 MG/5ML suspension 30 mL  30 mL Oral Q4H PRN [name], FNP      . doxepin (SINEQUAN) capsule 25 mg  25 mg Oral QHS,MR X 1 [name], FNP   25 mg at 03/30/16 2114  .  FLUoxetine (PROZAC) capsule 20 mg  20 mg Oral Daily [name], FNP   20 mg at 03/31/16 7654  . gabapentin (NEURONTIN) capsule 400 mg  400 mg Oral TID [name], MD   400 mg at 03/31/16 0827  . hydrOXYzine (ATARAX/VISTARIL) tablet 25 mg  25 mg Oral TID PRN [name], FNP   25 mg at 03/29/16 2119  . insulin aspart (novoLOG) injection 0-20 Units  0-20 Units Subcutaneous TID WC & HS Laverle Hobby, PA-C   20 Units at 03/31/16 1216  . insulin glargine (LANTUS) injection 30 Units  30 Units Subcutaneous QPM Spencer E Simon, PA-C      . magnesium hydroxide (MILK OF MAGNESIA) suspension 30 mL  30 mL Oral Daily PRN [name], FNP      . meloxicam (MOBIC) tablet 15 mg  15 mg Oral Daily [name], FNP   15 mg at 03/31/16 6503  . metFORMIN (GLUCOPHAGE) tablet 1,000 mg  1,000 mg Oral BID WC Laverle Hobby, PA-C       Lab Results:  Results for orders placed or performed during the hospital encounter of 03/26/16 (from the past 48 hour(s))  Glucose, capillary     Status: Abnormal   Collection Time: 03/29/16  5:05 PM  Result Value Ref Range   Glucose-Capillary 406 (H) 65 - 99 mg/dL  Glucose, capillary     Status: Abnormal   Collection Time: 03/29/16  8:43 PM  Result Value Ref Range   Glucose-Capillary 248 (H) 65 - 99 mg/dL   Comment 1 Notify RN   Glucose, capillary     Status: Abnormal   Collection Time: 03/30/16  5:56 AM  Result Value Ref Range   Glucose-Capillary 437 (H) 65 - 99 mg/dL  Glucose, capillary     Status: Abnormal   Collection Time: 03/30/16 12:00 PM  Result Value Ref Range   Glucose-Capillary 297 (H) 65 - 99 mg/dL  Glucose, capillary     Status: Abnormal   Collection Time: 03/30/16  4:51 PM  Result Value Ref Range   Glucose-Capillary 562 (HH) 65 - 99 mg/dL   Comment 1 Repeat Test   Glucose, capillary     Status: Abnormal   Collection Time: 03/30/16  5:09 PM  Result Value Ref Range   Glucose-Capillary 563 (HH) 65 - 99 mg/dL   Comment 1 Notify RN    Glucose, capillary     Status: Abnormal   Collection Time: 03/30/16  6:09 PM  Result Value Ref Range   Glucose-Capillary 515 (HH) 65 - 99 mg/dL  Glucose, capillary     Status: Abnormal   Collection Time: 03/30/16  7:02 PM  Result Value Ref Range   Glucose-Capillary 396 (H) 65 - 99 mg/dL  Glucose, capillary     Status: Abnormal   Collection Time: 03/30/16  7:54 PM  Result Value Ref Range   Glucose-Capillary 348 (H) 65 - 99 mg/dL   Comment 1 Notify RN   Glucose, capillary     Status: Abnormal   Collection Time: 03/30/16  8:58  PM  Result Value Ref Range   Glucose-Capillary 272 (H) 65 - 99 mg/dL  Glucose, capillary     Status: Abnormal   Collection Time: 03/30/16 10:27 PM  Result Value Ref Range   Glucose-Capillary 291 (H) 65 - 99 mg/dL  Glucose, capillary     Status: Abnormal   Collection Time: 03/31/16  5:46 AM  Result Value Ref Range   Glucose-Capillary 221 (H) 65 - 99 mg/dL   Comment 1 Notify RN   Basic metabolic panel     Status: Abnormal   Collection Time: 03/31/16  8:20 AM  Result Value Ref Range   Sodium 132 (L) 135 - 145 mmol/L   Potassium 4.4 3.5 - 5.1 mmol/L   Chloride 98 (L) 101 - 111 mmol/L   CO2 27 22 - 32 mmol/L   Glucose, Bld 437 (H) 65 - 99 mg/dL   BUN 22 (H) 6 - 20 mg/dL   Creatinine, Ser 0.88 0.61 - 1.24 mg/dL   Calcium 8.9 8.9 - 10.3 mg/dL   GFR calc non Af Amer >60 >60 mL/min   GFR calc Af Amer >60 >60 mL/min    Comment: (NOTE) The eGFR has been calculated using the CKD EPI equation. This calculation has not been validated in all clinical situations. eGFR's persistently <60 mL/min signify possible Chronic Kidney Disease.    Anion gap 7 5 - 15    Comment: Performed at [facility]  Magnesium     Status: Abnormal   Collection Time: 03/31/16  8:20 AM  Result Value Ref Range   Magnesium 1.6 (L) 1.7 - 2.4 mg/dL    Comment: Performed at [facility]  Glucose, capillary     Status: Abnormal   Collection Time:  03/31/16 10:44 AM  Result Value Ref Range   Glucose-Capillary 370 (H) 65 - 99 mg/dL   Comment 1 Notify RN   Glucose, capillary     Status: Abnormal   Collection Time: 03/31/16 12:12 PM  Result Value Ref Range   Glucose-Capillary 394 (H) 65 - 99 mg/dL   Blood Alcohol level:  Lab Results  Component Value Date   ETH <5 03/25/2016   ETH <5 82/42/3536   Metabolic Disorder Labs: Lab Results  Component Value Date   HGBA1C 13.2 (H) 02/23/2016   MPG 332 02/23/2016   MPG 286 07/24/2014   Lab Results  Component Value Date   PROLACTIN 7.8 03/27/2016   No results found for: CHOL, TRIG, HDL, CHOLHDL, VLDL, LDLCALC  Physical Findings: AIMS: Facial and Oral Movements Muscles of Facial Expression: None, normal Lips and Perioral Area: None, normal Jaw: None, normal Tongue: None, normal,Extremity Movements Upper (arms, wrists, hands, fingers): None, normal Lower (legs, knees, ankles, toes): None, normal, Trunk Movements Neck, shoulders, hips: None, normal, Overall Severity Severity of abnormal movements (highest score from questions above): None, normal Incapacitation due to abnormal movements: None, normal Patient's awareness of abnormal movements (rate only patient's report): No Awareness, Dental Status Current problems with teeth and/or dentures?: Yes Does patient usually wear dentures?: No  CIWA:    COWS:     Musculoskeletal: Strength & Muscle Tone: within normal limits Gait & Station: normal- walker for ambulation assistance Patient leans: N/A  Psychiatric Specialty Exam: Physical Exam  Nursing note and vitals reviewed. Constitutional: He is oriented to person, place, and time. He appears well-developed.  HENT:  Head: Normocephalic.  Neurological: He is alert and oriented to person, place, and time.  Psychiatric: He has a normal  mood and affect. His behavior is normal.    Review of Systems  Psychiatric/Behavioral: Positive for depression. The patient is nervous/anxious  and has insomnia.     Blood pressure 108/69, pulse 71, temperature 98 F (36.7 C), resp. rate 18, height 5' 9" (1.753 m), weight 59 kg (130 lb), SpO2 99 %.Body mass index is 19.2 kg/m.  General Appearance:Fairly Groomed  Eye Contact:  fair   Speech:  normal   Volume:  Normal  Mood:  depressed, but states mood is improved ,  anxious  Affect:  congruent, anxious and vaguely irritable  Thought Process:  linear  Orientation:  Full (Time, Place, and Person)  Thought Content:  denies hallucinations, no delusions, not internally preoccupied   Suicidal Thoughts:  Denies current suicidal plan or intention, contracts for safety on the unit, has reported some passive SI at times  Homicidal Thoughts:  No, denies any homicidal or violent ideations  Memory:  recent and remote grossly intact   Judgement:  Fair  Insight:  Fair  Psychomotor Activity:  decreased   Concentration:  Concentration: Fair and Attention Span: Fair  Recall:  [employer] of Knowledge:  Fair  Language:  Fair  Akathisia:  No  Handed:    AIMS (if indicated):     Assets:  Communication Skills Desire for Improvement Resilience Social Support  ADL's:  Intact  Cognition:  WNL  Sleep:  Number of Hours: 6.0     Assessment - patient reports partial improvement in mood, but continues to present depressed, with a restricted and vaguely irritable affect, and has endorsed intermittent passive SI, although denies any plan or intention of suicide or of hurting self . Ruminates about psychosocial stressors, and presents with poorly controlled DM. Tolerating medications well ( Prozac, Neurontin)   Treatment Plan Summary:  Daily contact with patient to assess and evaluate symptoms and progress in treatment and Medication management Encourage group and milieu participation to work on disposition planning options  For Depressed mood/anxiety: Will continue Prozac 20 mg QDAY   For Anxiety/Peripheral Neuropathy: Will continue  Neurontin  400 mg TID  For Anxiety disorder: Will continue Hydroxyzine 25 mg Q 8 hours PRN   For insomnia: Will continue Doxepin  25 mg po QHS PRN  For DM management:  Will continue Insulin, GLucophage Hospitalist Consultation has been requested for assistance with DM management    Neita Garnet, MD  03/31/2016,   Patient ID: Morton Amy, male   DOB: 12-14-1965, 51 y.o.   MRN: [id number]

## 2016-03-31 NOTE — Progress Notes (Signed)
Nursing Progress Note 7p-7a  D) Patient presents labile but cooperative. Patient is often frustration with his high blood sugar and can be irritable at times. Patient complains of feeling starving and states "my blood sugar will do what it wants". Patient denies SI/HI/AVH or pain. Patient contracts for safety at this time.  A) Emotional support given. Patient medicated with PM orders as prescribed. Medications reviewed with patient. Food and fluids provided to patient. Patient on q15 min safety checks. Opportunities for questions or concerns presented to patient. Patient encouraged to continue to work on treatment goals.  R) Patient receptive to interaction with nurse. Patient remains safe on the unit at this time. Patient is resting in bed without complaints. Will continue to monitor.

## 2016-03-31 NOTE — Progress Notes (Signed)
Patient ID: Kenneth RainwaterRandy C [name], male   DOB: 09/13/1965, 51 y.o.   MRN: [id number]  Pt currently presents with a flat affect and anxious behavior. Pt preoccupied with his blood sugar dropping. Non adherent with carb modified diet, splits staff in order to get more snacks.  Pt states "my leg is still hurting. I don't know why they are giving me so much insulin." Pt reports good sleep with current medication regimen.   Pt provided with medications per providers orders. Pt's labs and vitals were monitored throughout the night. Pt supported emotionally and encouraged to express concerns and questions. Pt educated on medications and alternative relaxation techniques. Writer walked through guided meditation with patient, tolerated well.   Pt's safety ensured with 15 minute and environmental checks. Pt endorses passive SI. Pt currently denies HI and A/V hallucinations. Pt verbally agrees to seek staff if HI or A/VH occurs and to consult with staff before acting on any harmful thoughts. Will continue POC.

## 2016-04-01 LAB — GLUCOSE, CAPILLARY
GLUCOSE-CAPILLARY: 220 mg/dL — AB (ref 65–99)
GLUCOSE-CAPILLARY: 401 mg/dL — AB (ref 65–99)
Glucose-Capillary: 236 mg/dL — ABNORMAL HIGH (ref 65–99)
Glucose-Capillary: 140 mg/dL — ABNORMAL HIGH (ref 65–99)

## 2016-04-01 LAB — HEMOGLOBIN A1C
HEMOGLOBIN A1C: 12.2 % — AB (ref 4.8–5.6)
MEAN PLASMA GLUCOSE: 303 mg/dL

## 2016-04-01 MED ORDER — INSULIN GLARGINE 100 UNIT/ML ~~LOC~~ SOLN
45.0000 [IU] | Freq: Every evening | SUBCUTANEOUS | Status: DC
  Administered 2016-04-01: 45 [IU] via SUBCUTANEOUS

## 2016-04-01 MED ORDER — INSULIN ASPART 100 UNIT/ML ~~LOC~~ SOLN
0.0000 [IU] | Freq: Three times a day (TID) | SUBCUTANEOUS | Status: DC
Start: 2016-04-01 — End: 2016-04-02
  Administered 2016-04-01: 5 [IU] via SUBCUTANEOUS
  Administered 2016-04-01: 15 [IU] via SUBCUTANEOUS
  Administered 2016-04-02: 8 [IU] via SUBCUTANEOUS

## 2016-04-01 MED ORDER — INSULIN ASPART 100 UNIT/ML ~~LOC~~ SOLN
10.0000 [IU] | Freq: Three times a day (TID) | SUBCUTANEOUS | Status: DC
  Administered 2016-04-01 – 2016-04-02 (×3): 10 [IU] via SUBCUTANEOUS

## 2016-04-01 MED ORDER — INSULIN NPH (HUMAN) (ISOPHANE) 100 UNIT/ML ~~LOC~~ SUSP
15.0000 [IU] | Freq: Once | SUBCUTANEOUS | Status: AC
  Administered 2016-04-01: 15 [IU] via SUBCUTANEOUS

## 2016-04-01 NOTE — Progress Notes (Signed)
D: Patient complains of "not feeling well at all."  Patient has been in bed the majority of the day, only getting up for meals.  Patient is focused on his CBGs and feels like he is getting too much insulin.  Patient also complains of "feeling hungry all the time."  His CBGs have improved with the 1200 reading in the 200's.  Patient states, "I don't like people watching what I eat."  He continues to report depression with passive SI.  He contracts for safety on the unit.  Patient is less irritable today, however, appears more depressed.  Patient is not attending groups, electing to stay in bed. A: Continue to monitor medication and MD orders.  Safety checks completed every 15 minutes per protocol. Offer support and encouragement as needed. R: Patient is isolative and withdrawn.

## 2016-04-01 NOTE — [facility] Group Notes (Signed)
[facility] LCSW Group Therapy  04/01/2016 1:44 PM  Type of Therapy:  Group Therapy  Participation Level:  Did Not Attend-pt invited. Chose to rest in room.   Summary of Progress/Problems: Emotion Regulation: This group focused on both positive and negative emotion identification and allowed group members to process ways to identify feelings, regulate negative emotions, and find healthy ways to manage internal/external emotions. Group members were asked to reflect on a time when their reaction to an emotion led to a negative outcome and explored how alternative responses using emotion regulation would have benefited them. Group members were also asked to discuss a time when emotion regulation was utilized when a negative emotion was experienced.   Lorena Clearman N Smart LCSW 04/01/2016, 1:44 PM

## 2016-04-01 NOTE — Progress Notes (Addendum)
Vital signs stable.    CBG reviewed.  Yesterday, patient received a total daily insulin of 81 units.  Will increase by 25% due to ongoing hyperglycemia.  Will aim for CBGs around 160-180 for now due to concerns regarding hypoglycemia and hypoglycemic symptoms and have PCP gradually adjust insulin as outpatient.    Total daily insulin = 100 units AM CBG still elevated Increase lantus to 45 units One time dose of NPH this morning to supplement last nights lantus until this evening Increase meal time aspart to 10  Units Reduce SSI to moderate while titrating standing meal insulin  Will continue to review CBGs and follow, making adjustments as needed.

## 2016-04-01 NOTE — Progress Notes (Signed)
[facility] MD Progress Note  04/01/2016 3:16 PM YORDY MATTON  MRN:  [id number]  Subjective: I don't feel good. I'm hurting all over. I will get out of bed when I start to feel better. Right now, I'm not. My mood is not good. I'm staying away from people because I don't want to be irritated by them because it was not their fault".  Objective:  I have discussed case with treatment team and have met with patient . Patient is a 51 year old man, presented to hospital due to worsening depression. Reports significant psychosocial stressors- lives alone, reports limited support network, states he cannot work due to his psychiatric and physical symptoms, no source of income. Patient reports history of DM, which has been poorly controlled, states " sometimes my level ( referring to blood glucose level) has been more than a thousand". He also reports limited ambulation and history of falls/ lower extremity peripheral neuropathy. He ambulates with walker. Patient reports improving mood , and states he is still feeling depressed, continues to report significant anxiety, partly related to significant psychosocial stressors as reported above.  No disruptive or agitated behaviors on unit. As discussed with staff, he has reported ongoing depression, passive SI , but has contracted for safety on unit, and has focused on issues regarding Diabetes, food.   Principal Problem: MDD (major depressive disorder), recurrent severe, without psychosis ([place])  Diagnosis:   Patient Active Problem List   Diagnosis Date Noted  . Uncontrolled type 1 diabetes mellitus with hyperglycemia ([place]) [E10.65]   . MDD (major depressive disorder), recurrent severe, without psychosis ([place]) [F33.2] 03/27/2016  . Recurrent major depressive disorder ([place]) [F33.9]   . Abdominal pain [R10.9]   . Hyperglycemia [R73.9] 02/24/2016  . Volume depletion [E86.9] 12/21/2015  . Metabolic acidosis [B33.8] 12/21/2015  . Diabetes mellitus with nonketotic  hyperosmolarity ([place]) [E11.00] 09/21/2014  . Noncompliance with medication regimen [Z91.14]   . Diabetes mellitus ([place]) [E11.9]   . DKA (diabetic ketoacidoses) ([place]) [E13.10] 07/24/2014  . Dehydration [E86.0] 07/24/2014  . Acute renal failure ([place]) [N17.9] 07/24/2014  . Hyperkalemia [E87.5] 07/24/2014  . Hyponatremia [E87.1] 07/24/2014  . Hyperthyroidism [E05.90] 07/24/2014   Total Time spent with patient:  20 minutes   Past Psychiatric History: Major depression.  Past Medical History:  Past Medical History:  Diagnosis Date  . Arthritis   . Diabetes mellitus   . DKA (diabetic ketoacidosis) ([place]) 07/2014  . Malnutrition ([place])   . Noncompliance with medication regimen   . Thyroid disease    History reviewed. No pertinent surgical history.  Family History:  Family History  Problem Relation Age of Onset  . Diabetes Mother   . Seizures Sister   . Diabetes Sister    Family Psychiatric  History: See H&P  Social History:  History  Alcohol Use No     History  Drug Use No    Social History   Social History  . Marital status: Divorced    Spouse name: N/A  . Number of children: N/A  . Years of education: N/A   Social History Main Topics  . Smoking status: Current Every Day Smoker    Packs/day: 0.50    Years: 8.00  . Smokeless tobacco: Never Used  . Alcohol use No  . Drug use: No  . Sexual activity: Not Asked   Other Topics Concern  . None   Social History Narrative  . None   Additional Social History:   Sleep: improved   Appetite:  Reports polyphagia, polyuria  Current Medications: Current Facility-Administered Medications  Medication Dose Route Frequency Provider Last Rate Last Dose  . acetaminophen (TYLENOL) tablet 650 mg  650 mg Oral Q6H PRN [name], FNP   650 mg at 04/01/16 1402  . alum & mag hydroxide-simeth (MAALOX/MYLANTA) 200-200-20 MG/5ML suspension 30 mL  30 mL Oral Q4H PRN [name], FNP   30 mL at 03/31/16 2053  . doxepin  (SINEQUAN) capsule 25 mg  25 mg Oral QHS PRN Jenne Campus, MD   25 mg at 03/31/16 2228  . FLUoxetine (PROZAC) capsule 20 mg  20 mg Oral Daily [name], FNP   20 mg at 04/01/16 0819  . gabapentin (NEURONTIN) capsule 400 mg  400 mg Oral TID [name], MD   400 mg at 04/01/16 1213  . hydrOXYzine (ATARAX/VISTARIL) tablet 25 mg  25 mg Oral TID PRN [name], FNP   25 mg at 03/31/16 2123  . insulin aspart (novoLOG) injection 0-15 Units  0-15 Units Subcutaneous TID WC Janece Canterbury, MD   5 Units at 04/01/16 1213  . insulin aspart (novoLOG) injection 0-5 Units  0-5 Units Subcutaneous QHS Patrecia Pour, MD   4 Units at 03/31/16 2120  . insulin aspart (novoLOG) injection 10 Units  10 Units Subcutaneous TID WC Janece Canterbury, MD   10 Units at 04/01/16 1214  . insulin glargine (LANTUS) injection 45 Units  45 Units Subcutaneous QPM Janece Canterbury, MD      . magnesium hydroxide (MILK OF MAGNESIA) suspension 30 mL  30 mL Oral Daily PRN [name], FNP      . meloxicam (MOBIC) tablet 15 mg  15 mg Oral Daily [name], FNP   15 mg at 04/01/16 0819  . metFORMIN (GLUCOPHAGE) tablet 1,000 mg  1,000 mg Oral BID WC Laverle Hobby, PA-C       Lab Results:  Results for orders placed or performed during the hospital encounter of 03/26/16 (from the past 48 hour(s))  Glucose, capillary     Status: Abnormal   Collection Time: 03/30/16  4:51 PM  Result Value Ref Range   Glucose-Capillary 562 (HH) 65 - 99 mg/dL   Comment 1 Repeat Test   Glucose, capillary     Status: Abnormal   Collection Time: 03/30/16  5:09 PM  Result Value Ref Range   Glucose-Capillary 563 (HH) 65 - 99 mg/dL   Comment 1 Notify RN   Glucose, capillary     Status: Abnormal   Collection Time: 03/30/16  6:09 PM  Result Value Ref Range   Glucose-Capillary 515 (HH) 65 - 99 mg/dL  Glucose, capillary     Status: Abnormal   Collection Time: 03/30/16  7:02 PM  Result Value Ref Range   Glucose-Capillary 396 (H) 65 - 99  mg/dL  Glucose, capillary     Status: Abnormal   Collection Time: 03/30/16  7:54 PM  Result Value Ref Range   Glucose-Capillary 348 (H) 65 - 99 mg/dL   Comment 1 Notify RN   Glucose, capillary     Status: Abnormal   Collection Time: 03/30/16  8:58 PM  Result Value Ref Range   Glucose-Capillary 272 (H) 65 - 99 mg/dL  Glucose, capillary     Status: Abnormal   Collection Time: 03/30/16 10:27 PM  Result Value Ref Range   Glucose-Capillary 291 (H) 65 - 99 mg/dL  Glucose, capillary     Status: Abnormal   Collection Time: 03/31/16  5:46 AM  Result Value Ref Range   Glucose-Capillary 221 (H) 65 - 99 mg/dL   Comment 1 Notify RN   Basic metabolic panel     Status: Abnormal   Collection Time: 03/31/16  8:20 AM  Result Value Ref Range   Sodium 132 (L) 135 - 145 mmol/L   Potassium 4.4 3.5 - 5.1 mmol/L   Chloride 98 (L) 101 - 111 mmol/L   CO2 27 22 - 32 mmol/L   Glucose, Bld 437 (H) 65 - 99 mg/dL   BUN 22 (H) 6 - 20 mg/dL   Creatinine, Ser 0.88 0.61 - 1.24 mg/dL   Calcium 8.9 8.9 - 10.3 mg/dL   GFR calc non Af Amer >60 >60 mL/min   GFR calc Af Amer >60 >60 mL/min    Comment: (NOTE) The eGFR has been calculated using the CKD EPI equation. This calculation has not been validated in all clinical situations. eGFR's persistently <60 mL/min signify possible Chronic Kidney Disease.    Anion gap 7 5 - 15    Comment: Performed at [facility]  Magnesium     Status: Abnormal   Collection Time: 03/31/16  8:20 AM  Result Value Ref Range   Magnesium 1.6 (L) 1.7 - 2.4 mg/dL    Comment: Performed at [facility]  Hemoglobin A1c     Status: Abnormal   Collection Time: 03/31/16  8:20 AM  Result Value Ref Range   Hgb A1c MFr Bld 12.2 (H) 4.8 - 5.6 %    Comment: (NOTE)         Pre-diabetes: 5.7 - 6.4         Diabetes: >6.4         Glycemic control for adults with diabetes: <7.0    Mean Plasma Glucose 303 mg/dL    Comment: (NOTE) Performed At: [facility] [place], [place] [id number] Lindon Romp MD [id number] Performed at [facility]   Glucose, capillary     Status: Abnormal   Collection Time: 03/31/16 10:44 AM  Result Value Ref Range   Glucose-Capillary 370 (H) 65 - 99 mg/dL   Comment 1 Notify RN   Glucose, capillary     Status: Abnormal   Collection Time: 03/31/16 12:12 PM  Result Value Ref Range   Glucose-Capillary 394 (H) 65 - 99 mg/dL  Glucose, capillary     Status: Abnormal   Collection Time: 03/31/16  5:12 PM  Result Value Ref Range   Glucose-Capillary 476 (H) 65 - 99 mg/dL  Urinalysis, Routine w reflex microscopic     Status: Abnormal   Collection Time: 03/31/16  6:00 PM  Result Value Ref Range   Color, Urine STRAW (A) YELLOW   APPearance CLEAR CLEAR   Specific Gravity, Urine 1.023 1.005 - 1.030   pH 7.0 5.0 - 8.0   Glucose, UA >=500 (A) NEGATIVE mg/dL   Hgb urine dipstick NEGATIVE NEGATIVE   Bilirubin Urine NEGATIVE NEGATIVE   Ketones, ur NEGATIVE NEGATIVE mg/dL   Protein, ur NEGATIVE NEGATIVE mg/dL   Nitrite NEGATIVE NEGATIVE   Leukocytes, UA NEGATIVE NEGATIVE   RBC / HPF NONE SEEN 0 - 5 RBC/hpf   WBC, UA 0-5 0 - 5 WBC/hpf   Bacteria, UA NONE SEEN NONE SEEN   Squamous Epithelial / LPF 0-5 (A) NONE SEEN    Comment: Performed at [facility]  Vitamin B12     Status: None   Collection Time: 03/31/16  6:33 PM  Result Value Ref Range   Vitamin B-12 304 180 - 914 pg/mL    Comment: (NOTE) This assay is not validated for testing neonatal or myeloproliferative syndrome specimens for Vitamin B12 levels. Performed at [facility]   Glucose, capillary     Status: Abnormal   Collection Time: 03/31/16  9:17 PM  Result Value Ref Range   Glucose-Capillary 323 (H) 65 - 99 mg/dL  Glucose, capillary     Status: Abnormal   Collection Time: 04/01/16  6:14 AM  Result Value Ref Range   Glucose-Capillary 236 (H) 65 - 99 mg/dL  Glucose, capillary      Status: Abnormal   Collection Time: 04/01/16 11:59 AM  Result Value Ref Range   Glucose-Capillary 220 (H) 65 - 99 mg/dL   Blood Alcohol level:  Lab Results  Component Value Date   ETH <5 03/25/2016   ETH <5 69/62/9528   Metabolic Disorder Labs: Lab Results  Component Value Date   HGBA1C 12.2 (H) 03/31/2016   MPG 303 03/31/2016   MPG 332 02/23/2016   Lab Results  Component Value Date   PROLACTIN 7.8 03/27/2016   No results found for: CHOL, TRIG, HDL, CHOLHDL, VLDL, LDLCALC  Physical Findings: AIMS: Facial and Oral Movements Muscles of Facial Expression: None, normal Lips and Perioral Area: None, normal Jaw: None, normal Tongue: None, normal,Extremity Movements Upper (arms, wrists, hands, fingers): None, normal Lower (legs, knees, ankles, toes): None, normal, Trunk Movements Neck, shoulders, hips: None, normal, Overall Severity Severity of abnormal movements (highest score from questions above): None, normal Incapacitation due to abnormal movements: None, normal Patient's awareness of abnormal movements (rate only patient's report): No Awareness, Dental Status Current problems with teeth and/or dentures?: Yes Does patient usually wear dentures?: No  CIWA:    COWS:     Musculoskeletal: Strength & Muscle Tone: within normal limits Gait & Station: normal- walker for ambulation assistance Patient leans: N/A  Psychiatric Specialty Exam: Physical Exam  Nursing note and vitals reviewed. Constitutional: He is oriented to person, place, and time. He appears well-developed.  HENT:  Head: Normocephalic.  Neurological: He is alert and oriented to person, place, and time.  Psychiatric: He has a normal mood and affect. His behavior is normal.    Review of Systems  Psychiatric/Behavioral: Positive for depression. The patient is nervous/anxious and has insomnia.     Blood pressure 104/67, pulse 74, temperature 97.8 F (36.6 C), temperature source Oral, resp. rate 16, height  '5\' 9"'$  (1.753 m), weight 59 kg (130 lb), SpO2 99 %.Body mass index is 19.2 kg/m.  General Appearance:Fairly Groomed  Eye Contact:  fair   Speech:  normal   Volume:  Normal  Mood:  depressed, but states mood is improved ,  anxious  Affect:  congruent, anxious and vaguely irritable  Thought Process:  linear  Orientation:  Full (Time, Place, and Person)  Thought Content:  denies hallucinations, no delusions, not internally preoccupied   Suicidal Thoughts:  Denies current suicidal plan or intention, contracts for safety on the unit, has reported some passive SI at times  Homicidal Thoughts:  No, denies any homicidal or violent ideations  Memory:  recent and remote grossly intact   Judgement:  Fair  Insight:  Fair  Psychomotor Activity:  decreased   Concentration:  Concentration: Fair and Attention Span: Fair  Recall:  [employer] of Knowledge:  Fair  Language:  Fair  Akathisia:  No  Handed:    AIMS (if indicated):  Assets:  Communication Skills Desire for Improvement Resilience Social Support  ADL's:  Intact  Cognition:  WNL  Sleep:  Number of Hours: 6.0     Assessment - patient reports partial improvement in mood, but continues to present depressed, with a restricted and vaguely irritable affect, and has endorsed intermittent passive SI, although denies any plan or intention of suicide or of hurting self . Ruminates about psychosocial stressors, and presents with poorly controlled DM. Tolerating medications well ( Prozac, Neurontin)   Treatment Plan Summary:  Daily contact with patient to assess and evaluate symptoms and progress in treatment and Medication management Encourage group and milieu participation to work on disposition planning options  For Depressed mood/anxiety: Will continue Prozac 20 mg QDAY   For Anxiety/Peripheral Neuropathy: Will continue  Neurontin 400 mg TID  For Anxiety disorder: Will continue Hydroxyzine 25 mg Q 8 hours PRN   For insomnia: Will  continue Doxepin  25 mg po QHS PRN  For DM management:  Will continue Insulin, GLucophage Hospitalist Consultation requested for assistance with DM management: Hospitalist recommendation; Lantus increased to 45 units, Meal time aspart increased to 10 units.  Encarnacion Slates, NP, PMHNP, FNP-BC 04/01/2016,  Patient ID: Morton Amy, male   DOB: 01-26-1966, 51 y.o.   MRN: [id number]

## 2016-04-01 NOTE — Progress Notes (Cosign Needed)
Patient ID: Kenneth [name], male   DOB: 12/10/1965, 51 y.o.   MRN: [id number]  Pt currently presents with an appropriate affect and cooperative behavior. Pt reports that he is concerned about going home because of isolation and his financial instabilty. Pt states "I am glad that my blood sugar is low but what is going to happen when I go home and cannot eat the same or afford my insulin?" Pt reports good sleep with current medication regimen.   Pt provided with medications per providers orders. Pt's labs and vitals were monitored throughout the night. Pt given a 1:1 about concerns he has about discharge. Pt supported emotionally and encouraged to express concerns and questions. Pt educated on medications and carb modified diet.   Pt's safety ensured with 15 minute and environmental checks. Pt currently denies SI/HI and A/V hallucinations. Pt verbally agrees to seek staff if SI/HI or A/VH occurs and to consult with staff before acting on any harmful thoughts. Pt shows some insight into his depression and triggering factors. Pt voices hope for the future, states "I have gotten more help within the past month than I have my whole life, I hope that I get Medicaid or Disability so I can take care of myself better." Will continue POC.

## 2016-04-01 NOTE — Progress Notes (Signed)
[facility] Group Notes:  (Nursing/MHT/Case Management/Adjunct)  Date:  04/01/2016  Time:  2100 Type of Therapy:  wrap up group  Participation Level:  Active  Participation Quality:  Appropriate, Attentive, Sharing and Supportive  Affect:  Appropriate  Cognitive:  Appropriate  Insight:  Improving  Engagement in Group:  Engaged  Modes of Intervention:  Clarification, Education and Support  Summary of Progress/Problems:  Marcille BuffyMcNeil, Ronita Hargreaves S 04/01/2016, 11:27 PM

## 2016-04-02 DIAGNOSIS — E118 Type 2 diabetes mellitus with unspecified complications: Secondary | ICD-10-CM

## 2016-04-02 LAB — GLUCOSE, CAPILLARY
GLUCOSE-CAPILLARY: 282 mg/dL — AB (ref 65–99)
Glucose-Capillary: 252 mg/dL — ABNORMAL HIGH (ref 65–99)
Glucose-Capillary: 344 mg/dL — ABNORMAL HIGH (ref 65–99)
Glucose-Capillary: 443 mg/dL — ABNORMAL HIGH (ref 65–99)

## 2016-04-02 MED ORDER — INSULIN ASPART 100 UNIT/ML ~~LOC~~ SOLN
0.0000 [IU] | Freq: Three times a day (TID) | SUBCUTANEOUS | Status: DC
  Administered 2016-04-02: 5 [IU] via SUBCUTANEOUS
  Administered 2016-04-02: 9 [IU] via SUBCUTANEOUS
  Administered 2016-04-03: 2 [IU] via SUBCUTANEOUS

## 2016-04-02 MED ORDER — INSULIN ASPART 100 UNIT/ML ~~LOC~~ SOLN: 2.0000 [IU] | Freq: Once | SUBCUTANEOUS | Status: DC

## 2016-04-02 MED ORDER — GABAPENTIN 400 MG PO CAPS
400.0000 mg | ORAL_CAPSULE | Freq: Three times a day (TID) | ORAL | Status: DC
  Administered 2016-04-02 – 2016-04-06 (×15): 400 mg via ORAL
  Filled 2016-04-02: qty 28
  Filled 2016-04-02 (×8): qty 1
  Filled 2016-04-02 (×2): qty 28
  Filled 2016-04-02 (×3): qty 1
  Filled 2016-04-02: qty 28
  Filled 2016-04-02 (×13): qty 1

## 2016-04-02 MED ORDER — INSULIN GLARGINE 100 UNIT/ML ~~LOC~~ SOLN: 55.0000 [IU] | Freq: Every evening | SUBCUTANEOUS | Status: DC

## 2016-04-02 MED ORDER — INSULIN ASPART PROT & ASPART (70-30 MIX) 100 UNIT/ML ~~LOC~~ SUSP: 50.0000 [IU] | Freq: Every day | SUBCUTANEOUS | Status: DC

## 2016-04-02 MED ORDER — INSULIN ASPART 100 UNIT/ML ~~LOC~~ SOLN
2.0000 [IU] | Freq: Once | SUBCUTANEOUS | Status: AC
  Administered 2016-04-02: 2 [IU] via SUBCUTANEOUS

## 2016-04-02 MED ORDER — INSULIN ASPART PROT & ASPART (70-30 MIX) 100 UNIT/ML ~~LOC~~ SUSP: 60.0000 [IU] | Freq: Every day | SUBCUTANEOUS | Status: DC

## 2016-04-02 MED ORDER — INSULIN ASPART 100 UNIT/ML ~~LOC~~ SOLN
5.0000 [IU] | Freq: Once | SUBCUTANEOUS | Status: AC
  Administered 2016-04-02: 5 [IU] via SUBCUTANEOUS

## 2016-04-02 NOTE — Progress Notes (Signed)
Recreation Therapy Notes  Date: 04/02/16 Time: 0930 Location: [address] Dayroom  Group Topic: Stress Management  Goal Area(s) Addresses:  Patient will verbalize importance of using healthy stress management.  Patient will identify positive emotions associated with healthy stress management.   Intervention: Stress Management  Activity :  Choice Meditation.  LRT introduced the stress management technique of meditation.  LRT played a meditation focused on choices to allow patients the opportunity to engage in the activity.  Patients were to follow along with the meditation as it played.  Education:  Stress Management, Discharge Planning.   Education Outcome: Acknowledges edcuation/In group clarification offered/Needs additional education  Clinical Observations/Feedback: Pt did not attend group.    Darris Carachure, LRT/CTRS         Miana Politte A 04/02/2016 12:43 PM 

## 2016-04-02 NOTE — Tx Team (Signed)
Interdisciplinary Treatment and Diagnostic Plan Update  04/02/2016 Time of Session: 9:30AM Kenneth [name] MRN: [id number]  Principal Diagnosis: MDD (major depressive disorder), recurrent severe, without psychosis ([name])  Secondary Diagnoses: Principal Problem:   MDD (major depressive disorder), recurrent severe, without psychosis ([name]) Active Problems:   Diabetes mellitus ([name])   Uncontrolled type 1 diabetes mellitus with hyperglycemia ([name])   Current Medications:  Current Facility-Administered Medications  Medication Dose Route Frequency Provider Last Rate Last Dose  . acetaminophen (TYLENOL) tablet 650 mg  650 mg Oral Q6H PRN [name], FNP   650 mg at 04/01/16 1402  . alum & mag hydroxide-simeth (MAALOX/MYLANTA) 200-200-20 MG/5ML suspension 30 mL  30 mL Oral Q4H PRN [name], FNP   30 mL at 03/31/16 2053  . doxepin (SINEQUAN) capsule 25 mg  25 mg Oral QHS PRN Jenne Campus, MD   25 mg at 04/01/16 2208  . FLUoxetine (PROZAC) capsule 20 mg  20 mg Oral Daily [name], FNP   20 mg at 04/01/16 0819  . gabapentin (NEURONTIN) capsule 400 mg  400 mg Oral TID [name], MD   400 mg at 04/01/16 1718  . hydrOXYzine (ATARAX/VISTARIL) tablet 25 mg  25 mg Oral TID PRN [name], FNP   25 mg at 04/01/16 2206  . insulin aspart (novoLOG) injection 0-9 Units  0-9 Units Subcutaneous TID WC Janece Canterbury, MD      . insulin aspart protamine- aspart (NOVOLOG MIX 70/30) injection 50 Units  50 Units Subcutaneous Q supper Janece Canterbury, MD      . insulin aspart protamine- aspart (NOVOLOG MIX 70/30) injection 60 Units  60 Units Subcutaneous Q breakfast Janece Canterbury, MD      . magnesium hydroxide (MILK OF MAGNESIA) suspension 30 mL  30 mL Oral Daily PRN [name], FNP      . meloxicam (MOBIC) tablet 15 mg  15 mg Oral Daily [name], FNP   15 mg at 04/01/16 0819  . metFORMIN (GLUCOPHAGE) tablet 1,000 mg  1,000 mg Oral BID WC Laverle Hobby, PA-C       PTA  Medications: Prescriptions Prior to Admission  Medication Sig Dispense Refill Last Dose  . acetaminophen-codeine (TYLENOL #3) 300-30 MG tablet Take 1 tablet by mouth every 6 (six) hours as needed for moderate pain. (Patient not taking: Reported on 03/25/2016) 30 tablet 0 Not Taking at Unknown time  . Blood Glucose Monitoring Suppl (TRUE METRIX AIR GLUCOSE METER) w/Device KIT 1 each by Does not apply route 4 (four) times daily -  before meals and at bedtime. 1 kit 0   . FLUoxetine (PROZAC) 20 MG capsule Take 1 capsule (20 mg total) by mouth daily. 30 capsule 2 03/25/2016 at Unknown time  . gabapentin (NEURONTIN) 300 MG capsule Take 1 capsule (300 mg total) by mouth 3 (three) times daily. 90 capsule 2 03/25/2016 at Unknown time  . glucose blood (TRUE METRIX BLOOD GLUCOSE TEST) test strip Use as instructed 100 each 12   . insulin aspart (NOVOLOG) 100 unit/mL injection Per sliding scale 1 vial 12   . insulin glargine (LANTUS) 100 UNIT/ML injection Inject 0.1 mLs (10 Units total) into the skin at bedtime. 10 mL 11   . Insulin Syringes, Disposable, U-100 0.5 ML MISC 1 each by Does not apply route 4 (four) times daily -  before meals and at bedtime. (Patient not taking: Reported on 03/25/2016) 100 each 5 Not Taking at Unknown time  Patient Stressors: Financial difficulties Health problems Substance abuse  Patient Strengths: Ability for Estate manager/land agent for treatment/growth Supportive family/friends  Treatment Modalities: Medication Management, Group therapy, Case management,  1 to 1 session with clinician, Psychoeducation, Recreational therapy.   Physician Treatment Plan for Primary Diagnosis: MDD (major depressive disorder), recurrent severe, without psychosis ([place]) Long Term Goal(s): Improvement in symptoms so as ready for discharge Improvement in symptoms so as ready for discharge   Short Term Goals: Ability to identify changes in lifestyle to reduce recurrence of condition  will improve Ability to verbalize feelings will improve Ability to disclose and discuss suicidal ideas Ability to demonstrate self-control will improve Ability to identify and develop effective coping behaviors will improve Ability to maintain clinical measurements within normal limits will improve Compliance with prescribed medications will improve Ability to identify triggers associated with substance abuse/mental health issues will improve Ability to identify changes in lifestyle to reduce recurrence of condition will improve Ability to verbalize feelings will improve Ability to disclose and discuss suicidal ideas Ability to demonstrate self-control will improve Ability to identify and develop effective coping behaviors will improve Ability to maintain clinical measurements within normal limits will improve Compliance with prescribed medications will improve Ability to identify triggers associated with substance abuse/mental health issues will improve  Medication Management: Evaluate patient's response, side effects, and tolerance of medication regimen.  Therapeutic Interventions: 1 to 1 sessions, Unit Group sessions and Medication administration.  Evaluation of Outcomes: Progressing  Physician Treatment Plan for Secondary Diagnosis: Principal Problem:   MDD (major depressive disorder), recurrent severe, without psychosis ([name]) Active Problems:   Diabetes mellitus ([place])   Uncontrolled type 1 diabetes mellitus with hyperglycemia ([place])  Long Term Goal(s): Improvement in symptoms so as ready for discharge Improvement in symptoms so as ready for discharge   Short Term Goals: Ability to identify changes in lifestyle to reduce recurrence of condition will improve Ability to verbalize feelings will improve Ability to disclose and discuss suicidal ideas Ability to demonstrate self-control will improve Ability to identify and develop effective coping behaviors will improve Ability to  maintain clinical measurements within normal limits will improve Compliance with prescribed medications will improve Ability to identify triggers associated with substance abuse/mental health issues will improve Ability to identify changes in lifestyle to reduce recurrence of condition will improve Ability to verbalize feelings will improve Ability to disclose and discuss suicidal ideas Ability to demonstrate self-control will improve Ability to identify and develop effective coping behaviors will improve Ability to maintain clinical measurements within normal limits will improve Compliance with prescribed medications will improve Ability to identify triggers associated with substance abuse/mental health issues will improve     Medication Management: Evaluate patient's response, side effects, and tolerance of medication regimen.  Therapeutic Interventions: 1 to 1 sessions, Unit Group sessions and Medication administration.  Evaluation of Outcomes: Progressing   RN Treatment Plan for Primary Diagnosis: MDD (major depressive disorder), recurrent severe, without psychosis ([place]) Long Term Goal(s): Knowledge of disease and therapeutic regimen to maintain health will improve  Short Term Goals: Ability to remain free from injury will improve, Ability to disclose and discuss suicidal ideas and Ability to identify and develop effective coping behaviors will improve  Medication Management: RN will administer medications as ordered by provider, will assess and evaluate patient's response and provide education to patient for prescribed medication. RN will report any adverse and/or side effects to prescribing provider.  Therapeutic Interventions: 1 on 1 counseling sessions, Psychoeducation, Medication administration, Evaluate responses  to treatment, Monitor vital signs and CBGs as ordered, Perform/monitor CIWA, COWS, AIMS and Fall Risk screenings as ordered, Perform wound care treatments as  ordered.  Evaluation of Outcomes: Progressing   LCSW Treatment Plan for Primary Diagnosis: MDD (major depressive disorder), recurrent severe, without psychosis ([place]) Long Term Goal(s): Safe transition to appropriate next level of care at discharge, Engage patient in therapeutic group addressing interpersonal concerns.  Short Term Goals: Engage patient in aftercare planning with referrals and resources, Facilitate patient progression through stages of change regarding substance use diagnoses and concerns and Identify triggers associated with mental health/substance abuse issues  Therapeutic Interventions: Assess for all discharge needs, 1 to 1 time with Social worker, Explore available resources and support systems, Assess for adequacy in community support network, Educate family and significant other(s) on suicide prevention, Complete Psychosocial Assessment, Interpersonal group therapy.  Evaluation of Outcomes: Progressing   Progress in Treatment: Attending groups: Yes. Participating in groups: Yes. Taking medication as prescribed: Yes. Toleration medication: Yes. Family/Significant other contact made: SPE completed with pt; pt declined to consent to family contact.  Patient understands diagnosis: Yes. Discussing patient identified problems/goals with staff: Yes. Medical problems stabilized or resolved: Yes. Denies suicidal/homicidal ideation: Yes. Issues/concerns per patient self-inventory: No. Other: n/a  New problem(s) identified: No, Describe:  n/a  New Short Term/Long Term Goal(s): medication management; detox; development of comprehensive mental wellness/sobriety plan.   Discharge Plan or Barriers: CSW assessing. Pt plans to go to [facility] at discharge. He reports that he plans to return home with parents and is in the process of going to court for disability. "I think being here should help with that. I need to get records from here." Utilization Review is looking  into possible in home health referral for support when pt returns home.   Reason for Continuation of Hospitalization: Depression Medication stabilization Withdrawal symptoms  Estimated Length of Stay: 2-4 days   Attendees: Patient: 04/02/2016 8:34 AM  Physician: Larene Beach MD; Dr. Shea Evans MD 04/02/2016 8:34 AM  Nursing: Precious Gilding RN 04/02/2016 8:34 AM  RN Care Manager: Lars Pinks CM 04/02/2016 8:34 AM  Social Worker: Nira Conn Smart, LCSW; Adriana Reams LCSW 04/02/2016 8:34 AM  Recreational Therapist:  04/02/2016 8:34 AM  Other: Samuel Jester NP; Lindell Spar NP 04/02/2016 8:34 AM  Other:  04/02/2016 8:34 AM  Other: 04/02/2016 8:34 AM    Scribe for Treatment Team: [place], LCSW 04/02/2016 8:34 AM

## 2016-04-02 NOTE — Progress Notes (Signed)
Reviewed CBG over the last 24 hours.  It appears that overall CBGs are trending down.  Isolated episode of hyperglycemia yesterday was likely due to noncompliance or eating before CBG was obtained.  AM CBG still elevated.  Will transition to 70/30 insulin 60 units in the AM and 50 units in the evening and continue to monitor.  Will also give low dose SSI during transition period.  Aware that he already received his AM insulin at 6:45AM this morning.  He should still be able to receive 70/30 insulin this morning because I have not increased his total daily insulin requirement.  Low dose SSI deferred until noon today.    If anticipating discharge today would use:  Insulin 70/30 NPH/regular 60 units before breakfast and 50 units before dinner  Resume metformin Needs follow up within 1 week with PCP to review CBGs Repeat A1c in 3 months

## 2016-04-02 NOTE — Progress Notes (Signed)
Data. Patient denies SI/HI/AVH. Patient was very angry and upset this AM, due to not wanting to take his increased insulin injection. Patient states, "That much will kill me. Are you stupid? Don't you know that that much could kill me? And I'm not taking any of my meds this AM because you are trying to sneak things by me and put things in the cup that I have told people I don't want to take. Patient refused his 8 am Lantus 70/30 60 unit and all of his PO medications. MD/NP notified. He also refused his 5 PM Lantus 70/30 50 units. NP/MD notified and new Novolog order received and administered. Patient continued to be angry for quite some time, but was able to remain safe and appropriate in his conduct. Patient also stated, "These doctors are just trying to get my sugars under control so they can get me out of here. What's the use of getting my sugars under control, or of taking any medications, if I am not going to be able to afford then when I leave any way?"  On his self assessment patient reports 10/10 for depression and 8/10 for anxiety and hopelessness. His goal for today is: "To get better health and to stay positive".  Action. Emotional support and encouragement offered. Education provided on medication, indications and side effect. Q 15 minute checks done for safety. Response. Safety on the unit maintained through 15 minute checks.

## 2016-04-02 NOTE — [facility] Group Notes (Signed)
[facility] LCSW Group Therapy  04/02/2016 3:30 PM  Type of Therapy:  Group Therapy  Participation Level:  Did Not Attend-pt invited. Chose to remain in bed.   Summary of Progress/Problems: Feelings around Relapse. Group members discussed the meaning of relapse and shared personal stories of relapse, how it affected them and others, and how they perceived themselves during this time. Group members were encouraged to identify triggers, warning signs and coping skills used when facing the possibility of relapse. Social supports were discussed and explored in detail. Post Acute Withdrawal Syndrome (handout provided) was introduced and examined. Pt's were encouraged to ask questions, talk about key points associated with PAWS, and process this information in terms of relapse prevention.   Amarri Satterly N Smart LCSW 04/02/2016, 3:30 PM

## 2016-04-02 NOTE — Progress Notes (Signed)
[facility] MD Progress Note  04/02/2016 3:16 PM Kenneth [name]  MRN:  [id number]  Subjective: Kenneth [name] reports, "I'm not good. Hurting all over. They tried to give me too much insulin this morning. There is no way that I will feel better with much pain I'm having. I don't try to go to group because it is uncomfortable for me to sit down for a long time. I hurt all the time".  Objective:  I have discussed case with treatment team and have met with patient . Patient is a 51 year old man, presented to hospital due to worsening depression. Reports significant psychosocial stressors- lives alone, reports limited support network, states he cannot work due to his psychiatric and physical symptoms, no source of income. Patient reports history of DM, which has been poorly controlled, states " sometimes my level (referring to blood glucose level) has been more than a thousand". He also reports limited ambulation and history of falls/ lower extremity peripheral neuropathy. Refuses his diabetic regimen (Glucophage) most of the time. He ambulates with walker. Patient reports depressed mood , and states he is still feeling depressed due the generalized discomfort, continues to report significant anxiety, partly related to significant psychosocial stressors as reported above.  No disruptive or agitated behaviors on unit. As discussed with staff, he has reported ongoing depression, passive SI , but has contracted for safety on unit, and has focused on issues regarding Diabetes, food.   Principal Problem: MDD (major depressive disorder), recurrent severe, without psychosis ([place])  Diagnosis:   Patient Active Problem List   Diagnosis Date Noted  . Uncontrolled type 1 diabetes mellitus with hyperglycemia ([place]) [E10.65]   . MDD (major depressive disorder), recurrent severe, without psychosis ([place]) [F33.2] 03/27/2016  . Recurrent major depressive disorder ([place]) [F33.9]   . Abdominal pain [R10.9]   . Hyperglycemia [R73.9]  02/24/2016  . Volume depletion [E86.9] 12/21/2015  . Metabolic acidosis [M01.0] 12/21/2015  . Diabetes mellitus with nonketotic hyperosmolarity ([place]) [E11.00] 09/21/2014  . Noncompliance with medication regimen [Z91.14]   . Diabetes mellitus ([place]) [E11.9]   . DKA (diabetic ketoacidoses) ([place]) [E13.10] 07/24/2014  . Dehydration [E86.0] 07/24/2014  . Acute renal failure ([place]) [N17.9] 07/24/2014  . Hyperkalemia [E87.5] 07/24/2014  . Hyponatremia [E87.1] 07/24/2014  . Hyperthyroidism [E05.90] 07/24/2014   Total Time spent with patient:  20 minutes   Past Psychiatric History: Major depression.  Past Medical History:  Past Medical History:  Diagnosis Date  . Arthritis   . Diabetes mellitus   . DKA (diabetic ketoacidosis) ([place]) 07/2014  . Malnutrition ([place])   . Noncompliance with medication regimen   . Thyroid disease    History reviewed. No pertinent surgical history.  Family History:  Family History  Problem Relation Age of Onset  . Diabetes Mother   . Seizures Sister   . Diabetes Sister    Family Psychiatric  History: See H&P  Social History:  History  Alcohol Use No     History  Drug Use No    Social History   Social History  . Marital status: Divorced    Spouse name: N/A  . Number of children: N/A  . Years of education: N/A   Social History Main Topics  . Smoking status: Current Every Day Smoker    Packs/day: 0.50    Years: 8.00  . Smokeless tobacco: Never Used  . Alcohol use No  . Drug use: No  . Sexual activity: Not Asked   Other Topics Concern  . None  Social History Narrative  . None   Additional Social History:   Sleep: improved   Appetite:  Reports polyphagia, polyuria  Current Medications: Current Facility-Administered Medications  Medication Dose Route Frequency Provider Last Rate Last Dose  . acetaminophen (TYLENOL) tablet 650 mg  650 mg Oral Q6H PRN Truman Hayward, FNP   650 mg at 04/01/16 1402  . alum & mag hydroxide-simeth  (MAALOX/MYLANTA) 200-200-20 MG/5ML suspension 30 mL  30 mL Oral Q4H PRN Truman Hayward, FNP   30 mL at 04/02/16 1103  . doxepin (SINEQUAN) capsule 25 mg  25 mg Oral QHS PRN Craige Cotta, MD   25 mg at 04/01/16 2208  . FLUoxetine (PROZAC) capsule 20 mg  20 mg Oral Daily Truman Hayward, FNP   20 mg at 04/02/16 0835  . gabapentin (NEURONTIN) capsule 400 mg  400 mg Oral TID Thresa Ross, MD   400 mg at 04/02/16 1107  . hydrOXYzine (ATARAX/VISTARIL) tablet 25 mg  25 mg Oral TID PRN Truman Hayward, FNP   25 mg at 04/01/16 2206  . insulin aspart (novoLOG) injection 0-9 Units  0-9 Units Subcutaneous TID WC Renae Fickle, MD   5 Units at 04/02/16 1258  . insulin aspart protamine- aspart (NOVOLOG MIX 70/30) injection 50 Units  50 Units Subcutaneous Q supper Renae Fickle, MD      . insulin aspart protamine- aspart (NOVOLOG MIX 70/30) injection 60 Units  60 Units Subcutaneous Q breakfast Renae Fickle, MD      . magnesium hydroxide (MILK OF MAGNESIA) suspension 30 mL  30 mL Oral Daily PRN Truman Hayward, FNP      . meloxicam (MOBIC) tablet 15 mg  15 mg Oral Daily Beau Fanny, FNP   15 mg at 04/01/16 0819  . metFORMIN (GLUCOPHAGE) tablet 1,000 mg  1,000 mg Oral BID WC Kerry Hough, PA-C       Lab Results:  Results for orders placed or performed during the hospital encounter of 03/26/16 (from the past 48 hour(s))  Glucose, capillary     Status: Abnormal   Collection Time: 03/31/16  5:12 PM  Result Value Ref Range   Glucose-Capillary 476 (H) 65 - 99 mg/dL  Urinalysis, Routine w reflex microscopic     Status: Abnormal   Collection Time: 03/31/16  6:00 PM  Result Value Ref Range   Color, Urine STRAW (A) YELLOW   APPearance CLEAR CLEAR   Specific Gravity, Urine 1.023 1.005 - 1.030   pH 7.0 5.0 - 8.0   Glucose, UA >=500 (A) NEGATIVE mg/dL   Hgb urine dipstick NEGATIVE NEGATIVE   Bilirubin Urine NEGATIVE NEGATIVE   Ketones, ur NEGATIVE NEGATIVE mg/dL   Protein, ur NEGATIVE NEGATIVE  mg/dL   Nitrite NEGATIVE NEGATIVE   Leukocytes, UA NEGATIVE NEGATIVE   RBC / HPF NONE SEEN 0 - 5 RBC/hpf   WBC, UA 0-5 0 - 5 WBC/hpf   Bacteria, UA NONE SEEN NONE SEEN   Squamous Epithelial / LPF 0-5 (A) NONE SEEN    Comment: Performed at [facility]  Vitamin B12     Status: None   Collection Time: 03/31/16  6:33 PM  Result Value Ref Range   Vitamin B-12 304 180 - 914 pg/mL    Comment: (NOTE) This assay is not validated for testing neonatal or myeloproliferative syndrome specimens for Vitamin B12 levels. Performed at [facility]   Glucose, capillary     Status: Abnormal   Collection Time: 03/31/16  9:17 PM  Result Value Ref Range   Glucose-Capillary 323 (H) 65 - 99 mg/dL  Glucose, capillary     Status: Abnormal   Collection Time: 04/01/16  6:14 AM  Result Value Ref Range   Glucose-Capillary 236 (H) 65 - 99 mg/dL  Glucose, capillary     Status: Abnormal   Collection Time: 04/01/16 11:59 AM  Result Value Ref Range   Glucose-Capillary 220 (H) 65 - 99 mg/dL  Glucose, capillary     Status: Abnormal   Collection Time: 04/01/16  4:50 PM  Result Value Ref Range   Glucose-Capillary 401 (H) 65 - 99 mg/dL  Glucose, capillary     Status: Abnormal   Collection Time: 04/01/16  9:12 PM  Result Value Ref Range   Glucose-Capillary 140 (H) 65 - 99 mg/dL   Comment 1 Notify RN    Comment 2 Document in Chart   Glucose, capillary     Status: Abnormal   Collection Time: 04/02/16  6:20 AM  Result Value Ref Range   Glucose-Capillary 252 (H) 65 - 99 mg/dL  Glucose, capillary     Status: Abnormal   Collection Time: 04/02/16 12:04 PM  Result Value Ref Range   Glucose-Capillary 282 (H) 65 - 99 mg/dL   Comment 1 Notify RN    Comment 2 Document in Chart    Blood Alcohol level:  Lab Results  Component Value Date   ETH <5 03/25/2016   ETH <5 16/12/9602   Metabolic Disorder Labs: Lab Results  Component Value Date   HGBA1C 12.2 (H) 03/31/2016   MPG 303 03/31/2016    MPG 332 02/23/2016   Lab Results  Component Value Date   PROLACTIN 7.8 03/27/2016   No results found for: CHOL, TRIG, HDL, CHOLHDL, VLDL, LDLCALC  Physical Findings: AIMS: Facial and Oral Movements Muscles of Facial Expression: None, normal Lips and Perioral Area: None, normal Jaw: None, normal Tongue: None, normal,Extremity Movements Upper (arms, wrists, hands, fingers): None, normal Lower (legs, knees, ankles, toes): None, normal, Trunk Movements Neck, shoulders, hips: None, normal, Overall Severity Severity of abnormal movements (highest score from questions above): None, normal Incapacitation due to abnormal movements: None, normal Patient's awareness of abnormal movements (rate only patient's report): No Awareness, Dental Status Current problems with teeth and/or dentures?: Yes Does patient usually wear dentures?: No  CIWA:    COWS:     Musculoskeletal: Strength & Muscle Tone: within normal limits Gait & Station: normal- walker for ambulation assistance Patient leans: N/A  Psychiatric Specialty Exam: Physical Exam  Nursing note and vitals reviewed. Constitutional: He is oriented to person, place, and time. He appears well-developed.  HENT:  Head: Normocephalic.  Neurological: He is alert and oriented to person, place, and time.  Psychiatric: He has a normal mood and affect. His behavior is normal.    Review of Systems  Psychiatric/Behavioral: Positive for depression. The patient is nervous/anxious and has insomnia.     Blood pressure 119/76, pulse 87, temperature 97.8 F (36.6 C), temperature source Oral, resp. rate 16, height '5\' 9"'$  (1.753 m), weight 59 kg (130 lb), SpO2 99 %.Body mass index is 19.2 kg/m.  General Appearance:Fairly Groomed  Eye Contact:  fair   Speech:  normal   Volume:  Normal  Mood:  depressed, but states mood is improved ,  anxious  Affect:  congruent, anxious and vaguely irritable  Thought Process:  linear  Orientation:  Full (Time,  Place, and Person)  Thought Content:  denies hallucinations, no delusions, not  internally preoccupied   Suicidal Thoughts:  Denies current suicidal plan or intention, contracts for safety on the unit, has reported some passive SI at times  Homicidal Thoughts:  No, denies any homicidal or violent ideations  Memory:  recent and remote grossly intact   Judgement:  Fair  Insight:  Fair  Psychomotor Activity:  decreased   Concentration:  Concentration: Fair and Attention Span: Fair  Recall:  [employer] of Knowledge:  Fair  Language:  Fair  Akathisia:  No  Handed:    AIMS (if indicated):     Assets:  Communication Skills Desire for Improvement Resilience Social Support  ADL's:  Intact  Cognition:  WNL  Sleep:  Number of Hours: 6.0     Assessment - patient reports partial improvement in mood, but continues to present depressed, with a restricted and vaguely irritable affect, and has endorsed intermittent passive SI, although denies any plan or intention of suicide or of hurting self . Ruminates about psychosocial stressors, and presents with poorly controlled DM. Tolerating medications well ( Prozac, Neurontin)   Treatment Plan Summary:  Daily contact with patient to assess and evaluate symptoms and progress in treatment and Medication management Encourage group and milieu participation to work on disposition planning options  For Depressed mood/anxiety: Will continue Prozac 20 mg QDAY   For Anxiety/Peripheral Neuropathy: Will increase Neurontin 400 mg to QID  For Anxiety disorder: Will continue Hydroxyzine 25 mg Q 8 hours PRN   For insomnia: Will continue Doxepin  25 mg po QHS PRN  For DM management:  Will continue Insulin, GLucophage Hospitalist Consultation requested for assistance with DM management: Hospitalist recommendation; Lantus increased to 45 units, Meal time aspart increased to 10 units.  Encarnacion Slates, NP, PMHNP, FNP-BC 04/02/2016,  Patient ID: Morton Amy,  male   DOB: 1965/08/11, 51 y.o.   MRN: [id number] Agree with NP Progress Note

## 2016-04-03 DIAGNOSIS — E1165 Type 2 diabetes mellitus with hyperglycemia: Secondary | ICD-10-CM

## 2016-04-03 LAB — GLUCOSE, CAPILLARY
GLUCOSE-CAPILLARY: 551 mg/dL — AB (ref 65–99)
GLUCOSE-CAPILLARY: 234 mg/dL — AB (ref 65–99)
Glucose-Capillary: 194 mg/dL — ABNORMAL HIGH (ref 65–99)
Glucose-Capillary: 570 mg/dL (ref 65–99)
Glucose-Capillary: 464 mg/dL — ABNORMAL HIGH (ref 65–99)

## 2016-04-03 MED ORDER — INSULIN GLARGINE 100 UNIT/ML ~~LOC~~ SOLN: 45.0000 [IU] | Freq: Every day | SUBCUTANEOUS | Status: DC

## 2016-04-03 MED ORDER — INSULIN GLARGINE 100 UNIT/ML ~~LOC~~ SOLN: 30.0000 [IU] | Freq: Every day | SUBCUTANEOUS | Status: DC

## 2016-04-03 MED ORDER — GLUCERNA SHAKE PO LIQD
237.0000 mL | Freq: Two times a day (BID) | ORAL | Status: DC
  Administered 2016-04-04 – 2016-04-06 (×5): 237 mL via ORAL
  Filled 2016-04-03: qty 237

## 2016-04-03 MED ORDER — INSULIN ASPART PROT & ASPART (70-30 MIX) 100 UNIT/ML ~~LOC~~ SUSP: 30.0000 [IU] | Freq: Every day | SUBCUTANEOUS | Status: DC

## 2016-04-03 MED ORDER — INSULIN ASPART 100 UNIT/ML ~~LOC~~ SOLN: 10.0000 [IU] | Freq: Once | SUBCUTANEOUS | Status: DC

## 2016-04-03 MED ORDER — INSULIN ASPART 100 UNIT/ML ~~LOC~~ SOLN
0.0000 [IU] | Freq: Every day | SUBCUTANEOUS | Status: DC
  Administered 2016-04-03 – 2016-04-05 (×2): 2 [IU] via SUBCUTANEOUS
  Administered 2016-04-05: 3 [IU] via SUBCUTANEOUS
  Filled 2016-04-03: qty 1

## 2016-04-03 MED ORDER — INSULIN ASPART PROT & ASPART (70-30 MIX) 100 UNIT/ML ~~LOC~~ SUSP: 40.0000 [IU] | Freq: Every day | SUBCUTANEOUS | Status: DC

## 2016-04-03 MED ORDER — INSULIN ASPART PROT & ASPART (70-30 MIX) 100 UNIT/ML ~~LOC~~ SUSP: 75.0000 [IU] | Freq: Every day | SUBCUTANEOUS | Status: DC

## 2016-04-03 MED ORDER — INSULIN ASPART 100 UNIT/ML ~~LOC~~ SOLN: 10.0000 [IU] | Freq: Three times a day (TID) | SUBCUTANEOUS | Status: DC

## 2016-04-03 MED ORDER — INSULIN ASPART PROT & ASPART (70-30 MIX) 100 UNIT/ML ~~LOC~~ SUSP: 50.0000 [IU] | Freq: Every day | SUBCUTANEOUS | Status: DC

## 2016-04-03 MED ORDER — INSULIN ASPART 100 UNIT/ML ~~LOC~~ SOLN
10.0000 [IU] | Freq: Once | SUBCUTANEOUS | Status: AC
  Administered 2016-04-03: 10 [IU] via SUBCUTANEOUS

## 2016-04-03 MED ORDER — INSULIN ASPART 100 UNIT/ML ~~LOC~~ SOLN
5.0000 [IU] | Freq: Three times a day (TID) | SUBCUTANEOUS | Status: DC
  Administered 2016-04-03 – 2016-04-05 (×5): 5 [IU] via SUBCUTANEOUS

## 2016-04-03 MED ORDER — INSULIN ASPART 100 UNIT/ML ~~LOC~~ SOLN
15.0000 [IU] | Freq: Once | SUBCUTANEOUS | Status: AC
  Administered 2016-04-03: 15 [IU] via SUBCUTANEOUS

## 2016-04-03 MED ORDER — INSULIN GLARGINE 100 UNIT/ML ~~LOC~~ SOLN
20.0000 [IU] | Freq: Every day | SUBCUTANEOUS | Status: DC
  Administered 2016-04-03 – 2016-04-04 (×2): 20 [IU] via SUBCUTANEOUS
  Filled 2016-04-03: qty 0.2

## 2016-04-03 MED ORDER — INSULIN ASPART PROT & ASPART (70-30 MIX) 100 UNIT/ML ~~LOC~~ SUSP: 60.0000 [IU] | Freq: Every day | SUBCUTANEOUS | Status: DC

## 2016-04-03 MED ORDER — INSULIN ASPART 100 UNIT/ML ~~LOC~~ SOLN
0.0000 [IU] | Freq: Three times a day (TID) | SUBCUTANEOUS | Status: DC
  Administered 2016-04-03 – 2016-04-04 (×3): 15 [IU] via SUBCUTANEOUS
  Administered 2016-04-04: 5 [IU] via SUBCUTANEOUS
  Administered 2016-04-05 (×2): 15 [IU] via SUBCUTANEOUS
  Administered 2016-04-05: 5 [IU] via SUBCUTANEOUS

## 2016-04-03 NOTE — Progress Notes (Signed)
[facility] MD Progress Note  04/03/2016 11:07 AM Kenneth [name]  MRN:  [id number]  Subjective:Patient reports Kenneth [name] been better. My pain could be better.My body aches all over.his neuropathy has spread, and I can barely walk. Im not taking no Lantus. I know what yall trying to do. Yall want to get my sugar down so that you can discharge me, Nope Im not falling for that. The government wont help me they give it to everybody else that want need it. I cant get my insulin either.    Objective:  I have discussed case with treatment team and have met with patient.  Patient is a 51 year old man, presented to hospital due to worsening depression. Reports significant psychosocial stressors- lives alone, reports limited support network, states he cannot work due to his psychiatric and physical symptoms (primarly uncontrolled DM r/t noncompliance), no source of income. Patient reports history of DM, which has been poorly controlled, which is primarily due to his noncompliance both at home and while in hospital. As noted per nursing staff Kenneth [name] has refused his Lantus, but will take his Novolog. Writer tried to discuss with patient about long acting and short acting insulin, however he was resistant to any new information provided. He states "yall trying to get my sugar down so you can discharge me. Yall cant let me go if my sugar is high. At least they wouldn't let me go across the street. Patient continues to be very labile and manipulative at this time. He is showing poor progress to treatment and does not appear vested in treatment as he does not attend groups and lies in the bed primarily all day. This is day 8 of his admission, and no progress has been made. He continues to report SI due to his nueropathic pain .He ambulates with walker. Patient reports depressed mood , and states he is still feeling depressed due the generalized discomfort, continues to report significant anxiety, partly related to significant  psychosocial stressors as reported above. No disruptive or agitated behaviors on unit. As discussed with staff, he has reported ongoing depression, passive SI , but has contracted for safety on unit, and has focused on issues regarding Diabetes, food.   Principal Problem: MDD (major depressive disorder), recurrent severe, without psychosis ([place])  Diagnosis:   Patient Active Problem List   Diagnosis Date Noted  . Uncontrolled type 1 diabetes mellitus with hyperglycemia ([place]) [E10.65]   . MDD (major depressive disorder), recurrent severe, without psychosis ([place]) [F33.2] 03/27/2016  . Recurrent major depressive disorder ([place]) [F33.9]   . Abdominal pain [R10.9]   . Hyperglycemia [R73.9] 02/24/2016  . Volume depletion [E86.9] 12/21/2015  . Metabolic acidosis [B34.1] 12/21/2015  . Diabetes mellitus with nonketotic hyperosmolarity ([place]) [E11.00] 09/21/2014  . Noncompliance with medication regimen [Z91.14]   . Diabetes mellitus ([place]) [E11.9]   . DKA (diabetic ketoacidoses) ([place]) [E13.10] 07/24/2014  . Dehydration [E86.0] 07/24/2014  . Acute renal failure ([place]) [N17.9] 07/24/2014  . Hyperkalemia [E87.5] 07/24/2014  . Hyponatremia [E87.1] 07/24/2014  . Hyperthyroidism [E05.90] 07/24/2014   Total Time spent with patient:  20 minutes   Past Psychiatric History: Major depression.  Past Medical History:  Past Medical History:  Diagnosis Date  . Arthritis   . Diabetes mellitus   . DKA (diabetic ketoacidosis) ([place]) 07/2014  . Malnutrition ([place])   . Noncompliance with medication regimen   . Thyroid disease    History reviewed. No pertinent surgical history.  Family History:  Family History  Problem Relation  Age of Onset  . Diabetes Mother   . Seizures Sister   . Diabetes Sister    Family Psychiatric  History: See H&P  Social History:  History  Alcohol Use No     History  Drug Use No    Social History   Social History  . Marital status: Divorced    Spouse name: N/A  . Number  of children: N/A  . Years of education: N/A   Social History Main Topics  . Smoking status: Current Every Day Smoker    Packs/day: 0.50    Years: 8.00  . Smokeless tobacco: Never Used  . Alcohol use No  . Drug use: No  . Sexual activity: Not Asked   Other Topics Concern  . None   Social History Narrative  . None   Additional Social History:   Sleep: improved   Appetite:  Reports polyphagia, polyuria  Current Medications: Current Facility-Administered Medications  Medication Dose Route Frequency Provider Last Rate Last Dose  . acetaminophen (TYLENOL) tablet 650 mg  650 mg Oral Q6H PRN [name], FNP   650 mg at 04/03/16 0948  . alum & mag hydroxide-simeth (MAALOX/MYLANTA) 200-200-20 MG/5ML suspension 30 mL  30 mL Oral Q4H PRN [name], FNP   30 mL at 04/02/16 1103  . doxepin (SINEQUAN) capsule 25 mg  25 mg Oral QHS PRN Jenne Campus, MD   25 mg at 04/02/16 2213  . FLUoxetine (PROZAC) capsule 20 mg  20 mg Oral Daily [name], FNP   20 mg at 04/03/16 0946  . gabapentin (NEURONTIN) capsule 400 mg  400 mg Oral TID PC & HS Encarnacion Slates, NP   400 mg at 04/03/16 0945  . hydrOXYzine (ATARAX/VISTARIL) tablet 25 mg  25 mg Oral TID PRN [name], FNP   25 mg at 04/01/16 2206  . insulin aspart protamine- aspart (NOVOLOG MIX 70/30) injection 30 Units  30 Units Subcutaneous Q supper Janece Canterbury, MD      . insulin aspart protamine- aspart (NOVOLOG MIX 70/30) injection 40 Units  40 Units Subcutaneous Q breakfast Janece Canterbury, MD      . magnesium hydroxide (MILK OF MAGNESIA) suspension 30 mL  30 mL Oral Daily PRN [name], FNP      . meloxicam (MOBIC) tablet 15 mg  15 mg Oral Daily Encarnacion Slates, NP   15 mg at 04/03/16 0946  . metFORMIN (GLUCOPHAGE) tablet 1,000 mg  1,000 mg Oral BID WC Laverle Hobby, PA-C       Lab Results:  Results for orders placed or performed during the hospital encounter of 03/26/16 (from the past 48 hour(s))  Glucose,  capillary     Status: Abnormal   Collection Time: 04/01/16 11:59 AM  Result Value Ref Range   Glucose-Capillary 220 (H) 65 - 99 mg/dL  Glucose, capillary     Status: Abnormal   Collection Time: 04/01/16  4:50 PM  Result Value Ref Range   Glucose-Capillary 401 (H) 65 - 99 mg/dL  Glucose, capillary     Status: Abnormal   Collection Time: 04/01/16  9:12 PM  Result Value Ref Range   Glucose-Capillary 140 (H) 65 - 99 mg/dL   Comment 1 Notify RN    Comment 2 Document in Chart   Glucose, capillary     Status: Abnormal   Collection Time: 04/02/16  6:20 AM  Result Value Ref Range   Glucose-Capillary 252 (H) 65 - 99 mg/dL  Glucose, capillary     Status: Abnormal   Collection Time: 04/02/16 12:04 PM  Result Value Ref Range   Glucose-Capillary 282 (H) 65 - 99 mg/dL   Comment 1 Notify RN    Comment 2 Document in Chart   Glucose, capillary     Status: Abnormal   Collection Time: 04/02/16  5:08 PM  Result Value Ref Range   Glucose-Capillary 443 (H) 65 - 99 mg/dL   Comment 1 Notify RN    Comment 2 Document in Chart   Glucose, capillary     Status: Abnormal   Collection Time: 04/02/16 11:40 PM  Result Value Ref Range   Glucose-Capillary 344 (H) 65 - 99 mg/dL   Comment 1 Notify RN    Comment 2 Document in Chart   Glucose, capillary     Status: Abnormal   Collection Time: 04/03/16  6:12 AM  Result Value Ref Range   Glucose-Capillary 194 (H) 65 - 99 mg/dL   Blood Alcohol level:  Lab Results  Component Value Date   ETH <5 03/25/2016   ETH <5 81/77/1165   Metabolic Disorder Labs: Lab Results  Component Value Date   HGBA1C 12.2 (H) 03/31/2016   MPG 303 03/31/2016   MPG 332 02/23/2016   Lab Results  Component Value Date   PROLACTIN 7.8 03/27/2016   No results found for: CHOL, TRIG, HDL, CHOLHDL, VLDL, LDLCALC  Physical Findings: AIMS: Facial and Oral Movements Muscles of Facial Expression: None, normal Lips and Perioral Area: None, normal Jaw: None, normal Tongue: None,  normal,Extremity Movements Upper (arms, wrists, hands, fingers): None, normal Lower (legs, knees, ankles, toes): None, normal, Trunk Movements Neck, shoulders, hips: None, normal, Overall Severity Severity of abnormal movements (highest score from questions above): None, normal Incapacitation due to abnormal movements: None, normal Patient's awareness of abnormal movements (rate only patient's report): No Awareness, Dental Status Current problems with teeth and/or dentures?: Yes Does patient usually wear dentures?: No  CIWA:    COWS:     Musculoskeletal: Strength & Muscle Tone: within normal limits Gait & Station: normal- walker for ambulation assistance Patient leans: N/A  Psychiatric Specialty Exam: Physical Exam  Nursing note and vitals reviewed. Constitutional: He is oriented to person, place, and time. He appears well-developed.  HENT:  Head: Normocephalic.  Neurological: He is alert and oriented to person, place, and time.  Psychiatric: He has a normal mood and affect. His behavior is normal.    Review of Systems  Psychiatric/Behavioral: Positive for depression. The patient is nervous/anxious and has insomnia.     Blood pressure 109/77, pulse 75, temperature 97.7 F (36.5 C), resp. rate 16, height _0  (1.753 m), weight 59 kg (130 lb), SpO2 99 %.Body mass index is 19.2 kg/m.  General Appearance:Fairly Groomed  Eye Contact:  fair   Speech:  normal   Volume:  Normal  Mood:  depressed, but states mood is improved ,  anxious  Affect:  congruent, anxious and vaguely irritable  Thought Process:  linear  Orientation:  Full (Time, Place, and Person)  Thought Content:  denies hallucinations, no delusions, not internally preoccupied   Suicidal Thoughts:  Denies current suicidal plan or intention, contracts for safety on the unit, has reported some passive SI at times  Homicidal Thoughts:  No, denies any homicidal or violent ideations  Memory:  recent and remote grossly intact    Judgement:  Fair  Insight:  Fair  Psychomotor Activity:  decreased   Concentration:  Concentration: Fair and Attention  Span: Fair  Recall:  [employer] of Knowledge:  Fair  Language:  Fair  Akathisia:  No  Handed:    AIMS (if indicated):     Assets:  Communication Skills Desire for Improvement Resilience Social Support  ADL's:  Intact  Cognition:  WNL  Sleep:  Number of Hours: 6.0     Assessment - patient reports partial improvement in mood, but continues to present depressed, with a restricted and vaguely irritable affect, and has endorsed intermittent passive SI, although denies any plan or intention of suicide or of hurting self . Ruminates about psychosocial stressors, and presents with poorly controlled DM. Tolerating medications well ( Prozac, Neurontin)   Treatment Plan Summary:  Daily contact with patient to assess and evaluate symptoms and progress in treatment and Medication management Encourage group and milieu participation to work on disposition planning options  For Depressed mood/anxiety: Will continue Prozac 20 mg QDAY   For Anxiety/Peripheral Neuropathy: Will increase Neurontin 400 mg to QID  For Anxiety disorder: Will continue Hydroxyzine 25 mg Q 8 hours PRN   For insomnia: Will continue Doxepin  25 mg po QHS PRN  For DM management:  Will continue Insulin, GLucophage Hospitalist Consultation requested for assistance with DM management: Hospitalist recommendations continues however please make note patient is refusing all diabetes medication at this time. He notes taking 20 units of Lantus at home if he takes that much. ; Lantus increased to 60 units, Meal time aspart increased to 10 units.  [name], FNP,  04/03/2016,

## 2016-04-03 NOTE — Progress Notes (Signed)
[facility] Group Notes:  (Nursing/MHT/Case Management/Adjunct)  Date:  04/03/2016  Time:  2045  Type of Therapy:  wrap up group  Participation Level:  Active  Participation Quality:  Appropriate, Attentive, Sharing and Supportive  Affect:  Appropriate  Cognitive:  Appropriate  Insight:  Improving  Engagement in Group:  Engaged  Modes of Intervention:  Clarification, Education and Support  Summary of Progress/Problems: Pt shared that if he could change one thing he would like to be able to not struggle and support himself via disability money and to see his grandchild. Pt reported being grateful for people and family in his life.   Marcille BuffyMcNeil, Hailie Searight S 04/03/2016, 10:17 PM

## 2016-04-03 NOTE — Progress Notes (Signed)
Adult Psychoeducational Group Note  Date:  04/03/2016 Time:  1315  Group Topic/Focus:  Coping With Mental Health Crisis:   The purpose of this group is to help patients identify strategies for coping with mental health crisis.  Group discusses possible causes of crisis and ways to manage them effectively.   Participation Level:  Active  Participation Quality:  Inattentive  Affect:  Anxious  Cognitive:  Alert  Insight: Lacking  Engagement in Group:  Distracting  Modes of Intervention:  Discussion and Education  Additional Comments:   Jansen Sciuto L 04/03/2016, 3:38 PM

## 2016-04-03 NOTE — Progress Notes (Signed)
Nursing Progress Note: 7p-7a D: Pt currently presents with a pleasant/depressed affect and behavior. Pt states "I acted out today because no one told me anything about a medication change. I met with too many providers today and no one wanted to tell me sh*t. I trust yall but it really p*ssed me off. I feel fine now and I ain't gonna cause any trouble. I do appreciate all yall do. Sometimes I hear my parents call my name. They've been dead a while now but I hear em." Interacting minimally with milieu. Pt reports off and on sleep with current medication regimen.   A: Pt provided with medications per providers orders. Pt's labs and vitals were monitored throughout the night. Pt supported emotionally and encouraged to express concerns and questions. Pt educated on medications.  R: Pt's safety ensured with 15 minute and environmental checks. Pt currently denies SI/HI/Self Harm and visual hallucinations. Pt endorses AH. Pt verbally contracts to seek staff if SI/HI or A/VH occurs and to consult with staff before acting on any harmful thoughts. Will continue to monitor.

## 2016-04-03 NOTE — Progress Notes (Signed)
Patient bumped his head on the rim of the med window when he was getting his night time medicines. Pt then lowered himself to the ground on all fours. Patient got up without assistance. He complained of 6 out of 10 right side superficial head  pain. Vitals signs were WDL. Neuro check was WDL. Patient had no other complaints. Pt states, "that was so stupid of me. How could I have done that? I'm fine just got a little knot on my head." The NP was made aware and assessed the pt. [facility] Will continue to monitor.

## 2016-04-03 NOTE — [facility] Group Notes (Signed)
  [facility] LCSW Group Therapy Note  04/03/2016  at  10:00 AM  Type of Therapy and Topic:  Group Therapy: Avoiding Self-Sabotaging and Enabling Behaviors  Participation Level:  Did Not Attend; invited to participate yet did not despite overhead announcement and encouragement by staff  Kenneth [name] C Marinus Eicher, LCSW 

## 2016-04-03 NOTE — Progress Notes (Signed)
Data. Patient denies SI/HI/AVH.Patient continues to refuse his basal Lantus insulin and his PO Glucophage. Order decreased this AM, but patient continues to state, "It's too much. It will kill me". He requests that he just have sliding scale insulin based on his CBGs. NP notified and note left for treatment team. Patient did take his other medications this AM. Patient continues to isolate in his room for most of morning, but was in the milieu for most of afternoon. Patient is argumentative and irritabel.  Affect is flat and irritable. Patient's CBG at 12 noon was 570. Nurse spoke with the diabetes specialist,  Renae FickleMacKenzie Short, MD and she ordered 15 units of Novolog x 1. CBG to be rechecked in 2 hours (1400). Patient accepted insulin without difficulty. MD also stated she would visit patient today to explain his insulin changes and help him understand these. 1400 CBG still over 500. Dr. Malachi BondsShort MD notified. 10 units of Novolog ordered with a recheck in 2 hours.  Action. Emotional support and encouragement offered. Education provided on medication, indications and side effect. Q 15 minute checks done for safety. Response. Safety on the unit maintained through 15 minute checks.  Medications taken as prescribed. Attended groups.

## 2016-04-03 NOTE — Plan of Care (Signed)
Problem: Activity: Goal: Interest or engagement in leisure activities will improve Outcome: Progressing Patient noted out in the milieu more and affect is brightening with staff and peers.

## 2016-04-03 NOTE — Progress Notes (Signed)
PROGRESS NOTE  Kenneth [name]  DPO:[id number] DOB: [birth year] DOA: 03/26/2016 PCP: Massie Maroon, FNP  Brief Narrative:   51 y.o. male with a history of IDDM, peripheral neuropathy, depression, and thyroid disorder.  Consulted by behavioral health for assistance with diabetes management.  He has been refusing insulin and CBGs have been in the 500s.  Agreed with him that his goal should be to stay 180-200 range.  Agreed to titrate up on his long acting insulin as risk of hypoglycemia is less with this.  Will NOT try to change to 70/30 insulin because he has received glargine and novolog from [facility] for free and he prefers those.    Assessment & Plan:   Principal Problem:   MDD (major depressive disorder), recurrent severe, without psychosis (HCC) Active Problems:   Diabetes mellitus (HCC)   Diabetes mellitus type 2, uncontrolled, with complications (HCC)  Uncontrolled diabetes mellitus, brittle diabetic, CBG starting to trend down -  Resume lantus 20 units tonight and plan to give additional lantus in AM based on CBG -  Aspart 5 units standing with meals -  Moderate dose SSI  -  Resume HS insulin -  Intolerant of metformin so will discontinue -  Supplement glucerna for two snacks during the day -  Will continue to follow.    MDD, per psychiatry   Antimicrobials:  Anti-infectives    None       Subjective:  Very afraid of hypoglycemia.  Has never been compliant with insulin due to inability to afford.  Cannot get foods healthy for diabetics due to social circumstances.  Would like to use glucerna because it has few carbs.    Objective: Vitals:   04/01/16 0813 04/02/16 0730 04/03/16 0700 04/03/16 0701  BP: 104/67 119/76 111/75 109/77  Pulse: 74 87 70 75  Resp:   16   Temp:   97.7 F (36.5 C)   TempSrc:      SpO2:      Weight:      Height:       No intake or output data in the 24 hours ending 04/03/16 1641 Filed Weights   03/26/16 1916    Weight: 59 kg (130 lb)    Examination:  General exam:  Thin Adult male.  No acute distress.  MSK:  Normal tone and bulk, no lower extremity edema   Data Reviewed: I have personally reviewed following labs and imaging studies  CBC: No results for input(s): WBC, NEUTROABS, HGB, HCT, MCV, PLT in the last 168 hours. Basic Metabolic Panel:  Recent Labs Lab 03/31/16 0820  NA 132*  K 4.4  CL 98*  CO2 27  GLUCOSE 437*  BUN 22*  CREATININE 0.88  CALCIUM 8.9  MG 1.6*   GFR: Estimated Creatinine Clearance: 81.9 mL/min (by C-G formula based on SCr of 0.88 mg/dL). Liver Function Tests: No results for input(s): AST, ALT, ALKPHOS, BILITOT, PROT, ALBUMIN in the last 168 hours. No results for input(s): LIPASE, AMYLASE in the last 168 hours. No results for input(s): AMMONIA in the last 168 hours. Coagulation Profile: No results for input(s): INR, PROTIME in the last 168 hours. Cardiac Enzymes: No results for input(s): CKTOTAL, CKMB, CKMBINDEX, TROPONINI in the last 168 hours. BNP (last 3 results) No results for input(s): PROBNP in the last 8760 hours. HbA1C: No results for input(s): HGBA1C in the last 72 hours. CBG:  Recent Labs Lab 04/02/16 2340 04/03/16 0612 04/03/16 1143 04/03/16 1403 04/03/16 1638  GLUCAP  344* 194* 570* 551* 464*   Lipid Profile: No results for input(s): CHOL, HDL, LDLCALC, TRIG, CHOLHDL, LDLDIRECT in the last 72 hours. Thyroid Function Tests: No results for input(s): TSH, T4TOTAL, FREET4, T3FREE, THYROIDAB in the last 72 hours. Anemia Panel:  Recent Labs  03/31/16 1833  VITAMINB12 304   Urine analysis:    Component Value Date/Time   COLORURINE STRAW (A) 03/31/2016 1800   APPEARANCEUR CLEAR 03/31/2016 1800   LABSPEC 1.023 03/31/2016 1800   PHURINE 7.0 03/31/2016 1800   GLUCOSEU >=500 (A) 03/31/2016 1800   HGBUR NEGATIVE 03/31/2016 1800   BILIRUBINUR NEGATIVE 03/31/2016 1800   KETONESUR NEGATIVE 03/31/2016 1800   PROTEINUR NEGATIVE  03/31/2016 1800   UROBILINOGEN 0.2 01/18/2015 2300   NITRITE NEGATIVE 03/31/2016 1800   LEUKOCYTESUR NEGATIVE 03/31/2016 1800   Sepsis Labs: @LABRCNTIP (procalcitonin:4,lacticidven:4)  )No results found for this or any previous visit (from the past 240 hour(s)).    Radiology Studies: No results found.   Scheduled Meds: . [START ON 04/04/2016] feeding supplement (GLUCERNA SHAKE)  237 mL Oral BID BM  . FLUoxetine  20 mg Oral Daily  . gabapentin  400 mg Oral TID PC & HS  . insulin aspart  0-15 Units Subcutaneous TID WC  . insulin aspart  0-5 Units Subcutaneous QHS  . insulin aspart  5 Units Subcutaneous TID WC  . insulin glargine  20 Units Subcutaneous QHS  . meloxicam  15 mg Oral Daily   Continuous Infusions:   LOS: 8 days    Time spent: 30 min    Renae FickleSHORT, Daquan Crapps, MD [facility] Pager [phone]  If 7PM-7AM, please contact night-coverage [email or web address] Password [email or web address] 04/03/2016, 4:41 PM

## 2016-04-03 NOTE — Progress Notes (Signed)
Patient received NO long-acting or moderate acting insulin yesterday because of patient refusal.  CBGs were back to the 400s and he received a couple of doses of one time aspart in addition to sliding scale insulin.  Will reduce 70/30 so that perhaps patient will be more willing to accept injections.  Stop SSI while titrating 70/30.  70/30 is the least expensive insulin available and injections are only twice a day which is usually helpful for patient compliance.

## 2016-04-04 ENCOUNTER — Encounter (HOSPITAL_COMMUNITY): Payer: Self-pay

## 2016-04-04 ENCOUNTER — Emergency Department (HOSPITAL_COMMUNITY): Admission: EM | Admit: 2016-04-04 | Discharge: 2016-04-04 | Discharge status: Absent without leave | Payer: Self-pay

## 2016-04-04 ENCOUNTER — Inpatient Hospital Stay (HOSPITAL_COMMUNITY): Payer: No Typology Code available for payment source

## 2016-04-04 LAB — I-STAT VENOUS BLOOD GAS, ED
Acid-Base Excess: 1 mmol/L (ref 0.0–2.0)
Bicarbonate: 26.5 mmol/L (ref 20.0–28.0)
O2 Saturation: 95 %
PCO2 VEN: 43.8 mmHg — AB (ref 44.0–60.0)
PH VEN: 7.39 (ref 7.250–7.430)
TCO2: 28 mmol/L (ref 0–100)
pO2, Ven: 76 mmHg — ABNORMAL HIGH (ref 32.0–45.0)

## 2016-04-04 LAB — COMPREHENSIVE METABOLIC PANEL
ALBUMIN: 3.4 g/dL — AB (ref 3.5–5.0)
ALT: 67 U/L — ABNORMAL HIGH (ref 17–63)
ANION GAP: 9 (ref 5–15)
AST: 68 U/L — ABNORMAL HIGH (ref 15–41)
Alkaline Phosphatase: 59 U/L (ref 38–126)
BILIRUBIN TOTAL: 0.4 mg/dL (ref 0.3–1.2)
BUN: 23 mg/dL — ABNORMAL HIGH (ref 6–20)
CALCIUM: 8.9 mg/dL (ref 8.9–10.3)
CO2: 27 mmol/L (ref 22–32)
Chloride: 99 mmol/L — ABNORMAL LOW (ref 101–111)
Creatinine, Ser: 0.7 mg/dL (ref 0.61–1.24)
Glucose, Bld: 356 mg/dL — ABNORMAL HIGH (ref 65–99)
POTASSIUM: 4.5 mmol/L (ref 3.5–5.1)
Sodium: 135 mmol/L (ref 135–145)
TOTAL PROTEIN: 5.9 g/dL — AB (ref 6.5–8.1)

## 2016-04-04 LAB — GLUCOSE, CAPILLARY
GLUCOSE-CAPILLARY: 382 mg/dL — AB (ref 65–99)
GLUCOSE-CAPILLARY: 303 mg/dL — AB (ref 65–99)
Glucose-Capillary: 228 mg/dL — ABNORMAL HIGH (ref 65–99)

## 2016-04-04 LAB — CBG MONITORING, ED: GLUCOSE-CAPILLARY: 318 mg/dL — AB (ref 65–99)

## 2016-04-04 LAB — CBC
HCT: 34.2 % — ABNORMAL LOW (ref 39.0–52.0)
HEMOGLOBIN: 11.7 g/dL — AB (ref 13.0–17.0)
MCH: 31.5 pg (ref 26.0–34.0)
MCHC: 34.2 g/dL (ref 30.0–36.0)
MCV: 91.9 fL (ref 78.0–100.0)
PLATELETS: 168 10*3/uL (ref 150–400)
RBC: 3.72 MIL/uL — ABNORMAL LOW (ref 4.22–5.81)
RDW: 12.5 % (ref 11.5–15.5)
WBC: 7 10*3/uL (ref 4.0–10.5)

## 2016-04-04 LAB — I-STAT TROPONIN, ED: TROPONIN I, POC: 0 ng/mL (ref 0.00–0.08)

## 2016-04-04 LAB — LIPASE, BLOOD: LIPASE: 48 U/L (ref 11–51)

## 2016-04-04 MED ORDER — SODIUM CHLORIDE 0.9 % IV BOLUS (SEPSIS)
500.0000 mL | Freq: Once | INTRAVENOUS | Status: AC
  Administered 2016-04-04: 500 mL via INTRAVENOUS

## 2016-04-04 MED ORDER — IOPAMIDOL (ISOVUE-370) INJECTION 76%
INTRAVENOUS | Status: AC
  Administered 2016-04-04: 100 mL
  Filled 2016-04-04: qty 100

## 2016-04-04 MED ORDER — MORPHINE SULFATE (PF) 4 MG/ML IV SOLN
2.0000 mg | Freq: Once | INTRAVENOUS | Status: AC
  Administered 2016-04-04: 2 mg via INTRAVENOUS
  Filled 2016-04-04: qty 1

## 2016-04-04 NOTE — Progress Notes (Signed)
[facility] MD Progress Note  04/04/2016 12:19 PM Kenneth [name]  MRN:  [id number]  Subjective: I been in group today. Yesterday was a very positive day. Im going to try and get [facility] financial assistance. I have applied for it 3 times in the past but I would like to do that before I leave. I think they going to help me with medicaid as well. Im comfortable when Im here but when I leave is when it becomes a problem.   Objective:  I have discussed case with treatment team and have met with patient.  Patient is a 51 year old man, presented to hospital due to worsening depression. Reports significant psychosocial stressors- lives alone, reports limited support network, states he cannot work due to his psychiatric and physical symptoms (primarly uncontrolled DM r/t noncompliance), no source of income.  Patient reports history of DM, which has been poorly controlled, which is primarily due to his noncompliance, cost, and inability to afford insurance. Internal medicine was consulted yesterday due to his refusal to take his insulin, and it has resulted in patient being compliant with his medication today. His mood is also improved, he is ambulatory on the unit, calm and cooperative. Significatn changes since yesterday when he was very depressed, irritable, and labile.His insight and judgement has improved because he is now looking forward to going home, and applying for financial hardship for insurance. He is also able to identify his triggers for depression, which is loneliness.He ambulates with walker. No disruptive or agitated behaviors on unit. As discussed with staff, he has reported ongoing depression, denies SI and has contracted for safety on unit.   Principal Problem: MDD (major depressive disorder), recurrent severe, without psychosis ([place])  Diagnosis:   Patient Active Problem List   Diagnosis Date Noted  . Diabetes mellitus type 2, uncontrolled, with complications (HCC) [K48.1, E11.65]   . MDD  (major depressive disorder), recurrent severe, without psychosis ([place]) [F33.2] 03/27/2016  . Recurrent major depressive disorder ([place]) [F33.9]   . Abdominal pain [R10.9]   . Hyperglycemia [R73.9] 02/24/2016  . Volume depletion [E86.9] 12/21/2015  . Metabolic acidosis [E56.3] 12/21/2015  . Diabetes mellitus with nonketotic hyperosmolarity ([place]) [E11.00] 09/21/2014  . Noncompliance with medication regimen [Z91.14]   . Diabetes mellitus ([place]) [E11.9]   . DKA (diabetic ketoacidoses) ([place]) [E13.10] 07/24/2014  . Dehydration [E86.0] 07/24/2014  . Acute renal failure ([place]) [N17.9] 07/24/2014  . Hyperkalemia [E87.5] 07/24/2014  . Hyponatremia [E87.1] 07/24/2014  . Hyperthyroidism [E05.90] 07/24/2014   Total Time spent with patient:  20 minutes   Past Psychiatric History: Major depression.  Past Medical History:  Past Medical History:  Diagnosis Date  . Arthritis   . Diabetes mellitus   . DKA (diabetic ketoacidosis) ([place]) 07/2014  . Malnutrition ([place])   . Noncompliance with medication regimen   . Thyroid disease    History reviewed. No pertinent surgical history.  Family History:  Family History  Problem Relation Age of Onset  . Diabetes Mother   . Seizures Sister   . Diabetes Sister    Family Psychiatric  History: See H&P  Social History:  History  Alcohol Use No     History  Drug Use No    Social History   Social History  . Marital status: Divorced    Spouse name: N/A  . Number of children: N/A  . Years of education: N/A   Social History Main Topics  . Smoking status: Current Every Day Smoker    Packs/day:  0.50    Years: 8.00  . Smokeless tobacco: Never Used  . Alcohol use No  . Drug use: No  . Sexual activity: Not Asked   Other Topics Concern  . None   Social History Narrative  . None   Additional Social History:   Sleep: improved   Appetite:  Reports polyphagia, polyuria  Current Medications: Current Facility-Administered Medications  Medication  Dose Route Frequency Provider Last Rate Last Dose  . acetaminophen (TYLENOL) tablet 650 mg  650 mg Oral Q6H PRN [name], FNP   650 mg at 04/04/16 0857  . alum & mag hydroxide-simeth (MAALOX/MYLANTA) 200-200-20 MG/5ML suspension 30 mL  30 mL Oral Q4H PRN [name], FNP   30 mL at 04/02/16 1103  . doxepin (SINEQUAN) capsule 25 mg  25 mg Oral QHS PRN Jenne Campus, MD   25 mg at 04/03/16 2258  . feeding supplement (GLUCERNA SHAKE) (GLUCERNA SHAKE) liquid 237 mL  237 mL Oral BID BM Janece Canterbury, MD   237 mL at 04/04/16 0859  . FLUoxetine (PROZAC) capsule 20 mg  20 mg Oral Daily [name], FNP   20 mg at 04/04/16 0856  . gabapentin (NEURONTIN) capsule 400 mg  400 mg Oral TID PC & HS Encarnacion Slates, NP   400 mg at 04/04/16 0856  . hydrOXYzine (ATARAX/VISTARIL) tablet 25 mg  25 mg Oral TID PRN [name], FNP   25 mg at 04/01/16 2206  . insulin aspart (novoLOG) injection 0-15 Units  0-15 Units Subcutaneous TID WC Janece Canterbury, MD   15 Units at 04/04/16 1147  . insulin aspart (novoLOG) injection 0-5 Units  0-5 Units Subcutaneous QHS Janece Canterbury, MD   2 Units at 04/03/16 2142  . insulin aspart (novoLOG) injection 5 Units  5 Units Subcutaneous TID WC Janece Canterbury, MD   5 Units at 04/04/16 1147  . insulin glargine (LANTUS) injection 20 Units  20 Units Subcutaneous QHS Janece Canterbury, MD   20 Units at 04/03/16 2146  . magnesium hydroxide (MILK OF MAGNESIA) suspension 30 mL  30 mL Oral Daily PRN [name], FNP      . meloxicam (MOBIC) tablet 15 mg  15 mg Oral Daily Encarnacion Slates, NP   15 mg at 04/04/16 3474   Lab Results:  Results for orders placed or performed during the hospital encounter of 03/26/16 (from the past 48 hour(s))  Glucose, capillary     Status: Abnormal   Collection Time: 04/02/16  5:08 PM  Result Value Ref Range   Glucose-Capillary 443 (H) 65 - 99 mg/dL   Comment 1 Notify RN    Comment 2 Document in Chart   Glucose, capillary     Status:  Abnormal   Collection Time: 04/02/16 11:40 PM  Result Value Ref Range   Glucose-Capillary 344 (H) 65 - 99 mg/dL   Comment 1 Notify RN    Comment 2 Document in Chart   Glucose, capillary     Status: Abnormal   Collection Time: 04/03/16  6:12 AM  Result Value Ref Range   Glucose-Capillary 194 (H) 65 - 99 mg/dL  Glucose, capillary     Status: Abnormal   Collection Time: 04/03/16 11:43 AM  Result Value Ref Range   Glucose-Capillary 570 (HH) 65 - 99 mg/dL   Comment 1 Notify RN    Comment 2 Document in Chart   Glucose, capillary     Status: Abnormal   Collection Time: 04/03/16  2:03 PM  Result Value Ref Range   Glucose-Capillary 551 (HH) 65 - 99 mg/dL   Comment 1 Notify RN    Comment 2 Document in Chart   Glucose, capillary     Status: Abnormal   Collection Time: 04/03/16  4:38 PM  Result Value Ref Range   Glucose-Capillary 464 (H) 65 - 99 mg/dL  Glucose, capillary     Status: Abnormal   Collection Time: 04/03/16  8:54 PM  Result Value Ref Range   Glucose-Capillary 234 (H) 65 - 99 mg/dL   Comment 1 Notify RN    Comment 2 Document in Chart   Glucose, capillary     Status: Abnormal   Collection Time: 04/04/16  6:20 AM  Result Value Ref Range   Glucose-Capillary 228 (H) 65 - 99 mg/dL  Glucose, capillary     Status: Abnormal   Collection Time: 04/04/16 11:30 AM  Result Value Ref Range   Glucose-Capillary 382 (H) 65 - 99 mg/dL   Comment 1 Notify RN    Comment 2 Document in Chart    Blood Alcohol level:  Lab Results  Component Value Date   ETH <5 03/25/2016   ETH <5 35/70/1779   Metabolic Disorder Labs: Lab Results  Component Value Date   HGBA1C 12.2 (H) 03/31/2016   MPG 303 03/31/2016   MPG 332 02/23/2016   Lab Results  Component Value Date   PROLACTIN 7.8 03/27/2016   No results found for: CHOL, TRIG, HDL, CHOLHDL, VLDL, LDLCALC  Physical Findings: AIMS: Facial and Oral Movements Muscles of Facial Expression: None, normal Lips and Perioral Area: None,  normal Jaw: None, normal Tongue: None, normal,Extremity Movements Upper (arms, wrists, hands, fingers): None, normal Lower (legs, knees, ankles, toes): None, normal, Trunk Movements Neck, shoulders, hips: None, normal, Overall Severity Severity of abnormal movements (highest score from questions above): None, normal Incapacitation due to abnormal movements: None, normal Patient's awareness of abnormal movements (rate only patient's report): No Awareness, Dental Status Current problems with teeth and/or dentures?: Yes Does patient usually wear dentures?: No  CIWA:    COWS:     Musculoskeletal: Strength & Muscle Tone: within normal limits Gait & Station: normal- walker for ambulation assistance Patient leans: N/A  Psychiatric Specialty Exam: Physical Exam  Nursing note and vitals reviewed. Constitutional: He is oriented to person, place, and time. He appears well-developed.  HENT:  Head: Normocephalic.  Neurological: He is alert and oriented to person, place, and time.  Psychiatric: He has a normal mood and affect. His behavior is normal.    Review of Systems  Psychiatric/Behavioral: Positive for depression. The patient is nervous/anxious and has insomnia.     Blood pressure (!) 100/57, pulse 73, temperature 98.9 F (37.2 C), resp. rate 18, height '5\' 9"'$  (1.753 m), weight 59 kg (130 lb), SpO2 99 %.Body mass index is 19.2 kg/m.  General Appearance:Fairly Groomed  Eye Contact:  fair   Speech:  normal   Volume:  Normal  Mood:  depressed, clinically improved since yesterday  Affect:  congruent, appropriate   Thought Process:  linear, rumination  Orientation:  Full (Time, Place, and Person)  Thought Content:  denies hallucinations, no delusions, not internally preoccupied   Suicidal Thoughts:  Denies current suicidal plan or intention, contracts for safety on the unit, has reported some passive SI at times  Homicidal Thoughts:  No, denies any homicidal or violent ideations   Memory:  recent and remote grossly intact   Judgement:  Fair  Insight:  Fair  Psychomotor Activity:  decreased   Concentration:  Concentration: Fair and Attention Span: Fair  Recall:  [employer] of Knowledge:  Fair  Language:  Fair  Akathisia:  No  Handed:    AIMS (if indicated):     Assets:  Communication Skills Desire for Improvement Resilience Social Support  ADL's:  Intact  Cognition:  WNL  Sleep:  Number of Hours: 6.0     Assessment - patient reports partial improvement in mood, but continues to present depressed, with a restricted and vaguely irritable affect, and has endorsed intermittent passive SI, although denies any plan or intention of suicide or of hurting self . Ruminates about psychosocial stressors, and presents with poorly controlled DM. Tolerating medications well ( Prozac, Neurontin)   Treatment Plan Summary:  Daily contact with patient to assess and evaluate symptoms and progress in treatment and Medication management Encourage group and milieu participation to work on disposition planning options  For Depressed mood/anxiety: Will continue Prozac 20 mg QDAY   For Anxiety/Peripheral Neuropathy: Will increase Neurontin 400 mg to QID  For Anxiety disorder: Will continue Hydroxyzine 25 mg Q 8 hours PRN   For insomnia: Will continue Doxepin  25 mg po QHS PRN  For DM management:  Will continue with Hospitalist recommendations. Pt sugars have been between 300-400 for the past 24 hours.  [name], FNP,  04/04/2016,  Agree with NP Progress Note

## 2016-04-04 NOTE — [facility] Group Notes (Signed)
[facility] Group Notes:  (Nursing/MHT/Case Management/Adjunct)  Date:  04/04/2016  Time:  2:56 PM  Type of Therapy:  Nurse Education  Participation Level:  Active  Participation Quality:  Intrusive, Monopolizing and Resistant  Affect:  Angry, Defensive, Irritable and Resistant  Cognitive:  Alert and Lacking  Insight:  Lacking and Limited  Engagement in Group:  Defensive, Distracting, Lacking, Monopolizing, Off Topic, Poor and Resistant  Modes of Intervention:  Discussion and Education  Summary of Progress/Problems: Patient was loud, angry, disruptive and not redirectable during group. Patient did leave group toward the end, after banging on his walker and cursing. No insight and monopolizing throughout.  Almira Barenny G Toshiye Kever 04/04/2016, 2:56 PM

## 2016-04-04 NOTE — Progress Notes (Signed)
Data. Patient denies SI/HI/AVH. CBGs trending up again this shift. At 12noon CGB=382, and at 5pm= 459. Order received from NP to give patient 20 units total of Novolog at 5pm. Patient denies he over eats, or eats other than diabetic diet, however patient is frequently observed overeating at the cafe and will not be redirected. Patient refused to have the Glucerna shake at 1400, "I won't have that if I don't get to have a regular snack too. I will just have the regular snack." Patient has been irritable and angry for most of this shift. During group he became irate, yelling loudly and cussing and then hitting his walker. Patient was not redirectable. When nurse approached to assist patient with being calm, patient yelled, "Just get away from me." Patient did leave group of his own volition. Patient frequently gets angry when interactions with staff. No insight demonstrated.  Action. Emotional support and encouragement offered. Education provided on medication, indications and side effect. Q 15 minute checks done for safety. Response. Safety on the unit maintained through 15 minute checks.  Medications taken as prescribed. Attended groups.

## 2016-04-04 NOTE — Progress Notes (Signed)
No changes to insulin regimen for today.  Will monitor on current lantus 20 units qhs, aspart 5 with meals plus moderate dose SSI.

## 2016-04-04 NOTE — ED Triage Notes (Signed)
[facility] central chest pain that radiates to the left arm and back. Pt states it started about 3 days ago. Pt denies N/V, dizziness. No ASA or Nitro with EMS. VSS, NAD at this time.

## 2016-04-04 NOTE — ED Notes (Signed)
Patient transported to [facility] 

## 2016-04-04 NOTE — Progress Notes (Signed)
Per patient consent form, Kenneth [name] called and notified of patient's change in status.

## 2016-04-04 NOTE — Progress Notes (Signed)
Nursing Progress Note: 7p-7a D: Pt currently presents with a anxious/gamey/agitated affect and behavior. Pt states "I ain't sleeping in my room. I will sleep in the hall but I ain't sleeping in there. My roommate snores bad. You better find me a new room." Patiet currently sleeping in room. Interacting minimallly with milieu. Pt reports off and on sleep with current medication regimen.   A: Pt provided with medications per providers orders. Pt's labs and vitals were monitored throughout the night. Pt supported emotionally and encouraged to express concerns and questions. Pt educated on medications.  R: Pt's safety ensured with 15 minute and environmental checks. Pt currently denies SI/HI/Self Harm and A/V hallucinations. Pt verbally contracts to seek staff if SI/HI or A/VH occurs and to consult with staff before acting on any harmful thoughts. Will continue to monitor.

## 2016-04-04 NOTE — Progress Notes (Addendum)
At 2020 patient complained to writer of "chest pain 10/10". Patient reports he has "had sharp pain midsternum that travels to his back for 2-3 days". Patient says pain is unrelieved by antacids. VS taken and WNL. O2 sat 99%, HR 78. BP 126/78. CBG 303. NP Nira ConnJason Berry notified and patient assessed. Orders received to transfer patient to hospital by EMS. EMS arrived and report given. Patient is A&O x4. Patient's skin is cool, color is normal for ethnicity. Patient is not short of breath but reports "it is hard to breathe and swallow". Patient denies SI/HI/AVH. Patient left via EMS at 2050. [facility] Report given to charge nurse Evangeline GulaKaren RN.

## 2016-04-04 NOTE — Progress Notes (Deleted)
Patient reports chest pain 10/10 that radiates to back. Endorses difficulty breathing and swallowing. Denies dizziness, nausea, and vomiting. Alert and oriented x 4, respirations even and unlabored, VSS, no acute distress noted. Will transfer to emergency department for further evaluation.

## 2016-04-04 NOTE — ED Notes (Signed)
Patient transported to X-ray 

## 2016-04-04 NOTE — [facility] Group Notes (Signed)
[facility] LCSW Group Therapy Note   04/04/2016  10:00 until 11:00 AM   Type of Therapy and Topic: Group Therapy: Feelings Around Returning Home & Establishing a Supportive Framework and Activity to Identify signs of Improvement or Decompensation   Participation Level: Did Not attend; invited to participate yet did not despite overhead announcement and encouragement by staff     Carney Bernatherine C Harrill, LCSW

## 2016-04-05 LAB — GLUCOSE, CAPILLARY
GLUCOSE-CAPILLARY: 238 mg/dL — AB (ref 65–99)
Glucose-Capillary: 459 mg/dL — ABNORMAL HIGH (ref 65–99)
Glucose-Capillary: 249 mg/dL — ABNORMAL HIGH (ref 65–99)
Glucose-Capillary: 406 mg/dL — ABNORMAL HIGH (ref 65–99)
Glucose-Capillary: 358 mg/dL — ABNORMAL HIGH (ref 65–99)

## 2016-04-05 LAB — CBG MONITORING, ED: Glucose-Capillary: 280 mg/dL — ABNORMAL HIGH (ref 65–99)

## 2016-04-05 MED ORDER — PANTOPRAZOLE SODIUM 40 MG PO TBEC
40.0000 mg | DELAYED_RELEASE_TABLET | Freq: Every day | ORAL | Status: DC
  Administered 2016-04-05 – 2016-04-06 (×2): 40 mg via ORAL
  Filled 2016-04-05 (×2): qty 1
  Filled 2016-04-05: qty 7
  Filled 2016-04-05 (×2): qty 1

## 2016-04-05 MED ORDER — SODIUM CHLORIDE 0.9 % IV SOLN
80.0000 mg | Freq: Once | INTRAVENOUS | Status: AC
  Administered 2016-04-05: 01:00:00 80 mg via INTRAVENOUS
  Filled 2016-04-05: qty 80

## 2016-04-05 MED ORDER — GABAPENTIN 400 MG PO CAPS: 400.0000 mg | ORAL_CAPSULE | Freq: Three times a day (TID) | ORAL | 0 refills | Status: DC

## 2016-04-05 MED ORDER — HYDROXYZINE HCL 25 MG PO TABS: 25.0000 mg | ORAL_TABLET | Freq: Three times a day (TID) | ORAL | 0 refills | Status: DC | PRN

## 2016-04-05 MED ORDER — PANTOPRAZOLE SODIUM 40 MG PO TBEC: 40.0000 mg | DELAYED_RELEASE_TABLET | Freq: Every day | ORAL | 0 refills | Status: DC

## 2016-04-05 MED ORDER — INSULIN ASPART 100 UNIT/ML ~~LOC~~ SOLN: 8.0000 [IU] | Freq: Three times a day (TID) | SUBCUTANEOUS | 11 refills | Status: DC

## 2016-04-05 MED ORDER — INSULIN GLARGINE 100 UNIT/ML ~~LOC~~ SOLN: 10.0000 [IU] | Freq: Every day | SUBCUTANEOUS | 11 refills | Status: DC

## 2016-04-05 MED ORDER — FLUOXETINE HCL 20 MG PO CAPS: 20.0000 mg | ORAL_CAPSULE | Freq: Every day | ORAL | 0 refills | Status: DC

## 2016-04-05 MED ORDER — MORPHINE SULFATE (PF) 4 MG/ML IV SOLN
2.0000 mg | Freq: Once | INTRAVENOUS | Status: AC
  Administered 2016-04-05: 2 mg via INTRAVENOUS
  Filled 2016-04-05: qty 1

## 2016-04-05 MED ORDER — DOXEPIN HCL 25 MG PO CAPS: 25.0000 mg | ORAL_CAPSULE | Freq: Every evening | ORAL | 0 refills | Status: DC | PRN

## 2016-04-05 MED ORDER — INSULIN ASPART 100 UNIT/ML ~~LOC~~ SOLN
8.0000 [IU] | Freq: Three times a day (TID) | SUBCUTANEOUS | Status: DC
  Administered 2016-04-05 – 2016-04-06 (×3): 8 [IU] via SUBCUTANEOUS

## 2016-04-05 MED ORDER — MELOXICAM 15 MG PO TABS: 15.0000 mg | ORAL_TABLET | Freq: Every day | ORAL | 0 refills | Status: DC

## 2016-04-05 MED ORDER — INSULIN GLARGINE 100 UNIT/ML ~~LOC~~ SOLN
25.0000 [IU] | Freq: Every day | SUBCUTANEOUS | Status: DC
  Administered 2016-04-05: 25 [IU] via SUBCUTANEOUS

## 2016-04-05 NOTE — Progress Notes (Signed)
Nursing Progress Note 7p-7a  D) Patient presents flat and fatigued. Patient reports his pain is decreased to 4/10. Patient denies SI/HI or AVH. Patient contracts for safety at this time.   A) Emotional support given. Patient medicated with orders as prescribed. Medications reviewed with patient. Patient on q15 min safety checks. Opportunities for questions or concerns presented to patient.  R) Patient receptive to interaction with nurse. Patient remains safe on the unit at this time. Will continue to monitor.

## 2016-04-05 NOTE — Progress Notes (Signed)
Patient reports chest pain 10/10 that radiates to back. Endorses difficulty breathing and swallowing. Denies dizziness, nausea, and vomiting. Alert and oriented x 4, respirations even and unlabored, VSS, no acute distress noted. Will transfer to emergency department for further evaluation. 

## 2016-04-05 NOTE — [facility] Group Notes (Signed)
[facility] LCSW Group Therapy  04/05/2016 3:46 PM  Type of Therapy:  Group Therapy  Participation Level:  Active  Participation Quality:  Attentive  Affect:  Appropriate  Cognitive:  Alert and Oriented  Insight:  Improving  Engagement in Therapy:  Improving  Modes of Intervention:  Confrontation, Discussion, Education, Problem-solving, Socialization and Support  Summary of Progress/Problems: Today's Topic: Overcoming Obstacles. Patients identified one short term goal and potential obstacles in reaching this goal. Patients processed barriers involved in overcoming these obstacles. Patients identified steps necessary for overcoming these obstacles and explored motivation (internal and external) for facing these difficulties head on.   Rhyanna Sorce N Smart LCSW 04/05/2016, 3:46 PM

## 2016-04-05 NOTE — Progress Notes (Signed)
Patient returned to unit A&O x4. Patient was fatigued and is resting in bed now. Will continue to monitor.

## 2016-04-05 NOTE — Discharge Summary (Signed)
Physician Discharge Summary Note  Patient:  Kenneth [name] is an 51 y.o., male MRN:  [id number] DOB:  Aug 04, 1965 Patient phone:  [phone] (home)  Patient address:   [address] [place] [address],  Total Time spent with patient: 30 minutes  Date of Admission:  03/26/2016 Date of Discharge: 04/05/2006  Reason for Admission: PER TTS NOTES: "Kenneth Vandergrift Vernonis an 51 y.o.malepresenting to [facility] for suicidal ideations. Patient lives alone and doesn't feel safe at home. Patient has felt increasingly depressed for months. He describes his depression as hopelessness, isolating self from others, crying spells, loss of interest in usual pleasures, and worthlessness. Patient also with reported vegetative symptoms of staying in the bed, decreased grooming, and also not bathing. Trigger for suicidal ideations are related to poor support systems. Sts that he doesn't have anyone to assist him with getting food, paying bills, cleaning his home, etc. He reports not eating in 3-4 days because he doesn't have access to money to obtain food. Patient has filed for disability and he waiting to see if he has been approved to receive benefits. Patient is overall suicidal with no plan. He is however unable to contract for safety. He denies history of prior suicide attempts and/or gestures. No self mutilating behaviors. No HI. He reports auditory hallucinations of voices, "Screaming my name". No visual hallucinations reported. He denies alcohol and drug use. No history of alcohol and drug use. Patient does not have a psychiatrist or therapist. Sts that he is limited with ambulatating and uses a cane at home to assist with walking. No history of abuse. No family history of mental health illness."   Principal Problem: MDD (major depressive disorder), recurrent severe, without psychosis ([place]) Discharge Diagnoses: Patient Active Problem List   Diagnosis Date Noted  . Diabetes mellitus type 2, uncontrolled, with  complications (HCC) [Z99.3, E11.65]   . MDD (major depressive disorder), recurrent severe, without psychosis ([place]) [F33.2] 03/27/2016  . Recurrent major depressive disorder ([place]) [F33.9]   . Abdominal pain [R10.9]   . Hyperglycemia [R73.9] 02/24/2016  . Volume depletion [E86.9] 12/21/2015  . Metabolic acidosis [T70.1] 12/21/2015  . Diabetes mellitus with nonketotic hyperosmolarity ([place]) [E11.00] 09/21/2014  . Noncompliance with medication regimen [Z91.14]   . Diabetes mellitus ([place]) [E11.9]   . DKA (diabetic ketoacidoses) ([place]) [E13.10] 07/24/2014  . Dehydration [E86.0] 07/24/2014  . Acute renal failure ([place]) [N17.9] 07/24/2014  . Hyperkalemia [E87.5] 07/24/2014  . Hyponatremia [E87.1] 07/24/2014  . Hyperthyroidism [E05.90] 07/24/2014    Past Psychiatric History:   Past Medical History:  Past Medical History:  Diagnosis Date  . Arthritis   . Diabetes mellitus   . DKA (diabetic ketoacidosis) ([place]) 07/2014  . Malnutrition ([place])   . Noncompliance with medication regimen   . Thyroid disease    History reviewed. No pertinent surgical history. Family History:  Family History  Problem Relation Age of Onset  . Diabetes Mother   . Seizures Sister   . Diabetes Sister    Family Psychiatric  History:  Social History:  History  Alcohol Use No     History  Drug Use No    Social History   Social History  . Marital status: Divorced    Spouse name: N/A  . Number of children: N/A  . Years of education: N/A   Social History Main Topics  . Smoking status: Current Every Day Smoker    Packs/day: 0.50    Years: 8.00  . Smokeless tobacco: Never Used  . Alcohol  use No  . Drug use: No  . Sexual activity: Not Asked   Other Topics Concern  . None   Social History Narrative  . None    Hospital Course: Kenneth [name] was admitted for MDD (major depressive disorder), recurrent severe, without psychosis (HCC) , with psychosis and crisis management.  Pt was treated discharged with  the medications listed below under Medication List.  Medical problems were identified and treated as needed.  Home medications were restarted as appropriate.  Improvement was monitored by observation and Kenneth [name] 's daily report of symptom reduction.  Emotional and mental status was monitored by daily self-inventory reports completed by Kenneth [name] and clinical staff.         Kenneth [name] was evaluated by the treatment team for stability and plans for continued recovery upon discharge. Kenneth [name] 's motivation was an integral factor for scheduling further treatment. Employment, transportation, bed availability, health status, family support, and any pending legal issues were also considered during hospital stay. Pt was offered further treatment options upon discharge including but not limited to Residential, Intensive Outpatient, and Outpatient treatment.  Kenneth [name] will follow up with the services as listed below under Follow Up Information.    Upon completion of this admission the patient was both mentally and medically stable for discharge denying suicidal/homicidal ideation, auditory/visual/tactile hallucinations, delusional thoughts and paranoia.    Kenneth [name] responded well to treatment with Neurotion 400 mg , Prozac 20 mg and Doxepin 25 mg without adverse effects.. Pt demonstrated improvement without reported or observed adverse effects to the point of stability appropriate for outpatient management. Pertinent labs include: CBG's), Hgb A1C 12.2 (high), Magnesium 1.6 for which outpatient follow-up is necessary for lab recheck as mentioned below. Reviewed CBC, CMP, BAL, and UDS; all unremarkable aside from noted exceptions.   Physical Findings: AIMS: Facial and Oral Movements Muscles of Facial Expression: None, normal Lips and Perioral Area: None, normal Jaw: None, normal Tongue: None, normal,Extremity Movements Upper (arms, wrists, hands, fingers): None, normal Lower  (legs, knees, ankles, toes): None, normal, Trunk Movements Neck, shoulders, hips: None, normal, Overall Severity Severity of abnormal movements (highest score from questions above): None, normal Incapacitation due to abnormal movements: None, normal Patient's awareness of abnormal movements (rate only patient's report): No Awareness, Dental Status Current problems with teeth and/or dentures?: Yes Does patient usually wear dentures?: No  CIWA:    COWS:     Musculoskeletal: Strength & Muscle Tone: within normal limits Gait & Station: unsteady Patient leans: N/A  Psychiatric Specialty Exam: SEE SRA BY MD Physical Exam  ROS  Blood pressure 114/76, pulse 75, temperature 98 F (36.7 C), temperature source Oral, resp. rate 16, height 5' 9" (1.753 m), weight 59 kg (130 lb), SpO2 99 %.Body mass index is 19.2 kg/m.  Have you used any form of tobacco in the last 30 days? (Cigarettes, Smokeless Tobacco, Cigars, and/or Pipes): No  Has this patient used any form of tobacco in the last 30 days? (Cigarettes, Smokeless Tobacco, Cigars, and/or Pipes), No  Blood Alcohol level:  Lab Results  Component Value Date   ETH <5 03/25/2016   ETH <5 09/21/2014    Metabolic Disorder Labs:  Lab Results  Component Value Date   HGBA1C 12.2 (H) 03/31/2016   MPG 303 03/31/2016   MPG 332 02/23/2016   Lab Results  Component Value Date   PROLACTIN 7.8 03/27/2016   No results found for: CHOL, TRIG, HDL, CHOLHDL,   VLDL, [facility]  See Psychiatric Specialty Exam and Suicide Risk Assessment completed by Attending Physician prior to discharge.  Discharge destination:  Home  Is patient on multiple antipsychotic therapies at discharge:  No   Has Patient had three or more failed trials of antipsychotic monotherapy by history:  No  Recommended Plan for Multiple Antipsychotic Therapies: NA  Discharge Instructions    Diet - low sodium heart healthy    Complete by:  As directed    Discharge instructions     Complete by:  As directed    Take all medications as prescribed. Keep all follow-up appointments as scheduled.  Do not consume alcohol or use illegal drugs while on prescription medications. Report any adverse effects from your medications to your primary care provider promptly.  In the event of recurrent symptoms or worsening symptoms, call 911, a crisis hotline, or go to the nearest emergency department for evaluation.   Increase activity slowly    Complete by:  As directed      Allergies as of 04/05/2016   No Known Allergies     Medication List    STOP taking these medications   acetaminophen-codeine 300-30 MG tablet Commonly known as:  TYLENOL #3   Insulin Syringes (Disposable) U-100 0.5 ML Misc     TAKE these medications     Indication  doxepin 25 MG capsule Commonly known as:  SINEQUAN Take 1 capsule (25 mg total) by mouth at bedtime as needed (insomnia).  Indication:  Irritable Bowel Syndrome   FLUoxetine 20 MG capsule Commonly known as:  PROZAC Take 1 capsule (20 mg total) by mouth daily. Start taking on:  04/06/2016  Indication:  Depression   gabapentin 400 MG capsule Commonly known as:  NEURONTIN Take 1 capsule (400 mg total) by mouth 3 (three) times daily. What changed:  medication strength  how much to take  Indication:  Agitation, Diabetes with Nerve Disease   glucose blood test strip Commonly known as:  TRUE METRIX BLOOD GLUCOSE TEST Use as instructed  Indication:  Glucose Blood meter   hydrOXYzine 25 MG tablet Commonly known as:  ATARAX/VISTARIL Take 1 tablet (25 mg total) by mouth 3 (three) times daily as needed for anxiety.  Indication:  Anxiety Neurosis   insulin aspart 100 unit/mL injection Commonly known as:  novoLOG Per sliding scale What changed:  Another medication with the same name was added. Make sure you understand how and when to take each.  Indication:  Type 2 Diabetes   insulin aspart 100 UNIT/ML injection Commonly known as:   novoLOG Inject 8 Units into the skin 3 (three) times daily with meals. What changed:  You were already taking a medication with the same name, and this prescription was added. Make sure you understand how and when to take each.  Indication:  Type 2 Diabetes   insulin glargine 100 UNIT/ML injection Commonly known as:  LANTUS Inject 0.1 mLs (10 Units total) into the skin at bedtime.  Indication:  Type 2 Diabetes   meloxicam 15 MG tablet Commonly known as:  MOBIC Take 1 tablet (15 mg total) by mouth daily. Start taking on:  04/06/2016  Indication:  Joint Damage causing Pain and Loss of Function   pantoprazole 40 MG tablet Commonly known as:  PROTONIX Take 1 tablet (40 mg total) by mouth daily. Start taking on:  04/06/2016  Indication:  Ulcer of the Duodenum   TRUE METRIX AIR GLUCOSE METER w/Device Kit 1 each by Does not apply route 4 (  four) times daily -  before meals and at bedtime.  Indication:  CBG meter      Follow-up Information    [facility] Follow up.   Why:  Social worker will make an appointment for hospital follow-up. Contact information: [address] [address] [phone]           Follow-up recommendations:  Activity:  as tolerated Diet:  heart healthy  Comments:  Take all medications as prescribed. Keep all follow-up appointments as scheduled.  Do not consume alcohol or use illegal drugs while on prescription medications. Report any adverse effects from your medications to your primary care provider promptly.  In the event of recurrent symptoms or worsening symptoms, call 911, a crisis hotline, or go to the nearest emergency department for evaluation.   Signed: Derrill Center, NP 04/05/2016, 5:18 PM   Patient seen, Suicide Assessment Completed.  Disposition Plan Reviewed

## 2016-04-05 NOTE — Progress Notes (Signed)
Pt CBG 406 at noon. Writer notified Fredna Dowakia, NP.,  No additional coverage needed per Np. Pt given 23 u per sliding scale and meal coverage. Pt monitored by internal medicine per NP. Will continue to monitor.

## 2016-04-05 NOTE — Progress Notes (Signed)
[facility] MD Progress Note  04/05/2016 2:28 PM Kenneth [name]  MRN:  [id number]  Subjective: I didn't feel so good last night. They had to send me to the hospital and check my heart. They gave me dye and everything. Only thing they found was my liver. I hope I can talk with Nira Conn about medicaid today.   Objective:  I have discussed case with treatment team and have met with patient.  Patient is a 51 year old man, presented to hospital due to worsening depression. Reports significant psychosocial stressors- lives alone, reports limited support network, states he cannot work due to his psychiatric and physical symptoms (primarly uncontrolled DM r/t noncompliance), no source of income.  During the evaluation patient presents with improved mood and affect. He sat up and spoke appropriately with the writer upon approach. He does note that he has attended groups today and would like to rest now because he was in the ED until 4 am. His insight and judgement has improved because he is now looking forward to applying for medicaid. He reports his goal today is to talk with the Social worker after he awakes.  He is also able to identify his triggers for depression, which is loneliness. He ambulates with walker. No disruptive or agitated behaviors on unit. As discussed with staff, he has reported ongoing depression, denies SI and has contracted for safety on unit.   Principal Problem: MDD (major depressive disorder), recurrent severe, without psychosis ([place])  Diagnosis:   Patient Active Problem List   Diagnosis Date Noted  . Diabetes mellitus type 2, uncontrolled, with complications (HCC) [P29.5, E11.65]   . MDD (major depressive disorder), recurrent severe, without psychosis ([place]) [F33.2] 03/27/2016  . Recurrent major depressive disorder ([place]) [F33.9]   . Abdominal pain [R10.9]   . Hyperglycemia [R73.9] 02/24/2016  . Volume depletion [E86.9] 12/21/2015  . Metabolic acidosis [J88.4] 12/21/2015  . Diabetes  mellitus with nonketotic hyperosmolarity ([place]) [E11.00] 09/21/2014  . Noncompliance with medication regimen [Z91.14]   . Diabetes mellitus ([place]) [E11.9]   . DKA (diabetic ketoacidoses) ([place]) [E13.10] 07/24/2014  . Dehydration [E86.0] 07/24/2014  . Acute renal failure ([place]) [N17.9] 07/24/2014  . Hyperkalemia [E87.5] 07/24/2014  . Hyponatremia [E87.1] 07/24/2014  . Hyperthyroidism [E05.90] 07/24/2014   Total Time spent with patient:  20 minutes   Past Psychiatric History: Major depression.  Past Medical History:  Past Medical History:  Diagnosis Date  . Arthritis   . Diabetes mellitus   . DKA (diabetic ketoacidosis) ([place]) 07/2014  . Malnutrition ([place])   . Noncompliance with medication regimen   . Thyroid disease    History reviewed. No pertinent surgical history.  Family History:  Family History  Problem Relation Age of Onset  . Diabetes Mother   . Seizures Sister   . Diabetes Sister    Family Psychiatric  History: See H&P  Social History:  History  Alcohol Use No     History  Drug Use No    Social History   Social History  . Marital status: Divorced    Spouse name: N/A  . Number of children: N/A  . Years of education: N/A   Social History Main Topics  . Smoking status: Current Every Day Smoker    Packs/day: 0.50    Years: 8.00  . Smokeless tobacco: Never Used  . Alcohol use No  . Drug use: No  . Sexual activity: Not Asked   Other Topics Concern  . None   Social History Narrative  .  None   Additional Social History:   Sleep: improved   Appetite:  Reports polyphagia, polyuria  Current Medications: Current Facility-Administered Medications  Medication Dose Route Frequency Provider Last Rate Last Dose  . acetaminophen (TYLENOL) tablet 650 mg  650 mg Oral Q6H PRN [name], FNP   650 mg at 04/04/16 0857  . alum & mag hydroxide-simeth (MAALOX/MYLANTA) 200-200-20 MG/5ML suspension 30 mL  30 mL Oral Q4H PRN [name], FNP   30 mL at 04/04/16  1635  . doxepin (SINEQUAN) capsule 25 mg  25 mg Oral QHS PRN Jenne Campus, MD   25 mg at 04/03/16 2258  . feeding supplement (GLUCERNA SHAKE) (GLUCERNA SHAKE) liquid 237 mL  237 mL Oral BID BM Janece Canterbury, MD   237 mL at 04/05/16 1400  . FLUoxetine (PROZAC) capsule 20 mg  20 mg Oral Daily [name], FNP   20 mg at 04/05/16 6659  . gabapentin (NEURONTIN) capsule 400 mg  400 mg Oral TID PC & HS Encarnacion Slates, NP   400 mg at 04/05/16 1200  . hydrOXYzine (ATARAX/VISTARIL) tablet 25 mg  25 mg Oral TID PRN [name], FNP   25 mg at 04/01/16 2206  . insulin aspart (novoLOG) injection 0-15 Units  0-15 Units Subcutaneous TID WC Janece Canterbury, MD   15 Units at 04/05/16 1158  . insulin aspart (novoLOG) injection 0-5 Units  0-5 Units Subcutaneous QHS Janece Canterbury, MD   3 Units at 04/05/16 0050  . insulin aspart (novoLOG) injection 8 Units  8 Units Subcutaneous TID WC Janece Canterbury, MD   8 Units at 04/05/16 1159  . insulin glargine (LANTUS) injection 25 Units  25 Units Subcutaneous QHS Janece Canterbury, MD      . magnesium hydroxide (MILK OF MAGNESIA) suspension 30 mL  30 mL Oral Daily PRN [name], FNP      . meloxicam (MOBIC) tablet 15 mg  15 mg Oral Daily Encarnacion Slates, NP   15 mg at 04/05/16 0837  . pantoprazole (PROTONIX) EC tablet 40 mg  40 mg Oral Daily Rozetta Nunnery, NP   40 mg at 04/05/16 9357   Lab Results:  Results for orders placed or performed during the hospital encounter of 03/26/16 (from the past 48 hour(s))  Glucose, capillary     Status: Abnormal   Collection Time: 04/03/16  4:38 PM  Result Value Ref Range   Glucose-Capillary 464 (H) 65 - 99 mg/dL  Glucose, capillary     Status: Abnormal   Collection Time: 04/03/16  8:54 PM  Result Value Ref Range   Glucose-Capillary 234 (H) 65 - 99 mg/dL   Comment 1 Notify RN    Comment 2 Document in Chart   Glucose, capillary     Status: Abnormal   Collection Time: 04/04/16  6:20 AM  Result Value Ref Range    Glucose-Capillary 228 (H) 65 - 99 mg/dL  Glucose, capillary     Status: Abnormal   Collection Time: 04/04/16 11:30 AM  Result Value Ref Range   Glucose-Capillary 382 (H) 65 - 99 mg/dL   Comment 1 Notify RN    Comment 2 Document in Chart   Glucose, capillary     Status: Abnormal   Collection Time: 04/04/16  5:19 PM  Result Value Ref Range   Glucose-Capillary 459 (H) 65 - 99 mg/dL   Comment 1 Notify RN    Comment 2 Document in Chart   Glucose, capillary  Status: Abnormal   Collection Time: 04/04/16  7:00 PM  Result Value Ref Range   Glucose-Capillary 303 (H) 65 - 99 mg/dL   Comment 1 Notify RN    Comment 2 Document in Chart   CBC     Status: Abnormal   Collection Time: 04/04/16  9:33 PM  Result Value Ref Range   WBC 7.0 4.0 - 10.5 K/uL   RBC 3.72 (L) 4.22 - 5.81 MIL/uL   Hemoglobin 11.7 (L) 13.0 - 17.0 g/dL   HCT 34.2 (L) 39.0 - 52.0 %   MCV 91.9 78.0 - 100.0 fL   MCH 31.5 26.0 - 34.0 pg   MCHC 34.2 30.0 - 36.0 g/dL   RDW 12.5 11.5 - 15.5 %   Platelets 168 150 - 400 K/uL  I-stat troponin, ED     Status: None   Collection Time: 04/04/16  9:42 PM  Result Value Ref Range   Troponin i, poc 0.00 0.00 - 0.08 ng/mL   Comment 3            Comment: Due to the release kinetics of cTnI, a negative result within the first hours of the onset of symptoms does not rule out myocardial infarction with certainty. If myocardial infarction is still suspected, repeat the test at appropriate intervals.   Comprehensive metabolic panel     Status: Abnormal   Collection Time: 04/04/16  9:51 PM  Result Value Ref Range   Sodium 135 135 - 145 mmol/L   Potassium 4.5 3.5 - 5.1 mmol/L   Chloride 99 (L) 101 - 111 mmol/L   CO2 27 22 - 32 mmol/L   Glucose, Bld 356 (H) 65 - 99 mg/dL   BUN 23 (H) 6 - 20 mg/dL   Creatinine, Ser 0.70 0.61 - 1.24 mg/dL   Calcium 8.9 8.9 - 10.3 mg/dL   Total Protein 5.9 (L) 6.5 - 8.1 g/dL   Albumin 3.4 (L) 3.5 - 5.0 g/dL   AST 68 (H) 15 - 41 U/L   ALT 67 (H) 17 -  63 U/L   Alkaline Phosphatase 59 38 - 126 U/L   Total Bilirubin 0.4 0.3 - 1.2 mg/dL   GFR calc non Af Amer >60 >60 mL/min   GFR calc Af Amer >60 >60 mL/min    Comment: (NOTE) The eGFR has been calculated using the CKD EPI equation. This calculation has not been validated in all clinical situations. eGFR's persistently <60 mL/min signify possible Chronic Kidney Disease.    Anion gap 9 5 - 15  Lipase, blood     Status: None   Collection Time: 04/04/16  9:51 PM  Result Value Ref Range   Lipase 48 11 - 51 U/L  CBG monitoring, ED     Status: Abnormal   Collection Time: 04/04/16 10:32 PM  Result Value Ref Range   Glucose-Capillary 318 (H) 65 - 99 mg/dL  I-Stat venous blood gas, ED     Status: Abnormal   Collection Time: 04/04/16 11:02 PM  Result Value Ref Range   pH, Ven 7.390 7.250 - 7.430   pCO2, Ven 43.8 (L) 44.0 - 60.0 mmHg   pO2, Ven 76.0 (H) 32.0 - 45.0 mmHg   Bicarbonate 26.5 20.0 - 28.0 mmol/L   TCO2 28 0 - 100 mmol/L   O2 Saturation 95.0 %   Acid-Base Excess 1.0 0.0 - 2.0 mmol/L   Patient temperature 98.1 F    Sample type VENOUS   CBG monitoring, ED     Status:  Abnormal   Collection Time: 04/05/16 12:33 AM  Result Value Ref Range   Glucose-Capillary 280 (H) 65 - 99 mg/dL  Glucose, capillary     Status: Abnormal   Collection Time: 04/05/16  6:25 AM  Result Value Ref Range   Glucose-Capillary 249 (H) 65 - 99 mg/dL  Glucose, capillary     Status: Abnormal   Collection Time: 04/05/16 11:42 AM  Result Value Ref Range   Glucose-Capillary 406 (H) 65 - 99 mg/dL   Comment 1 Notify RN    Comment 2 Document in Chart    Blood Alcohol level:  Lab Results  Component Value Date   ETH <5 03/25/2016   ETH <5 62/37/6283   Metabolic Disorder Labs: Lab Results  Component Value Date   HGBA1C 12.2 (H) 03/31/2016   MPG 303 03/31/2016   MPG 332 02/23/2016   Lab Results  Component Value Date   PROLACTIN 7.8 03/27/2016   No results found for: CHOL, TRIG, HDL, CHOLHDL, VLDL,  LDLCALC  Physical Findings: AIMS: Facial and Oral Movements Muscles of Facial Expression: None, normal Lips and Perioral Area: None, normal Jaw: None, normal Tongue: None, normal,Extremity Movements Upper (arms, wrists, hands, fingers): None, normal Lower (legs, knees, ankles, toes): None, normal, Trunk Movements Neck, shoulders, hips: None, normal, Overall Severity Severity of abnormal movements (highest score from questions above): None, normal Incapacitation due to abnormal movements: None, normal Patient's awareness of abnormal movements (rate only patient's report): No Awareness, Dental Status Current problems with teeth and/or dentures?: Yes Does patient usually wear dentures?: No  CIWA:    COWS:     Musculoskeletal: Strength & Muscle Tone: within normal limits Gait & Station: normal- walker for ambulation assistance Patient leans: N/A  Psychiatric Specialty Exam: Physical Exam  Nursing note and vitals reviewed. Constitutional: He is oriented to person, place, and time. He appears well-developed.  HENT:  Head: Normocephalic.  Neurological: He is alert and oriented to person, place, and time.  Psychiatric: He has a normal mood and affect. His behavior is normal.    Review of Systems  Psychiatric/Behavioral: Positive for depression. The patient is nervous/anxious and has insomnia.     Blood pressure 114/76, pulse 75, temperature 98 F (36.7 C), temperature source Oral, resp. rate 16, height '5\' 9"'$  (1.753 m), weight 59 kg (130 lb), SpO2 99 %.Body mass index is 19.2 kg/m.  General Appearance:Fairly Groomed  Eye Contact:  fair   Speech:  normal   Volume:  Normal  Mood:  depressed, clinically improved since yesterday  Affect:  congruent, appropriate   Thought Process:  linear, rumination  Orientation:  Full (Time, Place, and Person)  Thought Content:  denies hallucinations, no delusions, not internally preoccupied   Suicidal Thoughts:  Denies current suicidal plan or  intention, contracts for safety on the unit, has reported some passive SI at times  Homicidal Thoughts:  No, denies any homicidal or violent ideations  Memory:  recent and remote grossly intact   Judgement:  Fair  Insight:  Fair  Psychomotor Activity:  decreased   Concentration:  Concentration: Fair and Attention Span: Fair  Recall:  [employer] of Knowledge:  Fair  Language:  Fair  Akathisia:  No  Handed:    AIMS (if indicated):     Assets:  Communication Skills Desire for Improvement Resilience Social Support  ADL's:  Intact  Cognition:  WNL  Sleep:  Number of Hours: 6.0     Assessment - patient reports partial improvement in mood, but  continues to present depressed, with a restricted and vaguely irritable affect, and has endorsed intermittent passive SI, although denies any plan or intention of suicide or of hurting self . Ruminates about psychosocial stressors, and presents with poorly controlled DM. Tolerating medications well ( Prozac, Neurontin)   Treatment Plan Summary:  Daily contact with patient to assess and evaluate symptoms and progress in treatment and Medication management Encourage group and milieu participation to work on disposition planning options  For Depressed mood/anxiety: Will continue Prozac 20 mg QDAY   For Anxiety/Peripheral Neuropathy: Will increase Neurontin 400 mg to QID  For Anxiety disorder: Will continue Hydroxyzine 25 mg Q 8 hours PRN   For insomnia: Will continue Doxepin  25 mg po QHS PRN  For DM management:  Will continue with Hospitalist recommendations. Pt sugars have been between 300-400 for the past 24 hours.  [name], FNP,  04/05/2016,  Agree with NP Progress Note

## 2016-04-05 NOTE — Progress Notes (Signed)
CSW spoke with pt about his upcoming discharge. Pt verbalized understanding that he would be discharged tomorrow and that he was to try to arrange transport home with his sister. Pt is asking for a letter from the doctor with his diagnosis and for medical records phone number. Pt reports that he would like assistance with any kind of temporary services until "my medicaid kicks in." CSW assessing resources available to him. Pt reports that [facility] comes a few times per week to see him. CSW assessing. [facility].   Trula SladeHeather Smart, MSW, LCSW Clinical Social Worker 04/05/2016 3:48 PM

## 2016-04-05 NOTE — [facility] Suicide Risk Assessment (Signed)
[facility] Discharge Suicide Risk Assessment   Principal Problem: MDD (major depressive disorder), recurrent severe, without psychosis (HCC) Discharge Diagnoses:  Patient Active Problem List   Diagnosis Date Noted  . Diabetes mellitus type 2, uncontrolled, with complications (HCC) [E11.8, E11.65]   . MDD (major depressive disorder), recurrent severe, without psychosis (HCC) [F33.2] 03/27/2016  . Recurrent major depressive disorder (HCC) [F33.9]   . Abdominal pain [R10.9]   . Hyperglycemia [R73.9] 02/24/2016  . Volume depletion [E86.9] 12/21/2015  . Metabolic acidosis [E87.2] 12/21/2015  . Diabetes mellitus with nonketotic hyperosmolarity (HCC) [E11.00] 09/21/2014  . Noncompliance with medication regimen [Z91.14]   . Diabetes mellitus (HCC) [E11.9]   . DKA (diabetic ketoacidoses) (HCC) [E13.10] 07/24/2014  . Dehydration [E86.0] 07/24/2014  . Acute renal failure (HCC) [N17.9] 07/24/2014  . Hyperkalemia [E87.5] 07/24/2014  . Hyponatremia [E87.1] 07/24/2014  . Hyperthyroidism [E05.90] 07/24/2014    Total Time spent with patient: 30 minutes  Musculoskeletal: Strength & Muscle Tone: within normal limits Gait & Station: normal Patient leans: N/A  Psychiatric Specialty Exam: ROS  Has frequent headaches, no chest pain , no shortness of breath, no nausea, no vomiting , no rash   Blood pressure 114/76, pulse 75, temperature 98 F (36.7 C), temperature source Oral, resp. rate 16, height 5\' 9"  (1.753 m), weight 59 kg (130 lb), SpO2 99 %.Body mass index is 19.2 kg/m.  General Appearance: Well Groomed  Patent attorney::  Fair  Speech:  Normal Rate409  Volume:  Normal  Mood:  states he is feeling better, less depressed   Affect:  more reactive   Thought Process:  Linear  Orientation:  Full (Time, Place, and Person)  Thought Content:  denies hallucinations, no delusions expressed   Suicidal Thoughts:  No- denies any suicidal or self injurious ideations, denies any homicidal ideations, no violent  ideations   Homicidal Thoughts:  No  Memory:  recent and remote grossly intact   Judgement:  Other:  improving   Insight:  improving   Psychomotor Activity:  Normal  Concentration:  Good  Recall:  Good  Fund of Knowledge:Good  Language: Good  Akathisia:  Negative  Handed:  Right  AIMS (if indicated):     Assets:  Desire for Improvement Resilience  Sleep:  Number of Hours: 1.75  Cognition: WNL  ADL's:  Improved    Mental Status Per Nursing Assessment::   On Admission:  Suicidal ideation indicated by patient, Suicide plan, Self-harm thoughts, Self-harm behaviors  Demographic Factors:  51 year old divorced male, lives alone   Loss Factors: No source of income, chronic medical illness ( DM)   Historical Factors: History of depression, denies history of suicide attempts   Risk Reduction Factors:   Positive coping skills or problem solving skills  Continued Clinical Symptoms:  Patient states he feels better than on admission - presents alert, attentive, well related, calm, mood is improving , affect is more reactive, states " I do feel a lot better than before I came in to the hospital ".  No thought disorder, no current hallucinations, no delusions expressed, does not appear internally preoccupied,  no suicidal or homicidal ideations . Of note patient states he is hoping for early AM discharge as this is the only time his sister can pick him up. Denies medication side effects.  * Of note, was sent to ED yesterday due to abdominal , chest discomfort, work up was negative. Today feeling  Better, no current significant chest pain or abdominal pain, no nausea or vomiting  today, no diarrhea , has had meals without symptoms.   Cognitive Features That Contribute To Risk:  No gross cognitive deficits noted upon discharge. Is alert , attentive, and oriented x 3   Suicide Risk:  Mild:  Suicidal ideation of limited frequency, intensity, duration, and specificity.  There are no  identifiable plans, no associated intent, mild dysphoria and related symptoms, good self-control (both objective and subjective assessment), few other risk factors, and identifiable protective factors, including available and accessible social support.  Follow-up Information    [facility] Follow up.   Why:  Social worker will make an appointment for hospital follow-up. Contact information: [address] [place] [id number] [phone]           Plan Of Care/Follow-up recommendations:  Activity:  as tolerated Diet:  Diabetic Diet Tests:  NA Other:  See below   Patient states he is leaving unit in good spirits States he has a ride to return home tomorrow ( early in AM) - sister is picking him up Has a P[facility] at [facility] for medical management /Diabetic management Follow up as above  Nehemiah MassedOBOS, Ashlea Dusing, MD 04/05/2016, 5:25 PM

## 2016-04-05 NOTE — Progress Notes (Signed)
D: Pt presents with depressed affect and mood.  He states "things is getting better."  Pt reports he is discharging tomorrow and he plans to "see my primary doctor, I'm in the process of getting my disability and my Medicaid."  Pt reports he feels safe to discharge tomorrow.  He denies SI/HI and denies hallucinations.  Pt has been visible in milieu interacting with peers appropriately.  He has been ambulating with his walker.    A: Introduced self to pt.  Support and encouragement offered.  Medication administered per order.  PRN medication administered for pain.    R: Pt is compliant with medications.  He verbally contracts for safety.  Will continue to monitor and assess.

## 2016-04-05 NOTE — Progress Notes (Signed)
See that patient was transferred to ED for chest pain overnight.  Determined to be low risk for PE, ACS, and cholecystitis and [facility].  Hyperglycemic yesterday so will increase insulin: -  Lantus 25 units QHS -  Standing aspart with meals to 8 units -  Continue moderate dose SSI

## 2016-04-05 NOTE — ED Provider Notes (Addendum)
[facility] DEPT Provider Note   CSN: [id number] Arrival date & time: 04/04/16  2117     History   Chief Complaint Chief Complaint  Patient presents with  . Chest Pain    HPI Kenneth [name] is a 51 y.o. male.  HPI  52 year old male inpatient behavioral health history of diabetes presents today with upper abdomen and lower chest pain which has been present constantly for a week. He states that it worsened today. He is brought to the ED from [facility] for further treatment and evaluation this. He describes the pain as moderate in nature and radiated through to his back. He has had some nausea and vomiting over the past week. He is unable to tell me anything that increases the pain or improve the pain. He has some dyspnea. He denies any fever or productive cough. He denies any diarrhea. The 2 episodes of vomiting or food related. He describes nonbilious nonbloody emesis. He denies any recent weight loss. He denies any abdominal surgeries. The pain has not been resolved for at least a week. He is a smoker but denies alcohol use he has a history of cocaine use but states he has not used cocaine for at least 14 months  Past Medical History:  Diagnosis Date  . Arthritis   . Diabetes mellitus   . DKA (diabetic ketoacidosis) ([facility]) 07/2014  . Malnutrition ([place])   . Noncompliance with medication regimen   . Thyroid disease     Patient Active Problem List   Diagnosis Date Noted  . Diabetes mellitus type 2, uncontrolled, with complications ([place])   . MDD (major depressive disorder), recurrent severe, without psychosis ([place]) 03/27/2016  . Recurrent major depressive disorder ([place])   . Abdominal pain   . Hyperglycemia 02/24/2016  . Volume depletion 12/21/2015  . Metabolic acidosis 89/38/1017  . Diabetes mellitus with nonketotic hyperosmolarity ([place]) 09/21/2014  . Noncompliance with medication regimen   . Diabetes mellitus ([place])   . DKA (diabetic ketoacidoses) ([place]) 07/24/2014  .  Dehydration 07/24/2014  . Acute renal failure ([place]) 07/24/2014  . Hyperkalemia 07/24/2014  . Hyponatremia 07/24/2014  . Hyperthyroidism 07/24/2014    History reviewed. No pertinent surgical history.     Home Medications    Prior to Admission medications   Medication Sig Start Date End Date Taking? Authorizing Provider  acetaminophen-codeine (TYLENOL #3) 300-30 MG tablet Take 1 tablet by mouth every 6 (six) hours as needed for moderate pain. Patient not taking: Reported on 03/25/2016 03/25/16   Dorena Dew, FNP  Blood Glucose Monitoring Suppl (TRUE METRIX AIR GLUCOSE METER) w/Device KIT 1 each by Does not apply route 4 (four) times daily -  before meals and at bedtime. 03/25/16   Dorena Dew, FNP  FLUoxetine (PROZAC) 20 MG capsule Take 1 capsule (20 mg total) by mouth daily. 03/25/16   Dorena Dew, FNP  gabapentin (NEURONTIN) 300 MG capsule Take 1 capsule (300 mg total) by mouth 3 (three) times daily. 03/25/16   Dorena Dew, FNP  glucose blood (TRUE METRIX BLOOD GLUCOSE TEST) test strip Use as instructed 03/25/16   Dorena Dew, FNP  insulin aspart (NOVOLOG) 100 unit/mL injection Per sliding scale 03/25/16   Dorena Dew, FNP  insulin glargine (LANTUS) 100 UNIT/ML injection Inject 0.1 mLs (10 Units total) into the skin at bedtime. 03/25/16   Dorena Dew, FNP  Insulin Syringes, Disposable, U-100 0.5 ML MISC 1 each by Does not apply route 4 (four) times daily -  before meals and at bedtime. Patient not taking: Reported on 03/25/2016 03/25/16   Dorena Dew, FNP    Family History Family History  Problem Relation Age of Onset  . Diabetes Mother   . Seizures Sister   . Diabetes Sister     Social History Social History  Substance Use Topics  . Smoking status: Current Every Day Smoker    Packs/day: 0.50    Years: 8.00  . Smokeless tobacco: Never Used  . Alcohol use No     Allergies   Patient has no known allergies.   Review of Systems Review of Systems    All other systems reviewed and are negative.    Physical Exam Updated Vital Signs BP 122/84   Pulse 71   Temp 98.1 F (36.7 C) (Oral)   Resp 18   Ht '5\' 9"'$  (1.753 m)   Wt 59 kg   SpO2 97%   BMI 19.20 kg/m   Physical Exam  Constitutional: He is oriented to person, place, and time. He appears well-developed and well-nourished.  HENT:  Head: Normocephalic and atraumatic.  Right Ear: External ear normal.  Left Ear: External ear normal.  Nose: Nose normal.  Mouth/Throat: Oropharynx is clear and moist.  Eyes: Conjunctivae and EOM are normal. Pupils are equal, round, and reactive to light.  Neck: Normal range of motion. Neck supple.  Cardiovascular: Normal rate, regular rhythm, normal heart sounds and intact distal pulses.   Pulmonary/Chest: Effort normal and breath sounds normal.  Abdominal: Soft. Bowel sounds are normal. There is tenderness.  Musculoskeletal: Normal range of motion.  Neurological: He is alert and oriented to person, place, and time. He has normal reflexes.  Skin: Skin is warm and dry.  Psychiatric: He has a normal mood and affect. His behavior is normal. Judgment and thought content normal.  Nursing note and vitals reviewed.    ED Treatments / Results  Labs (all labs ordered are listed, but only abnormal results are displayed) Labs Reviewed  GLUCOSE, CAPILLARY - Abnormal; Notable for the following:       Result Value   Glucose-Capillary 374 (*)    All other components within normal limits  GLUCOSE, CAPILLARY - Abnormal; Notable for the following:    Glucose-Capillary 370 (*)    All other components within normal limits  GLUCOSE, CAPILLARY - Abnormal; Notable for the following:    Glucose-Capillary 393 (*)    All other components within normal limits  GLUCOSE, CAPILLARY - Abnormal; Notable for the following:    Glucose-Capillary 479 (*)    All other components within normal limits  GLUCOSE, CAPILLARY - Abnormal; Notable for the following:     Glucose-Capillary 498 (*)    All other components within normal limits  GLUCOSE, CAPILLARY - Abnormal; Notable for the following:    Glucose-Capillary 350 (*)    All other components within normal limits  GLUCOSE, CAPILLARY - Abnormal; Notable for the following:    Glucose-Capillary 148 (*)    All other components within normal limits  GLUCOSE, CAPILLARY - Abnormal; Notable for the following:    Glucose-Capillary 373 (*)    All other components within normal limits  GLUCOSE, CAPILLARY - Abnormal; Notable for the following:    Glucose-Capillary 331 (*)    All other components within normal limits  GLUCOSE, CAPILLARY - Abnormal; Notable for the following:    Glucose-Capillary 385 (*)    All other components within normal limits  GLUCOSE, CAPILLARY - Abnormal; Notable for the following:  Glucose-Capillary 338 (*)    All other components within normal limits  GLUCOSE, CAPILLARY - Abnormal; Notable for the following:    Glucose-Capillary 498 (*)    All other components within normal limits  GLUCOSE, CAPILLARY - Abnormal; Notable for the following:    Glucose-Capillary 296 (*)    All other components within normal limits  GLUCOSE, CAPILLARY - Abnormal; Notable for the following:    Glucose-Capillary 406 (*)    All other components within normal limits  GLUCOSE, CAPILLARY - Abnormal; Notable for the following:    Glucose-Capillary 248 (*)    All other components within normal limits  GLUCOSE, CAPILLARY - Abnormal; Notable for the following:    Glucose-Capillary 437 (*)    All other components within normal limits  GLUCOSE, CAPILLARY - Abnormal; Notable for the following:    Glucose-Capillary 297 (*)    All other components within normal limits  GLUCOSE, CAPILLARY - Abnormal; Notable for the following:    Glucose-Capillary 562 (*)    All other components within normal limits  GLUCOSE, CAPILLARY - Abnormal; Notable for the following:    Glucose-Capillary 563 (*)    All other  components within normal limits  GLUCOSE, CAPILLARY - Abnormal; Notable for the following:    Glucose-Capillary 515 (*)    All other components within normal limits  GLUCOSE, CAPILLARY - Abnormal; Notable for the following:    Glucose-Capillary 396 (*)    All other components within normal limits  GLUCOSE, CAPILLARY - Abnormal; Notable for the following:    Glucose-Capillary 348 (*)    All other components within normal limits  GLUCOSE, CAPILLARY - Abnormal; Notable for the following:    Glucose-Capillary 272 (*)    All other components within normal limits  BASIC METABOLIC PANEL - Abnormal; Notable for the following:    Sodium 132 (*)    Chloride 98 (*)    Glucose, Bld 437 (*)    BUN 22 (*)    All other components within normal limits  MAGNESIUM - Abnormal; Notable for the following:    Magnesium 1.6 (*)    All other components within normal limits  HEMOGLOBIN A1C - Abnormal; Notable for the following:    Hgb A1c MFr Bld 12.2 (*)    All other components within normal limits  URINALYSIS, ROUTINE W REFLEX MICROSCOPIC - Abnormal; Notable for the following:    Color, Urine STRAW (*)    Glucose, UA >=500 (*)    Squamous Epithelial / LPF 0-5 (*)    All other components within normal limits  GLUCOSE, CAPILLARY - Abnormal; Notable for the following:    Glucose-Capillary 291 (*)    All other components within normal limits  GLUCOSE, CAPILLARY - Abnormal; Notable for the following:    Glucose-Capillary 221 (*)    All other components within normal limits  GLUCOSE, CAPILLARY - Abnormal; Notable for the following:    Glucose-Capillary 370 (*)    All other components within normal limits  GLUCOSE, CAPILLARY - Abnormal; Notable for the following:    Glucose-Capillary 394 (*)    All other components within normal limits  GLUCOSE, CAPILLARY - Abnormal; Notable for the following:    Glucose-Capillary 476 (*)    All other components within normal limits  GLUCOSE, CAPILLARY - Abnormal;  Notable for the following:    Glucose-Capillary 323 (*)    All other components within normal limits  GLUCOSE, CAPILLARY - Abnormal; Notable for the following:    Glucose-Capillary 236 (*)  All other components within normal limits  GLUCOSE, CAPILLARY - Abnormal; Notable for the following:    Glucose-Capillary 220 (*)    All other components within normal limits  GLUCOSE, CAPILLARY - Abnormal; Notable for the following:    Glucose-Capillary 401 (*)    All other components within normal limits  GLUCOSE, CAPILLARY - Abnormal; Notable for the following:    Glucose-Capillary 140 (*)    All other components within normal limits  GLUCOSE, CAPILLARY - Abnormal; Notable for the following:    Glucose-Capillary 252 (*)    All other components within normal limits  GLUCOSE, CAPILLARY - Abnormal; Notable for the following:    Glucose-Capillary 282 (*)    All other components within normal limits  GLUCOSE, CAPILLARY - Abnormal; Notable for the following:    Glucose-Capillary 443 (*)    All other components within normal limits  GLUCOSE, CAPILLARY - Abnormal; Notable for the following:    Glucose-Capillary 344 (*)    All other components within normal limits  GLUCOSE, CAPILLARY - Abnormal; Notable for the following:    Glucose-Capillary 194 (*)    All other components within normal limits  GLUCOSE, CAPILLARY - Abnormal; Notable for the following:    Glucose-Capillary 570 (*)    All other components within normal limits  GLUCOSE, CAPILLARY - Abnormal; Notable for the following:    Glucose-Capillary 551 (*)    All other components within normal limits  GLUCOSE, CAPILLARY - Abnormal; Notable for the following:    Glucose-Capillary 464 (*)    All other components within normal limits  GLUCOSE, CAPILLARY - Abnormal; Notable for the following:    Glucose-Capillary 234 (*)    All other components within normal limits  GLUCOSE, CAPILLARY - Abnormal; Notable for the following:     Glucose-Capillary 228 (*)    All other components within normal limits  GLUCOSE, CAPILLARY - Abnormal; Notable for the following:    Glucose-Capillary 382 (*)    All other components within normal limits  GLUCOSE, CAPILLARY - Abnormal; Notable for the following:    Glucose-Capillary 303 (*)    All other components within normal limits  CBC - Abnormal; Notable for the following:    RBC 3.72 (*)    Hemoglobin 11.7 (*)    HCT 34.2 (*)    All other components within normal limits  COMPREHENSIVE METABOLIC PANEL - Abnormal; Notable for the following:    Chloride 99 (*)    Glucose, Bld 356 (*)    BUN 23 (*)    Total Protein 5.9 (*)    Albumin 3.4 (*)    AST 68 (*)    ALT 67 (*)    All other components within normal limits  CBG MONITORING, ED - Abnormal; Notable for the following:    Glucose-Capillary 318 (*)    All other components within normal limits  I-STAT VENOUS BLOOD GAS, ED - Abnormal; Notable for the following:    pCO2, Ven 43.8 (*)    pO2, Ven 76.0 (*)    All other components within normal limits  PROLACTIN  TSH  VITAMIN B12  LIPASE, BLOOD  BLOOD GAS, VENOUS  I-STAT TROPOININ, ED  CBG MONITORING, ED    EKG  EKG Interpretation  Date/Time:  Sunday April 04 2016 21:23:16 EST Ventricular Rate:  74 PR Interval:    QRS Duration: 69 QT Interval:  406 QTC Calculation: 451 R Axis:   113 Text Interpretation:  Sinus rhythm Anterolateral infarct, old twave inverted in v2 new  since last tracing Confirmed by Kayse Puccini MD, Duwayne Heck [phone]) on 04/04/2016 9:43:55 PM       Radiology Dg Chest Portable 1 View  Result Date: 04/04/2016 CLINICAL DATA:  Epigastric and chest pain. EXAM: PORTABLE CHEST 1 VIEW COMPARISON:  02/23/2016 FINDINGS: The cardiomediastinal contours are normal. The lungs are clear. Pulmonary vasculature is normal. No consolidation, pleural effusion, or pneumothorax. No acute osseous abnormalities are seen. IMPRESSION: No active disease. Electronically Signed   By:  Rubye Oaks M.D.   On: 04/04/2016 22:21   Ct Angio Chest/abd/pel For Dissection W And/or Wo Contrast  Result Date: 04/04/2016 CLINICAL DATA:  Chest pain radiating to the back. EXAM: CT ANGIOGRAPHY CHEST, ABDOMEN AND PELVIS TECHNIQUE: Multidetector CT imaging through the chest, abdomen and pelvis was performed using the standard protocol during bolus administration of intravenous contrast. Multiplanar reconstructed images and MIPs were obtained and reviewed to evaluate the vascular anatomy. CONTRAST:  100 cc Isovue 370 IV COMPARISON:  Abdominal CT 02/29/2016 FINDINGS: CTA CHEST FINDINGS Cardiovascular: Preferential opacification of the thoracic aorta. No evidence of thoracic aortic aneurysm or dissection. Normal heart size. No pericardial effusion. No central pulmonary embolus. Mediastinum/Nodes: No mediastinal or hilar adenopathy. Small hiatal hernia. Visualized thyroid gland is normal. Lungs/Pleura: Previous tree in bud opacities in the left lower lobe have resolved. No new focal airspace disease. Minimal lower lobe bronchial thickening. No pleural effusion. Musculoskeletal: Mild degenerative change in the midthoracic spine. There are no acute or suspicious osseous abnormalities. Review of the MIP images confirms the above findings. CTA ABDOMEN AND PELVIS FINDINGS VASCULAR Aorta: Normal caliber aorta without aneurysm, dissection, vasculitis or significant stenosis. Minimal atherosclerosis. 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 No dissection. SMA: Patent without evidence of aneurysm, dissection, vasculitis or significant stenosis. Renals: Both renal arteries are patent without evidence of aneurysm, dissection, vasculitis, fibromuscular dysplasia or significant stenosis. IMA: Patent without evidence of aneurysm, dissection, vasculitis or significant stenosis. Inflow: Patent without evidence of aneurysm, dissection, vasculitis or significant stenosis. Veins:  No obvious venous abnormality within the limitations of this arterial phase study. There is a retroaortic left renal vein, better appreciated on prior exam. Review of the MIP images confirms the above findings. NON-VASCULAR Hepatobiliary: No focal hepatic abnormality on arterial phase imaging. Gallbladder is minimally distended. No calcified gallstone. No biliary dilatation. Pancreas: No ductal dilatation or inflammation. Spleen: Normal arterial phase enhancement.  Normal in size. Adrenals/Urinary Tract: No adrenal nodule. No hydronephrosis or perinephric edema. Extrarenal pelvis on the left again seen. Tiny cortical cyst in the lower right kidney. Urinary bladder is distended. No wall thickening. Stomach/Bowel: Stomach contains ingested contents. No bowel wall thickening, inflammation or obstruction. Suboptimal bowel evaluation given arterial phase imaging and lack of enteric contrast. Moderate to large stool burden. Appendix is not confidently visualized. Lymphatic: No evidence of adenopathy. Reproductive: Prostate is unremarkable. Other: Minimal fluid in the right lower quadrant is unchanged from prior exam. No ascites or free air. Mild induration of subcutaneous fat is nonspecific. Previous air in the anterior abdominal wall is no longer seen. Musculoskeletal: Stable degenerative change in the spine. Subchondral cystic change in both femoral heads. There are no acute or suspicious osseous abnormalities. Review of the MIP images confirms the above findings. IMPRESSION: 1. No aortic dissection or acute vascular abnormality. 2. Moderate narrowing of the celiac artery origin may be a normal variant, however can be seen in the setting of median arcuate ligament syndrome, and typically diagnosis of exclusion for abdominal pain. 3.  Mild lower lobe bronchial thickening can be seen with bronchitis. Previous left lower lobe tree in bud opacities on abdominal CT have resolved. 4. No acute abnormality in the abdomen or  pelvis. Electronically Signed   By: Jeb Levering M.D.   On: 04/04/2016 23:50    Procedures Procedures (including critical care time)  Medications Ordered in ED Medications  acetaminophen (TYLENOL) tablet 650 mg (650 mg Oral Given 04/04/16 0857)  alum & mag hydroxide-simeth (MAALOX/MYLANTA) 200-200-20 MG/5ML suspension 30 mL (30 mLs Oral Given 04/04/16 1635)  hydrOXYzine (ATARAX/VISTARIL) tablet 25 mg (25 mg Oral Given 04/01/16 2206)  magnesium hydroxide (MILK OF MAGNESIA) suspension 30 mL (not administered)  FLUoxetine (PROZAC) capsule 20 mg (20 mg Oral Given 04/04/16 0856)  meloxicam (MOBIC) tablet 15 mg (15 mg Oral Given 04/04/16 0856)  doxepin (SINEQUAN) capsule 25 mg (25 mg Oral Given 04/03/16 2258)  gabapentin (NEURONTIN) capsule 400 mg (400 mg Oral Given 04/04/16 1821)  insulin aspart (novoLOG) injection 0-15 Units (15 Units Subcutaneous Given 04/04/16 1730)  insulin aspart (novoLOG) injection 0-5 Units (2 Units Subcutaneous Given 04/03/16 2142)  insulin glargine (LANTUS) injection 20 Units (20 Units Subcutaneous Given 04/04/16 2239)  insulin aspart (novoLOG) injection 5 Units (5 Units Subcutaneous Given 04/04/16 1731)  feeding supplement (GLUCERNA SHAKE) (GLUCERNA SHAKE) liquid 237 mL (237 mLs Oral Not Given 04/04/16 1634)  insulin aspart (novoLOG) injection 10 Units (10 Units Subcutaneous Given 03/27/16 1738)  insulin aspart (novoLOG) injection 10 Units (10 Units Subcutaneous Given 03/27/16 1839)  potassium chloride SA (K-DUR,KLOR-CON) CR tablet 40 mEq (40 mEq Oral Given 03/31/16 0828)  insulin aspart (novoLOG) injection 10 Units (10 Units Subcutaneous Given 03/30/16 2156)  insulin NPH Human (HUMULIN N,NOVOLIN N) injection 15 Units (15 Units Subcutaneous Given 04/01/16 0821)  insulin aspart (novoLOG) injection 5 Units (5 Units Subcutaneous Given 04/02/16 1727)  insulin aspart (novoLOG) injection 2 Units (2 Units Subcutaneous Given 04/02/16 2359)  insulin aspart (novoLOG) injection 15 Units (15  Units Subcutaneous Given 04/03/16 1218)  insulin aspart (novoLOG) injection 10 Units (10 Units Subcutaneous Given 04/03/16 1447)  sodium chloride 0.9 % bolus 500 mL (500 mLs Intravenous New Bag/Given 04/04/16 2238)  morphine 4 MG/ML injection 2 mg (2 mg Intravenous Given 04/04/16 2233)  iopamidol (ISOVUE-370) 76 % injection (100 mLs  Contrast Given 04/04/16 2316)     Initial Impression / Assessment and Plan / ED Course  I have reviewed the triage vital signs and the nursing notes.  Pertinent labs & imaging results that were available during my care of the patient were reviewed by me and considered in my medical decision making (see chart for details).  Clinical Course     Patient hemodynamically stable here. Blood sugar elevated- iv fluids given and insulin given.  NO evidence of dka with normal anion gap and ph. Recheck bs-  Chest upper abdominal pain- doubt acs as patient with no acute ekg changes, pain for one week and normal troponin. Ct angio chest and abdomen- no dissection.  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   Discussed with Dr. Dina Rich and will reassess patient after [place].    Final Clinical Impressions(s) / ED Diagnoses   Final diagnoses:  Epigastric pain  Hyperglycemia  Elevated LFTs    New Prescriptions New Prescriptions   No medications on file     Pattricia Boss, MD 04/05/16 [place]  Zethan Alfieri, MD 04/05/16 1031

## 2016-04-05 NOTE — Progress Notes (Signed)
Recreation Therapy Notes  Date: 04/05/16 Time: 0930 Location: [address] Dayroom  Group Topic: Stress Management  Goal Area(s) Addresses:  Patient will verbalize importance of using healthy stress management.  Patient will identify positive emotions associated with healthy stress management.   Intervention: Stress Management  Activity :  Peaceful [place]aves Guided Imagery.  LRT introduced the stress management concept of guided imagery.  LRT read [name] script to allow patients the opportunity engage and participate in the activity.  Patients were to follow along as LRT read script to participate in the activity.  Education:  Stress Management, Discharge Planning.   Education Outcome: Acknowledges edcuation/In group clarification offered/Needs additional education  Clinical Observations/Feedback: Pt did not attend group.   Kenneth [name], LRT/CTRS         Kenneth [name] 04/05/2016 12:17 PM 

## 2016-04-05 NOTE — ED Provider Notes (Signed)
Patient signed out by Dr. Rosalia Hammersay. Ultrasound reassuring. He does have one gallbladder polyp. Repeat blood glucose 280. This is consistent with his prior range. On recheck, he continues to complain of abdominal pain. Discussed with him continuing Tylenol and adding omeprazole. Would recommend 40 mg omeprazole daily. Feel at this time [facility] is [facility] and is medically clear.   Shon Batonourtney F Horton, MD 04/05/16 [phone]

## 2016-04-06 LAB — GLUCOSE, CAPILLARY: GLUCOSE-CAPILLARY: 171 mg/dL — AB (ref 65–99)

## 2016-04-06 MED ORDER — INSULIN GLARGINE 100 UNIT/ML ~~LOC~~ SOLN
30.0000 [IU] | Freq: Every day | SUBCUTANEOUS | Status: DC
  Filled 2016-04-06 (×2): qty 0.3

## 2016-04-06 MED ORDER — INSULIN ASPART 100 UNIT/ML ~~LOC~~ SOLN: 10.0000 [IU] | Freq: Three times a day (TID) | SUBCUTANEOUS | Status: DC

## 2016-04-06 MED ORDER — INSULIN GLARGINE 100 UNIT/ML ~~LOC~~ SOLN: 30.0000 [IU] | Freq: Every day | SUBCUTANEOUS | 11 refills | Status: DC

## 2016-04-06 MED FILL — hydrOXYzine HCL 25 MG TABS: 25 | 10 days supply | Qty: 30 | Fill #0

## 2016-04-06 MED FILL — ?FLUOXETINE HCL 20MG TABLET: 20 | 30 days supply | Qty: 30 | Fill #0

## 2016-04-06 MED FILL — MELOXICAM 15 MG TABLET: 15 | 10 days supply | Qty: 10 | Fill #0

## 2016-04-06 MED FILL — DOXEPIN 25 MG CAPSULE: 25 | 30 days supply | Qty: 30 | Fill #0

## 2016-04-06 MED FILL — ?PANTOPRAZOLE SOD DR 40MG: 40 MG | 30 days supply | Qty: 30 | Fill #0

## 2016-04-06 MED FILL — GABAPENTIN 400 MG CAPSULE: 400 | 30 days supply | Qty: 90 | Fill #0

## 2016-04-06 MED FILL — ACETAMINOPHEN/COD #3 TABLET: 300-30 | 8 days supply | Qty: 30 | Fill #0

## 2016-04-06 MED FILL — !LANTUS 100 UNITS/ML VIAL: 100 | 33 days supply | Qty: 10 | Fill #0

## 2016-04-06 NOTE — Tx Team (Signed)
Interdisciplinary Treatment and Diagnostic Plan Update  04/06/2016 Time of Session: 9:30AM Kenneth [name] MRN: [id number]  Principal Diagnosis: MDD (major depressive disorder), recurrent severe, without psychosis (HCC)  Secondary Diagnoses: Principal Problem:   MDD (major depressive disorder), recurrent severe, without psychosis (HCC) Active Problems:   Diabetes mellitus (HCC)   Diabetes mellitus type 2, uncontrolled, with complications (HCC)   Current Medications:  Current Facility-Administered Medications  Medication Dose Route Frequency Provider Last Rate Last Dose  . acetaminophen (TYLENOL) tablet 650 mg  650 mg Oral Q6H PRN Truman Hayward, FNP   650 mg at 04/05/16 1937  . alum & mag hydroxide-simeth (MAALOX/MYLANTA) 200-200-20 MG/5ML suspension 30 mL  30 mL Oral Q4H PRN Truman Hayward, FNP   30 mL at 04/04/16 1635  . doxepin (SINEQUAN) capsule 25 mg  25 mg Oral QHS PRN Craige Cotta, MD   25 mg at 04/05/16 2106  . feeding supplement (GLUCERNA SHAKE) (GLUCERNA SHAKE) liquid 237 mL  237 mL Oral BID BM Renae Fickle, MD   237 mL at 04/05/16 1400  . FLUoxetine (PROZAC) capsule 20 mg  20 mg Oral Daily Truman Hayward, FNP   20 mg at 04/05/16 5654  . gabapentin (NEURONTIN) capsule 400 mg  400 mg Oral TID PC & HS Sanjuana Kava, NP   400 mg at 04/05/16 2106  . hydrOXYzine (ATARAX/VISTARIL) tablet 25 mg  25 mg Oral TID PRN Truman Hayward, FNP   25 mg at 04/01/16 2206  . insulin aspart (novoLOG) injection 0-15 Units  0-15 Units Subcutaneous TID WC Renae Fickle, MD   15 Units at 04/05/16 1720  . insulin aspart (novoLOG) injection 0-5 Units  0-5 Units Subcutaneous QHS Renae Fickle, MD   2 Units at 04/05/16 2103  . insulin aspart (novoLOG) injection 10 Units  10 Units Subcutaneous TID WC Renae Fickle, MD      . insulin glargine (LANTUS) injection 30 Units  30 Units Subcutaneous QHS Renae Fickle, MD      . magnesium hydroxide (MILK OF MAGNESIA) suspension 30 mL  30 mL Oral  Daily PRN Truman Hayward, FNP      . meloxicam (MOBIC) tablet 15 mg  15 mg Oral Daily Sanjuana Kava, NP   15 mg at 04/05/16 0837  . pantoprazole (PROTONIX) EC tablet 40 mg  40 mg Oral Daily Jackelyn Poling, NP   40 mg at 04/05/16 3394   PTA Medications: Prescriptions Prior to Admission  Medication Sig Dispense Refill Last Dose  . acetaminophen-codeine (TYLENOL #3) 300-30 MG tablet Take 1 tablet by mouth every 6 (six) hours as needed for moderate pain. (Patient not taking: Reported on 03/25/2016) 30 tablet 0 Not Taking at Unknown time  . Blood Glucose Monitoring Suppl (TRUE METRIX AIR GLUCOSE METER) w/Device KIT 1 each by Does not apply route 4 (four) times daily -  before meals and at bedtime. 1 kit 0   . FLUoxetine (PROZAC) 20 MG capsule Take 1 capsule (20 mg total) by mouth daily. 30 capsule 2 03/25/2016 at Unknown time  . gabapentin (NEURONTIN) 300 MG capsule Take 1 capsule (300 mg total) by mouth 3 (three) times daily. 90 capsule 2 03/25/2016 at Unknown time  . glucose blood (TRUE METRIX BLOOD GLUCOSE TEST) test strip Use as instructed 100 each 12   . insulin aspart (NOVOLOG) 100 unit/mL injection Per sliding scale 1 vial 12   . Insulin Syringes, Disposable, U-100 0.5 ML MISC 1 each by Does not apply  route 4 (four) times daily -  before meals and at bedtime. (Patient not taking: Reported on 03/25/2016) 100 each 5 Not Taking at Unknown time  . [DISCONTINUED] insulin glargine (LANTUS) 100 UNIT/ML injection Inject 0.1 mLs (10 Units total) into the skin at bedtime. 10 mL 11     Patient Stressors: Financial difficulties Health problems Substance abuse  Patient Strengths: Ability for Estate manager/land agent for treatment/growth Supportive family/friends  Treatment Modalities: Medication Management, Group therapy, Case management,  1 to 1 session with clinician, Psychoeducation, Recreational therapy.   Physician Treatment Plan for Primary Diagnosis: MDD (major depressive disorder),  recurrent severe, without psychosis ([place]) Long Term Goal(s): Improvement in symptoms so as ready for discharge Improvement in symptoms so as ready for discharge   Short Term Goals: Ability to identify changes in lifestyle to reduce recurrence of condition will improve Ability to verbalize feelings will improve Ability to disclose and discuss suicidal ideas Ability to demonstrate self-control will improve Ability to identify and develop effective coping behaviors will improve Ability to maintain clinical measurements within normal limits will improve Compliance with prescribed medications will improve Ability to identify triggers associated with substance abuse/mental health issues will improve Ability to identify changes in lifestyle to reduce recurrence of condition will improve Ability to verbalize feelings will improve Ability to disclose and discuss suicidal ideas Ability to demonstrate self-control will improve Ability to identify and develop effective coping behaviors will improve Ability to maintain clinical measurements within normal limits will improve Compliance with prescribed medications will improve Ability to identify triggers associated with substance abuse/mental health issues will improve  Medication Management: Evaluate patient's response, side effects, and tolerance of medication regimen.  Therapeutic Interventions: 1 to 1 sessions, Unit Group sessions and Medication administration.  Evaluation of Outcomes: Met  Physician Treatment Plan for Secondary Diagnosis: Principal Problem:   MDD (major depressive disorder), recurrent severe, without psychosis ([place]) Active Problems:   Diabetes mellitus ([place])   Diabetes mellitus type 2, uncontrolled, with complications ([place])  Long Term Goal(s): Improvement in symptoms so as ready for discharge Improvement in symptoms so as ready for discharge   Short Term Goals: Ability to identify changes in lifestyle to reduce recurrence  of condition will improve Ability to verbalize feelings will improve Ability to disclose and discuss suicidal ideas Ability to demonstrate self-control will improve Ability to identify and develop effective coping behaviors will improve Ability to maintain clinical measurements within normal limits will improve Compliance with prescribed medications will improve Ability to identify triggers associated with substance abuse/mental health issues will improve Ability to identify changes in lifestyle to reduce recurrence of condition will improve Ability to verbalize feelings will improve Ability to disclose and discuss suicidal ideas Ability to demonstrate self-control will improve Ability to identify and develop effective coping behaviors will improve Ability to maintain clinical measurements within normal limits will improve Compliance with prescribed medications will improve Ability to identify triggers associated with substance abuse/mental health issues will improve     Medication Management: Evaluate patient's response, side effects, and tolerance of medication regimen.  Therapeutic Interventions: 1 to 1 sessions, Unit Group sessions and Medication administration.  Evaluation of Outcomes: Met   RN Treatment Plan for Primary Diagnosis: MDD (major depressive disorder), recurrent severe, without psychosis ([place]) Long Term Goal(s): Knowledge of disease and therapeutic regimen to maintain health will improve  Short Term Goals: Ability to remain free from injury will improve, Ability to disclose and discuss suicidal ideas and Ability to identify and develop effective  coping behaviors will improve  Medication Management: RN will administer medications as ordered by provider, will assess and evaluate patient's response and provide education to patient for prescribed medication. RN will report any adverse and/or side effects to prescribing provider.  Therapeutic Interventions: 1 on 1  counseling sessions, Psychoeducation, Medication administration, Evaluate responses to treatment, Monitor vital signs and CBGs as ordered, Perform/monitor CIWA, COWS, AIMS and Fall Risk screenings as ordered, Perform wound care treatments as ordered.  Evaluation of Outcomes: Met   LCSW Treatment Plan for Primary Diagnosis: MDD (major depressive disorder), recurrent severe, without psychosis ([place]) Long Term Goal(s): Safe transition to appropriate next level of care at discharge, Engage patient in therapeutic group addressing interpersonal concerns.  Short Term Goals: Engage patient in aftercare planning with referrals and resources, Facilitate patient progression through stages of change regarding substance use diagnoses and concerns and Identify triggers associated with mental health/substance abuse issues  Therapeutic Interventions: Assess for all discharge needs, 1 to 1 time with Social worker, Explore available resources and support systems, Assess for adequacy in community support network, Educate family and significant other(s) on suicide prevention, Complete Psychosocial Assessment, Interpersonal group therapy.  Evaluation of Outcomes: Met   Progress in Treatment: Attending groups: Yes. Participating in groups: Yes. Taking medication as prescribed: Yes. Toleration medication: Yes. Family/Significant other contact made: SPE completed with pt; pt declined to consent to family contact.  Patient understands diagnosis: Yes. Discussing patient identified problems/goals with staff: Yes. Medical problems stabilized or resolved: Yes. Denies suicidal/homicidal ideation: Yes. Issues/concerns per patient self-inventory: No. Other: n/a  New problem(s) identified: No, Describe:  n/a  New Short Term/Long Term Goal(s): medication management; detox; development of comprehensive mental wellness/sobriety plan.   Discharge Plan or Barriers: Pt has follow-up appt at [facility] and plans to  res[facility] with [facility] (per patient, they come 2-3 times weekly). Patient provided with food pantry information and letter with diagnosis per his request.   Reason for Continuation of Hospitalization: none  Estimated Length of Stay:  D/c today   Attendees: Patient: 04/06/2016 8:23 AM  Physician: Larene Beach MD; Dr. Shea Evans MD 04/06/2016 8:23 AM  Nursing: Dani Gobble RN 04/06/2016 8:23 AM  RN Care Manager: Lars Pinks CM 04/06/2016 8:23 AM  Social Worker: Nira Conn Smart, LCSW; Adriana Reams LCSW 04/06/2016 8:23 AM  Recreational Therapist:  04/06/2016 8:23 AM  Other:  Ricky Ala NP; May Augustin NP 04/06/2016 8:23 AM  Other:  04/06/2016 8:23 AM  Other: 04/06/2016 8:23 AM    Scribe for Treatment Team: [place], LCSW 04/06/2016 8:23 AM

## 2016-04-06 NOTE — Progress Notes (Signed)
Discharge note:  Patient discharged home per MD order.  [facility].  Patient received prescriptions and medications were reviewed.  Reviewed AVS./transition record with patient and he indicated understanding.  He denies any thoughts of self harm.  Patient left ambulatory with his sister.

## 2016-04-06 NOTE — Progress Notes (Signed)
  [facility] Case Management Discharge Plan :  Will you be returning to the same living situation after discharge:  Yes,  home At discharge, do you have transportation home?: Yes,  pt's sister coming at 9am Do you have the ability to pay for your medications: Yes,  mental health  Release of information consent forms completed and submitted to medical records by CSW.  Patient to Follow up at: Follow-up Information    [facility] Follow up on 04/08/2016.   Why:  Appt for hospital follow-up on this date at 9:00AM. Please bring picture ID/social security card, any proof of income if you have it. Thank you.  Contact information: [address] [place] [id number] [phone]           Next level of care provider has access to [facility] Link:no  Safety Planning and Suicide Prevention discussed: Yes,  SPE completed with pt; pt declined to consent to family contact.  Have you used any form of tobacco in the last 30 days? (Cigarettes, Smokeless Tobacco, Cigars, and/or Pipes): No  Has patient been referred to the Quitline?: N/A patient is not a smoker  Patient has been referred for addiction treatment: Yes  Ledell PeoplesHeather N Smart LCSW 04/06/2016, 8:26 AM

## 2016-04-06 NOTE — Progress Notes (Signed)
Patient was hyperglycemic throughout the day yesterday but had been awake overnight at the ER and may not have eaten regularly.  Morning CBG trending down.  Gradual titration of insulin because of risk of hypoglycemia.  Patient is brittle diabetic and afraid of hypoglycemia.  He has longstanding uncontrolled diabetes with A1c of 13 with peripheral neuropathy.   -  Increase lantus to 30 units -  Increase standing aspart to 10 units -  Continue moderate dose SSI  If discharging today, would recommend using the insulin regimen above at discharge with close outpatient follow up with PCP.  Patient has limited resources but can get glargine and aspart from [facility] at discharge for free.

## 2016-04-26 ENCOUNTER — Encounter: Payer: Self-pay | Admitting: Family Medicine

## 2016-04-26 ENCOUNTER — Ambulatory Visit (INDEPENDENT_AMBULATORY_CARE_PROVIDER_SITE_OTHER): Payer: Medicaid Other | Admitting: Family Medicine

## 2016-04-26 VITALS — BP 131/81 | HR 83 | Temp 98.1°F | Resp 18 | Ht 69.0 in | Wt 130.6 lb

## 2016-04-26 DIAGNOSIS — R52 Pain, unspecified: Secondary | ICD-10-CM

## 2016-04-26 DIAGNOSIS — Z794 Long term (current) use of insulin: Secondary | ICD-10-CM

## 2016-04-26 DIAGNOSIS — E118 Type 2 diabetes mellitus with unspecified complications: Secondary | ICD-10-CM

## 2016-04-26 DIAGNOSIS — F339 Major depressive disorder, recurrent, unspecified: Secondary | ICD-10-CM | POA: Diagnosis not present

## 2016-04-26 DIAGNOSIS — G8929 Other chronic pain: Secondary | ICD-10-CM | POA: Insufficient documentation

## 2016-04-26 DIAGNOSIS — IMO0002 Reserved for concepts with insufficient information to code with codable children: Secondary | ICD-10-CM

## 2016-04-26 DIAGNOSIS — E1165 Type 2 diabetes mellitus with hyperglycemia: Secondary | ICD-10-CM

## 2016-04-26 DIAGNOSIS — G629 Polyneuropathy, unspecified: Secondary | ICD-10-CM | POA: Insufficient documentation

## 2016-04-26 LAB — GLUCOSE, CAPILLARY: GLUCOSE-CAPILLARY: 332 mg/dL — AB (ref 65–99)

## 2016-04-26 MED ORDER — INSULIN PEN NEEDLE 29G X 12.7MM MISC: 1.0000 | Freq: Four times a day (QID) | 5 refills | Status: DC

## 2016-04-26 MED ORDER — GABAPENTIN 400 MG PO CAPS: 400.0000 mg | ORAL_CAPSULE | Freq: Four times a day (QID) | ORAL | 0 refills | Status: DC

## 2016-04-26 MED ORDER — INSULIN GLARGINE 100 UNIT/ML SOLOSTAR PEN: 35.0000 [IU] | PEN_INJECTOR | Freq: Every day | SUBCUTANEOUS | 11 refills | Status: DC

## 2016-04-26 MED ORDER — INSULIN ASPART 100 UNIT/ML FLEXPEN: 8.0000 [IU] | PEN_INJECTOR | Freq: Three times a day (TID) | SUBCUTANEOUS | 11 refills | Status: DC

## 2016-04-26 MED ORDER — KETOROLAC TROMETHAMINE 60 MG/2ML IM SOLN
60.0000 mg | Freq: Once | INTRAMUSCULAR | Status: AC
  Administered 2016-04-26: 60 mg via INTRAMUSCULAR

## 2016-04-26 MED ORDER — MELOXICAM 15 MG PO TABS: 15.0000 mg | ORAL_TABLET | Freq: Every day | ORAL | 2 refills | Status: DC

## 2016-04-26 MED FILL — !NOVOLOG 100UNITS/ML VIAL: 100/ML | 28 days supply | Qty: 20 | Fill #0

## 2016-04-26 MED FILL — !LANTUS 100 UNITS/ML VIAL: 100 | 28 days supply | Qty: 10 | Fill #0

## 2016-04-26 NOTE — Progress Notes (Signed)
Subjective:    Patient ID: Kenneth [name], male    DOB: 01/27/1966, 51 y.o.   MRN: [id number]  HPI Kenneth [name], a 51 year old male with a history of uncontrolled type 2 diabetes mellitus, depression, weight loss and thyroid disease presents accompanied by his sister, Kenneth [name] for follow up. He is complaining of generalized burning pain. Pain is unrelieved by Tylenol #3 and Gabapentin at current dosage. He says that pain is 10/10 and interfering with sleeping. He is requesting additional medications for pain.  [facility]. He has a follow up psych eval scheduled with [facility] [place].   Kenneth [name] was admitted previously with suicidal ideations. He says that he is taking all medications consistently. Kenneth [name] had a CBG of 327 on arrival. He says that he took Lantus this am instead of last night.   Symptoms: paresthesia of the feet, polydipsia, polyuria and weight loss. Symptoms have gradually worsened. Patient denies visual disturbances and vomitting.  Evaluation to date has been included: hemoglobin A1C which was 12.2 on 03/31/2016. He says that he was only given 10 days of medications at the pharmacy.  Patient is complaining of depression.  He complains of depressed mood, difficulty concentrating, fatigue, feelings of worthlessness/guilt, hopelessness and suicidal thoughts without plan. Onset was approximately several months ago, clinical course has worsened since that time.   He endorses suicidal thoughts and ideations, but does not have a plan.     Risk factors: previous episode of depression Previous treatment includes Prozac 20 mg daily.   Past Medical History:  Diagnosis Date  . Arthritis   . Diabetes mellitus   . DKA (diabetic ketoacidosis) (HCC) 07/2014  . Malnutrition (HCC)   . Noncompliance with medication regimen   . Thyroid disease    Social History   Social History  . Marital status: Divorced    Spouse name: N/A  . Number of  children: N/A  . Years of education: N/A   Occupational History  . Not on file.   Social History Main Topics  . Smoking status: Current Every Day Smoker    Packs/day: 0.50    Years: 8.00  . Smokeless tobacco: Never Used  . Alcohol use No  . Drug use: No  . Sexual activity: Not on file   Other Topics Concern  . Not on file   Social History Narrative  . No narrative on file   Immunization History  Administered Date(s) Administered  . Pneumococcal Polysaccharide-23 07/25/2014   Review of Systems  Constitutional: Positive for appetite change, fatigue and unexpected weight change (Weight loss).  HENT: Negative.   Eyes: Negative for photophobia and visual disturbance.  Respiratory: Negative.   Cardiovascular: Positive for chest pain.  Gastrointestinal: Positive for abdominal pain (right upper quadrant) and nausea.  Endocrine: Negative for polydipsia, polyphagia and polyuria.  Musculoskeletal: Positive for back pain and myalgias.  Skin: Negative.   Neurological: Positive for weakness and numbness. Negative for dizziness.  Hematological: Negative.   Psychiatric/Behavioral: Positive for suicidal ideas.       Objective:   Physical Exam  Constitutional: He appears cachectic. He is not intubated.  HENT:  Mouth/Throat: Abnormal dentition. Dental caries present.  Cardiovascular: Regular rhythm.   Pulmonary/Chest: No apnea, no tachypnea and no bradypnea. He is not intubated.  Abdominal: He exhibits no distension. There is tenderness in the right upper quadrant. There is guarding. There is no rigidity, no tenderness at McBurney's point and  negative Murphy's sign.  Lymphadenopathy:       Head (right side): Tonsillar adenopathy present.       Head (left side): Tonsillar adenopathy present.  Neurological: He is alert.  Psychiatric: His mood appears anxious. His affect is blunt. He expresses suicidal ideation.      BP 131/81 (BP Location: Left Arm, Patient Position: Sitting, Cuff  Size: Normal)   Pulse 83   Temp 98.1 F (36.7 C)   Resp 18   Ht 5\' 9"  (1.753 m)   Wt 130 lb 9.6 oz (59.2 kg)   SpO2 100%   BMI 19.29 kg/m  Assessment & Plan:  1. Uncontrolled type 2 diabetes mellitus with complication, with long-term current use of insulin (HCC) CBG is 327, patient endorses taking Lantus this am. Will not treat hyperglycemia at this time. Will send insulin pens to pharmacy to improve compliance.  - Insulin Glargine (LANTUS) 100 UNIT/ML Solostar Pen; Inject 35 Units into the skin daily at 10 pm.  Dispense: 15 mL; Refill: 11 - Insulin Pen Needle (BD ULTRA-FINE PEN NEEDLES) 29G X 12.7MM MISC; 1 each by Does not apply route 4 (four) times daily.  Dispense: 100 each; Refill: 5 - insulin aspart (NOVOLOG) 100 UNIT/ML FlexPen; Inject 8 Units into the skin 3 (three) times daily with meals.  Dispense: 15 mL; Refill: 11  2. Neuropathy (HCC) Will increase gabapentin for pain.  - gabapentin (NEURONTIN) 400 MG capsule; Take 1 capsule (400 mg total) by mouth 4 (four) times daily.  Dispense: 120 capsule; Refill: 0  3. Chronic generalized pain Will not prescribe opiate medications at this time. Will send a referral to pain clinic for work up and evaluation. Requesting referral to Dr. Gerilyn Pilgrim in [place] - Ambulatory referral to Pain Clinic - ketorolac (TORADOL) injection 60 mg; Inject 2 mLs (60 mg total) into the muscle once. - meloxicam (MOBIC) 15 MG tablet; Take 1 tablet (15 mg total) by mouth daily.  Dispense: 30 tablet; Refill: 2  4. Depression, recurrent (HCC) Depression screen [facility] 2/9 03/25/2016  Decreased Interest 3  Down, Depressed, Hopeless 3  PHQ - 2 Score 6  Altered sleeping 3  Tired, decreased energy 3  Change in appetite 0  Feeling bad or failure about yourself  3  Trouble concentrating 2  Moving slowly or fidgety/restless 2  Suicidal thoughts 3  PHQ-9 Score 22  Difficult doing work/chores Somewhat difficult  [facility] as scheduled. He  does not have a suicide plan. He denies hallucinations. He appears irritated and anxious this am.    RTC: 1 month follow up.   The patient was given clear instructions to go to ER or return to medical center if symptoms do not improve, worsen or new problems develop. The patient verbalized understanding. Massie Maroon, FNP

## 2016-06-10 ENCOUNTER — Encounter (HOSPITAL_COMMUNITY): Payer: Self-pay | Admitting: *Deleted

## 2016-06-10 ENCOUNTER — Inpatient Hospital Stay (HOSPITAL_COMMUNITY)
Admission: EM | Admit: 2016-06-10 | Discharge: 2016-06-12 | DRG: 637 | Disposition: A | Discharge status: Home / Self Care | Payer: Medicaid Other | Attending: Internal Medicine | Admitting: Internal Medicine

## 2016-06-10 ENCOUNTER — Emergency Department (HOSPITAL_COMMUNITY): Payer: Medicaid Other

## 2016-06-10 DIAGNOSIS — Z9114 Patient's other noncompliance with medication regimen: Secondary | ICD-10-CM

## 2016-06-10 DIAGNOSIS — Z833 Family history of diabetes mellitus: Secondary | ICD-10-CM

## 2016-06-10 DIAGNOSIS — E860 Dehydration: Secondary | ICD-10-CM | POA: Diagnosis present

## 2016-06-10 DIAGNOSIS — Z681 Body mass index (BMI) 19 or less, adult: Secondary | ICD-10-CM | POA: Diagnosis not present

## 2016-06-10 DIAGNOSIS — Z888 Allergy status to other drugs, medicaments and biological substances status: Secondary | ICD-10-CM

## 2016-06-10 DIAGNOSIS — Z794 Long term (current) use of insulin: Secondary | ICD-10-CM | POA: Diagnosis not present

## 2016-06-10 DIAGNOSIS — E1010 Type 1 diabetes mellitus with ketoacidosis without coma: Secondary | ICD-10-CM | POA: Diagnosis not present

## 2016-06-10 DIAGNOSIS — E1310 Other specified diabetes mellitus with ketoacidosis without coma: Secondary | ICD-10-CM

## 2016-06-10 DIAGNOSIS — F329 Major depressive disorder, single episode, unspecified: Secondary | ICD-10-CM | POA: Diagnosis present

## 2016-06-10 DIAGNOSIS — E43 Unspecified severe protein-calorie malnutrition: Secondary | ICD-10-CM | POA: Diagnosis present

## 2016-06-10 DIAGNOSIS — G8929 Other chronic pain: Secondary | ICD-10-CM | POA: Diagnosis not present

## 2016-06-10 DIAGNOSIS — E118 Type 2 diabetes mellitus with unspecified complications: Secondary | ICD-10-CM

## 2016-06-10 DIAGNOSIS — F339 Major depressive disorder, recurrent, unspecified: Secondary | ICD-10-CM | POA: Diagnosis not present

## 2016-06-10 DIAGNOSIS — E1142 Type 2 diabetes mellitus with diabetic polyneuropathy: Secondary | ICD-10-CM | POA: Diagnosis present

## 2016-06-10 DIAGNOSIS — G629 Polyneuropathy, unspecified: Secondary | ICD-10-CM | POA: Diagnosis not present

## 2016-06-10 DIAGNOSIS — F17210 Nicotine dependence, cigarettes, uncomplicated: Secondary | ICD-10-CM | POA: Diagnosis present

## 2016-06-10 DIAGNOSIS — Z791 Long term (current) use of non-steroidal anti-inflammatories (NSAID): Secondary | ICD-10-CM | POA: Diagnosis not present

## 2016-06-10 DIAGNOSIS — Z79899 Other long term (current) drug therapy: Secondary | ICD-10-CM

## 2016-06-10 DIAGNOSIS — R112 Nausea with vomiting, unspecified: Secondary | ICD-10-CM | POA: Diagnosis not present

## 2016-06-10 DIAGNOSIS — E1165 Type 2 diabetes mellitus with hyperglycemia: Secondary | ICD-10-CM

## 2016-06-10 DIAGNOSIS — E871 Hypo-osmolality and hyponatremia: Secondary | ICD-10-CM | POA: Diagnosis present

## 2016-06-10 DIAGNOSIS — IMO0002 Reserved for concepts with insufficient information to code with codable children: Secondary | ICD-10-CM

## 2016-06-10 DIAGNOSIS — Z886 Allergy status to analgesic agent status: Secondary | ICD-10-CM | POA: Diagnosis not present

## 2016-06-10 DIAGNOSIS — E1110 Type 2 diabetes mellitus with ketoacidosis without coma: Secondary | ICD-10-CM | POA: Diagnosis present

## 2016-06-10 DIAGNOSIS — R52 Pain, unspecified: Secondary | ICD-10-CM | POA: Diagnosis present

## 2016-06-10 LAB — URINALYSIS, ROUTINE W REFLEX MICROSCOPIC
BACTERIA UA: NONE SEEN
Bilirubin Urine: NEGATIVE
Glucose, UA: >=500 mg/dL — AB
Hgb urine dipstick: NEGATIVE
Ketones, ur: 80 mg/dL — AB
LEUKOCYTES UA: NEGATIVE
Nitrite: NEGATIVE
PROTEIN: NEGATIVE mg/dL
SPECIFIC GRAVITY, URINE: 1.025 (ref 1.005–1.030)
pH: 5 (ref 5.0–8.0)

## 2016-06-10 LAB — CBC WITH DIFFERENTIAL/PLATELET
BASOS ABS: 0.1 10*3/uL (ref 0.0–0.1)
Basophils Relative: 1 %
Eosinophils Absolute: 0.1 10*3/uL (ref 0.0–0.7)
Eosinophils Relative: 1 %
HEMATOCRIT: 48 % (ref 39.0–52.0)
Hemoglobin: 16.7 g/dL (ref 13.0–17.0)
LYMPHS ABS: 1.6 10*3/uL (ref 0.7–4.0)
LYMPHS PCT: 18 %
MCH: 31.8 pg (ref 26.0–34.0)
MCHC: 34.8 g/dL (ref 30.0–36.0)
MCV: 91.4 fL (ref 78.0–100.0)
MONO ABS: 0.5 10*3/uL (ref 0.1–1.0)
Monocytes Relative: 6 %
NEUTROS ABS: 6.7 10*3/uL (ref 1.7–7.7)
Neutrophils Relative %: 74 %
Platelets: 237 10*3/uL (ref 150–400)
RBC: 5.25 MIL/uL (ref 4.22–5.81)
RDW: 12.1 % (ref 11.5–15.5)
WBC: 9 10*3/uL (ref 4.0–10.5)

## 2016-06-10 LAB — BASIC METABOLIC PANEL
ANION GAP: 9 (ref 5–15)
ANION GAP: 9 (ref 5–15)
ANION GAP: 7 (ref 5–15)
BUN: 21 mg/dL — ABNORMAL HIGH (ref 6–20)
BUN: 20 mg/dL (ref 6–20)
BUN: 21 mg/dL — AB (ref 6–20)
CALCIUM: 9.3 mg/dL (ref 8.9–10.3)
CALCIUM: 9 mg/dL (ref 8.9–10.3)
CALCIUM: 8.5 mg/dL — AB (ref 8.9–10.3)
CO2: 28 mmol/L (ref 22–32)
CO2: 30 mmol/L (ref 22–32)
CO2: 27 mmol/L (ref 22–32)
Chloride: 97 mmol/L — ABNORMAL LOW (ref 101–111)
Chloride: 94 mmol/L — ABNORMAL LOW (ref 101–111)
Chloride: 97 mmol/L — ABNORMAL LOW (ref 101–111)
Creatinine, Ser: 0.67 mg/dL (ref 0.61–1.24)
Creatinine, Ser: 0.86 mg/dL (ref 0.61–1.24)
Creatinine, Ser: 1.05 mg/dL (ref 0.61–1.24)
GFR calc Af Amer: >60 mL/min (ref >60)
GLUCOSE: 174 mg/dL — AB (ref 65–99)
GLUCOSE: 311 mg/dL — AB (ref 65–99)
GLUCOSE: 446 mg/dL — AB (ref 65–99)
POTASSIUM: 3.8 mmol/L (ref 3.5–5.1)
Potassium: 4.4 mmol/L (ref 3.5–5.1)
Potassium: 4.3 mmol/L (ref 3.5–5.1)
SODIUM: 134 mmol/L — AB (ref 135–145)
SODIUM: 133 mmol/L — AB (ref 135–145)
SODIUM: 131 mmol/L — AB (ref 135–145)

## 2016-06-10 LAB — BLOOD GAS, VENOUS
ACID-BASE DEFICIT: 8 mmol/L — AB (ref 0.0–2.0)
BICARBONATE: 16.8 mmol/L — AB (ref 20.0–28.0)
FIO2: 21
O2 Saturation: 66.5 %
pCO2, Ven: 43.2 mmHg — ABNORMAL LOW (ref 44.0–60.0)
pH, Ven: 7.243 — ABNORMAL LOW (ref 7.250–7.430)
pO2, Ven: 40.6 mmHg (ref 32.0–45.0)

## 2016-06-10 LAB — MAGNESIUM: MAGNESIUM: 1.8 mg/dL (ref 1.7–2.4)

## 2016-06-10 LAB — I-STAT CHEM 8, ED
BUN: 27 mg/dL — AB (ref 6–20)
CALCIUM ION: 1.2 mmol/L (ref 1.15–1.40)
Chloride: 90 mmol/L — ABNORMAL LOW (ref 101–111)
Creatinine, Ser: 0.8 mg/dL (ref 0.61–1.24)
Glucose, Bld: >700 mg/dL (ref 65–99)
HCT: 55 % — ABNORMAL HIGH (ref 39.0–52.0)
Hemoglobin: 18.7 g/dL — ABNORMAL HIGH (ref 13.0–17.0)
Potassium: 4.9 mmol/L (ref 3.5–5.1)
SODIUM: 129 mmol/L — AB (ref 135–145)
TCO2: 22 mmol/L (ref 0–100)

## 2016-06-10 LAB — GLUCOSE, CAPILLARY
GLUCOSE-CAPILLARY: 88 mg/dL (ref 65–99)
GLUCOSE-CAPILLARY: 177 mg/dL — AB (ref 65–99)
GLUCOSE-CAPILLARY: 445 mg/dL — AB (ref 65–99)
Glucose-Capillary: 200 mg/dL — ABNORMAL HIGH (ref 65–99)
Glucose-Capillary: 183 mg/dL — ABNORMAL HIGH (ref 65–99)
Glucose-Capillary: 93 mg/dL (ref 65–99)
Glucose-Capillary: 309 mg/dL — ABNORMAL HIGH (ref 65–99)

## 2016-06-10 LAB — COMPREHENSIVE METABOLIC PANEL
ALT: 18 U/L (ref 17–63)
AST: 22 U/L (ref 15–41)
Albumin: 4.5 g/dL (ref 3.5–5.0)
Alkaline Phosphatase: 72 U/L (ref 38–126)
BILIRUBIN TOTAL: 2.1 mg/dL — AB (ref 0.3–1.2)
BUN: 26 mg/dL — AB (ref 6–20)
CHLORIDE: 86 mmol/L — AB (ref 101–111)
CO2: 19 mmol/L — AB (ref 22–32)
Calcium: 9.8 mg/dL (ref 8.9–10.3)
Creatinine, Ser: 1.16 mg/dL (ref 0.61–1.24)
GFR calc Af Amer: >60 mL/min (ref >60)
GFR calc non Af Amer: >60 mL/min (ref >60)
Glucose, Bld: 725 mg/dL (ref 65–99)
POTASSIUM: 5.1 mmol/L (ref 3.5–5.1)
Sodium: 126 mmol/L — ABNORMAL LOW (ref 135–145)
Total Protein: 7.9 g/dL (ref 6.5–8.1)

## 2016-06-10 LAB — MRSA PCR SCREENING: MRSA BY PCR: NEGATIVE

## 2016-06-10 LAB — CBG MONITORING, ED
Glucose-Capillary: >600 mg/dL (ref 65–99)
Glucose-Capillary: 547 mg/dL (ref 65–99)
Glucose-Capillary: 387 mg/dL — ABNORMAL HIGH (ref 65–99)
Glucose-Capillary: 228 mg/dL — ABNORMAL HIGH (ref 65–99)

## 2016-06-10 LAB — INFLUENZA PANEL BY PCR (TYPE A & B)
INFLAPCR: NEGATIVE
INFLBPCR: NEGATIVE

## 2016-06-10 LAB — LIPASE, BLOOD: LIPASE: 20 U/L (ref 11–51)

## 2016-06-10 MED ORDER — INSULIN GLARGINE 100 UNIT/ML ~~LOC~~ SOLN
10.0000 [IU] | Freq: Every day | SUBCUTANEOUS | Status: DC
  Administered 2016-06-10 – 2016-06-11 (×2): 10 [IU] via SUBCUTANEOUS
  Filled 2016-06-10 (×4): qty 0.1

## 2016-06-10 MED ORDER — ONDANSETRON HCL 4 MG/2ML IJ SOLN
4.0000 mg | Freq: Once | INTRAMUSCULAR | Status: AC
  Administered 2016-06-10: 4 mg via INTRAVENOUS
  Filled 2016-06-10: qty 2

## 2016-06-10 MED ORDER — ENOXAPARIN SODIUM 40 MG/0.4ML ~~LOC~~ SOLN
40.0000 mg | SUBCUTANEOUS | Status: DC
  Administered 2016-06-10 – 2016-06-11 (×2): 40 mg via SUBCUTANEOUS
  Filled 2016-06-10 (×2): qty 0.4

## 2016-06-10 MED ORDER — SODIUM CHLORIDE 0.9 % IV SOLN
INTRAVENOUS | Status: DC
  Administered 2016-06-10: 5.4 [IU]/h via INTRAVENOUS
  Administered 2016-06-10: 4.2 [IU]/h via INTRAVENOUS
  Filled 2016-06-10: qty 2.5

## 2016-06-10 MED ORDER — SODIUM CHLORIDE 0.9 % IV SOLN
INTRAVENOUS | Status: DC
  Administered 2016-06-10: 1000 mL via INTRAVENOUS
  Administered 2016-06-11 – 2016-06-12 (×3): via INTRAVENOUS

## 2016-06-10 MED ORDER — FLUOXETINE HCL 20 MG PO CAPS
20.0000 mg | ORAL_CAPSULE | Freq: Every day | ORAL | Status: DC
  Administered 2016-06-10 – 2016-06-12 (×3): 20 mg via ORAL
  Filled 2016-06-10 (×3): qty 1

## 2016-06-10 MED ORDER — SODIUM CHLORIDE 0.9 % IV SOLN
INTRAVENOUS | Status: DC
  Administered 2016-06-10: 09:00:00 via INTRAVENOUS

## 2016-06-10 MED ORDER — INSULIN ASPART 100 UNIT/ML ~~LOC~~ SOLN
0.0000 [IU] | Freq: Three times a day (TID) | SUBCUTANEOUS | Status: DC
  Administered 2016-06-11: 3 [IU] via SUBCUTANEOUS
  Administered 2016-06-11: 7 [IU] via SUBCUTANEOUS
  Administered 2016-06-12: 2 [IU] via SUBCUTANEOUS
  Administered 2016-06-12: 5 [IU] via SUBCUTANEOUS

## 2016-06-10 MED ORDER — INSULIN ASPART 100 UNIT/ML ~~LOC~~ SOLN
4.0000 [IU] | Freq: Three times a day (TID) | SUBCUTANEOUS | Status: DC
  Administered 2016-06-11 (×3): 4 [IU] via SUBCUTANEOUS

## 2016-06-10 MED ORDER — GABAPENTIN 300 MG PO CAPS
300.0000 mg | ORAL_CAPSULE | Freq: Three times a day (TID) | ORAL | Status: DC
  Administered 2016-06-10 – 2016-06-12 (×7): 300 mg via ORAL
  Filled 2016-06-10 (×7): qty 1

## 2016-06-10 MED ORDER — PANTOPRAZOLE SODIUM 40 MG PO TBEC
40.0000 mg | DELAYED_RELEASE_TABLET | Freq: Every day | ORAL | Status: DC
  Administered 2016-06-10 – 2016-06-12 (×3): 40 mg via ORAL
  Filled 2016-06-10 (×3): qty 1

## 2016-06-10 MED ORDER — MORPHINE SULFATE (PF) 4 MG/ML IV SOLN
4.0000 mg | Freq: Once | INTRAVENOUS | Status: AC
  Administered 2016-06-10: 4 mg via INTRAVENOUS
  Filled 2016-06-10: qty 1

## 2016-06-10 MED ORDER — DEXTROSE 50 % IV SOLN: 25.0000 mL | INTRAVENOUS | Status: DC | PRN

## 2016-06-10 MED ORDER — DEXTROSE-NACL 5-0.45 % IV SOLN
INTRAVENOUS | Status: DC
  Administered 2016-06-10: 1000 mL via INTRAVENOUS

## 2016-06-10 MED ORDER — HYDROXYZINE HCL 25 MG PO TABS
25.0000 mg | ORAL_TABLET | Freq: Three times a day (TID) | ORAL | Status: DC | PRN
  Administered 2016-06-10 – 2016-06-12 (×2): 25 mg via ORAL
  Filled 2016-06-10 (×2): qty 1

## 2016-06-10 MED ORDER — INSULIN GLARGINE 100 UNIT/ML ~~LOC~~ SOLN
SUBCUTANEOUS | Status: AC
  Filled 2016-06-10: qty 10

## 2016-06-10 MED ORDER — HYDROCODONE-ACETAMINOPHEN 5-325 MG PO TABS
1.0000 | ORAL_TABLET | ORAL | Status: DC | PRN
  Administered 2016-06-10 – 2016-06-11 (×4): 2 via ORAL
  Administered 2016-06-11: 1 via ORAL
  Administered 2016-06-12 (×3): 2 via ORAL
  Filled 2016-06-10 (×3): qty 2
  Filled 2016-06-10: qty 1
  Filled 2016-06-10 (×4): qty 2

## 2016-06-10 MED ORDER — SODIUM CHLORIDE 0.9 % IV SOLN: INTRAVENOUS | Status: AC

## 2016-06-10 MED ORDER — INSULIN REGULAR BOLUS VIA INFUSION
0.0000 [IU] | Freq: Three times a day (TID) | INTRAVENOUS | Status: DC
  Filled 2016-06-10: qty 10

## 2016-06-10 MED ORDER — ENSURE ENLIVE PO LIQD
237.0000 mL | Freq: Two times a day (BID) | ORAL | Status: DC
  Administered 2016-06-11: 237 mL via ORAL

## 2016-06-10 MED ORDER — POTASSIUM CHLORIDE CRYS ER 20 MEQ PO TBCR
20.0000 meq | EXTENDED_RELEASE_TABLET | Freq: Two times a day (BID) | ORAL | Status: DC
  Administered 2016-06-10 – 2016-06-12 (×5): 20 meq via ORAL
  Filled 2016-06-10 (×5): qty 1

## 2016-06-10 MED ORDER — CYCLOBENZAPRINE HCL 10 MG PO TABS
10.0000 mg | ORAL_TABLET | Freq: Once | ORAL | Status: AC
  Administered 2016-06-10: 10 mg via ORAL
  Filled 2016-06-10: qty 1

## 2016-06-10 MED ORDER — SODIUM CHLORIDE 0.9 % IV SOLN
INTRAVENOUS | Status: DC
  Administered 2016-06-10: 4.9 [IU]/h via INTRAVENOUS
  Filled 2016-06-10: qty 2.5

## 2016-06-10 MED ORDER — SODIUM CHLORIDE 0.9 % IV BOLUS (SEPSIS)
1000.0000 mL | Freq: Once | INTRAVENOUS | Status: AC
  Administered 2016-06-10: 1000 mL via INTRAVENOUS

## 2016-06-10 MED ORDER — DEXTROSE-NACL 5-0.45 % IV SOLN: INTRAVENOUS | Status: DC

## 2016-06-10 MED ORDER — INSULIN ASPART 100 UNIT/ML ~~LOC~~ SOLN
0.0000 [IU] | Freq: Every day | SUBCUTANEOUS | Status: DC
  Administered 2016-06-10: 5 [IU] via SUBCUTANEOUS

## 2016-06-10 NOTE — Progress Notes (Signed)
Patient ate first tray and is mad that he does not have a second tray because he said he has not eaten in 3 days. Waiting for Dr. To come and talk to patient.

## 2016-06-10 NOTE — Progress Notes (Signed)
Stopped insulin for 1 hour after CBG dropped below 100. Second check was 1888 will leave off for another hour. Diet ordered.

## 2016-06-10 NOTE — Progress Notes (Signed)
Received diabetes coordinator consult. Patient still on IV insulin. Admitted with blood sugar of 725 mg/dl.  HgbA1C is 12.2%.  Will follow patient while in the hospital. When patient is ready to transition off drip,   recommend starting on Lantus 10 units daily,(starting 2 hours before transitioning off the insulin drip), Novolog SENSITIVE correction scale TID & HS,(or every 4 hours if NPO), and Novolog 4 units TID with meals. Titrate dosage as needed.  Smith MinceKendra Reynold Mantell RN BSN CDE

## 2016-06-10 NOTE — ED Triage Notes (Signed)
Pt comes in from home by EMS for hyperglycemia. It was reading the the 300's at home. Pt states he has been vomiting. NAD noted. Pt ambulated to the stretcher.

## 2016-06-10 NOTE — H&P (Signed)
History and Physical    TAYSON SCHNELLE FGZ:[id number] DOB: 1965/12/22 DOA: 06/10/2016  PCP: Massie Maroon, FNP  Patient coming from: Home  Chief Complaint: Abdominal pain, nausea, vomiting  HPI: Kenneth [name] is a 51 y.o. male with medical history significant of uncontrolled diabetes mellitus Type II, .  Patient states that he does not normally feel great, but 4-5 days ago he began feeling poorly.  He began vomiting and could not tolerated any PO.  Says he was taking his insulin as instructed but his blood sugar was fluctuating between normal and very high.  Says that he would break out in sneezing.  Knows he needed to try to eat but voices he could not tolerate any food.  Has a taste of sugar in his mouth and has been drinking significant amount of water secondary to how thirsty he is.  Does not have income currently and just got approved for Medicaid 2 days ago.  Waiting to be approved for his disability.  Says he does not have any income to buy his medications.  Has a bit of medication at home but when this runs out voices he cannot afford to buy any other medication.    ED Course: Patient found to be in DKA- Bicarb of 16.8, glucose of 725, pH of 7.243.  He was also found to be polycythemic with an H/H of 18.7/55.0 (likely due to volume depletion).  Glucose stabilizer was started.   Review of Systems: As per HPI otherwise 10 point review of systems negative.    Past Medical History:  Diagnosis Date  . Arthritis   . Diabetes mellitus   . DKA (diabetic ketoacidosis) (HCC) 07/2014  . Malnutrition (HCC)   . Noncompliance with medication regimen   . Thyroid disease     History reviewed. No pertinent surgical history.   reports that he has been smoking.  He has a 4.00 pack-year smoking history. He has never used smokeless tobacco. He reports that he does not drink alcohol or use drugs.  Allergies  Allergen Reactions  . Metformin And Related Other (See Comments)    Abdominal cramps and  diarrhea  . Nsaids Nausea Only    Family History  Problem Relation Age of Onset  . Diabetes Mother   . Seizures Sister   . Diabetes Sister      Prior to Admission medications   Medication Sig Start Date End Date Taking? Authorizing Provider  Blood Glucose Monitoring Suppl (TRUE METRIX AIR GLUCOSE METER) w/Device KIT 1 each by Does not apply route 4 (four) times daily -  before meals and at bedtime. 03/25/16  Yes Massie Maroon, FNP  glucose blood (TRUE METRIX BLOOD GLUCOSE TEST) test strip Use as instructed 03/25/16  Yes Massie Maroon, FNP  insulin aspart (NOVOLOG) 100 UNIT/ML FlexPen Inject 8 Units into the skin 3 (three) times daily with meals. 04/26/16  Yes Massie Maroon, FNP  insulin aspart (NOVOLOG) 100 unit/mL injection Per sliding scale 03/25/16  Yes Massie Maroon, FNP  Insulin Pen Needle (BD ULTRA-FINE PEN NEEDLES) 29G X 12.7MM MISC 1 each by Does not apply route 4 (four) times daily. 04/26/16  Yes Massie Maroon, FNP  doxepin (SINEQUAN) 25 MG capsule Take 1 capsule (25 mg total) by mouth at bedtime as needed (insomnia). 04/05/16   Oneta Rack, NP  FLUoxetine (PROZAC) 20 MG tablet Take 1 tablet by mouth daily. 04/06/16   Historical Provider, MD  gabapentin (NEURONTIN) 300 MG capsule Take 1  capsule by mouth 3 (three) times daily. 03/25/16   Historical Provider, MD  gabapentin (NEURONTIN) 400 MG capsule Take 1 capsule by mouth 4 (four) times daily. 04/29/16   Historical Provider, MD  hydrOXYzine (ATARAX/VISTARIL) 25 MG tablet Take 1 tablet (25 mg total) by mouth 3 (three) times daily as needed for anxiety. 04/05/16   Derrill Center, NP  meloxicam (MOBIC) 15 MG tablet Take 1 tablet (15 mg total) by mouth daily. 04/26/16   Dorena Dew, FNP  pantoprazole (PROTONIX) 40 MG tablet Take 1 tablet (40 mg total) by mouth daily. 04/06/16   Derrill Center, NP    Physical Exam: Vitals:   06/10/16 1500 06/10/16 1600 06/10/16 1700 06/10/16 1800  BP:  105/73    Pulse: 70 74 82 87  Resp: (!)  '23 17 17 19  '$ Temp:      TempSrc:      SpO2: 99% 97% 100% 96%  Weight:      Height:          Constitutional: NAD, calm, comfortable, cachectic man Vitals:   06/10/16 1500 06/10/16 1600 06/10/16 1700 06/10/16 1800  BP:  105/73    Pulse: 70 74 82 87  Resp: (!) '23 17 17 19  '$ Temp:      TempSrc:      SpO2: 99% 97% 100% 96%  Weight:      Height:       Eyes: PERRL, lids and conjunctivae normal ENMT: Mucous membranes are dry. Posterior pharynx clear of any exudate or lesions.Very poor dentition. Neck: normal, supple, no masses, no thyromegaly Respiratory: clear to auscultation bilaterally, no wheezing, no crackles. Normal respiratory effort. No accessory muscle use.  Cardiovascular: Regular rate and rhythm, no murmurs / rubs / gallops. No extremity edema. 2+ pedal pulses. No carotid bruits.  Abdomen: no tenderness, no masses palpated. No hepatosplenomegaly. Bowel sounds positive.  Musculoskeletal: no clubbing / cyanosis. No joint deformity upper and lower extremities. Good ROM, no contractures. Muscle wasting Skin: no rashes, lesions, ulcers. No induration Neurologic: CN 2-12 grossly intact. Sensation intact, DTR normal. Strength 5/5 in all 4.  Psychiatric: Normal judgment and insight. Alert and oriented x 3. Normal mood.    Labs on Admission: I have personally reviewed following labs and imaging studies  CBC:  Recent Labs Lab 06/10/16 0817 06/10/16 0832  WBC 9.0  --   NEUTROABS 6.7  --   HGB 16.7 18.7*  HCT 48.0 55.0*  MCV 91.4  --   PLT 237  --    Basic Metabolic Panel:  Recent Labs Lab 06/10/16 0817 06/10/16 0832 06/10/16 1434  NA 126* 129* 134*  K 5.1 4.9 3.8  CL 86* 90* 97*  CO2 19*  --  28  GLUCOSE 725* >700* 174*  BUN 26* 27* 21*  CREATININE 1.16 0.80 0.67  CALCIUM 9.8  --  9.3  MG  --   --  1.8   GFR: Estimated Creatinine Clearance: 79.1 mL/min (by C-G formula based on SCr of 0.67 mg/dL). Liver Function Tests:  Recent Labs Lab 06/10/16 0817  AST  22  ALT 18  ALKPHOS 72  BILITOT 2.1*  PROT 7.9  ALBUMIN 4.5    Recent Labs Lab 06/10/16 0817  LIPASE 20   No results for input(s): AMMONIA in the last 168 hours. Coagulation Profile: No results for input(s): INR, PROTIME in the last 168 hours. Cardiac Enzymes: No results for input(s): CKTOTAL, CKMB, CKMBINDEX, TROPONINI in the last 168 hours. BNP (last 3  results) No results for input(s): PROBNP in the last 8760 hours. HbA1C: No results for input(s): HGBA1C in the last 72 hours. CBG:  Recent Labs Lab 06/10/16 1410 06/10/16 1432 06/10/16 1532 06/10/16 1630 06/10/16 1733  GLUCAP 200* 183* 93 88 177*   Lipid Profile: No results for input(s): CHOL, HDL, LDLCALC, TRIG, CHOLHDL, LDLDIRECT in the last 72 hours. Thyroid Function Tests: No results for input(s): TSH, T4TOTAL, FREET4, T3FREE, THYROIDAB in the last 72 hours. Anemia Panel: No results for input(s): VITAMINB12, FOLATE, FERRITIN, TIBC, IRON, RETICCTPCT in the last 72 hours. Urine analysis:    Component Value Date/Time   COLORURINE COLORLESS (A) 06/10/2016 0906   APPEARANCEUR CLEAR 06/10/2016 0906   LABSPEC 1.025 06/10/2016 0906   PHURINE 5.0 06/10/2016 0906   GLUCOSEU >=500 (A) 06/10/2016 0906   HGBUR NEGATIVE 06/10/2016 0906   BILIRUBINUR NEGATIVE 06/10/2016 0906   KETONESUR 80 (A) 06/10/2016 0906   PROTEINUR NEGATIVE 06/10/2016 0906   UROBILINOGEN 0.2 01/18/2015 2300   NITRITE NEGATIVE 06/10/2016 0906   LEUKOCYTESUR NEGATIVE 06/10/2016 0906   Sepsis Labs: !!!!!!!!!!!!!!!!!!!!!!!!!!!!!!!!!!!!!!!!!!!! '@LABRCNTIP'$ (procalcitonin:4,lacticidven:4) ) Recent Results (from the past 240 hour(s))  MRSA PCR Screening     Status: None   Collection Time: 06/10/16  2:33 PM  Result Value Ref Range Status   MRSA by PCR NEGATIVE NEGATIVE Final    Comment:        The GeneXpert MRSA Assay (FDA approved for NASAL specimens only), is one component of a comprehensive MRSA colonization surveillance program. It is  not intended to diagnose MRSA infection nor to guide or monitor treatment for MRSA infections.      Radiological Exams on Admission: Dg Abdomen Acute W/chest  Result Date: 06/10/2016 CLINICAL DATA:  51 year old male with abdominal pain nausea and vomiting for 4 days. Not tolerating p.o. Initial encounter. EXAM: DG ABDOMEN ACUTE W/ 1V CHEST COMPARISON:  CTA chest abdomen and pelvis 04/04/2016 and earlier. FINDINGS: Seated upright AP view of the chest. Normal lung volumes. Normal cardiac size and mediastinal contours. Visualized tracheal air column is within normal limits. No pneumothorax or pneumoperitoneum. The lungs are clear. Upright and supine views of the abdomen and pelvis. Non obstructed bowel gas pattern. Similar volume of retained stool in the colon compared to the CT on 02/29/2016. Abdominal and pelvic visceral contours are within normal limits. No acute osseous abnormality identified. IMPRESSION: 1.  Normal bowel gas pattern, no free air. 2. Negative chest. Electronically Signed   By: Genevie Ann M.D.   On: 06/10/2016 10:06      Assessment/Plan Active Problems:   DKA (diabetic ketoacidoses) (HCC)   Hyponatremia   Depression, recurrent (HCC)   Chronic generalized pain   Neuropathy (HCC)    DKA - glucose stabilizer - will restart home insulin when blood glucose stabilizes - Carb modified diet - received fluid resuscitation - diabetes educator consulted - case management consulted for assistance in medication costs  Hyponatremia - related to above - BMP q4h  Depression - restart Prozac '20mg'$  daily  Neuropathy - restart gabapentin '300mg'$  TID  Chronic generalized pain - not on any medication at home other than gabapentin   DVT prophylaxis: Lovenox  Code Status: Full  Family Communication: No family bedside  Disposition Plan: Likely home when glucose improved  Consults called: None  Admission status: inpatient   Loretha Stapler MD [facility] Pager [phone]  If 7PM-7AM, please contact night-coverage [email or web address] Password [facility]  06/10/2016, 6:11 PM

## 2016-06-10 NOTE — ED Provider Notes (Signed)
[facility] DEPT Provider Note   CSN: [id number] Arrival date & time: 06/10/16  0808     History   Chief Complaint Chief Complaint  Patient presents with  . Hyperglycemia    HPI Kenneth [name] is a 51 y.o. male.  HPI   Pt with hx DM, DKA, malnutrition, noncompliance p/w 3 days of poorly controlled blood sugars, uncontrolled N/V, decreased appetite, diffuse body pain including abdominal pain.  Had normal BM yesterday, has been able to pass flatus.   The abdominal pain is diffuse, also notes fluttering in his chest.  Has coughing productive yellow sputum and has sneezing.  Blood sugars have been as high as 600s, despite taking insulin.  Hasn't tolerated PO in 2 days.  Urinating frequently.  Feels dehydrated.  States this feels like prior DKA.  PCP NP Smith Robert, [facility] [place].   Past Medical History:  Diagnosis Date  . Arthritis   . Diabetes mellitus   . DKA (diabetic ketoacidosis) ([place]) 07/2014  . Malnutrition ([place])   . Noncompliance with medication regimen   . Thyroid disease     Patient Active Problem List   Diagnosis Date Noted  . Chronic generalized pain 04/26/2016  . Neuropathy ([place]) 04/26/2016  . Diabetes mellitus type 2, uncontrolled, with complications ([place])   . MDD (major depressive disorder), recurrent severe, without psychosis ( [place]) 03/27/2016  . Depression, recurrent ([place])   . Abdominal pain   . Hyperglycemia 02/24/2016  . Volume depletion 12/21/2015  . Metabolic acidosis 54/00/8676  . Diabetes mellitus with nonketotic hyperosmolarity ([place]) 09/21/2014  . Noncompliance with medication regimen   . Diabetes mellitus ([place])   . DKA (diabetic ketoacidoses) ([place]) 07/24/2014  . Dehydration 07/24/2014  . Acute renal failure ([place]) 07/24/2014  . Hyperkalemia 07/24/2014  . Hyponatremia 07/24/2014  . Hyperthyroidism 07/24/2014    History reviewed. No pertinent surgical history.     Home Medications    Prior to Admission medications     Medication Sig Start Date End Date Taking? Authorizing Provider  Blood Glucose Monitoring Suppl (TRUE METRIX AIR GLUCOSE METER) w/Device KIT 1 each by Does not apply route 4 (four) times daily -  before meals and at bedtime. 03/25/16  Yes Dorena Dew, FNP  glucose blood (TRUE METRIX BLOOD GLUCOSE TEST) test strip Use as instructed 03/25/16  Yes Dorena Dew, FNP  insulin aspart (NOVOLOG) 100 UNIT/ML FlexPen Inject 8 Units into the skin 3 (three) times daily with meals. 04/26/16  Yes Dorena Dew, FNP  insulin aspart (NOVOLOG) 100 unit/mL injection Per sliding scale 03/25/16  Yes Dorena Dew, FNP  Insulin Pen Needle (BD ULTRA-FINE PEN NEEDLES) 29G X 12.7MM MISC 1 each by Does not apply route 4 (four) times daily. 04/26/16  Yes Dorena Dew, FNP  doxepin (SINEQUAN) 25 MG capsule Take 1 capsule (25 mg total) by mouth at bedtime as needed (insomnia). 04/05/16   Derrill Center, NP  FLUoxetine (PROZAC) 20 MG tablet Take 1 tablet by mouth daily. 04/06/16   Historical Provider, MD  gabapentin (NEURONTIN) 300 MG capsule Take 1 capsule by mouth 3 (three) times daily. 03/25/16   Historical Provider, MD  gabapentin (NEURONTIN) 400 MG capsule Take 1 capsule by mouth 4 (four) times daily. 04/29/16   Historical Provider, MD  hydrOXYzine (ATARAX/VISTARIL) 25 MG tablet Take 1 tablet (25 mg total) by mouth 3 (three) times daily as needed for anxiety. 04/05/16   Derrill Center, NP  meloxicam (MOBIC) 15 MG tablet Take 1  tablet (15 mg total) by mouth daily. 04/26/16   Dorena Dew, FNP  pantoprazole (PROTONIX) 40 MG tablet Take 1 tablet (40 mg total) by mouth daily. 04/06/16   Derrill Center, NP    Family History Family History  Problem Relation Age of Onset  . Diabetes Mother   . Seizures Sister   . Diabetes Sister     Social History Social History  Substance Use Topics  . Smoking status: Current Every Day Smoker    Packs/day: 0.50    Years: 8.00  . Smokeless tobacco: Never Used     Comment: states  he hasn't smoked since 06/05/16  . Alcohol use No     Allergies   Metformin and related and Nsaids   Review of Systems Review of Systems  All other systems reviewed and are negative.    Physical Exam Updated Vital Signs BP (!) 136/97 (BP Location: Right Arm)   Pulse (!) 103   Temp 98 F (36.7 C) (Oral)   Resp 18   Ht _0  (1.753 m)   Wt 63.5 kg   SpO2 100%   BMI 20.67 kg/m   Physical Exam  Constitutional: No distress.  Thin  HENT:  Head: Normocephalic and atraumatic.  Mouth/Throat: Mucous membranes are dry.  Neck: Normal range of motion. Neck supple.  Cardiovascular: Regular rhythm.  Tachycardia present.   Pulmonary/Chest: Effort normal and breath sounds normal. No respiratory distress. He has no wheezes. He has no rales.  Abdominal: Soft. He exhibits no distension. There is tenderness (diffuse ). There is no rebound and no guarding.  Neurological: He is alert. He exhibits normal muscle tone.  Skin: He is not diaphoretic.  Nursing note and vitals reviewed.    ED Treatments / Results  Labs (all labs ordered are listed, but only abnormal results are displayed) Labs Reviewed  COMPREHENSIVE METABOLIC PANEL - Abnormal; Notable for the following:       Result Value   Sodium 126 (*)    Chloride 86 (*)    CO2 19 (*)    Glucose, Bld 725 (*)    BUN 26 (*)    Total Bilirubin 2.1 (*)    All other components within normal limits  URINALYSIS, ROUTINE W REFLEX MICROSCOPIC - Abnormal; Notable for the following:    Color, Urine COLORLESS (*)    Glucose, UA >=500 (*)    Ketones, ur 80 (*)    All other components within normal limits  BLOOD GAS, VENOUS - Abnormal; Notable for the following:    pH, Ven 7.243 (*)    pCO2, Ven 43.2 (*)    Bicarbonate 16.8 (*)    Acid-base deficit 8.0 (*)    All other components within normal limits  CBG MONITORING, ED - Abnormal; Notable for the following:    Glucose-Capillary >600 (*)    All other components within normal limits    I-STAT CHEM 8, ED - Abnormal; Notable for the following:    Sodium 129 (*)    Chloride 90 (*)    BUN 27 (*)    Glucose, Bld >700 (*)    Hemoglobin 18.7 (*)    HCT 55.0 (*)    All other components within normal limits  CBG MONITORING, ED - Abnormal; Notable for the following:    Glucose-Capillary >600 (*)    All other components within normal limits  CBG MONITORING, ED - Abnormal; Notable for the following:    Glucose-Capillary 547 (*)    All other  components within normal limits  LIPASE, BLOOD  CBC WITH DIFFERENTIAL/PLATELET  INFLUENZA PANEL BY PCR (TYPE A & B)    EKG  EKG Interpretation None       Radiology Dg Abdomen Acute W/chest  Result Date: 06/10/2016 CLINICAL DATA:  51 year old male with abdominal pain nausea and vomiting for 4 days. Not tolerating p.o. Initial encounter. EXAM: DG ABDOMEN ACUTE W/ 1V CHEST COMPARISON:  CTA chest abdomen and pelvis 04/04/2016 and earlier. FINDINGS: Seated upright AP view of the chest. Normal lung volumes. Normal cardiac size and mediastinal contours. Visualized tracheal air column is within normal limits. No pneumothorax or pneumoperitoneum. The lungs are clear. Upright and supine views of the abdomen and pelvis. Non obstructed bowel gas pattern. Similar volume of retained stool in the colon compared to the CT on 02/29/2016. Abdominal and pelvic visceral contours are within normal limits. No acute osseous abnormality identified. IMPRESSION: 1.  Normal bowel gas pattern, no free air. 2. Negative chest. Electronically Signed   By: Genevie Ann M.D.   On: 06/10/2016 10:06    Procedures Procedures (including critical care time)  Medications Ordered in ED Medications  dextrose 5 %-0.45 % sodium chloride infusion (not administered)  insulin regular bolus via infusion 0-10 Units (not administered)  insulin regular (NOVOLIN R,HUMULIN R) 250 Units in sodium chloride 0.9 % 250 mL (1 Units/mL) infusion (5.4 Units/hr Intravenous New Bag/Given 06/10/16  0914)  dextrose 50 % solution 25 mL (not administered)  0.9 %  sodium chloride infusion ( Intravenous New Bag/Given 06/10/16 0906)  sodium chloride 0.9 % bolus 1,000 mL (1,000 mLs Intravenous New Bag/Given 06/10/16 0841)  ondansetron (ZOFRAN) injection 4 mg (4 mg Intravenous Given 06/10/16 0840)  morphine 4 MG/ML injection 4 mg (4 mg Intravenous Given 06/10/16 0905)     Initial Impression / Assessment and Plan / ED Course  I have reviewed the triage vital signs and the nursing notes.  Pertinent labs & imaging results that were available during my care of the patient were reviewed by me and considered in my medical decision making (see chart for details).  Clinical Course as of Jun 11 1011  Thu Jun 10, 2016  0914 Admitted to [facility]   [EW]    Clinical Course User Index [EW] Clayton Bibles, PA-C    Pt with known uncontrolled diabetes, difficulty controlling blood sugars recently, feeling worse and worse but trying to avoid coming to ED due to lack of insurance.  Received medicaid 2 days ago.  Also has not tolerated PO in 2 days, uncontrolled vomiting.  Found to be in DKA.  IVF, insulin per glucose stabilizer protocol ordered.  Admitted to [facility].    Final Clinical Impressions(s) / ED Diagnoses   Final diagnoses:  Diabetic ketoacidosis without coma associated with type 2 diabetes mellitus [facility])    New Prescriptions New Prescriptions   No medications on file     Clayton Bibles, PA-C 06/10/16 [place], MD 06/10/16 1434

## 2016-06-11 DIAGNOSIS — E43 Unspecified severe protein-calorie malnutrition: Secondary | ICD-10-CM | POA: Insufficient documentation

## 2016-06-11 LAB — HEMOGLOBIN A1C
HEMOGLOBIN A1C: 11.6 % — AB (ref 4.8–5.6)
MEAN PLASMA GLUCOSE: 286 mg/dL

## 2016-06-11 LAB — GLUCOSE, CAPILLARY
GLUCOSE-CAPILLARY: 113 mg/dL — AB (ref 65–99)
GLUCOSE-CAPILLARY: 236 mg/dL — AB (ref 65–99)
Glucose-Capillary: 321 mg/dL — ABNORMAL HIGH (ref 65–99)
Glucose-Capillary: 214 mg/dL — ABNORMAL HIGH (ref 65–99)

## 2016-06-11 MED ORDER — INSULIN ASPART 100 UNIT/ML ~~LOC~~ SOLN
8.0000 [IU] | Freq: Three times a day (TID) | SUBCUTANEOUS | Status: DC
  Administered 2016-06-12 (×2): 8 [IU] via SUBCUTANEOUS

## 2016-06-11 MED ORDER — GLUCERNA SHAKE PO LIQD
237.0000 mL | Freq: Three times a day (TID) | ORAL | Status: DC
  Administered 2016-06-11 – 2016-06-12 (×4): 237 mL via ORAL

## 2016-06-11 NOTE — Progress Notes (Signed)
Pt refuses cardiac  monitoring .  Dr informed

## 2016-06-11 NOTE — Care Management Note (Addendum)
Case Management Note  Patient Details  Name: Kenneth [name] MRN: [id number] Date of Birth: 08/30/1965  Subjective/Objective:  Adm with DKA. He lives alone, ind with ADL's. Has cane and walker PTA. Patient reports he has recently been approved for Medicaid. He does not have a Medicaid card. He reports that he needs a PCP and then will be issued a Medicaid card. His sister is helping him with finding a PCP. He states that Dr. Felecia ShellingFanta, Dr. Selena BattenKim and Dr. Janna Archondiego have stated that they will accept him as patient. He has not made a decision yet as to which PCP he wants. Therefore he will need to have a cheaper insulin prescribed until he has his Medicaid card. CM called Marylene Landngela his sister to discuss, but no answer. Patient provided list of PCP providers. Patient also states that Dr. Fransico HimNida has agreed to see patient once he has a PCP. CM urged patient to decide on PCP so he can finalize his Medicaid.    CM also left message for Financial counselor to discuss patient's Medicaid status. No answer.                Action/Plan: Anticipate DC home with self care.    Expected Discharge Date:     06/12/2016           Expected Discharge Plan:  Home/Self Care  In-House Referral:     Discharge planning Services  CM Consult  Post Acute Care Choice:    Choice offered to:     DME Arranged:    DME Agency:     HH Arranged:    HH Agency:     Status of Service:  In process, will continue to follow  If discussed at Long Length of Stay Meetings, dates discussed:    Additional Comments:  Lylianna Fraiser, Chrystine OilerSharley Diane, RN 06/11/2016, 2:25 PM

## 2016-06-11 NOTE — Progress Notes (Signed)
Inpatient Diabetes Program Recommendations Results for Kenneth RainwaterVERNON, Fermon [name] (MRN [id number]) as of 06/11/2016 11:46  Ref. Range 06/10/2016 10:08 06/10/2016 11:30 06/10/2016 12:55 06/10/2016 14:10 06/10/2016 14:32 06/10/2016 15:32 06/10/2016 16:30 06/10/2016 17:33 06/10/2016 18:30 06/10/2016 21:40 06/11/2016 07:37 06/11/2016 11:31  Glucose-Capillary Latest Ref Range: 65 - 99 mg/dL 409547 (HH) 811387 (H) 914228 (H) 200 (H) 183 (H) 93 88 177 (H) 309 (H) 445 (H) 113 (H) 321 (H)   Results for Kenneth RainwaterVERNON, Kenneth [name] (MRN [id number]) as of 06/11/2016 11:46  Ref. Range 06/10/2016 14:36  Hemoglobin A1C Latest Ref Range: 4.8 - 5.6 % 11.6 (H)   Review of Glycemic Control  Outpatient Diabetes medications: Novolog 8 units with meals, plus Novolog correction scale Current orders for Inpatient glycemic control: Lantus 10 units QHS, Novolog 4 units TID with meals  Inpatient Diabetes Program Recommendations: Insulin - Meal Coverage: Please consider increasing meal coverage to Novolog 8 units TID with meals for meal coverage if patient eats at least 50% of meals. Outpatient Referral: Patient would like to establish care with local PCP and eventually referred to Dr. Fransico HimNida (Endocrinologist). A1C: A1C 11.6% on 06/10/16 indicating an average glucose of 286 mg/dl over the past 2-3 months.  NOTE: Spoke with patient about diabetes and home regimen for diabetes control. Patient reports that he is followed by Massie MaroonHollis,Lachina M, FNP for diabetes management and currently he takes Novolog 8 units TID with meals plus Novolog correction scale as an outpatient for diabetes control. Patient reports that he was taking Lantus 25 units QHS and Novolog 8 units TID with meals when he had insurance but since he does not have insurance he can not afford his insulin. Patient reports that he has people that take insulin that will share their insulin with him sometimes. Patient reports that the only insulin he has at home is Novolog in the vial and he is almost out of it.  Patient  states that he would like to get back on insulin pens and he states that he got approved for Medicaid 3 days ago so he should be able to get insulin pens now.  Patient also states that he would like to find a local PCP in the area and eventually would like to be referred to Dr. Fransico HimNida for DM management. Patient reports that he is still in the process of trying to get approved for SSI and that he does not have any money for medications or foods. Patient reports that he is getting food from local churches when he can but he reports that he does not always get to eat. Patient states that he checks his glucose at home but not consistently. Patient reports that he has everything he needs at home for glucose monitoring.  Discussed A1C results (11.6% on 06/10/16) and explained that his current A1C indicates an average glucose of 286 mg/dl over the past 2-3 months. Discussed glucose and A1C goals. Discussed importance of checking CBGs and maintaining good CBG control to prevent long-term and short-term complications. Explained how hyperglycemia leads to damage within blood vessels which lead to the common complications seen with uncontrolled diabetes. Stressed to the patient the importance of improving glycemic control to prevent further complications from uncontrolled diabetes. Discussed impact of nutrition, exercise, stress, sickness, and medications on diabetes control. Per nursing, patient has been demanding extra food. Discussed Carb Modified diet and explained how excessive carbohydrates contribute to hyperglycemia. Patient has Ensure Enlive and Glucerna at bedside which he has been drinking and likely contributing to noted hyperglycemia (321  mg/dl at 16:10 today).  Discussed carbohydrates, carbohydrate goals per day and meal, along with portion sizes. Encouraged patient to check his glucose 3-4 times per day (before meals and at bedtime) and to keep a log book of glucose readings and insulin taken which he will need to  take to doctor appointments. Explained how the doctor he follows up with can use the log book to continue to make insulin adjustments if needed. Patient verbalized understanding of information discussed and he states that he has no further questions at this time related to diabetes. Per chart, patient is not listed as having Medicaid. Talked with Case Manager and asked to check and see if Medicaid can be verified. If patient has Medicaid he will need list of PCPs in the area accepting new Medicaid patients. Due to patient report that he does not get to consistently eat, it would be safer for him to take Lantus Q24H and to take Novolog meal coverage with meals plus additional Novolog for correction if needed versus 70/30 insulin. If patient does not have Medicaid, may need to discharge on NOVOLIN NPH and Regular.   Thanks, Orlando Penner, RN, MSN, CDE Diabetes Coordinator Inpatient Diabetes Program [phone] (Team Pager)

## 2016-06-11 NOTE — Progress Notes (Signed)
PROGRESS NOTE    Kenneth [name]  ZOX:[id number] DOB: [birth year] DOA: 06/10/2016 PCP: Massie Maroon, FNP     Brief Narrative:  51 y/o man admitted from home on 3/22 with abdominal pain, n/v. Found to be in DKA and admission requested.   Assessment & Plan:   Active Problems:   DKA (diabetic ketoacidoses) (HCC)   Hyponatremia   Depression, recurrent (HCC)   Chronic generalized pain   Neuropathy (HCC)   Protein-calorie malnutrition, severe   DKA -Resolved. -Has been transitioned off IV insulin. -Will increase meal coverage to 8 units.   DVT prophylaxis: lovenox Code Status: full code Family Communication: patient only Disposition Plan: hope for DC home in 24 hours  Consultants:   None  Procedures:   None  Antimicrobials:  Anti-infectives    None       Subjective: Still has some abdominal pain  Objective: Vitals:   06/11/16 0900 06/11/16 1000 06/11/16 1200 06/11/16 1610  BP: 100/75 (!) 104/59  115/71  Pulse: 77 73  76  Resp: (!) 25 17  18   Temp:   97.5 F (36.4 C) 97.9 F (36.6 C)  TempSrc:   Oral Oral  SpO2: 98% 99%  100%  Weight:      Height:        Intake/Output Summary (Last 24 hours) at 06/11/16 1809 Last data filed at 06/11/16 1612  Gross per 24 hour  Intake          2698.33 ml  Output             1200 ml  Net          1498.33 ml   Filed Weights   06/10/16 0811 06/10/16 1434  Weight: 63.5 kg (140 lb) 51.8 kg (114 lb 3.2 oz)    Examination:  General exam: Alert, awake, oriented x 3 Respiratory system: Clear to auscultation. Respiratory effort normal. Cardiovascular system:RRR. No murmurs, rubs, gallops. Gastrointestinal system: Abdomen is nondistended, soft and nontender. No organomegaly or masses felt. Normal bowel sounds heard. Central nervous system: Alert and oriented. No focal neurological deficits. Extremities: No C/C/E, +pedal pulses Skin: No rashes, lesions or ulcers Psychiatry: Judgement and insight appear normal. Mood  & affect appropriate.     Data Reviewed: I have personally reviewed following labs and imaging studies  CBC:  Recent Labs Lab 06/10/16 0817 06/10/16 0832  WBC 9.0  --   NEUTROABS 6.7  --   HGB 16.7 18.7*  HCT 48.0 55.0*  MCV 91.4  --   PLT 237  --    Basic Metabolic Panel:  Recent Labs Lab 06/10/16 0817 06/10/16 0832 06/10/16 1434 06/10/16 1817 06/10/16 2213  NA 126* 129* 134* 133* 131*  K 5.1 4.9 3.8 4.4 4.3  CL 86* 90* 97* 94* 97*  CO2 19*  --  28 30 27   GLUCOSE 725* >700* 174* 311* 446*  BUN 26* 27* 21* 20 21*  CREATININE 1.16 0.80 0.67 0.86 1.05  CALCIUM 9.8  --  9.3 9.0 8.5*  MG  --   --  1.8  --   --    GFR: Estimated Creatinine Clearance: 60.3 mL/min (by C-G formula based on SCr of 1.05 mg/dL). Liver Function Tests:  Recent Labs Lab 06/10/16 0817  AST 22  ALT 18  ALKPHOS 72  BILITOT 2.1*  PROT 7.9  ALBUMIN 4.5    Recent Labs Lab 06/10/16 0817  LIPASE 20   No results for input(s): AMMONIA in the last 168 hours. Coagulation  Profile: No results for input(s): INR, PROTIME in the last 168 hours. Cardiac Enzymes: No results for input(s): CKTOTAL, CKMB, CKMBINDEX, TROPONINI in the last 168 hours. BNP (last 3 results) No results for input(s): PROBNP in the last 8760 hours. HbA1C:  Recent Labs  06/10/16 1436  HGBA1C 11.6*   CBG:  Recent Labs Lab 06/10/16 1830 06/10/16 2140 06/11/16 0737 06/11/16 1131 06/11/16 1617  GLUCAP 309* 445* 113* 321* 236*   Lipid Profile: No results for input(s): CHOL, HDL, LDLCALC, TRIG, CHOLHDL, LDLDIRECT in the last 72 hours. Thyroid Function Tests: No results for input(s): TSH, T4TOTAL, FREET4, T3FREE, THYROIDAB in the last 72 hours. Anemia Panel: No results for input(s): VITAMINB12, FOLATE, FERRITIN, TIBC, IRON, RETICCTPCT in the last 72 hours. Urine analysis:    Component Value Date/Time   COLORURINE COLORLESS (A) 06/10/2016 0906   APPEARANCEUR CLEAR 06/10/2016 0906   LABSPEC 1.025 06/10/2016  0906   PHURINE 5.0 06/10/2016 0906   GLUCOSEU >=500 (A) 06/10/2016 0906   HGBUR NEGATIVE 06/10/2016 0906   BILIRUBINUR NEGATIVE 06/10/2016 0906   KETONESUR 80 (A) 06/10/2016 0906   PROTEINUR NEGATIVE 06/10/2016 0906   UROBILINOGEN 0.2 01/18/2015 2300   NITRITE NEGATIVE 06/10/2016 0906   LEUKOCYTESUR NEGATIVE 06/10/2016 0906   Sepsis Labs: @LABRCNTIP (procalcitonin:4,lacticidven:4)  ) Recent Results (from the past 240 hour(s))  MRSA PCR Screening     Status: None   Collection Time: 06/10/16  2:33 PM  Result Value Ref Range Status   MRSA by PCR NEGATIVE NEGATIVE Final    Comment:        The GeneXpert MRSA Assay (FDA approved for NASAL specimens only), is one component of a comprehensive MRSA colonization surveillance program. It is not intended to diagnose MRSA infection nor to guide or monitor treatment for MRSA infections.          Radiology Studies: Dg Abdomen Acute W/chest  Result Date: 06/10/2016 CLINICAL DATA:  51 year old male with abdominal pain nausea and vomiting for 4 days. Not tolerating p.o. Initial encounter. EXAM: DG ABDOMEN ACUTE W/ 1V CHEST COMPARISON:  CTA chest abdomen and pelvis 04/04/2016 and earlier. FINDINGS: Seated upright AP view of the chest. Normal lung volumes. Normal cardiac size and mediastinal contours. Visualized tracheal air column is within normal limits. No pneumothorax or pneumoperitoneum. The lungs are clear. Upright and supine views of the abdomen and pelvis. Non obstructed bowel gas pattern. Similar volume of retained stool in the colon compared to the CT on 02/29/2016. Abdominal and pelvic visceral contours are within normal limits. No acute osseous abnormality identified. IMPRESSION: 1.  Normal bowel gas pattern, no free air. 2. Negative chest. Electronically Signed   By: Odessa FlemingH  Hall M.D.   On: 06/10/2016 10:06        Scheduled Meds: . enoxaparin (LOVENOX) injection  40 mg Subcutaneous Q24H  . feeding supplement (GLUCERNA SHAKE)  237  mL Oral TID BM  . FLUoxetine  20 mg Oral Daily  . gabapentin  300 mg Oral TID  . insulin aspart  0-5 Units Subcutaneous QHS  . insulin aspart  0-9 Units Subcutaneous TID WC  . insulin aspart  4 Units Subcutaneous TID WC  . insulin glargine  10 Units Subcutaneous QHS  . pantoprazole  40 mg Oral Daily  . potassium chloride  20 mEq Oral BID   Continuous Infusions: . sodium chloride 100 mL/hr at 06/11/16 1612     LOS: 1 day    Time spent: 25 minutes. Greater than 50% of this time was spent in  direct contact with the patient coordinating care.     Chaya Jan, MD [facility] Pager [phone]  If 7PM-7AM, please contact night-coverage [email or web address] Password [facility] 06/11/2016, 6:09 PM

## 2016-06-11 NOTE — Progress Notes (Addendum)
Initial Nutrition Assessment  DOCUMENTATION CODES:  Severe malnutrition in context of social or environmental circumstances, Underweight   Pt meets criteria for SEVERE MALNUTRITION in the context of Social/Environmental circumstances as evidenced by Severe muscle/fat loss.  INTERVENTION:  D/C ensure  Glucerna Shake po TID, each supplement provides 220 kcal and 10 grams of protein  2X protein/veg at meals.   - pt has poor access to food right now, but Educational Handouts on a DM diet w/ brief explanation for when his food security improves. Addendum-patient is fairly knowledgeable regarding the DM diet. His largest problem is his sugary beverage intake. He admits to drinking large quantity due to the "city water being so dirty". Bottled water should be no more expensive than soda. He admits he can do better in this area.  NUTRITION DIAGNOSIS:  Limited access to food/water related to social / environmental circumstances/no income as evidenced by Pt report of relying on churches for the majority of his meals and the occasionally help from his sister.   GOAL:  Patient will meet greater than or equal to 90% of their needs  MONITOR:  PO intake, Supplement acceptance, Labs  REASON FOR ASSESSMENT:  Malnutrition Screening Tool    ASSESSMENT:  51 y/o male PMHx Uncontrolled DM2 who presents with 4-5 days of n/v with inability to tolerate any PO intake. In ED, found to be in DKA. Admitted for management  The pt reports that, on admission, he hadnt eaten anything in 3 days due to a complete loss of appetite and postprandial vomiting when he would attempt to eat.   At baseline, the pt has very poor access to food. He reports going hungry fairly regularly. He says  He "has no income at all". Per MD note, was recently approved for medicaid 2 days ago. Pt says he is really banking on his Disability to go through and if it doesn't "I wont be in a good spot". He is largely relying on "whatever the  churches give me". His sister apparently also helped him out with groceries occasionally.   He used to weigh 185 lbs. He says his weight loss is due to hyperthyroid, DM, and poor access to food.   At this time, the patient is extremely hungry, which may be partially psychological given his limited access to food outside the hospital. He requests 2x portions. He likes the Glucerna. Ensure also seen at Bed  RD consulted for DM education, however, at this time, patient has very poor access to food in general and sounds to just be getting by on whatever is given to him. Communicated this to MD. Social work may be able to provide assistance. RD did provide handouts for him to read over as he waits for increased food security.  Physical Exam: Displays severe muscle/fat wasting.   Meds: Insulin, KCL, PPI,  Labs: A1C 11.6 (3/22), BGs still 300-450 mg/dl  Diet Order:  Diet Carb Modified Fluid consistency: Thin; Room service appropriate? Yes  Skin:  Reviewed, no issues  Last BM:  3/21  Height:  Ht Readings from Last 1 Encounters:  06/10/16 5\' 9"  (1.753 m)   Weight:  Wt Readings from Last 1 Encounters:  06/10/16 114 lb 3.2 oz (51.8 kg)   Wt Readings from Last 10 Encounters:  06/10/16 114 lb 3.2 oz (51.8 kg)  04/26/16 130 lb 9.6 oz (59.2 kg)  03/25/16 126 lb (57.2 kg)  03/25/16 119 lb (54 kg)  02/28/16 121 lb 7.6 oz (55.1 kg)  12/23/15 129  lb 13.6 oz (58.9 kg)  01/18/15 145 lb (65.8 kg)  12/17/14 121 lb 11.1 oz (55.2 kg)  09/22/14 122 lb 9.2 oz (55.6 kg)  07/25/14 124 lb 9 oz (56.5 kg)   Ideal Body Weight:  72.73 kg  BMI:  Body mass index is 16.86 kg/m.  Estimated Nutritional Needs:  Kcal:  1800-2000 (35-39 kcal/kg bw) Protein:  68-78g (1.3-1.5 g/kg bw) Fluid:  >1.8 L 35 ml/kg bw  EDUCATION NEEDS:  Education needs no appropriate at this time  Christophe LouisNathan Dakotah Heiman RD, LDN, CNSC Clinical Nutrition Pager: [phone] 06/11/2016 1:23 PM

## 2016-06-12 DIAGNOSIS — E1010 Type 1 diabetes mellitus with ketoacidosis without coma: Secondary | ICD-10-CM

## 2016-06-12 LAB — GLUCOSE, CAPILLARY
GLUCOSE-CAPILLARY: 178 mg/dL — AB (ref 65–99)
Glucose-Capillary: 324 mg/dL — ABNORMAL HIGH (ref 65–99)
Glucose-Capillary: 258 mg/dL — ABNORMAL HIGH (ref 65–99)

## 2016-06-12 MED ORDER — PANTOPRAZOLE SODIUM 40 MG PO TBEC: 40.0000 mg | DELAYED_RELEASE_TABLET | Freq: Every day | ORAL | 2 refills | Status: DC

## 2016-06-12 MED ORDER — INSULIN GLARGINE 100 UNIT/ML ~~LOC~~ SOLN: 13.0000 [IU] | Freq: Every day | SUBCUTANEOUS | 11 refills | Status: DC

## 2016-06-12 MED ORDER — INSULIN ASPART 100 UNIT/ML FLEXPEN: 8.0000 [IU] | PEN_INJECTOR | Freq: Three times a day (TID) | SUBCUTANEOUS | 11 refills | Status: DC

## 2016-06-12 NOTE — Progress Notes (Signed)
Patient with orders to DC.  Patient states he's "too weak" to go home and not ready.  Thinks he's "being thrown out".  Patient noticeably agitated and upset.  Explained to patient that he was admitted to the hospital with DKA, but now his sugars have improved.  Patient continues to voice concerns of not being ready to go home.  [facility] to speak with [facility]  MD contacted.  DC order still stands.  AC able to reach case manager and able to get voucher for meds.  DC instructions and RX given to patient.  Educated on med changes and importance of taking meds with little patient acceptance.  Instructed patient to get established with PCP - states he should have insurance now. Sister arrived to take patient home.  Left in NAD. IV removed - WNL
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
## 2016-06-15 ENCOUNTER — Encounter (HOSPITAL_COMMUNITY): Payer: Self-pay | Admitting: Emergency Medicine

## 2016-06-15 ENCOUNTER — Emergency Department (HOSPITAL_COMMUNITY): Payer: Medicaid Other

## 2016-06-15 ENCOUNTER — Emergency Department (HOSPITAL_COMMUNITY)
Admission: EM | Admit: 2016-06-15 | Discharge: 2016-06-15 | Disposition: A | Discharge status: Home / Self Care | Payer: Medicaid Other | Attending: Emergency Medicine | Admitting: Emergency Medicine

## 2016-06-15 DIAGNOSIS — Z794 Long term (current) use of insulin: Secondary | ICD-10-CM | POA: Diagnosis not present

## 2016-06-15 DIAGNOSIS — G8929 Other chronic pain: Secondary | ICD-10-CM | POA: Insufficient documentation

## 2016-06-15 DIAGNOSIS — F17200 Nicotine dependence, unspecified, uncomplicated: Secondary | ICD-10-CM | POA: Insufficient documentation

## 2016-06-15 DIAGNOSIS — G894 Chronic pain syndrome: Secondary | ICD-10-CM

## 2016-06-15 DIAGNOSIS — Z79899 Other long term (current) drug therapy: Secondary | ICD-10-CM | POA: Insufficient documentation

## 2016-06-15 DIAGNOSIS — E1165 Type 2 diabetes mellitus with hyperglycemia: Secondary | ICD-10-CM | POA: Diagnosis present

## 2016-06-15 DIAGNOSIS — R739 Hyperglycemia, unspecified: Secondary | ICD-10-CM

## 2016-06-15 LAB — BLOOD GAS, VENOUS
ACID-BASE EXCESS: 1.8 mmol/L (ref 0.0–2.0)
BICARBONATE: 24.5 mmol/L (ref 20.0–28.0)
Drawn by: 1518
O2 Saturation: 64.3 %
PATIENT TEMPERATURE: 37
PO2 VEN: 35.7 mmHg (ref 32.0–45.0)
pCO2, Ven: 50.9 mmHg (ref 44.0–60.0)
pH, Ven: 7.343 (ref 7.250–7.430)

## 2016-06-15 LAB — CBC WITH DIFFERENTIAL/PLATELET
BASOS ABS: 0 10*3/uL (ref 0.0–0.1)
BASOS PCT: 1 %
Eosinophils Absolute: 0.1 10*3/uL (ref 0.0–0.7)
Eosinophils Relative: 3 %
HEMATOCRIT: 37.7 % — AB (ref 39.0–52.0)
Hemoglobin: 13.5 g/dL (ref 13.0–17.0)
Lymphocytes Relative: 26 %
Lymphs Abs: 1.4 10*3/uL (ref 0.7–4.0)
MCH: 32.2 pg (ref 26.0–34.0)
MCHC: 35.8 g/dL (ref 30.0–36.0)
MCV: 90 fL (ref 78.0–100.0)
MONO ABS: 0.7 10*3/uL (ref 0.1–1.0)
Monocytes Relative: 13 %
Neutro Abs: 3 10*3/uL (ref 1.7–7.7)
Neutrophils Relative %: 57 %
PLATELETS: 193 10*3/uL (ref 150–400)
RBC: 4.19 MIL/uL — ABNORMAL LOW (ref 4.22–5.81)
RDW: 12.1 % (ref 11.5–15.5)
WBC: 5.2 10*3/uL (ref 4.0–10.5)

## 2016-06-15 LAB — URINALYSIS, ROUTINE W REFLEX MICROSCOPIC
BILIRUBIN URINE: NEGATIVE
Bacteria, UA: NONE SEEN
Hgb urine dipstick: NEGATIVE
KETONES UR: NEGATIVE mg/dL
LEUKOCYTES UA: NEGATIVE
NITRITE: NEGATIVE
PH: 7 (ref 5.0–8.0)
Protein, ur: NEGATIVE mg/dL
Specific Gravity, Urine: 1.031 — ABNORMAL HIGH (ref 1.005–1.030)
Squamous Epithelial / LPF: NONE SEEN

## 2016-06-15 LAB — BASIC METABOLIC PANEL
ANION GAP: 10 (ref 5–15)
BUN: 13 mg/dL (ref 6–20)
CALCIUM: 8.7 mg/dL — AB (ref 8.9–10.3)
CO2: 27 mmol/L (ref 22–32)
Chloride: 95 mmol/L — ABNORMAL LOW (ref 101–111)
Creatinine, Ser: 1.05 mg/dL (ref 0.61–1.24)
Glucose, Bld: 554 mg/dL (ref 65–99)
Potassium: 4 mmol/L (ref 3.5–5.1)
SODIUM: 132 mmol/L — AB (ref 135–145)

## 2016-06-15 LAB — CBG MONITORING, ED: Glucose-Capillary: 494 mg/dL — ABNORMAL HIGH (ref 65–99)

## 2016-06-15 MED ORDER — SODIUM CHLORIDE 0.9 % IV SOLN
INTRAVENOUS | Status: AC
  Filled 2016-06-15: qty 2.5

## 2016-06-15 MED ORDER — SODIUM CHLORIDE 0.9 % IV SOLN
INTRAVENOUS | Status: DC
  Administered 2016-06-15: 21:00:00 via INTRAVENOUS

## 2016-06-15 MED ORDER — SODIUM CHLORIDE 0.9 % IV BOLUS (SEPSIS)
1000.0000 mL | Freq: Once | INTRAVENOUS | Status: AC
Start: 2016-06-15 — End: 2016-06-15
  Administered 2016-06-15: 1000 mL via INTRAVENOUS

## 2016-06-15 MED ORDER — TRAMADOL HCL 50 MG PO TABS
50.0000 mg | ORAL_TABLET | Freq: Once | ORAL | Status: AC
  Administered 2016-06-15: 50 mg via ORAL
  Filled 2016-06-15: qty 1

## 2016-06-15 MED ORDER — SODIUM CHLORIDE 0.9 % IV SOLN
999.0000 mL/h | INTRAVENOUS | Status: DC
  Administered 2016-06-15: 999 mL/h via INTRAVENOUS

## 2016-06-15 MED ORDER — SODIUM CHLORIDE 0.9 % IV SOLN
INTRAVENOUS | Status: DC
  Administered 2016-06-15: 4.9 [IU]/h via INTRAVENOUS
  Filled 2016-06-15: qty 2.5

## 2016-06-15 MED ORDER — SODIUM CHLORIDE 0.9 % IV SOLN: INTRAVENOUS | Status: DC

## 2016-06-15 NOTE — Discharge Instructions (Signed)
Follow up with your primary care doctor, continue to monitor your blood sugar closely, take your insulin regularly

## 2016-06-15 NOTE — ED Notes (Signed)
Heard patient screaming out, went to patients room and he was yelling and cursing at Dr. Lynelle DoctorKnapp.  Dr Lynelle DoctorKnapp was attempting to explain something to pt, pt would not allow Dr to talk,  Pt kept saying "take all this shit off me, I want to get the fuck out of here!"  Dr Lynelle DoctorKnapp tried several times to talk to the pt and pt just kept yelling and screaming at th Dr.   Venia Carbonemoved IV and cardiac leads. Pt got dressed and walked out to waiting area

## 2016-06-15 NOTE — ED Triage Notes (Signed)
Pt c/o high blood sugar, vomiting and body aches. Pt was released from hospital recently for the same. Pt has no means to get insulin. Per ems blood sugar was 491.

## 2016-06-15 NOTE — ED Provider Notes (Signed)
[place] DEPT Provider Note   CSN: [id number] Arrival date & time: 06/15/16  1857     History   Chief Complaint Chief Complaint  Patient presents with  . Hyperglycemia    HPI Kenneth [name] is a 51 y.o. male.  HPI Patient presents to the emergency room for evaluation of hyperglycemia. Patient states he has a history of diabetes. He is supposed to be taking insulin but has difficulty affording his medications. He had some old insulin available so he took some this morning but has not been taking it regularly. His sugar was elevated above 400. He has history of having DKA so he came into the emergency room. Patient states he does not have any money. He does not work. He is waiting for disability. He did get approved for Medicaid sleep and does not have everything set up. he was recently admitted to the hospital on the 22nd of this month and was released on the 24th.  She is also complaining of chronic pain in his neck back and shoulders. This is not a new condition for him. He is requesting pain medications. Past Medical History:  Diagnosis Date  . Arthritis   . Diabetes mellitus   . DKA (diabetic ketoacidosis) ([place]) 07/2014  . Malnutrition ([place])   . Noncompliance with medication regimen   . Thyroid disease     Patient Active Problem List   Diagnosis Date Noted  . Protein-calorie malnutrition, severe 06/11/2016  . Chronic generalized pain 04/26/2016  . Neuropathy ([place]) 04/26/2016  . Diabetes mellitus type 2, uncontrolled, with complications ([place])   . MDD (major depressive disorder), recurrent severe, without psychosis ([place]) 03/27/2016  . Depression, recurrent ([place])   . Abdominal pain   . Hyperglycemia 02/24/2016  . Volume depletion 12/21/2015  . Metabolic acidosis 98/92/1194  . Diabetes mellitus with nonketotic hyperosmolarity ([place]) 09/21/2014  . Noncompliance with medication regimen   . Diabetes mellitus ([place])   . DKA (diabetic ketoacidoses) ([place]) 07/24/2014  .  Dehydration 07/24/2014  . Acute renal failure ([place]) 07/24/2014  . Hyperkalemia 07/24/2014  . Hyponatremia 07/24/2014  . Hyperthyroidism 07/24/2014    History reviewed. No pertinent surgical history.     Home Medications    Prior to Admission medications   Medication Sig Start Date End Date Taking? Authorizing Provider  Blood Glucose Monitoring Suppl (TRUE METRIX AIR GLUCOSE METER) w/Device KIT 1 each by Does not apply route 4 (four) times daily -  before meals and at bedtime. 03/25/16   Dorena Dew, FNP  FLUoxetine (PROZAC) 20 MG tablet Take 1 tablet by mouth daily. 04/06/16   Historical Provider, MD  gabapentin (NEURONTIN) 300 MG capsule Take 1 capsule by mouth 3 (three) times daily. 03/25/16   Historical Provider, MD  glucose blood (TRUE METRIX BLOOD GLUCOSE TEST) test strip Use as instructed 03/25/16   Dorena Dew, FNP  hydrOXYzine (ATARAX/VISTARIL) 25 MG tablet Take 1 tablet (25 mg total) by mouth 3 (three) times daily as needed for anxiety. 04/05/16   Derrill Center, NP  insulin aspart (NOVOLOG) 100 UNIT/ML FlexPen Inject 8 Units into the skin 3 (three) times daily with meals. 06/12/16   Erline Hau, MD  insulin glargine (LANTUS) 100 UNIT/ML injection Inject 0.13 mLs (13 Units total) into the skin at bedtime. 06/12/16   Erline Hau, MD  Insulin Pen Needle (BD ULTRA-FINE PEN NEEDLES) 29G X 12.7MM MISC 1 each by Does not apply route 4 (four) times daily. 04/26/16   Lovett Sox  Durenda Guthrie, FNP  pantoprazole (PROTONIX) 40 MG tablet Take 1 tablet (40 mg total) by mouth daily. 06/12/16   Estela Leonie Green, MD    Family History Family History  Problem Relation Age of Onset  . Diabetes Mother   . Seizures Sister   . Diabetes Sister     Social History Social History  Substance Use Topics  . Smoking status: Current Every Day Smoker    Packs/day: 0.50    Years: 8.00  . Smokeless tobacco: Never Used     Comment: states he hasn't smoked since 06/05/16  .  Alcohol use No     Allergies   Metformin and related and Nsaids   Review of Systems Review of Systems   Physical Exam Updated Vital Signs BP 124/78   Pulse 82   Temp 97.9 F (36.6 C) (Oral)   Resp (!) 21   Ht '5\' 9"'$  (1.753 m)   Wt 51.7 kg   SpO2 100%   BMI 16.83 kg/m   Physical Exam  Constitutional: No distress.  HENT:  Head: Normocephalic and atraumatic.  Right Ear: External ear normal.  Left Ear: External ear normal.  Eyes: Conjunctivae are normal. Right eye exhibits no discharge. Left eye exhibits no discharge. No scleral icterus.  Neck: Neck supple. No tracheal deviation present.  Cardiovascular: Normal rate, regular rhythm and intact distal pulses.   Pulmonary/Chest: Effort normal and breath sounds normal. No stridor. No respiratory distress. He has no wheezes. He has no rales.  Abdominal: Soft. Bowel sounds are normal. He exhibits no distension. There is no tenderness. There is no rebound and no guarding.  Musculoskeletal: He exhibits tenderness (diffusely in extremities, no erythema or edema). He exhibits no edema.  Paraspinal ttp, no meningismus, no erythema  Neurological: He is alert. He has normal strength. No cranial nerve deficit (no facial droop, extraocular movements intact, no slurred speech) or sensory deficit. He exhibits normal muscle tone. He displays no seizure activity. Coordination normal.  Skin: Skin is warm and dry. No rash noted. He is not diaphoretic.  Psychiatric: He has a normal mood and affect.  Nursing note and vitals reviewed.    ED Treatments / Results  Labs (all labs ordered are listed, but only abnormal results are displayed) Labs Reviewed  CBC WITH DIFFERENTIAL/PLATELET - Abnormal; Notable for the following:       Result Value   RBC 4.19 (*)    HCT 37.7 (*)    All other components within normal limits  BASIC METABOLIC PANEL - Abnormal; Notable for the following:    Sodium 132 (*)    Chloride 95 (*)    Glucose, Bld 554 (*)     Calcium 8.7 (*)    All other components within normal limits  URINALYSIS, ROUTINE W REFLEX MICROSCOPIC - Abnormal; Notable for the following:    Color, Urine COLORLESS (*)    Specific Gravity, Urine 1.031 (*)    Glucose, UA >=500 (*)    All other components within normal limits  CBG MONITORING, ED - Abnormal; Notable for the following:    Glucose-Capillary 494 (*)    All other components within normal limits  BLOOD GAS, VENOUS     Radiology Dg Chest 2 View  Result Date: 06/15/2016 CLINICAL DATA:  Hyperglycemia EXAM: CHEST  2 VIEW COMPARISON:  06/10/2016 FINDINGS: Cardiomediastinal silhouette is stable. No infiltrate or pleural effusion. No pulmonary edema. Mild degenerative changes mid thoracic spine. IMPRESSION: No active cardiopulmonary disease. Electronically Signed   By:  Lahoma Crocker M.D.   On: 06/15/2016 20:39    Procedures Procedures (including critical care time)  Medications Ordered in ED Medications  insulin regular (NOVOLIN R,HUMULIN R) 250 Units in sodium chloride 0.9 % 250 mL (1 Units/mL) infusion (4.9 Units/hr Intravenous New Bag/Given 06/15/16 2051)  sodium chloride 0.9 % bolus 1,000 mL (0 mLs Intravenous Stopped 06/15/16 2053)    And  0.9 %  sodium chloride infusion ( Intravenous New Bag/Given 06/15/16 2054)  0.9 %  sodium chloride infusion (999 mL/hr Intravenous New Bag/Given 06/15/16 2053)  traMADol (ULTRAM) tablet 50 mg (50 mg Oral Given 06/15/16 1949)     Initial Impression / Assessment and Plan / ED Course  I have reviewed the triage vital signs and the nursing notes.  Pertinent labs & imaging results that were available during my care of the patient were reviewed by me and considered in my medical decision making (see chart for details).  Clinical Course as of Jun 15 2116  Tue Jun 15, 2016  2115 I explained to the patient that his blood tests show elevated blood sugar without signs of DKA. We're going to work on bringing his blood sugar down but he will likely  not have to be admitted to the hospital.  [JK]  2115 Patient became very upset about this. He also states were not doing anything for him including his pain.I told The patient that I did give him a dose of Ultram to help him with his pain. Patient told me that wasn't going to do any good for him.  [JK]  2116 I told him that he might be taking too many pain medications and this could be affecting his tolerance.  Pt became very upset about this.  He refuses to let me speak to him any further.  [JK]    Clinical Course User Index [JK] Dorie Rank, MD    Pt has hyperglycemia without DKA.  No signs of infection.  I wanted to hold the patient in the ED longer to bring his blood sugar down but he refused to stay any longer.  Final Clinical Impressions(s) / ED Diagnoses   Final diagnoses:  Hyperglycemia  Chronic pain syndrome    New Prescriptions New Prescriptions   No medications on file     Dorie Rank, MD 06/15/16 2119

## 2016-06-15 NOTE — ED Notes (Signed)
CRITICAL VALUE ALERT  Critical value received: glucose 554  Date of notification:  06/15/16  Time of notification:  2035  Critical value read back: yes  Nurse who received alert:  Wynona Dovehris Josephyne Tarter RN  MD notified (1st page):  Dr Lynelle DoctorKnapp  Time of first page:  2035  MD notified (2nd page):  Time of second page:  Responding MD:  Dr Lynelle DoctorKnapp  Time MD responded:  2035

## 2016-06-22 ENCOUNTER — Encounter (HOSPITAL_COMMUNITY): Payer: Self-pay | Admitting: Emergency Medicine

## 2016-06-22 ENCOUNTER — Inpatient Hospital Stay (HOSPITAL_COMMUNITY)
Admission: EM | Admit: 2016-06-22 | Discharge: 2016-06-25 | DRG: 637 | Disposition: A | Discharge status: Home / Self Care | Payer: Medicaid Other | Attending: Internal Medicine | Admitting: Internal Medicine

## 2016-06-22 ENCOUNTER — Inpatient Hospital Stay (HOSPITAL_COMMUNITY): Payer: Medicaid Other

## 2016-06-22 DIAGNOSIS — D72829 Elevated white blood cell count, unspecified: Secondary | ICD-10-CM | POA: Diagnosis present

## 2016-06-22 DIAGNOSIS — R079 Chest pain, unspecified: Secondary | ICD-10-CM

## 2016-06-22 DIAGNOSIS — Z888 Allergy status to other drugs, medicaments and biological substances status: Secondary | ICD-10-CM

## 2016-06-22 DIAGNOSIS — G629 Polyneuropathy, unspecified: Secondary | ICD-10-CM

## 2016-06-22 DIAGNOSIS — Z794 Long term (current) use of insulin: Secondary | ICD-10-CM

## 2016-06-22 DIAGNOSIS — Z9114 Patient's other noncompliance with medication regimen: Secondary | ICD-10-CM | POA: Diagnosis not present

## 2016-06-22 DIAGNOSIS — Z886 Allergy status to analgesic agent status: Secondary | ICD-10-CM | POA: Diagnosis not present

## 2016-06-22 DIAGNOSIS — F419 Anxiety disorder, unspecified: Secondary | ICD-10-CM | POA: Diagnosis present

## 2016-06-22 DIAGNOSIS — E1165 Type 2 diabetes mellitus with hyperglycemia: Secondary | ICD-10-CM

## 2016-06-22 DIAGNOSIS — Z681 Body mass index (BMI) 19 or less, adult: Secondary | ICD-10-CM | POA: Diagnosis not present

## 2016-06-22 DIAGNOSIS — E1310 Other specified diabetes mellitus with ketoacidosis without coma: Secondary | ICD-10-CM

## 2016-06-22 DIAGNOSIS — R000 Tachycardia, unspecified: Secondary | ICD-10-CM | POA: Diagnosis present

## 2016-06-22 DIAGNOSIS — E1111 Type 2 diabetes mellitus with ketoacidosis with coma: Secondary | ICD-10-CM

## 2016-06-22 DIAGNOSIS — E875 Hyperkalemia: Secondary | ICD-10-CM | POA: Diagnosis present

## 2016-06-22 DIAGNOSIS — Z79899 Other long term (current) drug therapy: Secondary | ICD-10-CM

## 2016-06-22 DIAGNOSIS — IMO0002 Reserved for concepts with insufficient information to code with codable children: Secondary | ICD-10-CM | POA: Diagnosis present

## 2016-06-22 DIAGNOSIS — E1110 Type 2 diabetes mellitus with ketoacidosis without coma: Secondary | ICD-10-CM | POA: Diagnosis present

## 2016-06-22 DIAGNOSIS — E43 Unspecified severe protein-calorie malnutrition: Secondary | ICD-10-CM

## 2016-06-22 DIAGNOSIS — N179 Acute kidney failure, unspecified: Secondary | ICD-10-CM | POA: Diagnosis present

## 2016-06-22 DIAGNOSIS — Z91148 Patient's other noncompliance with medication regimen for other reason: Secondary | ICD-10-CM

## 2016-06-22 DIAGNOSIS — F17210 Nicotine dependence, cigarettes, uncomplicated: Secondary | ICD-10-CM | POA: Diagnosis present

## 2016-06-22 DIAGNOSIS — F332 Major depressive disorder, recurrent severe without psychotic features: Secondary | ICD-10-CM | POA: Diagnosis not present

## 2016-06-22 DIAGNOSIS — E869 Volume depletion, unspecified: Secondary | ICD-10-CM | POA: Diagnosis present

## 2016-06-22 DIAGNOSIS — E871 Hypo-osmolality and hyponatremia: Secondary | ICD-10-CM | POA: Diagnosis present

## 2016-06-22 DIAGNOSIS — N170 Acute kidney failure with tubular necrosis: Secondary | ICD-10-CM

## 2016-06-22 DIAGNOSIS — Z833 Family history of diabetes mellitus: Secondary | ICD-10-CM | POA: Diagnosis not present

## 2016-06-22 DIAGNOSIS — R112 Nausea with vomiting, unspecified: Secondary | ICD-10-CM | POA: Diagnosis not present

## 2016-06-22 DIAGNOSIS — E118 Type 2 diabetes mellitus with unspecified complications: Secondary | ICD-10-CM

## 2016-06-22 DIAGNOSIS — G894 Chronic pain syndrome: Secondary | ICD-10-CM | POA: Diagnosis present

## 2016-06-22 DIAGNOSIS — E860 Dehydration: Secondary | ICD-10-CM

## 2016-06-22 DIAGNOSIS — E1142 Type 2 diabetes mellitus with diabetic polyneuropathy: Secondary | ICD-10-CM | POA: Diagnosis present

## 2016-06-22 DIAGNOSIS — E1140 Type 2 diabetes mellitus with diabetic neuropathy, unspecified: Secondary | ICD-10-CM | POA: Diagnosis present

## 2016-06-22 DIAGNOSIS — F339 Major depressive disorder, recurrent, unspecified: Secondary | ICD-10-CM

## 2016-06-22 HISTORY — DX: Polyneuropathy, unspecified: G62.9

## 2016-06-22 HISTORY — DX: Other chronic pain: G89.29

## 2016-06-22 HISTORY — DX: Pain, unspecified: R52

## 2016-06-22 LAB — BASIC METABOLIC PANEL
ANION GAP: 14 (ref 5–15)
Anion gap: 31 — ABNORMAL HIGH (ref 5–15)
BUN: 24 mg/dL — AB (ref 6–20)
BUN: 17 mg/dL (ref 6–20)
CALCIUM: 8.8 mg/dL — AB (ref 8.9–10.3)
CHLORIDE: 89 mmol/L — AB (ref 101–111)
CO2: 10 mmol/L — ABNORMAL LOW (ref 22–32)
CO2: 19 mmol/L — ABNORMAL LOW (ref 22–32)
CREATININE: 1.58 mg/dL — AB (ref 0.61–1.24)
Calcium: 9.9 mg/dL (ref 8.9–10.3)
Chloride: 106 mmol/L (ref 101–111)
Creatinine, Ser: 1.09 mg/dL (ref 0.61–1.24)
GFR calc Af Amer: 56 mL/min — ABNORMAL LOW (ref >60)
GFR calc non Af Amer: 49 mL/min — ABNORMAL LOW (ref >60)
GLUCOSE: 772 mg/dL — AB (ref 65–99)
GLUCOSE: 229 mg/dL — AB (ref 65–99)
Potassium: 5.9 mmol/L — ABNORMAL HIGH (ref 3.5–5.1)
Potassium: 4.6 mmol/L (ref 3.5–5.1)
SODIUM: 130 mmol/L — AB (ref 135–145)
Sodium: 139 mmol/L (ref 135–145)

## 2016-06-22 LAB — URINALYSIS, ROUTINE W REFLEX MICROSCOPIC
Bilirubin Urine: NEGATIVE
Glucose, UA: >=500 mg/dL — AB
HGB URINE DIPSTICK: NEGATIVE
Ketones, ur: 80 mg/dL — AB
Leukocytes, UA: NEGATIVE
Nitrite: NEGATIVE
PH: 5 (ref 5.0–8.0)
Protein, ur: NEGATIVE mg/dL
SPECIFIC GRAVITY, URINE: 1.02 (ref 1.005–1.030)
Squamous Epithelial / LPF: NONE SEEN

## 2016-06-22 LAB — CBC
HEMATOCRIT: 46.4 % (ref 39.0–52.0)
Hemoglobin: 16.1 g/dL (ref 13.0–17.0)
MCH: 32.2 pg (ref 26.0–34.0)
MCHC: 34.7 g/dL (ref 30.0–36.0)
MCV: 92.8 fL (ref 78.0–100.0)
PLATELETS: 323 10*3/uL (ref 150–400)
RBC: 5 MIL/uL (ref 4.22–5.81)
RDW: 12.6 % (ref 11.5–15.5)
WBC: 13.8 10*3/uL — AB (ref 4.0–10.5)

## 2016-06-22 LAB — HEPATIC FUNCTION PANEL
ALK PHOS: 88 U/L (ref 38–126)
ALT: 27 U/L (ref 17–63)
AST: 23 U/L (ref 15–41)
Albumin: 4.6 g/dL (ref 3.5–5.0)
Total Bilirubin: 2.2 mg/dL — ABNORMAL HIGH (ref 0.3–1.2)
Total Protein: 8 g/dL (ref 6.5–8.1)

## 2016-06-22 LAB — GLUCOSE, CAPILLARY
GLUCOSE-CAPILLARY: 171 mg/dL — AB (ref 65–99)
GLUCOSE-CAPILLARY: 151 mg/dL — AB (ref 65–99)
GLUCOSE-CAPILLARY: 144 mg/dL — AB (ref 65–99)
Glucose-Capillary: 319 mg/dL — ABNORMAL HIGH (ref 65–99)
Glucose-Capillary: 211 mg/dL — ABNORMAL HIGH (ref 65–99)
Glucose-Capillary: 209 mg/dL — ABNORMAL HIGH (ref 65–99)

## 2016-06-22 LAB — CBG MONITORING, ED
GLUCOSE-CAPILLARY: 592 mg/dL — AB (ref 65–99)
GLUCOSE-CAPILLARY: 462 mg/dL — AB (ref 65–99)
GLUCOSE-CAPILLARY: 464 mg/dL — AB (ref 65–99)

## 2016-06-22 LAB — RAPID URINE DRUG SCREEN, HOSP PERFORMED
Amphetamines: NOT DETECTED
Barbiturates: NOT DETECTED
Benzodiazepines: NOT DETECTED
Cocaine: POSITIVE — AB
OPIATES: NOT DETECTED
Tetrahydrocannabinol: NOT DETECTED

## 2016-06-22 LAB — LIPASE, BLOOD: Lipase: 16 U/L (ref 11–51)

## 2016-06-22 LAB — TROPONIN I

## 2016-06-22 LAB — MAGNESIUM: Magnesium: 2.1 mg/dL (ref 1.7–2.4)

## 2016-06-22 MED ORDER — SODIUM CHLORIDE 0.9 % IV SOLN: INTRAVENOUS | Status: AC

## 2016-06-22 MED ORDER — SODIUM CHLORIDE 0.9 % IV SOLN
INTRAVENOUS | Status: DC
  Administered 2016-06-22: 5.3 [IU]/h via INTRAVENOUS
  Administered 2016-06-22: 2.6 [IU]/h via INTRAVENOUS
  Filled 2016-06-22: qty 2.5

## 2016-06-22 MED ORDER — SODIUM CHLORIDE 0.9 % IV SOLN: INTRAVENOUS | Status: DC

## 2016-06-22 MED ORDER — SODIUM CHLORIDE 0.9 % IV SOLN
INTRAVENOUS | Status: DC
  Administered 2016-06-22 – 2016-06-24 (×3): via INTRAVENOUS

## 2016-06-22 MED ORDER — DEXTROSE-NACL 5-0.45 % IV SOLN
INTRAVENOUS | Status: DC
  Administered 2016-06-22: 20:00:00 via INTRAVENOUS

## 2016-06-22 MED ORDER — DEXTROSE-NACL 5-0.45 % IV SOLN: INTRAVENOUS | Status: DC

## 2016-06-22 MED ORDER — SODIUM CHLORIDE 0.9 % IV BOLUS (SEPSIS): 500.0000 mL | Freq: Once | INTRAVENOUS | Status: DC

## 2016-06-22 MED ORDER — DEXTROSE 50 % IV SOLN: 25.0000 mL | INTRAVENOUS | Status: DC | PRN

## 2016-06-22 MED ORDER — GABAPENTIN 300 MG PO CAPS
300.0000 mg | ORAL_CAPSULE | Freq: Three times a day (TID) | ORAL | Status: DC
  Administered 2016-06-22 – 2016-06-25 (×9): 300 mg via ORAL
  Filled 2016-06-22 (×10): qty 1

## 2016-06-22 MED ORDER — FLUOXETINE HCL 20 MG PO CAPS
20.0000 mg | ORAL_CAPSULE | Freq: Every day | ORAL | Status: DC
  Administered 2016-06-22 – 2016-06-25 (×4): 20 mg via ORAL
  Filled 2016-06-22 (×4): qty 1

## 2016-06-22 MED ORDER — SODIUM CHLORIDE 0.9 % IV BOLUS (SEPSIS)
1000.0000 mL | Freq: Once | INTRAVENOUS | Status: AC
  Administered 2016-06-22: 1000 mL via INTRAVENOUS

## 2016-06-22 MED ORDER — GI COCKTAIL ~~LOC~~
ORAL | Status: AC
  Filled 2016-06-22: qty 30

## 2016-06-22 MED ORDER — HYDROXYZINE HCL 25 MG PO TABS
25.0000 mg | ORAL_TABLET | Freq: Three times a day (TID) | ORAL | Status: DC | PRN
  Filled 2016-06-22: qty 1

## 2016-06-22 MED ORDER — ENOXAPARIN SODIUM 40 MG/0.4ML ~~LOC~~ SOLN
40.0000 mg | SUBCUTANEOUS | Status: DC
  Administered 2016-06-22 – 2016-06-24 (×2): 40 mg via SUBCUTANEOUS
  Filled 2016-06-22 (×3): qty 0.4

## 2016-06-22 MED ORDER — ONDANSETRON HCL 4 MG/2ML IJ SOLN: 4.0000 mg | Freq: Four times a day (QID) | INTRAMUSCULAR | Status: DC | PRN

## 2016-06-22 MED ORDER — GI COCKTAIL ~~LOC~~
30.0000 mL | Freq: Once | ORAL | Status: AC
  Administered 2016-06-22: 30 mL via ORAL

## 2016-06-22 MED ORDER — TRAMADOL HCL 50 MG PO TABS
50.0000 mg | ORAL_TABLET | Freq: Four times a day (QID) | ORAL | Status: DC | PRN
  Administered 2016-06-22 – 2016-06-25 (×10): 50 mg via ORAL
  Filled 2016-06-22 (×10): qty 1

## 2016-06-22 MED ORDER — SODIUM CHLORIDE 0.9 % IV SOLN
INTRAVENOUS | Status: DC
  Administered 2016-06-22 – 2016-06-25 (×5): via INTRAVENOUS

## 2016-06-22 MED ORDER — INSULIN REGULAR BOLUS VIA INFUSION
0.0000 [IU] | Freq: Three times a day (TID) | INTRAVENOUS | Status: DC
  Filled 2016-06-22: qty 10

## 2016-06-22 MED ORDER — SODIUM CHLORIDE 0.9 % IV SOLN
1000.0000 mL | Freq: Once | INTRAVENOUS | Status: AC
  Administered 2016-06-22: 1000 mL via INTRAVENOUS

## 2016-06-22 MED ORDER — PANTOPRAZOLE SODIUM 40 MG PO TBEC
40.0000 mg | DELAYED_RELEASE_TABLET | Freq: Every day | ORAL | Status: DC
  Administered 2016-06-22 – 2016-06-25 (×4): 40 mg via ORAL
  Filled 2016-06-22 (×4): qty 1

## 2016-06-22 NOTE — ED Provider Notes (Signed)
[facility] DEPT Provider Note   CSN: [id number] Arrival date & time: 06/22/16  1222     History   Chief Complaint Chief Complaint  Patient presents with  . Hyperglycemia    HPI Kenneth [name] is a 51 y.o. male.  HPI  Pt was seen at 1440. Per pt and his family, c/o gradual onset and persistence of constant generalized body aches and N/V for the past 3 days. Pt states his CBG has been reading "high" on his machine at home. Pt did not take any insulin today. Denies any change in his usual generalized pain, no abd pain, no diarrhea, no back pain, no CP/SOB, no cough, no fevers, no rash, no black or blood in emesis.   Past Medical History:  Diagnosis Date  . Arthritis   . Chronic generalized pain   . Diabetes mellitus   . DKA (diabetic ketoacidoses) ()   . DKA (diabetic ketoacidosis) ([place]) 07/2014  . Malnutrition ([place])   . Neuropathy ([place])   . Neuropathy ([place])   . Noncompliance with medication regimen   . Thyroid disease     Patient Active Problem List   Diagnosis Date Noted  . Protein-calorie malnutrition, severe 06/11/2016  . Chronic generalized pain 04/26/2016  . Neuropathy ([place]) 04/26/2016  . Diabetes mellitus type 2, uncontrolled, with complications ([place])   . MDD (major depressive disorder), recurrent severe, without psychosis ([place]) 03/27/2016  . Depression, recurrent ([place])   . Abdominal pain   . Hyperglycemia 02/24/2016  . Volume depletion 12/21/2015  . Metabolic acidosis 96/28/3662  . Diabetes mellitus with nonketotic hyperosmolarity ([place]) 09/21/2014  . Noncompliance with medication regimen   . Diabetes mellitus ([place])   . DKA (diabetic ketoacidoses) ([place]) 07/24/2014  . Dehydration 07/24/2014  . Acute renal failure ([place]) 07/24/2014  . Hyperkalemia 07/24/2014  . Hyponatremia 07/24/2014  . Hyperthyroidism 07/24/2014    History reviewed. No pertinent surgical history.     Home Medications    Prior to Admission medications   Medication Sig Start  Date End Date Taking? Authorizing Provider  FLUoxetine (PROZAC) 20 MG tablet Take 1 tablet by mouth daily. 04/06/16  Yes Historical Provider, MD  gabapentin (NEURONTIN) 300 MG capsule Take 1 capsule by mouth 3 (three) times daily. 03/25/16  Yes Historical Provider, MD  glucose blood (TRUE METRIX BLOOD GLUCOSE TEST) test strip Use as instructed 03/25/16  Yes Dorena Dew, FNP  hydrOXYzine (ATARAX/VISTARIL) 25 MG tablet Take 1 tablet (25 mg total) by mouth 3 (three) times daily as needed for anxiety. 04/05/16  Yes Derrill Center, NP  insulin aspart (NOVOLOG) 100 UNIT/ML FlexPen Inject 8 Units into the skin 3 (three) times daily with meals. 06/12/16  Yes Erline Hau, MD  insulin glargine (LANTUS) 100 UNIT/ML injection Inject 0.13 mLs (13 Units total) into the skin at bedtime. 06/12/16  Yes Erline Hau, MD  Insulin Pen Needle (BD ULTRA-FINE PEN NEEDLES) 29G X 12.7MM MISC 1 each by Does not apply route 4 (four) times daily. 04/26/16  Yes Dorena Dew, FNP  pantoprazole (PROTONIX) 40 MG tablet Take 1 tablet (40 mg total) by mouth daily. 06/12/16  Yes Estela Leonie Green, MD  Blood Glucose Monitoring Suppl (TRUE METRIX AIR GLUCOSE METER) w/Device KIT 1 each by Does not apply route 4 (four) times daily -  before meals and at bedtime. 03/25/16   Dorena Dew, FNP    Family History Family History  Problem Relation Age of Onset  . Diabetes Mother   .  Seizures Sister   . Diabetes Sister     Social History Social History  Substance Use Topics  . Smoking status: Current Every Day Smoker    Packs/day: 0.50    Years: 8.00  . Smokeless tobacco: Never Used     Comment: states he hasn't smoked since 06/05/16  . Alcohol use No     Allergies   Metformin and related and Nsaids   Review of Systems Review of Systems ROS: Statement: All systems negative except as marked or noted in the HPI; Constitutional: Negative for fever and chills. +generalized body aches.; ; Eyes:  Negative for eye pain, redness and discharge. ; ; ENMT: Negative for ear pain, hoarseness, nasal congestion, sinus pressure and sore throat. ; ; Cardiovascular: Negative for chest pain, palpitations, diaphoresis, dyspnea and peripheral edema. ; ; Respiratory: Negative for cough, wheezing and stridor. ; ; Gastrointestinal: +N/V. Negative for diarrhea, abdominal pain, blood in stool, hematemesis, jaundice and rectal bleeding. . ; ; Genitourinary: Negative for dysuria, flank pain and hematuria. ; ; Musculoskeletal: Negative for back pain and neck pain. Negative for swelling and trauma.; ; Skin: Negative for pruritus, rash, abrasions, blisters, bruising and skin lesion.; ; Neuro: Negative for headache, lightheadedness and neck stiffness. Negative for weakness, altered level of consciousness, altered mental status, extremity weakness, paresthesias, involuntary movement, seizure and syncope.       Physical Exam Updated Vital Signs BP 117/72   Pulse (!) 104   Temp 97.4 F (36.3 C) (Oral)   Resp (!) 26   Ht 5' 9" (1.753 m)   Wt 114 lb (51.7 kg)   SpO2 100%   BMI 16.83 kg/m   Physical Exam 1445: Physical examination:  Nursing notes reviewed; Vital signs and O2 SAT reviewed;  Constitutional: Well developed, Well nourished, In no acute distress; Head:  Normocephalic, atraumatic; Eyes: EOMI, PERRL, No scleral icterus; ENMT: Mouth and pharynx normal, Mucous membranes dry; Neck: Supple, Full range of motion, No lymphadenopathy; Cardiovascular: Tachycardic rate and rhythm, No gallop; Respiratory: Breath sounds clear & equal bilaterally, No wheezes.  Speaking full sentences with ease, Normal respiratory effort/excursion; Chest: Nontender, Movement normal; Abdomen: Soft, Nontender, Nondistended, Normal bowel sounds; Genitourinary: No CVA tenderness; Extremities: Pulses normal, No tenderness, No edema, No calf edema or asymmetry.; Neuro: AA&Ox3, Major CN grossly intact.  Speech clear. No gross focal motor or  sensory deficits in extremities.; Skin: Color normal, Warm, Dry.   ED Treatments / Results  Labs (all labs ordered are listed, but only abnormal results are displayed)   EKG  EKG Interpretation  Date/Time:  Tuesday June 22 2016 14:58:31 EDT Ventricular Rate:  106 PR Interval:    QRS Duration: 144 QT Interval:  329 QTC Calculation: 437 R Axis:   106 Text Interpretation:  Sinus tachycardia Nonspecific intraventricular conduction delay Anterolateral infarct, old Baseline wander When compared with ECG of 06/15/2016 Rate faster Confirmed by [facility]  MD, Nunzio Cory [phone]) on 06/22/2016 3:22:33 PM       Radiology   Procedures Procedures (including critical care time)  Medications Ordered in ED Medications  dextrose 5 %-0.45 % sodium chloride infusion (not administered)  insulin regular bolus via infusion 0-10 Units (not administered)  insulin regular (NOVOLIN R,HUMULIN R) 250 Units in sodium chloride 0.9 % 250 mL (1 Units/mL) infusion (not administered)  dextrose 50 % solution 25 mL (not administered)  0.9 %  sodium chloride infusion (not administered)  0.9 %  sodium chloride infusion (0 mLs Intravenous Stopped 06/22/16 1449)  sodium chloride 0.9 %  bolus 1,000 mL (1,000 mLs Intravenous New Bag/Given 06/22/16 1448)     Initial Impression / Assessment and Plan / ED Course  I have reviewed the triage vital signs and the nursing notes.  Pertinent labs & imaging results that were available during my care of the patient were reviewed by me and considered in my medical decision making (see chart for details).  MDM Reviewed: previous chart, nursing note and vitals Reviewed previous: labs, ECG and x-ray Interpretation: labs and ECG Total time providing critical care: 30-74 minutes. This excludes time spent performing separately reportable procedures and services. Consults: admitting MD    CRITICAL CARE Performed by: Alfonzo Feller Total critical care time: 35 minutes Critical  care time was exclusive of separately billable procedures and treating other patients. Critical care was necessary to treat or prevent imminent or life-threatening deterioration. Critical care was time spent personally by me on the following activities: development of treatment plan with patient and/or surrogate as well as nursing, discussions with consultants, evaluation of patient's response to treatment, examination of patient, obtaining history from patient or surrogate, ordering and performing treatments and interventions, ordering and review of laboratory studies, ordering and review of radiographic studies, pulse oximetry and re-evaluation of patient's condition.   Results for orders placed or performed during the hospital encounter of 42/68/34  Basic metabolic panel  Result Value Ref Range   Sodium 130 (L) 135 - 145 mmol/L   Potassium 5.9 (H) 3.5 - 5.1 mmol/L   Chloride 89 (L) 101 - 111 mmol/L   CO2 10 (L) 22 - 32 mmol/L   Glucose, Bld 772 (HH) 65 - 99 mg/dL   BUN 24 (H) 6 - 20 mg/dL   Creatinine, Ser 1.58 (H) 0.61 - 1.24 mg/dL   Calcium 9.9 8.9 - 10.3 mg/dL   GFR calc non Af Amer 49 (L) >60 mL/min   GFR calc Af Amer 56 (L) >60 mL/min   Anion gap 31 (H) 5 - 15  CBC  Result Value Ref Range   WBC 13.8 (H) 4.0 - 10.5 K/uL   RBC 5.00 4.22 - 5.81 MIL/uL   Hemoglobin 16.1 13.0 - 17.0 g/dL   HCT 46.4 39.0 - 52.0 %   MCV 92.8 78.0 - 100.0 fL   MCH 32.2 26.0 - 34.0 pg   MCHC 34.7 30.0 - 36.0 g/dL   RDW 12.6 11.5 - 15.5 %   Platelets 323 150 - 400 K/uL  Hepatic function panel  Result Value Ref Range   Total Protein 8.0 6.5 - 8.1 g/dL   Albumin 4.6 3.5 - 5.0 g/dL   AST 23 15 - 41 U/L   ALT 27 17 - 63 U/L   Alkaline Phosphatase 88 38 - 126 U/L   Total Bilirubin 2.2 (H) 0.3 - 1.2 mg/dL   Bilirubin, Direct <0.1 (L) 0.1 - 0.5 mg/dL   Indirect Bilirubin NOT CALCULATED 0.3 - 0.9 mg/dL  Lipase, blood  Result Value Ref Range   Lipase 16 11 - 51 U/L  CBG monitoring, ED  Result Value  Ref Range   Glucose-Capillary >600 (HH) 65 - 99 mg/dL  CBG monitoring, ED  Result Value Ref Range   Glucose-Capillary 592 (HH) 65 - 99 mg/dL   Comment 1 Glucose Stabilizer    Dg Chest 2 View Result Date: 06/15/2016 CLINICAL DATA:  Hyperglycemia EXAM: CHEST  2 VIEW COMPARISON:  06/10/2016 FINDINGS: Cardiomediastinal silhouette is stable. No infiltrate or pleural effusion. No pulmonary edema. Mild degenerative changes mid thoracic spine. IMPRESSION:  No active cardiopulmonary disease. Electronically Signed   By: Lahoma Crocker M.D.   On: 06/15/2016 20:39   Dg Abdomen Acute W/chest Result Date: 06/10/2016 CLINICAL DATA:  51 year old male with abdominal pain nausea and vomiting for 4 days. Not tolerating p.o. Initial encounter. EXAM: DG ABDOMEN ACUTE W/ 1V CHEST COMPARISON:  CTA chest abdomen and pelvis 04/04/2016 and earlier. FINDINGS: Seated upright AP view of the chest. Normal lung volumes. Normal cardiac size and mediastinal contours. Visualized tracheal air column is within normal limits. No pneumothorax or pneumoperitoneum. The lungs are clear. Upright and supine views of the abdomen and pelvis. Non obstructed bowel gas pattern. Similar volume of retained stool in the colon compared to the CT on 02/29/2016. Abdominal and pelvic visceral contours are within normal limits. No acute osseous abnormality identified. IMPRESSION: 1.  Normal bowel gas pattern, no free air. 2. Negative chest. Electronically Signed   By: Genevie Ann M.D.   On: 06/10/2016 10:06    Results for JOHNATON, SONNEBORN (MRN [id number]) as of 06/22/2016 16:44  Ref. Range 06/10/2016 18:17 06/10/2016 22:13 06/15/2016 19:52 06/22/2016 13:28  BUN Latest Ref Range: 6 - 20 mg/dL 20 21 (H) 13 24 (H)  Creatinine Latest Ref Range: 0.61 - 1.24 mg/dL 0.86 1.05 1.05 1.58 (H)    1620:  IVF NS x2L given on arrival for elevated glucose, and IV insulin gtt started. AG elevated; will admit. Dx and testing d/w pt and family.  Questions answered.  Verb understanding,  agreeable to admit.  T/C to [facility] Dr. Carles Collet, case discussed, including:  HPI, pertinent PM/SHx, VS/PE, dx testing, ED course and treatment:  Agreeable to admit, requests he will come to the ED for evaluation.   Final Clinical Impressions(s) / ED Diagnoses   Final diagnoses:  None    New Prescriptions New Prescriptions   No medications on file     Francine Graven, DO 06/24/16 1212

## 2016-06-22 NOTE — ED Triage Notes (Signed)
Patient complaining of generalized body aches and vomiting x 3 days. States his sugar would not register on monitor at home. Patient was treated here recently for same. CBG registers "high" in triage.

## 2016-06-22 NOTE — H&P (Signed)
History and Physical  Kenneth [name]:[id number] DOB: 1965-08-08 DOA: 06/22/2016   PCP: Maricela Curet, MD   Patient coming from: Home  Chief Complaint: N/V and pain all over  HPI:  Kenneth [name] is a 51 y.o. male with medical history of diabetes mellitus type 2, tobacco abuse, major depressive disorder presents with 3 day history of nausea and vomiting. The patient states that he ran out of his Lantus approximately 1-2 weeks prior to this admission. He is only been taking his NovoLog intermittently. The patient has had financial difficulties affording his insulin. The patient was recently discharged from the hospital after a stay from 06/10/2016 through 06/12/2016 for DKA. The patient complains of sharp substernal chest pain for the past 2 days with some associated shortness of breath. He states that his chest pain began when he began having nausea and vomiting. He's had subjective fevers and chills without any headache, neck pain, sore throat, abdominal pain, dysuria, hematuria. He has a chronic cough without hemoptysis or sputum production.  In the emergency department, the patient was started to have a serum glucose of 772 with anion gap of 31. He was afebrile and hemodynamically stable saturating 100% on room air. WBC was 13.8. Urinalysis was negative for pyuria. EKG shows sinus tachycardia with nonspecific T-wave changes. The patient was started on intravenous fluids and IV insulin.  Assessment/Plan: DKA in type 2 DM -patient started on IV insulin with q 1 hour CBG check and q 4 hour BMPs -pt started on aggressive fluid resuscitation -Electrolytes were monitored and repleted -transition to [place] insulin once anion gap closed -diet will be advanced once anion gap closed -06/10/16 HbA1C--11.6  Chronic Pain syndrome -pt has some seeking behavior -tramadol prn pain -Continue gabapentin  AKI -secondary to volume depletion -Based on creatinine 0.6-0.8 -Presenting creatinine  1.58  Hyperkalemia -anticipate improvement with IVF and IV insulin  Atypical Chest pain -likely due to vomiting -cycle troponins -EKG--sinus with nonspecific T wave changes  Tobacco abuse -Tobacco cessation discussed  Leukocytosis -Likely secondary to stress demargination -The patient is afebrile and hemodynamically stable -Am CBC  Anxiety/Depression -continue fluoxetine and Atarax  Severe protein calorie malnutrition -consult nutrition        Past Medical History:  Diagnosis Date  . Arthritis   . Chronic generalized pain   . Diabetes mellitus   . DKA (diabetic ketoacidoses) ([place])   . DKA (diabetic ketoacidosis) ([place]) 07/2014  . Malnutrition ([place])   . Neuropathy ([place])   . Neuropathy ([place])   . Noncompliance with medication regimen   . Thyroid disease    History reviewed. No pertinent surgical history. Social History:  reports that he has been smoking.  He has a 4.00 pack-year smoking history. He has never used smokeless tobacco. He reports that he does not drink alcohol or use drugs.   Family History  Problem Relation Age of Onset  . Diabetes Mother   . Seizures Sister   . Diabetes Sister      Allergies  Allergen Reactions  . Metformin And Related Other (See Comments)    Abdominal cramps and diarrhea  . Nsaids Nausea Only     Prior to Admission medications   Medication Sig Start Date End Date Taking? Authorizing Provider  FLUoxetine (PROZAC) 20 MG tablet Take 1 tablet by mouth daily. 04/06/16  Yes Historical Provider, MD  gabapentin (NEURONTIN) 300 MG capsule Take 1 capsule by mouth 3 (three) times daily. 03/25/16  Yes Historical Provider, MD  glucose blood (TRUE METRIX BLOOD GLUCOSE TEST) test strip Use as instructed 03/25/16  Yes Dorena Dew, FNP  hydrOXYzine (ATARAX/VISTARIL) 25 MG tablet Take 1 tablet (25 mg total) by mouth 3 (three) times daily as needed for anxiety. 04/05/16  Yes Derrill Center, NP  insulin aspart (NOVOLOG) 100 UNIT/ML FlexPen  Inject 8 Units into the skin 3 (three) times daily with meals. 06/12/16  Yes Erline Hau, MD  insulin glargine (LANTUS) 100 UNIT/ML injection Inject 0.13 mLs (13 Units total) into the skin at bedtime. 06/12/16  Yes Erline Hau, MD  Insulin Pen Needle (BD ULTRA-FINE PEN NEEDLES) 29G X 12.7MM MISC 1 each by Does not apply route 4 (four) times daily. 04/26/16  Yes Dorena Dew, FNP  pantoprazole (PROTONIX) 40 MG tablet Take 1 tablet (40 mg total) by mouth daily. 06/12/16  Yes Estela Leonie Green, MD  Blood Glucose Monitoring Suppl (TRUE METRIX AIR GLUCOSE METER) w/Device KIT 1 each by Does not apply route 4 (four) times daily -  before meals and at bedtime. 03/25/16   Dorena Dew, FNP    Review of Systems:  Constitutional:  No weight loss, night sweats, Head&Eyes: No headache.  No vision loss.  No eye pain or scotoma ENT:  No Difficulty swallowing,Tooth/dental problems,Sore throat,  No ear ache, post nasal drip,  Cardio-vascular:  No  Orthopnea, PND, swelling in lower extremities,  dizziness, palpitations  GI:  No  abdominal pain,  diarrhea,  hematochezia, melena, heartburn, indigestion, Resp:  No coughing up of blood .No wheezing.No chest wall deformity  Skin:  no rash or lesions.  GU:  no dysuria, change in color of urine, no urgency or frequency. No flank pain.  Musculoskeletal:  No joint pain or swelling. No decreased range of motion. No back pain.  Psych:  No change in mood or affect. No depression or anxiety. Neurologic: No headache, no dysesthesia, no focal weakness. No syncope  Physical Exam: Vitals:   06/22/16 1515 06/22/16 1530 06/22/16 1545 06/22/16 1615  BP:      Pulse: (!) 102 (!) 112 (!) 106 (!) 105  Resp: _0 Temp:      TempSrc:      SpO2: 100% 100% 99% 100%  Weight:      Height:       General:  A&O x 3, NAD, nontoxic, pleasant/cooperative Head/Eye: No conjunctival hemorrhage, no icterus, [place]/AT, No nystagmus ENT:   No icterus,  No thrush, good dentition, no pharyngeal exudate Neck:  No masses, no lymphadenpathy, no bruits CV:  RRR, no rub, no gallop, no S3 Lung:  Diminished breath sounds at the bases. No wheezing. Good air movement. Abdomen: soft/NT, +BS, nondistended, no peritoneal signs Ext: No cyanosis, No rashes, No petechiae, No lymphangitis, No edema Neuro: CNII-XII intact, strength 4/5 in bilateral upper and lower extremities, no dysmetria  Labs on Admission:  Basic Metabolic Panel:  Recent Labs Lab 06/15/16 1952 06/22/16 1328  NA 132* 130*  K 4.0 5.9*  CL 95* 89*  CO2 27 10*  GLUCOSE 554* 772*  BUN 13 24*  CREATININE 1.05 1.58*  CALCIUM 8.7* 9.9   Liver Function Tests:  Recent Labs Lab 06/22/16 1328  AST 23  ALT 27  ALKPHOS 88  BILITOT 2.2*  PROT 8.0  ALBUMIN 4.6    Recent Labs Lab 06/22/16 1328  LIPASE 16   No results for input(s): AMMONIA in the last 168 hours. CBC:  Recent Labs Lab  06/15/16 1952 06/22/16 1328  WBC 5.2 13.8*  NEUTROABS 3.0  --   HGB 13.5 16.1  HCT 37.7* 46.4  MCV 90.0 92.8  PLT 193 323   Coagulation Profile: No results for input(s): INR, PROTIME in the last 168 hours. Cardiac Enzymes: No results for input(s): CKTOTAL, CKMB, CKMBINDEX, TROPONINI in the last 168 hours. BNP: Invalid input(s): POCBNP CBG:  Recent Labs Lab 06/15/16 1956 06/22/16 1257 06/22/16 1548 06/22/16 1644  GLUCAP 494* >600* 592* 462*   Urine analysis:    Component Value Date/Time   COLORURINE STRAW (A) 06/22/2016 1259   APPEARANCEUR CLEAR 06/22/2016 1259   LABSPEC 1.020 06/22/2016 1259   PHURINE 5.0 06/22/2016 1259   GLUCOSEU >=500 (A) 06/22/2016 1259   HGBUR NEGATIVE 06/22/2016 1259   BILIRUBINUR NEGATIVE 06/22/2016 1259   KETONESUR 80 (A) 06/22/2016 1259   PROTEINUR NEGATIVE 06/22/2016 1259   UROBILINOGEN 0.2 01/18/2015 2300   NITRITE NEGATIVE 06/22/2016 1259   LEUKOCYTESUR NEGATIVE 06/22/2016 1259   Sepsis  Labs: _0 (procalcitonin:4,lacticidven:4) )No results found for this or any previous visit (from the past 240 hour(s)).   Radiological Exams on Admission: No results found.  EKG: Independently reviewed. Sinus rhythm, nonspecific T-wave changes    Time spent:60 minutes Code Status:   FULL Family Communication Sinus Disposition Plan: expect 2-3 day hospitalization Consults called: none DVT Prophylaxis: [place] Lovenox  Ether Wolters, DO  [facility] Pager [phone]  If 7PM-7AM, please contact night-coverage [email or web address] Password [facility] 06/22/2016, 4:58 PM

## 2016-06-22 NOTE — ED Notes (Signed)
ED Provider at bedside. 

## 2016-06-22 NOTE — ED Notes (Signed)
CRITICAL VALUE ALERT  Critical value received:  Glucose 772  Date of notification:  06/22/16  Time of notification:  1409  Critical value read back:Yes.    Nurse who received alert:  Berdine Dance RN  MD notified (1st page):  Clarene Duke  Time of first page:  1409  MD notified (2nd page):  Time of second page:  Responding MD:  Clarene Duke  Time MD responded:  1409

## 2016-06-23 LAB — BASIC METABOLIC PANEL
Anion gap: 7 (ref 5–15)
Anion gap: 8 (ref 5–15)
BUN: 14 mg/dL (ref 6–20)
BUN: 16 mg/dL (ref 6–20)
CALCIUM: 9 mg/dL (ref 8.9–10.3)
CHLORIDE: 106 mmol/L (ref 101–111)
CO2: 23 mmol/L (ref 22–32)
CO2: 26 mmol/L (ref 22–32)
CREATININE: 0.73 mg/dL (ref 0.61–1.24)
CREATININE: 0.84 mg/dL (ref 0.61–1.24)
Calcium: 8.7 mg/dL — ABNORMAL LOW (ref 8.9–10.3)
Chloride: 102 mmol/L (ref 101–111)
GFR calc Af Amer: >60 mL/min (ref >60)
GFR calc non Af Amer: >60 mL/min (ref >60)
GLUCOSE: 238 mg/dL — AB (ref 65–99)
Glucose, Bld: 125 mg/dL — ABNORMAL HIGH (ref 65–99)
Potassium: 3.9 mmol/L (ref 3.5–5.1)
Potassium: 4.1 mmol/L (ref 3.5–5.1)
SODIUM: 132 mmol/L — AB (ref 135–145)
Sodium: 140 mmol/L (ref 135–145)

## 2016-06-23 LAB — GLUCOSE, CAPILLARY
GLUCOSE-CAPILLARY: 102 mg/dL — AB (ref 65–99)
GLUCOSE-CAPILLARY: 198 mg/dL — AB (ref 65–99)
GLUCOSE-CAPILLARY: 277 mg/dL — AB (ref 65–99)
GLUCOSE-CAPILLARY: 522 mg/dL — AB (ref 65–99)
GLUCOSE-CAPILLARY: 98 mg/dL (ref 65–99)
Glucose-Capillary: 139 mg/dL — ABNORMAL HIGH (ref 65–99)
Glucose-Capillary: 99 mg/dL (ref 65–99)
Glucose-Capillary: 324 mg/dL — ABNORMAL HIGH (ref 65–99)

## 2016-06-23 LAB — CBC
HEMATOCRIT: 36.4 % — AB (ref 39.0–52.0)
HEMOGLOBIN: 13.2 g/dL (ref 13.0–17.0)
MCH: 32 pg (ref 26.0–34.0)
MCHC: 36.3 g/dL — AB (ref 30.0–36.0)
MCV: 88.3 fL (ref 78.0–100.0)
Platelets: 250 10*3/uL (ref 150–400)
RBC: 4.12 MIL/uL — ABNORMAL LOW (ref 4.22–5.81)
RDW: 12.5 % (ref 11.5–15.5)
WBC: 9.4 10*3/uL (ref 4.0–10.5)

## 2016-06-23 LAB — TROPONIN I

## 2016-06-23 LAB — GLUCOSE, RANDOM: GLUCOSE: 500 mg/dL — AB (ref 65–99)

## 2016-06-23 MED ORDER — INSULIN ASPART 100 UNIT/ML ~~LOC~~ SOLN
0.0000 [IU] | SUBCUTANEOUS | Status: DC
  Administered 2016-06-23: 2 [IU] via SUBCUTANEOUS
  Administered 2016-06-23: 8 [IU] via SUBCUTANEOUS
  Administered 2016-06-23: 2 [IU] via SUBCUTANEOUS
  Administered 2016-06-23: 3 [IU] via SUBCUTANEOUS
  Administered 2016-06-23: 11 [IU] via SUBCUTANEOUS
  Administered 2016-06-23: 15 [IU] via SUBCUTANEOUS
  Administered 2016-06-24: 5 [IU] via SUBCUTANEOUS
  Administered 2016-06-24: 8 [IU] via SUBCUTANEOUS
  Administered 2016-06-24: 11 [IU] via SUBCUTANEOUS
  Administered 2016-06-24: 5 [IU] via SUBCUTANEOUS
  Administered 2016-06-25: 15 [IU] via SUBCUTANEOUS
  Administered 2016-06-25: 5 [IU] via SUBCUTANEOUS

## 2016-06-23 MED ORDER — GLUCERNA SHAKE PO LIQD
237.0000 mL | Freq: Three times a day (TID) | ORAL | Status: DC
  Administered 2016-06-23 – 2016-06-25 (×7): 237 mL via ORAL

## 2016-06-23 MED ORDER — INSULIN ASPART 100 UNIT/ML ~~LOC~~ SOLN
6.0000 [IU] | Freq: Three times a day (TID) | SUBCUTANEOUS | Status: DC
  Administered 2016-06-23 – 2016-06-24 (×4): 6 [IU] via SUBCUTANEOUS

## 2016-06-23 MED ORDER — INSULIN GLARGINE 100 UNIT/ML ~~LOC~~ SOLN
10.0000 [IU] | Freq: Every day | SUBCUTANEOUS | Status: DC
  Administered 2016-06-23: 10 [IU] via SUBCUTANEOUS
  Filled 2016-06-23: qty 0.1

## 2016-06-23 MED ORDER — ADULT MULTIVITAMIN W/MINERALS CH
1.0000 | ORAL_TABLET | Freq: Every day | ORAL | Status: DC
  Administered 2016-06-23 – 2016-06-25 (×3): 1 via ORAL
  Filled 2016-06-23 (×3): qty 1

## 2016-06-23 MED ORDER — INSULIN GLARGINE 100 UNIT/ML ~~LOC~~ SOLN
10.0000 [IU] | Freq: Every day | SUBCUTANEOUS | Status: DC
  Administered 2016-06-23: 10 [IU] via SUBCUTANEOUS
  Filled 2016-06-23 (×2): qty 0.1

## 2016-06-23 MED ORDER — INSULIN GLARGINE 100 UNIT/ML ~~LOC~~ SOLN
14.0000 [IU] | Freq: Every day | SUBCUTANEOUS | Status: DC
  Filled 2016-06-23 (×2): qty 0.14

## 2016-06-23 NOTE — Progress Notes (Signed)
Inpatient Diabetes Program Recommendations  AACE/ADA: New Consensus Statement on Inpatient Glycemic Control (2015)  Target Ranges:  Prepandial:   less than 140 mg/dL      Peak postprandial:   less than 180 mg/dL (1-2 hours)      Critically ill patients:  140 - 180 mg/dL  Results for JOURDAN, DURBIN (MRN [id number]) as of 06/23/2016 09:22  Ref. Range 06/23/2016 00:29 06/23/2016 01:27 06/23/2016 02:09 06/23/2016 04:19 06/23/2016 09:04  Glucose-Capillary Latest Ref Range: 65 - 99 mg/dL 98 621 (H) 308 (H) 657 (H) 198 (H)   Results for LINWOOD, GULLIKSON (MRN [id number]) as of 06/23/2016 09:22  Ref. Range 06/22/2016 12:57 06/22/2016 15:48 06/22/2016 16:44 06/22/2016 16:56 06/22/2016 18:17 06/22/2016 19:27 06/22/2016 20:22 06/22/2016 21:23 06/22/2016 22:21 06/22/2016 23:20  Glucose-Capillary Latest Ref Range: 65 - 99 mg/dL >841 (HH) 324 (HH) 401 (H) 464 (H) 319 (H) 211 (H) 209 (H) 171 (H) 151 (H) 144 (H)   Review of Glycemic Control  Diabetes history: DM Outpatient Diabetes medications: Lantus 13 units QHS, Novolog 8 units TID with meals Current orders for Inpatient glycemic control: Lantus 10 units QHS, Novolog 6 units TID with meals for meal coverage, Novolog 0-15 units Q4H  Inpatient Diabetes Program Recommendations: Insulin - Basal: Patient has Lantus 10 units QHS ordered. However, in reveiwing chart noted patient received Lantus 10 units at 1:12 am and Lantus 10 units at 3:00 am on 06/23/16 when transitioned off IV insulin drip.  Correction (SSI): Please consider decreasing Novolog correction scale to sensitive 0-9 units and consider changing frequency to TID with meals and order Novolog 0-5 units QHS if patient is eating.  NOTE: In reviewing chart, noted patient was admitted from 06/10/16 to 06/12/16 and this diabetes coordinator talked with patient on 06/11/16. Patient has Medicaid and medications should cost him no more than $4 each. Per H&P, noted patient ran out of Lantus 1-2 weeks ago because he could not afford his insulin.    Thanks, Orlando Penner, RN, MSN, CDE Diabetes Coordinator Inpatient Diabetes Program [phone] (Team Pager from 8am to 5pm)

## 2016-06-23 NOTE — Progress Notes (Signed)
Anion gap narrowed , decent glycemic control. Pt. Eating  Kenneth [name]:[id number] DOB: August 30, 1965 DOA: 06/22/2016 PCP: Isabella Stalling, MD   Physical Exam: Blood pressure 120/74, pulse 95, temperature 98.1 F (36.7 C), temperature source Oral, resp. rate (!) 21, height  (1.753 m), weight 53.4 kg (117 lb 11.6 oz), SpO2 100 %.lungs clear. Heart Reg Rhythm. No s3 s4 No HT O R . abd- soft nt , bs NL.  Investigations:  No results found for this or any previous visit (from the past 240 hour(s)).   Basic Metabolic Panel:  Recent Labs  81/19/14 1323  06/22/16 2342 06/23/16 0529  NA  --   < > 140 132*  K  --   < > 4.1 3.9  CL  --   < > 106 102  CO2  --   < > 26 23  GLUCOSE  --   < > 125* 238*  BUN  --   < > 16 14  CREATININE  --   < > 0.84 0.73  CALCIUM  --   < > 9.0 8.7*  MG 2.1  --   --   --   < > = values in this interval not displayed. Liver Function Tests:  Recent Labs  06/22/16 1328  AST 23  ALT 27  ALKPHOS 88  BILITOT 2.2*  PROT 8.0  ALBUMIN 4.6     CBC:  Recent Labs  06/22/16 1328 06/23/16 0529  WBC 13.8* 9.4  HGB 16.1 13.2  HCT 46.4 36.4*  MCV 92.8 88.3  PLT 323 250    Dg Chest Port 1 View  Result Date: 06/22/2016 CLINICAL DATA:  Chest pain and shortness of breath EXAM: PORTABLE CHEST 1 VIEW COMPARISON:  June 15, 2016 FINDINGS: There is no edema or consolidation. Heart size and pulmonary vascularity are normal. No adenopathy. No pneumothorax. There is degenerative change in each shoulder. IMPRESSION: No edema or consolidation. Electronically Signed   By: Bretta Bang III M.D.   On: 06/22/2016 20:55      Medications Impression:  Active Problems:   Acute renal failure (HCC)   Hyperkalemia   Hyponatremia   MDD (major depressive disorder), recurrent severe, without psychosis (HCC)   Diabetes mellitus type 2, uncontrolled, with complications (HCC)   Protein-calorie malnutrition, severe   DKA, type 2 (HCC)     Plan:increase lantus  to 14 units H.S. Bmet x 3 days . Novolog 6 units ac tid.  Consultants:    Procedures   Antibiotics:          Time spent:30 minutes   LOS: 1 day   Kenneth [name] M   06/23/2016, 11:31 AM

## 2016-06-23 NOTE — Progress Notes (Signed)
Initial Nutrition Assessment  DOCUMENTATION CODES:   Severe malnutrition in context of social or environmental circumstances, Underweight  INTERVENTION:  Glucerna Shake po TID, each supplement provides 220 kcal and 10 grams of protein    NUTRITION DIAGNOSIS:   Malnutrition (severe) related to social / environmental circumstances (limited food medication access for diabetes mangagement) as evidenced by severe depletion of muscle mass, severe depletion of body fat.   GOAL:   Patient will meet greater than or equal to 90% of their needs   MONITOR:   PO intake, Supplement acceptance, Weight trends, Labs  REASON FOR ASSESSMENT:   Consult Assessment of nutrition requirement/status  ASSESSMENT:  Kenneth [name] has hx of poor controlled diabetes related to his reported lack of financial resources and inability to work. Hospitalized in late March for DKA and presents with again with DKA, c/o nausea and vomiting 3-4 days prior to admission and has not been taking his mediations as perscribed. His most recent A1C-11.6% 06/10/16.   His home diet is regular and he "eats whatever he can get". Pt says his medicaid was approved so he'll be able to get his medicines now. RD asked him applying for EBT through [employer] but he says he is blocked from receiving due to something that happened years ago. He is asking for "food vouchers" -encouraged him to discuss with case manager or social workers. He is not a good candidate for traditional diet education given his current living conditions.  His appetite is good today >75% of breakfast consumed and he is asking for Glucerna Shakes.   Recent Labs Lab 06/22/16 1323  06/22/16 1919 06/22/16 2342 06/23/16 0529  NA  --   < > 139 140 132*  K  --   < > 4.6 4.1 3.9  CL  --   < > 106 106 102  CO2  --   < > 19* 26 23  BUN  --   < > CREATININE  --   < > 1.09 0.84 0.73  CALCIUM  --   < > 8.8* 9.0 8.7*  MG 2.1  --   --   --   --    GLUCOSE  --   < > 229* 125* 238*  < > = values in this interval not displayed.  CBG (last 3)   Recent Labs  06/23/16 0209 06/23/16 0419 06/23/16 0904  GLUCAP 139* 277* 198*    Labs and Meds- reviewed.   Nutrition-Focused physical exam completed. Findings are orbial and thoracic fat depletion, severe clavicle and scapular muscle depletion, and no edema.     Diet Order:  Diet Carb Modified Fluid consistency: Thin; Room service appropriate? Yes  Skin:   intact  Last BM:   06/21/16  Height:   Ht Readings from Last 1 Encounters:  06/22/16  (1.753 m)    Weight:   Wt Readings from Last 1 Encounters:  06/23/16 117 lb 11.6 oz (53.4 kg)    Ideal Body Weight:  73 kg  BMI:  Body mass index is 17.39 kg/m.  Estimated Nutritional Needs:   Kcal:  9604-5409 (35-38 kcal/kg)  Protein:  69-75 kg (1.3-1.4 gr/kg)  Fluid:  >1600 ml daily  EDUCATION NEEDS: none identified today. His circumstances limiting his food access have not changed since RD assessed/educated him on 06/11/16. See note to reference.    Kenneth Shivers MS,RD,CSG,LDN Office: [phone] Pager: [phone]

## 2016-06-23 NOTE — Progress Notes (Signed)
Dr Janna Arch  upddated on CBG =522. RX'd 15units regular plus 6 units regular for meal coverage.

## 2016-06-24 ENCOUNTER — Ambulatory Visit: Payer: Self-pay | Admitting: Family Medicine

## 2016-06-24 LAB — BASIC METABOLIC PANEL
ANION GAP: 7 (ref 5–15)
BUN: 14 mg/dL (ref 6–20)
CO2: 27 mmol/L (ref 22–32)
Calcium: 8.2 mg/dL — ABNORMAL LOW (ref 8.9–10.3)
Chloride: 100 mmol/L — ABNORMAL LOW (ref 101–111)
Creatinine, Ser: 0.66 mg/dL (ref 0.61–1.24)
GFR calc Af Amer: >60 mL/min (ref >60)
GLUCOSE: 189 mg/dL — AB (ref 65–99)
POTASSIUM: 3.5 mmol/L (ref 3.5–5.1)
Sodium: 134 mmol/L — ABNORMAL LOW (ref 135–145)

## 2016-06-24 LAB — GLUCOSE, CAPILLARY
GLUCOSE-CAPILLARY: 131 mg/dL — AB (ref 65–99)
GLUCOSE-CAPILLARY: 239 mg/dL — AB (ref 65–99)
GLUCOSE-CAPILLARY: 275 mg/dL — AB (ref 65–99)
GLUCOSE-CAPILLARY: 346 mg/dL — AB (ref 65–99)
Glucose-Capillary: 179 mg/dL — ABNORMAL HIGH (ref 65–99)
Glucose-Capillary: 204 mg/dL — ABNORMAL HIGH (ref 65–99)
Glucose-Capillary: 239 mg/dL — ABNORMAL HIGH (ref 65–99)
Glucose-Capillary: 300 mg/dL — ABNORMAL HIGH (ref 65–99)

## 2016-06-24 MED ORDER — INSULIN GLARGINE 100 UNIT/ML ~~LOC~~ SOLN
22.0000 [IU] | Freq: Every day | SUBCUTANEOUS | Status: DC
  Administered 2016-06-24: 22 [IU] via SUBCUTANEOUS
  Filled 2016-06-24 (×4): qty 0.22

## 2016-06-24 MED ORDER — INSULIN ASPART 100 UNIT/ML ~~LOC~~ SOLN
12.0000 [IU] | Freq: Three times a day (TID) | SUBCUTANEOUS | Status: DC
  Administered 2016-06-24 – 2016-06-25 (×2): 12 [IU] via SUBCUTANEOUS

## 2016-06-24 MED ORDER — INSULIN ASPART 100 UNIT/ML ~~LOC~~ SOLN
10.0000 [IU] | Freq: Three times a day (TID) | SUBCUTANEOUS | Status: DC
Start: 2016-06-24 — End: 2016-06-24

## 2016-06-24 NOTE — Progress Notes (Signed)
Pt is noncompliant with diet , insulin regimen.

## 2016-06-24 NOTE — Progress Notes (Signed)
Inpatient Diabetes Program Recommendations  AACE/ADA: New Consensus Statement on Inpatient Glycemic Control (2015)  Target Ranges:  Prepandial:   less than 140 mg/dL      Peak postprandial:   less than 180 mg/dL (1-2 hours)      Critically ill patients:  140 - 180 mg/dL   Results for EDDRICK, DILONE (MRN [id number]) as of 06/24/2016 09:00  Ref. Range 06/23/2016 02:09 06/23/2016 04:19 06/23/2016 09:04 06/23/2016 11:39 06/23/2016 17:06 06/23/2016 19:43 06/23/2016 23:45 06/24/2016 01:26 06/24/2016 04:12 06/24/2016 07:14  Glucose-Capillary Latest Ref Range: 65 - 99 mg/dL 409 (H) 811 (H) 914 (H) 99 522 (HH) 324 (H) 131 (H) 300 (H) 204 (H) 275 (H)   Review of Glycemic Control  Diabetes history: DM Outpatient Diabetes medications: Lantus 13 units QHS, Novolog 8 units TID with meals Current orders for Inpatient glycemic control: Lantus 14 units QHS, Novolog 6 units TID with meals for meal coverage, Novolog 0-15 units Q4H  Inpatient Diabetes Program Recommendations:  Insulin - Basal: Noted patient refused Lantus last night. Please increase and reorder Lantus for 20 units daily (to be given now). Insulin - Meal Coverage: Please consider increasing meal coverage to Novolog 10 units TID with meals.  Thanks, Orlando Penner, RN, MSN, CDE Diabetes Coordinator Inpatient Diabetes Program [phone] (Team Pager from 8am to 5pm)

## 2016-06-24 NOTE — Progress Notes (Signed)
Eating less glycemic control will increase long and short acting insulins  Kenneth [name] VZC:[id number] DOB: 1966-02-10 DOA: 06/22/2016 PCP: Isabella Stalling, MD   Physical Exam: Blood pressure 112/73, pulse 81, temperature 98.9 F (37.2 C), temperature source Oral, resp. rate 20, height  (1.753 m), weight 57.9 kg (127 lb 10.3 oz), SpO2 99 %.lungs- clear. Heart RR no S3 S$ No H T R . Abd- Soft NT BS NL   Investigations:  No results found for this or any previous visit (from the past 240 hour(s)).   Basic Metabolic Panel:  Recent Labs  12/87/86 1323  06/23/16 0529 06/23/16 1743 06/24/16 0430  NA  --   < > 132*  --  134*  K  --   < > 3.9  --  3.5  CL  --   < > 102  --  100*  CO2  --   < > 23  --  27  GLUCOSE  --   < > 238* 500* 189*  BUN  --   < > 14  --  14  CREATININE  --   < > 0.73  --  0.66  CALCIUM  --   < > 8.7*  --  8.2*  MG 2.1  --   --   --   --   < > = values in this interval not displayed. Liver Function Tests:  Recent Labs  06/22/16 1328  AST 23  ALT 27  ALKPHOS 88  BILITOT 2.2*  PROT 8.0  ALBUMIN 4.6     CBC:  Recent Labs  06/22/16 1328 06/23/16 0529  WBC 13.8* 9.4  HGB 16.1 13.2  HCT 46.4 36.4*  MCV 92.8 88.3  PLT 323 250    Dg Chest Port 1 View  Result Date: 06/22/2016 CLINICAL DATA:  Chest pain and shortness of breath EXAM: PORTABLE CHEST 1 VIEW COMPARISON:  June 15, 2016 FINDINGS: There is no edema or consolidation. Heart size and pulmonary vascularity are normal. No adenopathy. No pneumothorax. There is degenerative change in each shoulder. IMPRESSION: No edema or consolidation. Electronically Signed   By: Bretta Bang III M.D.   On: 06/22/2016 20:55      Medications:  Impression: Active Problems:   Acute renal failure (HCC)   Hyperkalemia   Hyponatremia   MDD (major depressive disorder), recurrent severe, without psychosis (HCC)   Diabetes mellitus type 2, uncontrolled, with complications (HCC)   Protein-calorie  malnutrition, severe   DKA, type 2 (HCC)     Plan:Increase Lantus to 22 units HS. Increase Noviolog to 12 units AC TID basal plus coverage  Consultants:    Procedures   Antibiotics        Time spent:30   LOS: 2 days   Ramzi Brathwaite M   06/24/2016, 12:34 PM

## 2016-06-25 LAB — BASIC METABOLIC PANEL
Anion gap: 7 (ref 5–15)
BUN: 14 mg/dL (ref 6–20)
CALCIUM: 8.2 mg/dL — AB (ref 8.9–10.3)
CO2: 31 mmol/L (ref 22–32)
CREATININE: 0.64 mg/dL (ref 0.61–1.24)
Chloride: 96 mmol/L — ABNORMAL LOW (ref 101–111)
GFR calc Af Amer: >60 mL/min (ref >60)
GFR calc non Af Amer: >60 mL/min (ref >60)
GLUCOSE: 357 mg/dL — AB (ref 65–99)
Potassium: 4 mmol/L (ref 3.5–5.1)
Sodium: 134 mmol/L — ABNORMAL LOW (ref 135–145)

## 2016-06-25 LAB — GLUCOSE, CAPILLARY
Glucose-Capillary: 333 mg/dL — ABNORMAL HIGH (ref 65–99)
Glucose-Capillary: 356 mg/dL — ABNORMAL HIGH (ref 65–99)
Glucose-Capillary: 207 mg/dL — ABNORMAL HIGH (ref 65–99)

## 2016-06-25 MED ORDER — INSULIN ASPART 100 UNIT/ML ~~LOC~~ SOLN: 16.0000 [IU] | Freq: Three times a day (TID) | SUBCUTANEOUS | 11 refills | Status: DC

## 2016-06-25 MED ORDER — INSULIN ASPART 100 UNIT/ML ~~LOC~~ SOLN
16.0000 [IU] | Freq: Three times a day (TID) | SUBCUTANEOUS | Status: DC
  Administered 2016-06-25: 16 [IU] via SUBCUTANEOUS

## 2016-06-25 MED ORDER — LISINOPRIL 10 MG PO TABS: 10.0000 mg | ORAL_TABLET | Freq: Every day | ORAL | 2 refills | Status: DC

## 2016-06-25 MED ORDER — INSULIN GLARGINE 100 UNIT/ML ~~LOC~~ SOLN
30.0000 [IU] | Freq: Every day | SUBCUTANEOUS | Status: DC
  Filled 2016-06-25 (×3): qty 0.3

## 2016-06-25 MED ORDER — HYDROXYZINE HCL 25 MG PO TABS: 25.0000 mg | ORAL_TABLET | Freq: Three times a day (TID) | ORAL | 0 refills | Status: DC | PRN

## 2016-06-25 NOTE — Discharge Summary (Signed)
Physician Discharge Summary  Kenneth [name] IDP:[id number] DOB: [birth year] DOA: 06/22/2016  PCP: Maricela Curet, MD  Admit date: 06/22/2016 Discharge date: 06/25/2016   Recommendations for Outpatient Follow-up: Patient is advised to take Lantus insulin 30 units at bedtime daily as well as to take NovoLog insulin 16 units before meals 3 times a day daily in addition is prescribed lisinopril 10 mg for renal protective effect as well as all other medicines he is to follow-up in the office within one week's time to assess glycemic control electrolytes hemodynamic stability Discharge Diagnoses:  Active Problems:   Acute renal failure (HCC)   Hyperkalemia   Hyponatremia   MDD (major depressive disorder), recurrent severe, without psychosis ([place])   Diabetes mellitus type 2, uncontrolled, with complications (HCC)   Protein-calorie malnutrition, severe   DKA, type 2 [facility])   Discharge Condition: Good  Filed Weights   06/23/16 0500 06/24/16 0500 06/25/16 0457  Weight: 53.4 kg (117 lb 11.6 oz) 57.9 kg (127 lb 10.3 oz) 57.9 kg (127 lb 10.3 oz)    History of present illness:  Patient is a 51 year old white male who had stopped all of his insulin for several weeks was found to be in DKA with glucoses in excess of 700 enlarged anion gap and placed immediately in ICU given fluid resuscitation and IV Glucomander protocol his glucose somewhat normalized within 12 hour period his anion gap narrowed and then he was treated and 2 and up with a split makes regimen of long-acting Lantus at bedtime and NovoLog insulin before meals 3 times a day he had some fair to good glycemic control prior to discharge and was subtotally discharged on his new medical regimen including ACE inhibitor he is to follow-up in the office within one week's time to assess glycemic control hemodynamics control and electrolytes  Hospital Course:    Procedures:    Consultations:    Discharge Instructions  Discharge  Instructions    Discharge instructions    Complete by:  As directed    Discharge patient    Complete by:  As directed    Discharge disposition:  01-Home or Self Care   Discharge patient date:  06/25/2016     Allergies as of 06/25/2016      Reactions   Metformin And Related Other (See Comments)   Abdominal cramps and diarrhea   Nsaids Nausea Only      Medication List    STOP taking these medications   insulin aspart 100 UNIT/ML FlexPen Commonly known as:  NOVOLOG Replaced by:  insulin aspart 100 UNIT/ML injection     TAKE these medications   FLUoxetine 20 MG tablet Commonly known as:  PROZAC Take 1 tablet by mouth daily.   gabapentin 300 MG capsule Commonly known as:  NEURONTIN Take 1 capsule by mouth 3 (three) times daily.   glucose blood test strip Commonly known as:  TRUE METRIX BLOOD GLUCOSE TEST Use as instructed   hydrOXYzine 25 MG tablet Commonly known as:  ATARAX/VISTARIL Take 1 tablet (25 mg total) by mouth 3 (three) times daily as needed for anxiety.   insulin aspart 100 UNIT/ML injection Commonly known as:  novoLOG Inject 16 Units into the skin 3 (three) times daily with meals. Replaces:  insulin aspart 100 UNIT/ML FlexPen   insulin glargine 100 UNIT/ML injection Commonly known as:  LANTUS Inject 0.13 mLs (13 Units total) into the skin at bedtime.   Insulin Pen Needle 29G X 12.7MM Misc Commonly known as:  BD  ULTRA-FINE PEN NEEDLES 1 each by Does not apply route 4 (four) times daily.   lisinopril 10 MG tablet Commonly known as:  ZESTRIL Take 1 tablet (10 mg total) by mouth daily.   pantoprazole 40 MG tablet Commonly known as:  PROTONIX Take 1 tablet (40 mg total) by mouth daily.   TRUE METRIX AIR GLUCOSE METER w/Device Kit 1 each by Does not apply route 4 (four) times daily -  before meals and at bedtime.      Allergies  Allergen Reactions  . Metformin And Related Other (See Comments)    Abdominal cramps and diarrhea  . Nsaids Nausea Only       The results of significant diagnostics from this hospitalization (including imaging, microbiology, ancillary and laboratory) are listed below for reference.    Significant Diagnostic Studies: Dg Chest 2 View  Result Date: 06/15/2016 CLINICAL DATA:  Hyperglycemia EXAM: CHEST  2 VIEW COMPARISON:  06/10/2016 FINDINGS: Cardiomediastinal silhouette is stable. No infiltrate or pleural effusion. No pulmonary edema. Mild degenerative changes mid thoracic spine. IMPRESSION: No active cardiopulmonary disease. Electronically Signed   By: Lahoma Crocker M.D.   On: 06/15/2016 20:39   Dg Chest Port 1 View  Result Date: 06/22/2016 CLINICAL DATA:  Chest pain and shortness of breath EXAM: PORTABLE CHEST 1 VIEW COMPARISON:  June 15, 2016 FINDINGS: There is no edema or consolidation. Heart size and pulmonary vascularity are normal. No adenopathy. No pneumothorax. There is degenerative change in each shoulder. IMPRESSION: No edema or consolidation. Electronically Signed   By: Lowella Grip III M.D.   On: 06/22/2016 20:55   Dg Abdomen Acute W/chest  Result Date: 06/10/2016 CLINICAL DATA:  51 year old male with abdominal pain nausea and vomiting for 4 days. Not tolerating p.o. Initial encounter. EXAM: DG ABDOMEN ACUTE W/ 1V CHEST COMPARISON:  CTA chest abdomen and pelvis 04/04/2016 and earlier. FINDINGS: Seated upright AP view of the chest. Normal lung volumes. Normal cardiac size and mediastinal contours. Visualized tracheal air column is within normal limits. No pneumothorax or pneumoperitoneum. The lungs are clear. Upright and supine views of the abdomen and pelvis. Non obstructed bowel gas pattern. Similar volume of retained stool in the colon compared to the CT on 02/29/2016. Abdominal and pelvic visceral contours are within normal limits. No acute osseous abnormality identified. IMPRESSION: 1.  Normal bowel gas pattern, no free air. 2. Negative chest. Electronically Signed   By: Genevie Ann M.D.   On: 06/10/2016  10:06    Microbiology: No results found for this or any previous visit (from the past 240 hour(s)).   Labs: Basic Metabolic Panel:  Recent Labs Lab 06/22/16 1323  06/22/16 1919 06/22/16 2342 06/23/16 0529 06/23/16 1743 06/24/16 0430 06/25/16 0447  NA  --   < > 139 140 132*  --  134* 134*  K  --   < > 4.6 4.1 3.9  --  3.5 4.0  CL  --   < > 106 106 102  --  100* 96*  CO2  --   < > 19* 26 23  --  27 31  GLUCOSE  --   < > 229* 125* 238* 500* 189* 357*  BUN  --   < > '17 16 14  '$ --  14 14  CREATININE  --   < > 1.09 0.84 0.73  --  0.66 0.64  CALCIUM  --   < > 8.8* 9.0 8.7*  --  8.2* 8.2*  MG 2.1  --   --   --   --   --   --   --   < > =  values in this interval not displayed. Liver Function Tests:  Recent Labs Lab 06/22/16 1328  AST 23  ALT 27  ALKPHOS 88  BILITOT 2.2*  PROT 8.0  ALBUMIN 4.6    Recent Labs Lab 06/22/16 1328  LIPASE 16   No results for input(s): AMMONIA in the last 168 hours. CBC:  Recent Labs Lab 06/22/16 1328 06/23/16 0529  WBC 13.8* 9.4  HGB 16.1 13.2  HCT 46.4 36.4*  MCV 92.8 88.3  PLT 323 250   Cardiac Enzymes:  Recent Labs Lab 06/22/16 1323 06/22/16 2342 06/23/16 0529  TROPONINI <0.03 <0.03 <0.03   BNP: BNP (last 3 results) No results for input(s): BNP in the last 8760 hours.  ProBNP (last 3 results) No results for input(s): PROBNP in the last 8760 hours.  CBG:  Recent Labs Lab 06/24/16 2007 06/24/16 2336 06/25/16 0454 06/25/16 0725 06/25/16 1102  GLUCAP 239* 179* 333* 356* 207*       Signed:  [facility] Pager: [phone] 06/25/2016, 11:40 AM

## 2016-06-25 NOTE — Progress Notes (Signed)
In to see patient at this time to administer morning insulin at 0400. The patient was informed of his blood sugar being 333. The patient at this time refuses to take his insulin.  The patient was advised and educated about the risks and benefits of not adhering to his suggested treatment regimen from his medical team.

## 2016-06-29 ENCOUNTER — Ambulatory Visit (HOSPITAL_COMMUNITY)
Admission: RE | Admit: 2016-06-29 | Discharge: 2016-06-29 | Disposition: A | Discharge status: Home / Self Care | Payer: Medicaid Other | Source: Ambulatory Visit | Attending: Family Medicine | Admitting: Family Medicine

## 2016-06-29 ENCOUNTER — Other Ambulatory Visit (HOSPITAL_COMMUNITY): Payer: Self-pay | Admitting: Family Medicine

## 2016-06-29 DIAGNOSIS — R52 Pain, unspecified: Secondary | ICD-10-CM

## 2016-06-29 DIAGNOSIS — M542 Cervicalgia: Secondary | ICD-10-CM | POA: Insufficient documentation

## 2016-06-29 DIAGNOSIS — M549 Dorsalgia, unspecified: Secondary | ICD-10-CM | POA: Diagnosis not present

## 2016-11-16 ENCOUNTER — Encounter: Payer: Self-pay | Admitting: "Endocrinology

## 2016-11-25 ENCOUNTER — Other Ambulatory Visit (HOSPITAL_COMMUNITY): Payer: Self-pay | Admitting: Family Medicine

## 2016-11-25 DIAGNOSIS — M545 Low back pain: Secondary | ICD-10-CM

## 2016-11-29 ENCOUNTER — Encounter (HOSPITAL_COMMUNITY): Payer: Self-pay | Admitting: *Deleted

## 2016-11-29 ENCOUNTER — Inpatient Hospital Stay (HOSPITAL_COMMUNITY)
Admission: EM | Admit: 2016-11-29 | Discharge: 2016-11-30 | DRG: 638 | Disposition: A | Discharge status: Home / Self Care | Payer: Medicaid Other | Attending: Family Medicine | Admitting: Family Medicine

## 2016-11-29 ENCOUNTER — Emergency Department (HOSPITAL_COMMUNITY): Payer: Medicaid Other

## 2016-11-29 DIAGNOSIS — E1140 Type 2 diabetes mellitus with diabetic neuropathy, unspecified: Secondary | ICD-10-CM | POA: Diagnosis present

## 2016-11-29 DIAGNOSIS — Z9114 Patient's other noncompliance with medication regimen: Secondary | ICD-10-CM | POA: Diagnosis not present

## 2016-11-29 DIAGNOSIS — Z8639 Personal history of other endocrine, nutritional and metabolic disease: Secondary | ICD-10-CM | POA: Diagnosis not present

## 2016-11-29 DIAGNOSIS — Z794 Long term (current) use of insulin: Secondary | ICD-10-CM

## 2016-11-29 DIAGNOSIS — Z886 Allergy status to analgesic agent status: Secondary | ICD-10-CM

## 2016-11-29 DIAGNOSIS — E875 Hyperkalemia: Secondary | ICD-10-CM | POA: Diagnosis present

## 2016-11-29 DIAGNOSIS — E876 Hypokalemia: Secondary | ICD-10-CM | POA: Diagnosis present

## 2016-11-29 DIAGNOSIS — R112 Nausea with vomiting, unspecified: Secondary | ICD-10-CM

## 2016-11-29 DIAGNOSIS — E871 Hypo-osmolality and hyponatremia: Secondary | ICD-10-CM | POA: Diagnosis present

## 2016-11-29 DIAGNOSIS — N179 Acute kidney failure, unspecified: Secondary | ICD-10-CM | POA: Diagnosis present

## 2016-11-29 DIAGNOSIS — R64 Cachexia: Secondary | ICD-10-CM | POA: Diagnosis present

## 2016-11-29 DIAGNOSIS — F319 Bipolar disorder, unspecified: Secondary | ICD-10-CM | POA: Diagnosis present

## 2016-11-29 DIAGNOSIS — N171 Acute kidney failure with acute cortical necrosis: Secondary | ICD-10-CM

## 2016-11-29 DIAGNOSIS — F17200 Nicotine dependence, unspecified, uncomplicated: Secondary | ICD-10-CM | POA: Diagnosis present

## 2016-11-29 DIAGNOSIS — Z79899 Other long term (current) drug therapy: Secondary | ICD-10-CM | POA: Diagnosis not present

## 2016-11-29 DIAGNOSIS — E1010 Type 1 diabetes mellitus with ketoacidosis without coma: Secondary | ICD-10-CM | POA: Diagnosis not present

## 2016-11-29 DIAGNOSIS — E869 Volume depletion, unspecified: Secondary | ICD-10-CM

## 2016-11-29 DIAGNOSIS — Z681 Body mass index (BMI) 19 or less, adult: Secondary | ICD-10-CM | POA: Diagnosis not present

## 2016-11-29 DIAGNOSIS — E860 Dehydration: Secondary | ICD-10-CM | POA: Diagnosis present

## 2016-11-29 DIAGNOSIS — F913 Oppositional defiant disorder: Secondary | ICD-10-CM | POA: Diagnosis present

## 2016-11-29 DIAGNOSIS — E1110 Type 2 diabetes mellitus with ketoacidosis without coma: Secondary | ICD-10-CM | POA: Diagnosis present

## 2016-11-29 DIAGNOSIS — E0810 Diabetes mellitus due to underlying condition with ketoacidosis without coma: Secondary | ICD-10-CM

## 2016-11-29 DIAGNOSIS — G629 Polyneuropathy, unspecified: Secondary | ICD-10-CM

## 2016-11-29 DIAGNOSIS — Z888 Allergy status to other drugs, medicaments and biological substances status: Secondary | ICD-10-CM

## 2016-11-29 LAB — BASIC METABOLIC PANEL
ANION GAP: 6 (ref 5–15)
BUN: 29 mg/dL — ABNORMAL HIGH (ref 6–20)
BUN: 22 mg/dL — ABNORMAL HIGH (ref 6–20)
CHLORIDE: 98 mmol/L — AB (ref 101–111)
CHLORIDE: 103 mmol/L (ref 101–111)
CO2: 13 mmol/L — AB (ref 22–32)
CO2: 24 mmol/L (ref 22–32)
CREATININE: 1.29 mg/dL — AB (ref 0.61–1.24)
Calcium: 9.3 mg/dL (ref 8.9–10.3)
Calcium: 8.7 mg/dL — ABNORMAL LOW (ref 8.9–10.3)
Creatinine, Ser: 0.83 mg/dL (ref 0.61–1.24)
GFR calc non Af Amer: >60 mL/min (ref >60)
GFR calc non Af Amer: >60 mL/min (ref >60)
Glucose, Bld: 396 mg/dL — ABNORMAL HIGH (ref 65–99)
Glucose, Bld: 75 mg/dL (ref 65–99)
Potassium: 4.4 mmol/L (ref 3.5–5.1)
Potassium: 3.6 mmol/L (ref 3.5–5.1)
SODIUM: 134 mmol/L — AB (ref 135–145)
Sodium: 133 mmol/L — ABNORMAL LOW (ref 135–145)

## 2016-11-29 LAB — LACTIC ACID, PLASMA
Lactic Acid, Venous: 2.2 mmol/L (ref 0.5–1.9)
Lactic Acid, Venous: 2.1 mmol/L (ref 0.5–1.9)

## 2016-11-29 LAB — CBC
HEMATOCRIT: 47.6 % (ref 39.0–52.0)
HEMOGLOBIN: 16.8 g/dL (ref 13.0–17.0)
MCH: 32.2 pg (ref 26.0–34.0)
MCHC: 35.3 g/dL (ref 30.0–36.0)
MCV: 91.4 fL (ref 78.0–100.0)
Platelets: 204 10*3/uL (ref 150–400)
RBC: 5.21 MIL/uL (ref 4.22–5.81)
RDW: 12.7 % (ref 11.5–15.5)
WBC: 9.3 10*3/uL (ref 4.0–10.5)

## 2016-11-29 LAB — GLUCOSE, CAPILLARY
GLUCOSE-CAPILLARY: 347 mg/dL — AB (ref 65–99)
GLUCOSE-CAPILLARY: 238 mg/dL — AB (ref 65–99)
GLUCOSE-CAPILLARY: 340 mg/dL — AB (ref 65–99)
GLUCOSE-CAPILLARY: 172 mg/dL — AB (ref 65–99)
GLUCOSE-CAPILLARY: 143 mg/dL — AB (ref 65–99)
GLUCOSE-CAPILLARY: 159 mg/dL — AB (ref 65–99)
GLUCOSE-CAPILLARY: 167 mg/dL — AB (ref 65–99)
Glucose-Capillary: 466 mg/dL — ABNORMAL HIGH (ref 65–99)
Glucose-Capillary: 289 mg/dL — ABNORMAL HIGH (ref 65–99)
Glucose-Capillary: 48 mg/dL — ABNORMAL LOW (ref 65–99)
Glucose-Capillary: 523 mg/dL (ref 65–99)

## 2016-11-29 LAB — COMPREHENSIVE METABOLIC PANEL
ALBUMIN: 4.8 g/dL (ref 3.5–5.0)
ALT: 21 U/L (ref 17–63)
AST: 19 U/L (ref 15–41)
Alkaline Phosphatase: 70 U/L (ref 38–126)
BUN: 30 mg/dL — AB (ref 6–20)
CHLORIDE: 87 mmol/L — AB (ref 101–111)
CO2: 13 mmol/L — ABNORMAL LOW (ref 22–32)
Calcium: 10.1 mg/dL (ref 8.9–10.3)
Creatinine, Ser: 1.51 mg/dL — ABNORMAL HIGH (ref 0.61–1.24)
GFR calc Af Amer: 59 mL/min — ABNORMAL LOW (ref >60)
GFR calc non Af Amer: 51 mL/min — ABNORMAL LOW (ref >60)
GLUCOSE: 592 mg/dL — AB (ref 65–99)
POTASSIUM: 5.3 mmol/L — AB (ref 3.5–5.1)
SODIUM: 128 mmol/L — AB (ref 135–145)
Total Bilirubin: 2 mg/dL — ABNORMAL HIGH (ref 0.3–1.2)
Total Protein: 8.3 g/dL — ABNORMAL HIGH (ref 6.5–8.1)

## 2016-11-29 LAB — URINALYSIS, MICROSCOPIC (REFLEX)
BACTERIA UA: NONE SEEN
SQUAMOUS EPITHELIAL / LPF: NONE SEEN
WBC, UA: NONE SEEN WBC/hpf (ref 0–5)

## 2016-11-29 LAB — URINALYSIS, ROUTINE W REFLEX MICROSCOPIC
BILIRUBIN URINE: NEGATIVE
Hgb urine dipstick: NEGATIVE
LEUKOCYTES UA: NEGATIVE
Nitrite: NEGATIVE
PH: 5.5 (ref 5.0–8.0)
Protein, ur: NEGATIVE mg/dL
SPECIFIC GRAVITY, URINE: 1.02 (ref 1.005–1.030)

## 2016-11-29 LAB — BLOOD GAS, VENOUS
Acid-base deficit: 17.5 mmol/L — ABNORMAL HIGH (ref 0.0–2.0)
Bicarbonate: 11.1 mmol/L — ABNORMAL LOW (ref 20.0–28.0)
Drawn by: 1528
FIO2: 21
O2 SAT: 93 %
PCO2 VEN: 31.5 mmHg — AB (ref 44.0–60.0)
PH VEN: 7.126 — AB (ref 7.250–7.430)
PO2 VEN: 83.6 mmHg — AB (ref 32.0–45.0)

## 2016-11-29 LAB — CBG MONITORING, ED
GLUCOSE-CAPILLARY: 518 mg/dL — AB (ref 65–99)
Glucose-Capillary: 551 mg/dL (ref 65–99)

## 2016-11-29 LAB — LIPASE, BLOOD: LIPASE: 20 U/L (ref 11–51)

## 2016-11-29 LAB — MRSA PCR SCREENING: MRSA by PCR: NEGATIVE

## 2016-11-29 MED ORDER — DEXTROSE 50 % IV SOLN
INTRAVENOUS | Status: AC
  Administered 2016-11-29: 50 mL
  Filled 2016-11-29: qty 50

## 2016-11-29 MED ORDER — SODIUM CHLORIDE 0.9 % IV BOLUS (SEPSIS): 1000.0000 mL | Freq: Once | INTRAVENOUS | Status: DC

## 2016-11-29 MED ORDER — QUETIAPINE FUMARATE 100 MG PO TABS
100.0000 mg | ORAL_TABLET | Freq: Every day | ORAL | Status: DC
  Administered 2016-11-29: 100 mg via ORAL
  Filled 2016-11-29 (×2): qty 1

## 2016-11-29 MED ORDER — SODIUM CHLORIDE 0.9 % IV SOLN: INTRAVENOUS | Status: DC

## 2016-11-29 MED ORDER — SODIUM CHLORIDE 0.9 % IV SOLN
Freq: Once | INTRAVENOUS | Status: AC
  Administered 2016-11-29: 06:00:00 via INTRAVENOUS

## 2016-11-29 MED ORDER — GABAPENTIN 300 MG PO CAPS
300.0000 mg | ORAL_CAPSULE | Freq: Three times a day (TID) | ORAL | Status: DC
  Administered 2016-11-29 – 2016-11-30 (×4): 300 mg via ORAL
  Filled 2016-11-29 (×4): qty 1

## 2016-11-29 MED ORDER — SODIUM CHLORIDE 0.9 % IV SOLN
INTRAVENOUS | Status: DC
  Administered 2016-11-29: 17:00:00 via INTRAVENOUS

## 2016-11-29 MED ORDER — SODIUM CHLORIDE 0.9 % IV SOLN
INTRAVENOUS | Status: AC
  Administered 2016-11-29: 07:00:00 via INTRAVENOUS

## 2016-11-29 MED ORDER — ONDANSETRON HCL 4 MG/2ML IJ SOLN
4.0000 mg | Freq: Once | INTRAMUSCULAR | Status: AC
  Administered 2016-11-29: 4 mg via INTRAVENOUS
  Filled 2016-11-29: qty 2

## 2016-11-29 MED ORDER — DEXTROSE-NACL 5-0.45 % IV SOLN
INTRAVENOUS | Status: DC
  Administered 2016-11-29: 10:00:00 via INTRAVENOUS

## 2016-11-29 MED ORDER — DEXTROSE-NACL 5-0.45 % IV SOLN: INTRAVENOUS | Status: DC

## 2016-11-29 MED ORDER — ORAL CARE MOUTH RINSE
15.0000 mL | Freq: Two times a day (BID) | OROMUCOSAL | Status: DC
  Administered 2016-11-29: 15 mL via OROMUCOSAL

## 2016-11-29 MED ORDER — CHLORHEXIDINE GLUCONATE 0.12 % MT SOLN
15.0000 mL | Freq: Two times a day (BID) | OROMUCOSAL | Status: DC
  Administered 2016-11-29: 15 mL via OROMUCOSAL
  Filled 2016-11-29: qty 15

## 2016-11-29 MED ORDER — HYDROXYZINE HCL 25 MG PO TABS
25.0000 mg | ORAL_TABLET | Freq: Three times a day (TID) | ORAL | Status: DC | PRN
Start: 2016-11-29 — End: 2016-11-30

## 2016-11-29 MED ORDER — INSULIN GLARGINE 100 UNIT/ML ~~LOC~~ SOLN
45.0000 [IU] | Freq: Every day | SUBCUTANEOUS | Status: DC
  Administered 2016-11-29: 45 [IU] via SUBCUTANEOUS
  Filled 2016-11-29 (×2): qty 0.45

## 2016-11-29 MED ORDER — ENOXAPARIN SODIUM 30 MG/0.3ML ~~LOC~~ SOLN
30.0000 mg | Freq: Every day | SUBCUTANEOUS | Status: DC
  Administered 2016-11-29: 30 mg via SUBCUTANEOUS
  Filled 2016-11-29 (×2): qty 0.3

## 2016-11-29 MED ORDER — SODIUM CHLORIDE 0.9 % IV BOLUS (SEPSIS)
1000.0000 mL | Freq: Once | INTRAVENOUS | Status: AC
  Administered 2016-11-29: 1000 mL via INTRAVENOUS

## 2016-11-29 MED ORDER — INSULIN ASPART 100 UNIT/ML ~~LOC~~ SOLN
16.0000 [IU] | Freq: Three times a day (TID) | SUBCUTANEOUS | Status: DC
  Administered 2016-11-30: 16 [IU] via SUBCUTANEOUS

## 2016-11-29 MED ORDER — POTASSIUM CHLORIDE 10 MEQ/100ML IV SOLN: 10.0000 meq | INTRAVENOUS | Status: DC

## 2016-11-29 MED ORDER — SODIUM CHLORIDE 0.9 % IV SOLN
INTRAVENOUS | Status: DC
  Administered 2016-11-29: 5.3 [IU]/h via INTRAVENOUS
  Filled 2016-11-29 (×2): qty 1

## 2016-11-29 MED ORDER — INSULIN ASPART 100 UNIT/ML ~~LOC~~ SOLN: 0.0000 [IU] | Freq: Three times a day (TID) | SUBCUTANEOUS | Status: DC

## 2016-11-29 MED ORDER — FLUOXETINE HCL 20 MG PO CAPS
20.0000 mg | ORAL_CAPSULE | Freq: Every day | ORAL | Status: DC
  Administered 2016-11-29 – 2016-11-30 (×2): 20 mg via ORAL
  Filled 2016-11-29 (×2): qty 1

## 2016-11-29 MED ORDER — PANTOPRAZOLE SODIUM 40 MG PO TBEC
40.0000 mg | DELAYED_RELEASE_TABLET | Freq: Every day | ORAL | Status: DC
  Administered 2016-11-29 – 2016-11-30 (×2): 40 mg via ORAL
  Filled 2016-11-29 (×2): qty 1

## 2016-11-29 NOTE — ED Notes (Signed)
CRITICAL VALUE ALERT  Critical Value:  Lactic Acid 2.2  Date & Time Notied:  11/29/2016  [id number]  Provider Notified: Dr. Selena BattenKim via Wynona Dovehris Winningham, RN  Orders Received/Actions taken: continue with orders already provided.

## 2016-11-29 NOTE — ED Triage Notes (Signed)
Pt c/o n/v for the last 3 days; pt states he needs fluids

## 2016-11-29 NOTE — Progress Notes (Signed)
Patient with insulin-dependent diabetes, likewise with oppositional defiant disorder, chronic noncompliance, bipolar disorder his insulin regimen is supposed to be Lantus 45 units at bedtime as well as NovoLog 12 units before meals 3 times a day he is currently on Glucomander protocol with DKA and we'll continue Cipro until acidosis is resolved anion gap closes we'll check be met daily Kenneth [name] CZG:[id number] DOB: Jul 30, 1965 DOA: 11/29/2016 PCP: [name], MD   Physical Exam: Blood pressure 119/78, pulse 75, temperature 98.1 F (36.7 C), temperature source Oral, resp. rate 17, height '5\' 9"'$  (1.753 m), weight 59 kg (130 lb), SpO2 100 %. Lungs clear to A&P no rales wheeze rhonchi heart regular rhythm no S3 or S4 no heaves thrills rubs abdomen soft nontender bowel sounds normoactive no guarding or rebound   Investigations:  No results found for this or any previous visit (from the past 240 hour(s)).   Basic Metabolic Panel:  Recent Labs  11/29/16 0526 11/29/16 0821  NA 128* 134*  K 5.3* 4.4  CL 87* 98*  CO2 13* 13*  GLUCOSE 592* 396*  BUN 30* 29*  CREATININE 1.51* 1.29*  CALCIUM 10.1 9.3   Liver Function Tests:  Recent Labs  11/29/16 0526  AST 19  ALT 21  ALKPHOS 70  BILITOT 2.0*  PROT 8.3*  ALBUMIN 4.8     CBC:  Recent Labs  11/29/16 0526  WBC 9.3  HGB 16.8  HCT 47.6  MCV 91.4  PLT 204    Dg Chest Portable 1 View  Result Date: 11/29/2016 CLINICAL DATA:  Nausea and vomiting for 3 days. Chest pain and shortness of breath. Smoker. History of diabetes. EXAM: PORTABLE CHEST 1 VIEW COMPARISON:  06/22/2016 FINDINGS: The heart size and mediastinal contours are within normal limits. Both lungs are clear. The visualized skeletal structures are unremarkable. IMPRESSION: No active disease. Electronically Signed   By: Lucienne Capers M.D.   On: 11/29/2016 06:28      Medications:  Impression:  Principal Problem:   DKA (diabetic ketoacidoses) ([place]) Active  Problems:   Acute renal failure (HCC)     Plan:1 anion gap closes switch over to long-acting insulins with before meals and at bedtime NovoLog supplementation  Consultants:    Procedures   Antibiotics:           Time spent: 30 minutes   LOS: 0 days   Kenneth [name] M   11/29/2016, 1:32 PM

## 2016-11-29 NOTE — H&P (Signed)
TRH H&P   Patient Demographics:    Crist Kruszka, is a 51 y.o. male  MRN: [id number]   DOB - 1965/12/07  Admit Date - 11/29/2016  Outpatient Primary MD for the patient is [name], MD  Referring MD/NP/PA:  Rolland Porter  Outpatient Specialists:   Patient coming from: home  Chief Complaint  Patient presents with  . Emesis      HPI:    Deren Degrazia  is a 51 y.o. male, w Dm2, on insulin, diabetic neuropathy, apparently c/o n/v for the past 3 days.   Slight abdominal cramping, polyuria.  Denies fever, chills, diarrhea, brbpr.  Pt denies noncompliance,  Felt like he was in DKA and therefore presented to ED.   In ED, pt glucose =592, Hco3 13, Na 128,  K 5.3,  Pt will be admitted for DKA.     Review of systems:    In addition to the HPI above,  No Fever-chills, No Headache, No changes with Vision or hearing, No problems swallowing food or Liquids, No Chest pain, Cough or Shortness of Breath,  No Blood in stool or Urine, No dysuria, No new skin rashes or bruises, No new joints pains-aches,  No new weakness, tingling, numbness in any extremity, No recent weight gain or loss, No polyuria, polydypsia or polyphagia, No significant Mental Stressors.  A full 10 point Review of Systems was done, except as stated above, all other Review of Systems were negative.   With Past History of the following :    Past Medical History:  Diagnosis Date  . Arthritis   . Chronic generalized pain   . Diabetes mellitus   . DKA (diabetic ketoacidoses) ([place])   . DKA (diabetic ketoacidosis) ([place]) 07/2014  . Malnutrition ([place])   . Neuropathy   . Neuropathy   . Noncompliance with medication regimen   . Thyroid disease       History reviewed. No pertinent surgical history.    Social History:     Social History  Substance Use Topics  . Smoking status: Current Every Day Smoker      Packs/day: 0.50    Years: 8.00  . Smokeless tobacco: Never Used     Comment: states he hasn't smoked since 06/05/16  . Alcohol use No     Lives - at home  Mobility - walks by self   Family History :     Family History  Problem Relation Age of Onset  . Diabetes Mother   . Seizures Sister   . Diabetes Sister       Home Medications:   Prior to Admission medications   Medication Sig Start Date End Date Taking? Authorizing Provider  Blood Glucose Monitoring Suppl (TRUE METRIX AIR GLUCOSE METER) w/Device KIT 1 each by Does not apply route 4 (four) times daily -  before meals and at bedtime. 03/25/16   Cammie Sickle  M, FNP  FLUoxetine (PROZAC) 20 MG tablet Take 1 tablet by mouth daily. 04/06/16   [provider]  gabapentin (NEURONTIN) 300 MG capsule Take 1 capsule by mouth 3 (three) times daily. 03/25/16   [provider]  glucose blood (TRUE METRIX BLOOD GLUCOSE TEST) test strip Use as instructed 03/25/16   Dorena Dew, FNP  hydrOXYzine (ATARAX/VISTARIL) 25 MG tablet Take 1 tablet (25 mg total) by mouth 3 (three) times daily as needed for anxiety. 06/25/16   [name], MD  insulin aspart (NOVOLOG) 100 UNIT/ML injection Inject 16 Units into the skin 3 (three) times daily with meals. 06/25/16   [name], MD  insulin glargine (LANTUS) 100 UNIT/ML injection Inject 0.13 mLs (13 Units total) into the skin at bedtime. 06/12/16   Isaac Bliss, Rayford Halsted, MD  Insulin Pen Needle (BD ULTRA-FINE PEN NEEDLES) 29G X 12.7MM MISC 1 each by Does not apply route 4 (four) times daily. 04/26/16   Dorena Dew, FNP  lisinopril (ZESTRIL) 10 MG tablet Take 1 tablet (10 mg total) by mouth daily. 06/25/16   [name], MD  pantoprazole (PROTONIX) 40 MG tablet Take 1 tablet (40 mg total) by mouth daily. 06/12/16   Isaac Bliss, Rayford Halsted, MD     Allergies:     Allergies  Allergen Reactions  . Metformin And Related Other (See Comments)    Abdominal cramps  and diarrhea  . Nsaids Nausea Only     Physical Exam:   Vitals  Blood pressure 116/83, pulse 99, temperature 97.8 F (36.6 C), temperature source Oral, resp. rate 17, height '5\' 9"'$  (1.753 m), weight 59 kg (130 lb), SpO2 100 %.   1. General  lying in bed in NAD,  Very cachectic  2. Normal affect and insight, Not Suicidal or Homicidal, Awake Alert, Oriented X 3.  3. No F.N deficits, ALL C.Nerves Intact, Strength 5/5 all 4 extremities, Sensation intact all 4 extremities, Plantars down going.  4. Ears and Eyes appear Normal, Conjunctivae clear, PERRLA. Moist Oral Mucosa.  5. Supple Neck, No JVD, No cervical lymphadenopathy appriciated, No Carotid Bruits.  6. Symmetrical Chest wall movement, Good air movement bilaterally, CTAB.  7. RRR, No Gallops, Rubs or Murmurs, No Parasternal Heave.  8. Positive Bowel Sounds, Abdomen Soft, No tenderness, No organomegaly appriciated,No rebound -guarding or rigidity.  9.  No Cyanosis, Normal Skin Turgor, No Skin Rash or Bruise.  10. Good muscle tone,  joints appear normal , no effusions, Normal ROM.  11. No Palpable Lymph Nodes in Neck or Axillae     Data Review:    CBC  Recent Labs Lab 11/29/16 0526  WBC 9.3  HGB 16.8  HCT 47.6  PLT 204  MCV 91.4  MCH 32.2  MCHC 35.3  RDW 12.7   ------------------------------------------------------------------------------------------------------------------  Chemistries   Recent Labs Lab 11/29/16 0526  NA 128*  K 5.3*  CL 87*  CO2 13*  GLUCOSE 592*  BUN 30*  CREATININE 1.51*  CALCIUM 10.1  AST 19  ALT 21  ALKPHOS 70  BILITOT 2.0*   ------------------------------------------------------------------------------------------------------------------ estimated creatinine clearance is 47.2 mL/min (A) (by C-G formula based on SCr of 1.51 mg/dL (H)). ------------------------------------------------------------------------------------------------------------------ No results for  input(s): TSH, T4TOTAL, T3FREE, THYROIDAB in the last 72 hours.  Invalid input(s): FREET3  Coagulation profile No results for input(s): INR, PROTIME in the last 168 hours. ------------------------------------------------------------------------------------------------------------------- No results for input(s): DDIMER in the last 72 hours. -------------------------------------------------------------------------------------------------------------------  Cardiac Enzymes No results for input(s): CKMB, TROPONINI, MYOGLOBIN  in the last 168 hours.  Invalid input(s): CK ------------------------------------------------------------------------------------------------------------------ No results found for: BNP   ---------------------------------------------------------------------------------------------------------------  Urinalysis    Component Value Date/Time   COLORURINE STRAW (A) 06/22/2016 1259   APPEARANCEUR CLEAR 06/22/2016 1259   LABSPEC 1.020 06/22/2016 1259   PHURINE 5.0 06/22/2016 1259   GLUCOSEU >=500 (A) 06/22/2016 1259   HGBUR NEGATIVE 06/22/2016 1259   BILIRUBINUR NEGATIVE 06/22/2016 1259   KETONESUR 80 (A) 06/22/2016 1259   PROTEINUR NEGATIVE 06/22/2016 1259   UROBILINOGEN 0.2 01/18/2015 2300   NITRITE NEGATIVE 06/22/2016 1259   LEUKOCYTESUR NEGATIVE 06/22/2016 1259    ----------------------------------------------------------------------------------------------------------------   Imaging Results:    Dg Chest Portable 1 View  Result Date: 11/29/2016 CLINICAL DATA:  Nausea and vomiting for 3 days. Chest pain and shortness of breath. Smoker. History of diabetes. EXAM: PORTABLE CHEST 1 VIEW COMPARISON:  06/22/2016 FINDINGS: The heart size and mediastinal contours are within normal limits. Both lungs are clear. The visualized skeletal structures are unremarkable. IMPRESSION: No active disease. Electronically Signed   By: Lucienne Capers M.D.   On: 11/29/2016 06:28      Assessment & Plan:    Principal Problem:   DKA (diabetic ketoacidoses) ([place]) Active Problems:   Acute renal failure (HCC)    DKA Ns iv Insulin iv Bmp q4hx5  ARF Hydrate with ns iv  Hyperkalemia Check bmp as above  Hyponatremia secondary to pseudohyponatremia Hydrate with ns iv Check bmp as above    DVT Prophylaxis Lovenox - SCDs  AM Labs Ordered, also please review Full Orders  Family Communication: Admission, patients condition and plan of care including tests being ordered have been discussed with the patient  who indicate understanding and agree with the plan and Code Status.  Code Status FULL CODE  Likely DC to  home  Condition GUARDED    Consults called: none   Admission status: inpatient   Time spent in minutes : 45 minutes   Jani Gravel M.D on 11/29/2016 at 6:40 AM  Between 7am to 7pm - Pager - [phone]  . After 7pm go to [email or web address] - password [facility]  [facility] - Office  [phone]

## 2016-11-29 NOTE — ED Notes (Signed)
Informed pt need of urine sample. Pt states he cannot provide one at this time. Gave pt urinal to get us one when he could.

## 2016-11-29 NOTE — ED Notes (Signed)
Date and time results received: 11/29/16 0610 (use smartphrase ".now" to insert current time)  Test: glucose Critical Value: 592  Name of Provider Notified: Dr. Lynelle DoctorKnapp notified at 0636  Orders Received? Or Actions Taken?: no/na

## 2016-11-29 NOTE — ED Provider Notes (Signed)
[facility] DEPT Provider Note   CSN: [id number] Arrival date & time: 11/29/16  0502  Time seen 05:51 AM  History   Chief Complaint Chief Complaint  Patient presents with  . Emesis    HPI Kenneth [name] is a 51 y.o. male.  HPI   patient states he started feeling bad about 4 days ago. He states he started having nausea and vomiting about 3-4 times a day. He denies diarrhea. He states he has hot flashes and thinks he may have fever however he has not checked his temperature. He states he started feeling short of breath 3-4 days ago and he also has a cough with some yellow sputum production. He states he has pain in his upper chest that comes and goes and is described as aching. He also has diffuse abdominal pain that's described as cramping and pressure. He states he feels like he has lost weight. He states his CBGs "are always high". Started having hiccups in the ED.   PCP Oval Linsey, MD   Past Medical History:  Diagnosis Date  . Arthritis   . Chronic generalized pain   . Diabetes mellitus   . DKA (diabetic ketoacidoses) (HCC)   . DKA (diabetic ketoacidosis) (HCC) 07/2014  . Malnutrition (HCC)   . Neuropathy   . Neuropathy   . Noncompliance with medication regimen   . Thyroid disease     Patient Active Problem List   Diagnosis Date Noted  . DKA, type 2 (HCC) 06/22/2016  . Non compliance w medication regimen   . Protein-calorie malnutrition, severe 06/11/2016  . Chronic generalized pain 04/26/2016  . Neuropathy 04/26/2016  . Diabetes mellitus type 2, uncontrolled, with complications (HCC)   . 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 Depression, recurrent (HCC)   . Abdominal pain   . Hyperglycemia 02/24/2016  . Volume depletion 12/21/2015  . Metabolic acidosis 12/21/2015  . Diabetes mellitus with nonketotic hyperosmolarity (HCC) 09/21/2014  . Noncompliance with medication regimen   . Diabetes mellitus (HCC)   .  DKA (diabetic ketoacidoses) (HCC) 07/24/2014  . Dehydration 07/24/2014  . Acute renal failure (HCC) 07/24/2014  . Hyperkalemia 07/24/2014  . Hyponatremia 07/24/2014  . Hyperthyroidism 07/24/2014    History reviewed. No pertinent surgical history.     Home Medications    Prior to Admission medications   Medication Sig Start Date End Date Taking? Authorizing Provider  Blood Glucose Monitoring Suppl (TRUE METRIX AIR GLUCOSE METER) w/Device KIT 1 each by Does not apply route 4 (four) times daily -  before meals and at bedtime. 03/25/16   Massie Maroon, FNP  FLUoxetine (PROZAC) 20 MG tablet Take 1 tablet by mouth daily. 04/06/16   [provider]  gabapentin (NEURONTIN) 300 MG capsule Take 1 capsule by mouth 3 (three) times daily. 03/25/16   [provider]  glucose blood (TRUE METRIX BLOOD GLUCOSE TEST) test strip Use as instructed 03/25/16   Massie Maroon, FNP  hydrOXYzine (ATARAX/VISTARIL) 25 MG tablet Take 1 tablet (25 mg total) by mouth 3 (three) times daily as needed for anxiety. 06/25/16   Oval Linsey, MD  insulin aspart (NOVOLOG) 100 UNIT/ML injection Inject 16 Units into the skin 3 (three) times daily with meals. 06/25/16   Oval Linsey, MD  insulin glargine (LANTUS) 100 UNIT/ML injection Inject 0.13 mLs (13 Units total) into the skin at bedtime. 06/12/16   Philip Aspen, Limmie Patricia, MD  Insulin Pen Needle (BD ULTRA-FINE PEN NEEDLES) 29G X  12.7MM MISC 1 each by Does not apply route 4 (four) times daily. 04/26/16   Dorena Dew, FNP  lisinopril (ZESTRIL) 10 MG tablet Take 1 tablet (10 mg total) by mouth daily. 06/25/16   [name], MD  pantoprazole (PROTONIX) 40 MG tablet Take 1 tablet (40 mg total) by mouth daily. 06/12/16   Isaac Bliss, Rayford Halsted, MD    Family History Family History  Problem Relation Age of Onset  . Diabetes Mother   . Seizures Sister   . Diabetes Sister     Social History Social History  Substance Use Topics  .  Smoking status: Current Every Day Smoker    Packs/day: 0.50    Years: 8.00  . Smokeless tobacco: Never Used     Comment: states he hasn't smoked since 06/05/16  . Alcohol use No  applying for disability   Allergies   Metformin and related and Nsaids   Review of Systems Review of Systems  All other systems reviewed and are negative.    Physical Exam Updated Vital Signs BP 116/83 (BP Location: Left Arm)   Pulse 99   Temp 97.8 F (36.6 C) (Oral)   Resp 17   Ht '5\' 9"'$  (1.753 m)   Wt 59 kg (130 lb)   SpO2 100%   BMI 19.20 kg/m   Vital signs normal    Physical Exam  Constitutional: He is oriented to person, place, and time.  Non-toxic appearance. He does not appear ill. No distress.  Thin male, frequent hiccuping, smells "fruity"  HENT:  Head: Normocephalic and atraumatic.  Right Ear: External ear normal.  Left Ear: External ear normal.  Nose: Nose normal. No mucosal edema or rhinorrhea.  Mouth/Throat: Oropharynx is clear and moist and mucous membranes are normal. No dental abscesses or uvula swelling.  Eyes: Pupils are equal, round, and reactive to light. Conjunctivae and EOM are normal.  Neck: Normal range of motion and full passive range of motion without pain. Neck supple.  Cardiovascular: Normal rate, regular rhythm and normal heart sounds.  Exam reveals no gallop and no friction rub.   No murmur heard. Pulmonary/Chest: Effort normal and breath sounds normal. No respiratory distress. He has no wheezes. He has no rhonchi. He has no rales. He exhibits no tenderness and no crepitus.  Abdominal: Soft. Normal appearance and bowel sounds are normal. He exhibits no distension. There is no tenderness. There is no rebound and no guarding.  Thin scaphoid abdomen  Musculoskeletal: Normal range of motion. He exhibits no edema or tenderness.  Moves all extremities well.   Neurological: He is alert and oriented to person, place, and time. He has normal strength. No cranial nerve  deficit.  Skin: Skin is warm, dry and intact. No rash noted. No erythema. No pallor.  Psychiatric: He has a normal mood and affect. His speech is normal and behavior is normal. His mood appears not anxious.  Nursing note and vitals reviewed.    ED Treatments / Results  Labs (all labs ordered are listed, but only abnormal results are displayed) Results for orders placed or performed during the hospital encounter of 11/29/16  Lipase, blood  Result Value Ref Range   Lipase 20 11 - 51 U/L  Comprehensive metabolic panel  Result Value Ref Range   Sodium 128 (L) 135 - 145 mmol/L   Potassium 5.3 (H) 3.5 - 5.1 mmol/L   Chloride 87 (L) 101 - 111 mmol/L   CO2 13 (L) 22 - 32 mmol/L  Glucose, Bld 592 (HH) 65 - 99 mg/dL   BUN 30 (H) 6 - 20 mg/dL   Creatinine, Ser 1.51 (H) 0.61 - 1.24 mg/dL   Calcium 10.1 8.9 - 10.3 mg/dL   Total Protein 8.3 (H) 6.5 - 8.1 g/dL   Albumin 4.8 3.5 - 5.0 g/dL   AST 19 15 - 41 U/L   ALT 21 17 - 63 U/L   Alkaline Phosphatase 70 38 - 126 U/L   Total Bilirubin 2.0 (H) 0.3 - 1.2 mg/dL   GFR calc non Af Amer 51 (L) >60 mL/min   GFR calc Af Amer 59 (L) >60 mL/min  CBC  Result Value Ref Range   WBC 9.3 4.0 - 10.5 K/uL   RBC 5.21 4.22 - 5.81 MIL/uL   Hemoglobin 16.8 13.0 - 17.0 g/dL   HCT 47.6 39.0 - 52.0 %   MCV 91.4 78.0 - 100.0 fL   MCH 32.2 26.0 - 34.0 pg   MCHC 35.3 30.0 - 36.0 g/dL   RDW 12.7 11.5 - 15.5 %   Platelets 204 150 - 400 K/uL  Lactic acid, plasma  Result Value Ref Range   Lactic Acid, Venous 2.2 (HH) 0.5 - 1.9 mmol/L  Blood gas, venous  Result Value Ref Range   FIO2 21.00    pH, Ven 7.126 (LL) 7.250 - 7.430   pCO2, Ven 31.5 (L) 44.0 - 60.0 mmHg   pO2, Ven 83.6 (H) 32.0 - 45.0 mmHg   Bicarbonate 11.1 (L) 20.0 - 28.0 mmol/L   Acid-base deficit 17.5 (H) 0.0 - 2.0 mmol/L   O2 Saturation 93.0 %   Collection site VEIN    Drawn by 1528    Sample type VEIN   POC CBG, ED  Result Value Ref Range   Glucose-Capillary 551 (HH) 65 - 99 mg/dL    Comment 1 Notify RN    Comment 2 Document in Chart    Laboratory interpretation all normal except DKA, renal insufficiency  Mildly elevated lactic acid, metabolic acidosis on VBG.    EKG  EKG Interpretation  Date/Time:  Monday November 29 2016 06:17:35 EDT Ventricular Rate:  94 PR Interval:    QRS Duration: 101 QT Interval:  366 QTC Calculation: 458 R Axis:   101 Text Interpretation:  Sinus rhythm Anterior infarct, old Baseline wander in lead(s) V3 V4 V5 V6 Electrode noise No significant change since last tracing 22 Jun 2016 Confirmed by Rolland Porter [id number]) on 11/29/2016 6:34:51 AM       Radiology Dg Chest Portable 1 View  Result Date: 11/29/2016 CLINICAL DATA:  Nausea and vomiting for 3 days. Chest pain and shortness of breath. Smoker. History of diabetes. EXAM: PORTABLE CHEST 1 VIEW COMPARISON:  06/22/2016 FINDINGS: The heart size and mediastinal contours are within normal limits. Both lungs are clear. The visualized skeletal structures are unremarkable. IMPRESSION: No active disease. Electronically Signed   By: Lucienne Capers M.D.   On: 11/29/2016 06:28    Procedures Procedures (including critical care time)  CRITICAL CARE Performed by: Shynia Daleo L Sekai Nayak Total critical care time: 32 minutes Critical care time was exclusive of separately billable procedures and treating other patients. Critical care was necessary to treat or prevent imminent or life-threatening deterioration. Critical care was time spent personally by me on the following activities: development of treatment plan with patient and/or surrogate as well as nursing, discussions with consultants, evaluation of patient's response to treatment, examination of patient, obtaining history from patient or surrogate, ordering and performing treatments and  interventions, ordering and review of laboratory studies, ordering and review of radiographic studies, pulse oximetry and re-evaluation of patient's condition.   Medications  Ordered in ED Medications  sodium chloride 0.9 % bolus 1,000 mL (not administered)  sodium chloride 0.9 % bolus 1,000 mL (not administered)  insulin regular (NOVOLIN R,HUMULIN R) 100 Units in sodium chloride 0.9 % 100 mL (1 Units/mL) infusion (not administered)  ondansetron (ZOFRAN) injection 4 mg (not administered)  dextrose 5 %-0.45 % sodium chloride infusion (not administered)  0.9 %  sodium chloride infusion (not administered)  0.9 %  sodium chloride infusion (not administered)  dextrose 5 %-0.45 % sodium chloride infusion (not administered)  enoxaparin (LOVENOX) injection 30 mg (not administered)  0.9 %  sodium chloride infusion ( Intravenous New Bag/Given 11/29/16 0544)     Initial Impression / Assessment and Plan / ED Course  I have reviewed the triage vital signs and the nursing notes.  Pertinent labs & imaging results that were available during my care of the patient were reviewed by me and considered in my medical decision making (see chart for details).     Patient appeared to be in DKA by physical exam and his history. He appears dehydrated, he has shortness of breath and abdominal pain. This is all consistent with DKA. He also has a fruity odor. He was started on IV fluids and IV insulin drip.  After reviewing his laboratory results the hospitalist was called for admission.  6:32 AM Dr. Maudie Mercury, hospitalist will admit.   Final Clinical Impressions(s) / ED Diagnoses   Final diagnoses:  Diabetic ketoacidosis without coma associated with type 2 diabetes mellitus ([place])  Dehydration  Nausea and vomiting, intractability of vomiting not specified, unspecified vomiting type    Plan admission  Rolland Porter, MD, Barbette Or, MD 11/29/16 [phone]

## 2016-11-30 ENCOUNTER — Inpatient Hospital Stay (HOSPITAL_COMMUNITY): Payer: Medicaid Other

## 2016-11-30 ENCOUNTER — Encounter (HOSPITAL_COMMUNITY): Payer: Self-pay

## 2016-11-30 LAB — BASIC METABOLIC PANEL
ANION GAP: 5 (ref 5–15)
BUN: 15 mg/dL (ref 6–20)
CALCIUM: 8.6 mg/dL — AB (ref 8.9–10.3)
CO2: 26 mmol/L (ref 22–32)
CREATININE: 0.66 mg/dL (ref 0.61–1.24)
Chloride: 103 mmol/L (ref 101–111)
GLUCOSE: 111 mg/dL — AB (ref 65–99)
Potassium: 3 mmol/L — ABNORMAL LOW (ref 3.5–5.1)
Sodium: 134 mmol/L — ABNORMAL LOW (ref 135–145)

## 2016-11-30 LAB — GLUCOSE, CAPILLARY
GLUCOSE-CAPILLARY: 107 mg/dL — AB (ref 65–99)
GLUCOSE-CAPILLARY: 84 mg/dL (ref 65–99)
Glucose-Capillary: 477 mg/dL — ABNORMAL HIGH (ref 65–99)

## 2016-11-30 MED ORDER — QUETIAPINE FUMARATE 100 MG PO TABS: 100.0000 mg | ORAL_TABLET | Freq: Every day | ORAL | 0 refills | Status: DC

## 2016-11-30 MED ORDER — OXYCODONE HCL 5 MG PO TABS
5.0000 mg | ORAL_TABLET | Freq: Four times a day (QID) | ORAL | Status: DC | PRN
  Administered 2016-11-30: 5 mg via ORAL
  Filled 2016-11-30: qty 1

## 2016-11-30 MED ORDER — POTASSIUM CHLORIDE CRYS ER 20 MEQ PO TBCR: 40.0000 meq | EXTENDED_RELEASE_TABLET | Freq: Once | ORAL | Status: DC

## 2016-11-30 NOTE — Progress Notes (Signed)
Inpatient Diabetes Program Recommendations  AACE/ADA: New Consensus Statement on Inpatient Glycemic Control (2015)  Target Ranges:  Prepandial:   less than 140 mg/dL      Peak postprandial:   less than 180 mg/dL (1-2 hours)      Critically ill patients:  140 - 180 mg/dL   Results for Kenneth RainwaterVERNON, [name] (MRN [id number]) as of 11/30/2016 11:24  Ref. Range 11/29/2016 14:34 11/29/2016 15:27 11/29/2016 16:42 11/29/2016 17:14 11/29/2016 21:05 11/30/2016 00:05 11/30/2016 05:40 11/30/2016 07:13  Glucose-Capillary Latest Ref Range: 65 - 99 mg/dL 409159 (H) 811143 (H) 48 (L) 167 (H) 523 (HH) 477 (H) 107 (H) 84   Review of Glycemic Control  Diabetes history: DM1 Outpatient Diabetes medications: Lantus 12 units QHS, Novolog 16 units TID with meals Current orders for Inpatient glycemic control: Lantus 45 units QHS, Novolog 0-9 units TID with meals, Novolog 16 units TID with meals  Inpatient Diabetes Program Recommendations:  Insulin - Basal: Patient is ordered Lantus 45 units QHS and patient received Lantus 45 units on 11/29/16 at 21:25. Concerned that Lantus dose is too high which is likely to lead to hypoglycemia. If patient experiences any hypoglycemia, please consider decreasing Lantus to 15 units QHS (based on 51 kg x 0.3 units).  NOTE: Patient was initially on IV insulin when admitted with DKA and insulin drip was stopped at 15:30 on 11/29/16 and glucose down to 48 mg/dl at 91:4716:42 on 8/29/569/10/18. Then glucose up to 523 at 21:05 likely due to patient not receiving any basal insulin at time of transition off IV insulin and no meal coverage for supper on 11/29/16. Patient received Lantus 45 units last night and fasting glucose 84 mg/dl this morning.   In reviewing chart, noted Lantus dose during previous 2 admission (in March and April 2018) were Lantus 10 units QHS and Lantus 14 units QHS. Concerned about hypoglycemia with Lantus 45 units ordered which has already been given. Recommend decreasing Lantus dose is patient experiences  any hypoglycemia.  Thanks, Orlando PennerMarie Keelin Neville, RN, MSN, CDE Diabetes Coordinator Inpatient Diabetes Program [phone] (Team Pager from 8am to 5pm)

## 2016-11-30 NOTE — Progress Notes (Signed)
Patient is being extremely violent and threatening to nursing staff throughout the morning. He has been yelling out of his room and cursing. Myself, Johnna, and Crystal have all been very accommodating to patient trying to give him the extra tray he requests and making sure he gets out of bed to promote activity and healing. He was upset that we asked him to sit in the chair for awhile so that we could change his bed so that he had clean sheets after he was done sitting up. Patient got out of the chair and in the bed on his own after we requested for him to ask for assistance and then refused to get out of bed so that we could put the clean sheets.  Patient again became verbally abusive and calling the nursing staff names because we were asking him to get out of the bed so that we can put his sheets on the bed. We then had to call security because of patient's refusal. After security came up patient was agreeable and allowed us to change his sheets.

## 2016-11-30 NOTE — Progress Notes (Signed)
This Clinical research associatewriter entered patient's room after noting patient yelling. Upon entering patient's room, this writer noted Brayton CavesJessie, RN in the patient's room trying to comfort patient and calm patient down. Patient stated to Brayton CavesJessie, RN "Excuse me little nurse you're acting like a 51 year old when all I wanted to do is get in my fucking bed! Ya'll told me you would come in here and make my bed up three times!" Patient noted lying on unmade bed that was drying from being wiped down and cleaned. This Clinical research associatewriter spoke with patient and assisted patient back to chair while this Clinical research associatewriter made patient's bed. Security arrived to patient's room and this Clinical research associatewriter noted that the patient denied cursing and yelling at staff. Patient assisted back to bed and has calmed down at this time.

## 2016-11-30 NOTE — Discharge Summary (Signed)
Physician Discharge Summary  SAHITH NURSE WUJ:[id number] DOB: 1965/08/06 DOA: 11/29/2016  PCP: [name], MD  Admit date: 11/29/2016 Discharge date: 11/30/2016   Recommendations for Outpatient Follow-up:  The patient is instructed to take Lantus 15 units subcutaneous at bedtime daily as well as to take NovoLog 16 units before meals 3 times a day before meals. His likewise instructed follow-up my office tomorrow for reevaluation of his outpatient medicines and assessment of hydration status renal functioning as well as glycemic control with the patient has stated he will refuse to go home with warfarin Monday state he is well to go home he will receive 40 mEq of potassium chloride orally prior to discharge his anion gap is closed T is normal and her acidotic he has normal glycemic indices with a past 18 hours patient likewise has oppositional defined disorder bipolar disorder and is chronically noncompliant with medicines according to his sister who comes to the office with him Discharge Diagnoses:  Principal Problem:   DKA (diabetic ketoacidoses) ([place]) Active Problems:   Acute renal failure [facility])   Discharge Condition: Good  Filed Weights   11/29/16 0517 11/30/16 0500  Weight: 59 kg (130 lb) 51.2 kg (112 lb 14 oz)    History of present illness:  The patient is a 51 year old white male with type I insulin minute diabetes is chronic noncompliance point H&P told admission Dr. he was compliant with medicines committed extremely acidotic intravascularly volume depleted with glucoses in the 560s is put on ICU Glucomander protocol repleted with IV fluids home he had some hypokalemia in hospital which was rectified presenting attending Closely became euvolemic clinically and laboratory wise and had normal glycemic indices the past 18 hours he was subsequently discharged on Lantus 50 units at bedtime and NovoLog 16 units before meals 3 times a day to follow-up my office in 1-2 days time.  Patient threatened to 7 times care we will take his insulin and he will return to hospital ASAP his conditioned and intentions were noted. And that we would give him Money to return home. My suspicion is he ran out of his opioids at home and has been receiving M here for the past 24 hours  Advised to follow-up my office tomorrow morning to discuss these matters as an outpatient  Hospital Course:  See history of present illness above  Procedures:  None  Consultations:  None  Discharge Instructions  Discharge Instructions    Discharge instructions    Complete by:  As directed    Discharge patient    Complete by:  As directed    Discharge disposition:  01-Home or Self Care   Discharge patient date:  11/30/2016     Allergies as of 11/30/2016      Reactions   Metformin And Related Other (See Comments)   Abdominal cramps and diarrhea   Nsaids Nausea Only      Medication List    TAKE these medications   FLUoxetine 20 MG tablet Commonly known as:  PROZAC Take 1 tablet by mouth daily.   gabapentin 300 MG capsule Commonly known as:  NEURONTIN Take 1 capsule by mouth 3 (three) times daily.   glucose blood test strip Commonly known as:  TRUE METRIX BLOOD GLUCOSE TEST Use as instructed   hydrOXYzine 25 MG tablet Commonly known as:  ATARAX/VISTARIL Take 1 tablet (25 mg total) by mouth 3 (three) times daily as needed for anxiety.   insulin aspart 100 UNIT/ML injection Commonly known as:  novoLOG Inject 16 Units into the skin 3 (three) times daily with meals. What changed:  how much to take   insulin glargine 100 UNIT/ML injection Commonly known as:  LANTUS Inject 0.13 mLs (13 Units total) into the skin at bedtime. What changed:  how much to take   Insulin Pen Needle 29G X 12.7MM Misc Commonly known as:  BD ULTRA-FINE PEN NEEDLES 1 each by Does not apply route 4 (four) times daily.   lisinopril 10 MG tablet Commonly known as:  ZESTRIL Take 1 tablet (10 mg total) by  mouth daily.   pantoprazole 40 MG tablet Commonly known as:  PROTONIX Take 1 tablet (40 mg total) by mouth daily.   QUEtiapine 100 MG tablet Commonly known as:  SEROQUEL Take 1 tablet (100 mg total) by mouth at bedtime.   TRUE METRIX AIR GLUCOSE METER w/Device Kit 1 each by Does not apply route 4 (four) times daily -  before meals and at bedtime.            Discharge Care Instructions        Start     Ordered   11/30/16 0000  QUEtiapine (SEROQUEL) 100 MG tablet  Daily at bedtime     11/30/16 1336   11/30/16 0000  Discharge instructions     11/30/16 1336   11/30/16 0000  Discharge patient    Question Answer Comment  Discharge disposition 01-Home or Self Care   Discharge patient date 11/30/2016      11/30/16 1336     Allergies  Allergen Reactions  . Metformin And Related Other (See Comments)    Abdominal cramps and diarrhea  . Nsaids Nausea Only      The results of significant diagnostics from this hospitalization (including imaging, microbiology, ancillary and laboratory) are listed below for reference.    Significant Diagnostic Studies: Dg Chest Portable 1 View  Result Date: 11/29/2016 CLINICAL DATA:  Nausea and vomiting for 3 days. Chest pain and shortness of breath. Smoker. History of diabetes. EXAM: PORTABLE CHEST 1 VIEW COMPARISON:  06/22/2016 FINDINGS: The heart size and mediastinal contours are within normal limits. Both lungs are clear. The visualized skeletal structures are unremarkable. IMPRESSION: No active disease. Electronically Signed   By: Lucienne Capers M.D.   On: 11/29/2016 06:28    Microbiology: Recent Results (from the past 240 hour(s))  MRSA PCR Screening     Status: None   Collection Time: 11/29/16  7:16 AM  Result Value Ref Range Status   MRSA by PCR NEGATIVE NEGATIVE Final    Comment:        The GeneXpert MRSA Assay (FDA approved for NASAL specimens only), is one component of a comprehensive MRSA colonization surveillance program.  It is not intended to diagnose MRSA infection nor to guide or monitor treatment for MRSA infections.      Labs: Basic Metabolic Panel:  Recent Labs Lab 11/29/16 0526 11/29/16 0821 11/29/16 1623 11/30/16 0535  NA 128* 134* 133* 134*  K 5.3* 4.4 3.6 3.0*  CL 87* 98* 103 103  CO2 13* 13* 24 26  GLUCOSE 592* 396* 75 111*  BUN 30* 29* 22* 15  CREATININE 1.51* 1.29* 0.83 0.66  CALCIUM 10.1 9.3 8.7* 8.6*   Liver Function Tests:  Recent Labs Lab 11/29/16 0526  AST 19  ALT 21  ALKPHOS 70  BILITOT 2.0*  PROT 8.3*  ALBUMIN 4.8    Recent Labs Lab 11/29/16 0526  LIPASE 20   No results for input(s):  AMMONIA in the last 168 hours. CBC:  Recent Labs Lab 11/29/16 0526  WBC 9.3  HGB 16.8  HCT 47.6  MCV 91.4  PLT 204   Cardiac Enzymes: No results for input(s): CKTOTAL, CKMB, CKMBINDEX, TROPONINI in the last 168 hours. BNP: BNP (last 3 results) No results for input(s): BNP in the last 8760 hours.  ProBNP (last 3 results) No results for input(s): PROBNP in the last 8760 hours.  CBG:  Recent Labs Lab 11/29/16 1714 11/29/16 2105 11/30/16 0005 11/30/16 0540 11/30/16 0713  GLUCAP 167* 523* 477* 107* 84       Signed:  [facility] Pager: [phone] 11/30/2016, 1:38 PM

## 2016-12-16 ENCOUNTER — Ambulatory Visit (HOSPITAL_COMMUNITY)
Admission: RE | Admit: 2016-12-16 | Discharge: 2016-12-16 | Disposition: A | Discharge status: Home / Self Care | Payer: Medicaid Other | Source: Ambulatory Visit | Attending: Family Medicine | Admitting: Family Medicine

## 2016-12-16 DIAGNOSIS — M5136 Other intervertebral disc degeneration, lumbar region: Secondary | ICD-10-CM | POA: Insufficient documentation

## 2016-12-16 DIAGNOSIS — M5127 Other intervertebral disc displacement, lumbosacral region: Secondary | ICD-10-CM | POA: Diagnosis not present

## 2016-12-16 DIAGNOSIS — M545 Low back pain: Secondary | ICD-10-CM

## 2016-12-16 DIAGNOSIS — M5416 Radiculopathy, lumbar region: Secondary | ICD-10-CM | POA: Diagnosis present

## 2016-12-29 ENCOUNTER — Ambulatory Visit (HOSPITAL_COMMUNITY)
Admission: RE | Admit: 2016-12-29 | Discharge: 2016-12-29 | Disposition: A | Discharge status: Home / Self Care | Payer: Medicaid Other | Source: Ambulatory Visit | Attending: Family Medicine | Admitting: Family Medicine

## 2016-12-29 ENCOUNTER — Other Ambulatory Visit (HOSPITAL_COMMUNITY): Payer: Self-pay | Admitting: Family Medicine

## 2016-12-29 DIAGNOSIS — M25561 Pain in right knee: Secondary | ICD-10-CM

## 2017-04-06 ENCOUNTER — Ambulatory Visit: Payer: Self-pay | Admitting: "Endocrinology

## 2017-06-06 ENCOUNTER — Encounter: Payer: Self-pay | Admitting: "Endocrinology

## 2017-06-16 ENCOUNTER — Other Ambulatory Visit: Payer: Self-pay

## 2017-06-16 ENCOUNTER — Encounter (HOSPITAL_COMMUNITY): Payer: Self-pay | Admitting: Emergency Medicine

## 2017-06-16 ENCOUNTER — Emergency Department (HOSPITAL_COMMUNITY): Payer: Medicaid Other

## 2017-06-16 ENCOUNTER — Observation Stay (HOSPITAL_COMMUNITY)
Admission: EM | Admit: 2017-06-16 | Discharge: 2017-06-24 | Disposition: A | Discharge status: Home / Self Care | Payer: Medicaid Other | Attending: Family Medicine | Admitting: Family Medicine

## 2017-06-16 DIAGNOSIS — Z5900 Homelessness unspecified: Secondary | ICD-10-CM | POA: Insufficient documentation

## 2017-06-16 DIAGNOSIS — E869 Volume depletion, unspecified: Secondary | ICD-10-CM | POA: Diagnosis not present

## 2017-06-16 DIAGNOSIS — Z794 Long term (current) use of insulin: Secondary | ICD-10-CM | POA: Diagnosis not present

## 2017-06-16 DIAGNOSIS — Z9114 Patient's other noncompliance with medication regimen: Secondary | ICD-10-CM | POA: Insufficient documentation

## 2017-06-16 DIAGNOSIS — E1140 Type 2 diabetes mellitus with diabetic neuropathy, unspecified: Secondary | ICD-10-CM | POA: Insufficient documentation

## 2017-06-16 DIAGNOSIS — E872 Acidosis, unspecified: Secondary | ICD-10-CM | POA: Insufficient documentation

## 2017-06-16 DIAGNOSIS — Z79899 Other long term (current) drug therapy: Secondary | ICD-10-CM | POA: Diagnosis not present

## 2017-06-16 DIAGNOSIS — G8929 Other chronic pain: Secondary | ICD-10-CM

## 2017-06-16 DIAGNOSIS — F17200 Nicotine dependence, unspecified, uncomplicated: Secondary | ICD-10-CM | POA: Diagnosis not present

## 2017-06-16 DIAGNOSIS — M25412 Effusion, left shoulder: Secondary | ICD-10-CM

## 2017-06-16 DIAGNOSIS — R072 Precordial pain: Secondary | ICD-10-CM | POA: Insufficient documentation

## 2017-06-16 DIAGNOSIS — R52 Pain, unspecified: Secondary | ICD-10-CM | POA: Diagnosis not present

## 2017-06-16 DIAGNOSIS — F1410 Cocaine abuse, uncomplicated: Secondary | ICD-10-CM

## 2017-06-16 DIAGNOSIS — R739 Hyperglycemia, unspecified: Secondary | ICD-10-CM

## 2017-06-16 DIAGNOSIS — E1165 Type 2 diabetes mellitus with hyperglycemia: Secondary | ICD-10-CM | POA: Diagnosis present

## 2017-06-16 DIAGNOSIS — M25512 Pain in left shoulder: Secondary | ICD-10-CM

## 2017-06-16 DIAGNOSIS — I959 Hypotension, unspecified: Secondary | ICD-10-CM | POA: Diagnosis not present

## 2017-06-16 DIAGNOSIS — E1100 Type 2 diabetes mellitus with hyperosmolarity without nonketotic hyperglycemic-hyperosmolar coma (NKHHC): Principal | ICD-10-CM

## 2017-06-16 LAB — CBC WITH DIFFERENTIAL/PLATELET
BASOS ABS: 0.1 10*3/uL (ref 0.0–0.1)
BASOS PCT: 1 %
Eosinophils Absolute: 0.2 10*3/uL (ref 0.0–0.7)
Eosinophils Relative: 3 %
HEMATOCRIT: 39.3 % (ref 39.0–52.0)
Hemoglobin: 12.8 g/dL — ABNORMAL LOW (ref 13.0–17.0)
LYMPHS PCT: 30 %
Lymphs Abs: 1.6 10*3/uL (ref 0.7–4.0)
MCH: 30.6 pg (ref 26.0–34.0)
MCHC: 32.6 g/dL (ref 30.0–36.0)
MCV: 94 fL (ref 78.0–100.0)
Monocytes Absolute: 0.6 10*3/uL (ref 0.1–1.0)
Monocytes Relative: 12 %
NEUTROS ABS: 2.8 10*3/uL (ref 1.7–7.7)
Neutrophils Relative %: 54 %
PLATELETS: 212 10*3/uL (ref 150–400)
RBC: 4.18 MIL/uL — AB (ref 4.22–5.81)
RDW: 13 % (ref 11.5–15.5)
WBC: 5.2 10*3/uL (ref 4.0–10.5)

## 2017-06-16 LAB — URINALYSIS, ROUTINE W REFLEX MICROSCOPIC
BACTERIA UA: NONE SEEN
BILIRUBIN URINE: NEGATIVE
Hgb urine dipstick: NEGATIVE
KETONES UR: 5 mg/dL — AB
LEUKOCYTES UA: NEGATIVE
NITRITE: NEGATIVE
PROTEIN: NEGATIVE mg/dL
SQUAMOUS EPITHELIAL / LPF: NONE SEEN
Specific Gravity, Urine: 1.028 (ref 1.005–1.030)
pH: 6 (ref 5.0–8.0)

## 2017-06-16 LAB — CBG MONITORING, ED
GLUCOSE-CAPILLARY: 529 mg/dL — AB (ref 65–99)
GLUCOSE-CAPILLARY: 300 mg/dL — AB (ref 65–99)
GLUCOSE-CAPILLARY: 199 mg/dL — AB (ref 65–99)
GLUCOSE-CAPILLARY: 165 mg/dL — AB (ref 65–99)
GLUCOSE-CAPILLARY: 168 mg/dL — AB (ref 65–99)
Glucose-Capillary: 552 mg/dL (ref 65–99)
Glucose-Capillary: 197 mg/dL — ABNORMAL HIGH (ref 65–99)
Glucose-Capillary: 99 mg/dL (ref 65–99)

## 2017-06-16 LAB — RAPID URINE DRUG SCREEN, HOSP PERFORMED
AMPHETAMINES: NOT DETECTED
Amphetamines: NOT DETECTED
BARBITURATES: NOT DETECTED
BENZODIAZEPINES: NOT DETECTED
BENZODIAZEPINES: NOT DETECTED
Barbiturates: NOT DETECTED
COCAINE: POSITIVE — AB
Cocaine: POSITIVE — AB
OPIATES: NOT DETECTED
Opiates: NOT DETECTED
TETRAHYDROCANNABINOL: NOT DETECTED
Tetrahydrocannabinol: NOT DETECTED

## 2017-06-16 LAB — GLUCOSE, CAPILLARY
Glucose-Capillary: 527 mg/dL (ref 65–99)
Glucose-Capillary: 253 mg/dL — ABNORMAL HIGH (ref 65–99)

## 2017-06-16 LAB — COMPREHENSIVE METABOLIC PANEL
ALK PHOS: 79 U/L (ref 38–126)
ALT: 21 U/L (ref 17–63)
AST: 33 U/L (ref 15–41)
Albumin: 3.5 g/dL (ref 3.5–5.0)
Anion gap: 13 (ref 5–15)
BUN: 16 mg/dL (ref 6–20)
CALCIUM: 8.5 mg/dL — AB (ref 8.9–10.3)
CO2: 21 mmol/L — AB (ref 22–32)
CREATININE: 0.92 mg/dL (ref 0.61–1.24)
Chloride: 95 mmol/L — ABNORMAL LOW (ref 101–111)
Glucose, Bld: 701 mg/dL (ref 65–99)
Potassium: 4.3 mmol/L (ref 3.5–5.1)
Sodium: 129 mmol/L — ABNORMAL LOW (ref 135–145)
Total Bilirubin: 0.7 mg/dL (ref 0.3–1.2)
Total Protein: 6.5 g/dL (ref 6.5–8.1)

## 2017-06-16 LAB — LACTIC ACID, PLASMA
LACTIC ACID, VENOUS: 2.2 mmol/L — AB (ref 0.5–1.9)
Lactic Acid, Venous: 2.7 mmol/L (ref 0.5–1.9)
Lactic Acid, Venous: 2.7 mmol/L (ref 0.5–1.9)

## 2017-06-16 LAB — BLOOD GAS, VENOUS
Acid-base deficit: 1.5 mmol/L (ref 0.0–2.0)
Bicarbonate: 22.9 mmol/L (ref 20.0–28.0)
DRAWN BY: 1356
FIO2: 21
O2 SAT: 85.5 %
PATIENT TEMPERATURE: 37
pCO2, Ven: 40.4 mmHg — ABNORMAL LOW (ref 44.0–60.0)
pH, Ven: 7.374 (ref 7.250–7.430)
pO2, Ven: 51.4 mmHg — ABNORMAL HIGH (ref 32.0–45.0)

## 2017-06-16 LAB — TROPONIN I

## 2017-06-16 LAB — SALICYLATE LEVEL: Salicylate Lvl: <7 mg/dL (ref 2.8–30.0)

## 2017-06-16 LAB — HEMOGLOBIN A1C
Hgb A1c MFr Bld: 10.9 % — ABNORMAL HIGH (ref 4.8–5.6)
Mean Plasma Glucose: 266.13 mg/dL

## 2017-06-16 LAB — PROCALCITONIN

## 2017-06-16 LAB — ACETAMINOPHEN LEVEL: Acetaminophen (Tylenol), Serum: <10 ug/mL — ABNORMAL LOW (ref 10–30)

## 2017-06-16 LAB — ETHANOL: Alcohol, Ethyl (B): <10 mg/dL (ref <10)

## 2017-06-16 MED ORDER — QUETIAPINE FUMARATE 100 MG PO TABS
100.0000 mg | ORAL_TABLET | Freq: Every day | ORAL | Status: DC
  Administered 2017-06-16: 100 mg via ORAL
  Filled 2017-06-16: qty 1

## 2017-06-16 MED ORDER — INSULIN ASPART 100 UNIT/ML ~~LOC~~ SOLN
15.0000 [IU] | Freq: Once | SUBCUTANEOUS | Status: AC
  Administered 2017-06-16: 15 [IU] via SUBCUTANEOUS

## 2017-06-16 MED ORDER — DEXTROSE-NACL 5-0.45 % IV SOLN
INTRAVENOUS | Status: DC
  Administered 2017-06-16: 03:00:00 via INTRAVENOUS

## 2017-06-16 MED ORDER — INSULIN ASPART 100 UNIT/ML ~~LOC~~ SOLN
12.0000 [IU] | Freq: Three times a day (TID) | SUBCUTANEOUS | Status: DC
  Administered 2017-06-16 – 2017-06-19 (×9): 12 [IU] via SUBCUTANEOUS
  Filled 2017-06-16 (×2): qty 1

## 2017-06-16 MED ORDER — SODIUM CHLORIDE 0.9 % IV SOLN
INTRAVENOUS | Status: DC
  Administered 2017-06-16: 4.9 [IU]/h via INTRAVENOUS
  Filled 2017-06-16 (×2): qty 1

## 2017-06-16 MED ORDER — ACETAMINOPHEN 325 MG PO TABS: 650.0000 mg | ORAL_TABLET | ORAL | Status: DC | PRN

## 2017-06-16 MED ORDER — FLUOXETINE HCL 20 MG PO CAPS
20.0000 mg | ORAL_CAPSULE | Freq: Every day | ORAL | Status: DC
  Administered 2017-06-17 – 2017-06-24 (×6): 20 mg via ORAL
  Filled 2017-06-16 (×12): qty 1

## 2017-06-16 MED ORDER — SODIUM CHLORIDE 0.9 % IV SOLN
INTRAVENOUS | Status: DC
  Administered 2017-06-16: 03:00:00 via INTRAVENOUS

## 2017-06-16 MED ORDER — ONDANSETRON HCL 4 MG PO TABS: 4.0000 mg | ORAL_TABLET | Freq: Four times a day (QID) | ORAL | Status: DC | PRN

## 2017-06-16 MED ORDER — SODIUM CHLORIDE 0.9 % IV BOLUS
1000.0000 mL | Freq: Once | INTRAVENOUS | Status: AC
  Administered 2017-06-16: 1000 mL via INTRAVENOUS

## 2017-06-16 MED ORDER — ACETAMINOPHEN 325 MG PO TABS
650.0000 mg | ORAL_TABLET | Freq: Four times a day (QID) | ORAL | Status: DC | PRN
  Administered 2017-06-17 – 2017-06-23 (×3): 650 mg via ORAL
  Filled 2017-06-16 (×3): qty 2

## 2017-06-16 MED ORDER — INSULIN ASPART 100 UNIT/ML ~~LOC~~ SOLN
0.0000 [IU] | Freq: Three times a day (TID) | SUBCUTANEOUS | Status: DC
  Administered 2017-06-17: 2 [IU] via SUBCUTANEOUS
  Administered 2017-06-17: 3 [IU] via SUBCUTANEOUS

## 2017-06-16 MED ORDER — PANTOPRAZOLE SODIUM 40 MG PO TBEC
40.0000 mg | DELAYED_RELEASE_TABLET | Freq: Every day | ORAL | Status: DC
  Administered 2017-06-16 – 2017-06-24 (×9): 40 mg via ORAL
  Filled 2017-06-16 (×10): qty 1

## 2017-06-16 MED ORDER — LISINOPRIL 10 MG PO TABS
10.0000 mg | ORAL_TABLET | Freq: Every day | ORAL | Status: DC
  Administered 2017-06-17 – 2017-06-24 (×8): 10 mg via ORAL
  Filled 2017-06-16 (×10): qty 1

## 2017-06-16 MED ORDER — ACETAMINOPHEN 650 MG RE SUPP: 650.0000 mg | Freq: Four times a day (QID) | RECTAL | Status: DC | PRN

## 2017-06-16 MED ORDER — HYDROXYZINE HCL 25 MG PO TABS
25.0000 mg | ORAL_TABLET | Freq: Three times a day (TID) | ORAL | Status: DC | PRN
Start: 2017-06-16 — End: 2017-06-24

## 2017-06-16 MED ORDER — ONDANSETRON HCL 4 MG/2ML IJ SOLN
4.0000 mg | Freq: Four times a day (QID) | INTRAMUSCULAR | Status: DC | PRN
  Administered 2017-06-23: 4 mg via INTRAVENOUS
  Filled 2017-06-16: qty 2

## 2017-06-16 MED ORDER — GABAPENTIN 300 MG PO CAPS
300.0000 mg | ORAL_CAPSULE | Freq: Three times a day (TID) | ORAL | Status: DC
Start: 2017-06-16 — End: 2017-06-24
  Administered 2017-06-16 – 2017-06-24 (×25): 300 mg via ORAL
  Filled 2017-06-16 (×20): qty 1
  Filled 2017-06-16: qty 3
  Filled 2017-06-16 (×5): qty 1

## 2017-06-16 MED ORDER — SODIUM CHLORIDE 0.9 % IV SOLN
INTRAVENOUS | Status: DC
  Administered 2017-06-16 – 2017-06-24 (×19): via INTRAVENOUS

## 2017-06-16 NOTE — ED Notes (Signed)
Patient was asked if he felt like hurting himself right now and he stated, " No I want to live, I just don't have anywhere to go if I leave the hospital."

## 2017-06-16 NOTE — ED Notes (Signed)
Date and time results received: 06/16/17 0425 (use smartphrase ".now" to insert current time)  Test: lactic acid Critical Value: 2.2  Name of Provider Notified: Dr Lynelle DoctorKnapp  Orders Received? Or Actions Taken?: Actions Taken: no orders received

## 2017-06-16 NOTE — ED Notes (Signed)
Date and time results received: 06/16/17 0420  Test: CBG Critical Value: 529  Name of Provider Notified: Lynelle DoctorKnapp, MD

## 2017-06-16 NOTE — H&P (Signed)
History and Physical    Kenneth [name] Kenneth [name] DOB: Aug 25, 1965 DOA: 06/16/2017  PCP: [name], MD  Patient coming from: Homeless  Chief Complaint: High sugar  HPI: Kenneth [name] is a 52 y.o. male with medical history significant of diabetes, homelessness, noncompliance comes in because of not feeling well.  His sugar was over 700.  He mentioned being suicidal in the ED so psychiatric evaluation was done who determined he was not suicidal.  Patient reports that the cocaine and his drug screen is a mistake and insist on it being repeated.  He adamantly refuses and insists that he does not do any illicit drugs.  He denies any nausea vomiting or diarrhea.  He denies any fevers.  He denies any cough.  In the emergency department he was had some soft blood pressures so was referred for admission for his low blood pressure with maps all above 65.  He was on insulin drip and his sugars have now improved and is been weaned off of the insulin drip already.  He was mildly with lactic acidosis at 2.7 this is improving.  Urinalysis is negative chest x-ray is also negative.  Patient is being referred for admission for soft blood pressures.   Review of Systems: As per HPI otherwise 10 point review of systems negative.   Past Medical History:  Diagnosis Date  . Arthritis   . Chronic generalized pain   . Diabetes mellitus   . DKA (diabetic ketoacidoses) ([place])   . DKA (diabetic ketoacidosis) ([place]) 07/2014  . Malnutrition ([place])   . Neuropathy   . Neuropathy   . Noncompliance with medication regimen   . Thyroid disease     History reviewed. No pertinent surgical history.   reports that he has been smoking.  He has a 4.00 pack-year smoking history. He has never used smokeless tobacco. He reports that he does not drink alcohol or use drugs.  Allergies  Allergen Reactions  . Metformin And Related Other (See Comments)    Abdominal cramps and diarrhea  . Nsaids Nausea Only    Family  History  Problem Relation Age of Onset  . Diabetes Mother   . Seizures Sister   . Diabetes Sister     Prior to Admission medications   Medication Sig Start Date End Date Taking? Authorizing Provider  Blood Glucose Monitoring Suppl (TRUE METRIX AIR GLUCOSE METER) w/Device KIT 1 each by Does not apply route 4 (four) times daily -  before meals and at bedtime. 03/25/16  Yes Dorena Dew, FNP  gabapentin (NEURONTIN) 300 MG capsule Take 1 capsule by mouth 3 (three) times daily. 03/25/16  Yes [provider]  glucose blood (TRUE METRIX BLOOD GLUCOSE TEST) test strip Use as instructed 03/25/16  Yes Dorena Dew, FNP  hydrOXYzine (ATARAX/VISTARIL) 25 MG tablet Take 1 tablet (25 mg total) by mouth 3 (three) times daily as needed for anxiety. 06/25/16  Yes [name], MD  insulin aspart (NOVOLOG) 100 UNIT/ML injection Inject 16 Units into the skin 3 (three) times daily with meals. Patient taking differently: Inject 12 Units into the skin 3 (three) times daily with meals.  06/25/16  Yes [name], MD  insulin glargine (LANTUS) 100 UNIT/ML injection Inject 0.13 mLs (13 Units total) into the skin at bedtime. Patient taking differently: Inject 50 Units into the skin at bedtime.  06/12/16  Yes Isaac Bliss, Rayford Halsted, MD  Insulin Pen Needle (BD ULTRA-FINE PEN NEEDLES) 29G X 12.7MM MISC 1 each by  Does not apply route 4 (four) times daily. 04/26/16  Yes Dorena Dew, FNP  lisinopril (ZESTRIL) 10 MG tablet Take 1 tablet (10 mg total) by mouth daily. 06/25/16  Yes [name], MD  oxycodone (OXY-IR) 5 MG capsule Take 5 mg by mouth every 4 (four) hours as needed.   Yes [provider]  FLUoxetine (PROZAC) 20 MG tablet Take 1 tablet by mouth daily. 04/06/16   [provider]  pantoprazole (PROTONIX) 40 MG tablet Take 1 tablet (40 mg total) by mouth daily. 06/12/16   Isaac Bliss, Rayford Halsted, MD  QUEtiapine (SEROQUEL) 100 MG tablet Take 1 tablet (100 mg total) by  mouth at bedtime. 11/30/16   [name], MD    Physical Exam: Vitals:   06/16/17 1430 06/16/17 1445 06/16/17 1500 06/16/17 1530  BP: 95/63  113/71 96/68  Pulse: 75 77 78 77  Resp: '18 19 20 19  '$ Temp:  98.2 F (36.8 C)    TempSrc:  Oral    SpO2: 97% 98% 98% 99%  Weight:      Height:          Constitutional: NAD, calm, comfortable Vitals:   06/16/17 1430 06/16/17 1445 06/16/17 1500 06/16/17 1530  BP: 95/63  113/71 96/68  Pulse: 75 77 78 77  Resp: '18 19 20 19  '$ Temp:  98.2 F (36.8 C)    TempSrc:  Oral    SpO2: 97% 98% 98% 99%  Weight:      Height:       Eyes: PERRL, lids and conjunctivae normal ENMT: Mucous membranes are moist. Posterior pharynx clear of any exudate or lesions.Normal dentition.  Neck: normal, supple, no masses, no thyromegaly Respiratory: clear to auscultation bilaterally, no wheezing, no crackles. Normal respiratory effort. No accessory muscle use.  Cardiovascular: Regular rate and rhythm, no murmurs / rubs / gallops. No extremity edema. 2+ pedal pulses. No carotid bruits.  Abdomen: no tenderness, no masses palpated. No hepatosplenomegaly. Bowel sounds positive.  Musculoskeletal: no clubbing / cyanosis. No joint deformity upper and lower extremities. Good ROM, no contractures. Normal muscle tone.  Skin: no rashes, lesions, ulcers. No induration Neurologic: CN 2-12 grossly intact. Sensation intact, DTR normal. Strength 5/5 in all 4.  Psychiatric: Normal judgment and insight. Alert and oriented x 3. Normal mood.    Labs on Admission: I have personally reviewed following labs and imaging studies  CBC: Recent Labs  Lab 06/16/17 0307  WBC 5.2  NEUTROABS 2.8  HGB 12.8*  HCT 39.3  MCV 94.0  PLT 951   Basic Metabolic Panel: Recent Labs  Lab 06/16/17 0307  NA 129*  K 4.3  CL 95*  CO2 21*  GLUCOSE 701*  BUN 16  CREATININE 0.92  CALCIUM 8.5*   GFR: Estimated Creatinine Clearance: 71.4 mL/min (by C-G formula based on SCr of 0.92  mg/dL). Liver Function Tests: Recent Labs  Lab 06/16/17 0307  AST 33  ALT 21  ALKPHOS 79  BILITOT 0.7  PROT 6.5  ALBUMIN 3.5   No results for input(s): LIPASE, AMYLASE in the last 168 hours. No results for input(s): AMMONIA in the last 168 hours. Coagulation Profile: No results for input(s): INR, PROTIME in the last 168 hours. Cardiac Enzymes: Recent Labs  Lab 06/16/17 0307  TROPONINI <0.03   BNP (last 3 results) No results for input(s): PROBNP in the last 8760 hours. HbA1C: No results for input(s): HGBA1C in the last 72 hours. CBG: Recent Labs  Lab 06/16/17 [phone] 06/16/17 0754 06/16/17  1210 06/16/17 1255 06/16/17 1330  GLUCAP 197* 199* 99 165* 168*   Lipid Profile: No results for input(s): CHOL, HDL, LDLCALC, TRIG, CHOLHDL, LDLDIRECT in the last 72 hours. Thyroid Function Tests: No results for input(s): TSH, T4TOTAL, FREET4, T3FREE, THYROIDAB in the last 72 hours. Anemia Panel: No results for input(s): VITAMINB12, FOLATE, FERRITIN, TIBC, IRON, RETICCTPCT in the last 72 hours. Urine analysis:    Component Value Date/Time   COLORURINE COLORLESS (A) 06/16/2017 0217   APPEARANCEUR CLEAR 06/16/2017 0217   LABSPEC 1.028 06/16/2017 0217   PHURINE 6.0 06/16/2017 0217   GLUCOSEU >=500 (A) 06/16/2017 0217   HGBUR NEGATIVE 06/16/2017 0217   BILIRUBINUR NEGATIVE 06/16/2017 0217   KETONESUR 5 (A) 06/16/2017 0217   PROTEINUR NEGATIVE 06/16/2017 0217   UROBILINOGEN 0.2 01/18/2015 2300   NITRITE NEGATIVE 06/16/2017 0217   LEUKOCYTESUR NEGATIVE 06/16/2017 0217   Sepsis Labs: !!!!!!!!!!!!!!!!!!!!!!!!!!!!!!!!!!!!!!!!!!!! '@LABRCNTIP'$ (procalcitonin:4,lacticidven:4) )No results found for this or any previous visit (from the past 240 hour(s)).   Radiological Exams on Admission: Dg Chest Port 1 View  Result Date: 06/16/2017 CLINICAL DATA:  Chest pain EXAM: PORTABLE CHEST 1 VIEW COMPARISON:  11/29/2016 FINDINGS: No focal pulmonary opacity or pleural effusion. Normal heart size.  No pneumothorax. IMPRESSION: No active disease. Electronically Signed   By: Donavan Foil M.D.   On: 06/16/2017 14:54    EKG: Independently reviewed.  Normal sinus rhythm no acute changes Chest x-ray reviewed no edema or infiltrate  Assessment/Plan 52 year old male with uncontrolled diabetes and positive cocaine in urine Principal Problem:   Diabetes mellitus with nonketotic hyperosmolarity (HCC)-patient has already been weaned weaned off insulin drip.  Continue sliding scale insulin.  Active Problems:   Volume depletion-IV fluids overnight.  Serial lactic acid level.  Per calcitonin level is negative.   Chronic generalized pain-noted   Non compliance w medication regimen-noted   Hypotension-improving with IV fluids Positive cocaine in urine we will repeat urine screen which would likely be positive again.   Social work will need to see patient to see if we can aid him in his medications and homeless status.  DVT prophylaxis: SCDs Code Status: Full Family Communication: None Disposition Plan: Per day team Consults called: None  Admission status: Observation   Anthonia Monger A MD [facility]  If 7PM-7AM, please contact night-coverage [email or web address] Password [facility]  06/16/2017, 4:02 PM

## 2017-06-16 NOTE — Consult Note (Signed)
  Tele Assessment    Kenneth [name], 52 y.o., male patient presented to [facility] with complaints of hight blood sugar.  Patient seen via telepsych by TTS and this provider; chart reviewed and consulted with Dr. Lucianne MussKumar on 06/16/17.  On evaluation Kenneth [name] reports that he is homeless and that he has have been having problems controlling his blood sugar.  When asked if he was suicidal patient stated that he did not want to say that he was suicidal but felt like giving up related to medical issues.  Patient reports that he is unable to control his diabetes related to being homeless and having nowhere to keep his insulin supplies patient feels that he may accidentally caused his death by not controlling his diabetes.  Patient denies active suicidal thoughts; patient also denies homicidal ideation, paranoia and psychosis.  Patient denies substance use disorder and positive UDS for cocaine is encouraged.  During evaluation Kenneth [name] is alert/oriented x 4; irritable; agitated with questions He does not appear to be responding to internal/external stimuli or delusional thoughts.  Patient denies suicidal/self-harm/homicidal ideation, psychosis, and paranoia; but feels that he may harm himself or accidentally kill himself related to not being able to control his diabetes.   Recommendations: Patient psychiatrically cleared.  Education on type 1 diabetes management and nutritional counseling.  Referral/resources for psychiatric outpatient services.  Disposition: No evidence of imminent risk to self or others at present.   Patient does not meet criteria for psychiatric inpatient admission. Discussed crisis plan, support from social network, calling 911, coming to the Emergency Department, and calling Suicide Hotline.
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
## 2017-06-16 NOTE — ED Notes (Signed)
[name] notified concerning blood pressure 83/52 that is trending downward.

## 2017-06-16 NOTE — ED Notes (Signed)
Report given to Val, RN on 300

## 2017-06-16 NOTE — ED Notes (Signed)
CRITICAL VALUE ALERT  Critical Value:  Glucose 701 Date & Time Notied:  06/16/17 Provider Notified: Lynelle DoctorKnapp Orders Received/Actions taken: Insulin Drip

## 2017-06-16 NOTE — ED Notes (Signed)
Dr. Onalee Huaavid notified of Lactic acid 2.7 at this time, no further orders.

## 2017-06-16 NOTE — ED Notes (Signed)
Date and time results received: 06/16/17 0532   Test: CBG Critical Value: 300  Name of Provider Notified: Lynelle DoctorKnapp. MD

## 2017-06-16 NOTE — ED Notes (Signed)
Date and time results received: 06/16/17 4:18 PM   Test: Lactic Acid Critical Value: 2.7  Name of Provider Notified: Onalee Huaavid  Orders Received? Or Actions Taken?: awaiting call back from Onalee HuaDavid, pt is transferring upstairs at this time

## 2017-06-16 NOTE — ED Provider Notes (Signed)
Patient turned over to me with a game plan to have psychiatric evaluation and overnight physician thought that psychiatry would probably admit for the suicidal ideation which was probably somewhat contrived since patient did not present with that.  Patient however did present with market hyperglycemia needing to go on insulin drip.  Elevated lactic acids.  As the days gone on psychiatry decided was not a candidate for admission probably would be medically cleared perhaps as well.  Lactic acid 1 from 2.2 of 2.7.  Blood gas without any acidosis on the pH.  No fevers.  However blood pressures have been marginal as low as 86 high like 101 and more recently more around 95.  Patient still not feeling well but he is getting an appetite back.  Patient is followed by [name].  Discussed with Dr. Onalee Huaavid from the hospitalist and due to the lactic acidosis and the marginal blood pressures she will admit.  Repeat lactic acid was ordered chest x-ray was negative for any infection.  They will admit.  Patient no longer on insulin drip.  Renal function normal.  No leukocytosis.   1 of the concerns was his chest pain but has had that for several days so the initial troponin being negative pretty much ruled out any acute cardiac event.   CRITICAL CARE Performed by: Vanetta MuldersZACKOWSKI,Brennah Quraishi Total critical care time: 30 minutes Critical care time was exclusive of separately billable procedures and treating other patients. Critical care was necessary to treat or prevent imminent or life-threatening deterioration. Critical care was time spent personally by me on the following activities: development of treatment plan with patient and/or surrogate as well as nursing, discussions with consultants, evaluation of patient's response to treatment, examination of patient, obtaining history from patient or surrogate, ordering and performing treatments and interventions, ordering and review of laboratory studies, ordering and review of  radiographic studies, pulse oximetry and re-evaluation of patient's condition.   Vanetta MuldersZackowski, Temple Ewart, MD 06/16/17 [id number]

## 2017-06-16 NOTE — ED Notes (Signed)
Notified MD [name] that pt was TTS today and note at 1020 by counselor stating pt can be d/c.

## 2017-06-16 NOTE — ED Notes (Signed)
Date and time results received: 06/16/17 0631   Test: Lactic Critical Value: 2.7  Name of Provider Notified: Lynelle DoctorKnapp, MD

## 2017-06-16 NOTE — ED Notes (Addendum)
Date and time results received: 06/16/17 0400   Test: Blood Glucose Critical Value: 529  Name of Provider Notified: Lynelle DoctorKnapp, MD

## 2017-06-16 NOTE — ED Triage Notes (Signed)
Pt c/o chest pain x one week. Pt blood sugar was 361 on ems.

## 2017-06-16 NOTE — ED Provider Notes (Signed)
[facility] EMERGENCY DEPARTMENT Provider Note   CSN: [id number] Arrival date & time: 06/16/17  0207  Time seen 02:20 AM   History   Chief Complaint Chief Complaint  Patient presents with  . Chest Pain    HPI Kenneth [name] is a 52 y.o. male.  HPI patient has a history of diabetes that is been very difficult to control.  He states his been feeling bad all week.  He states he had blood work done at his primary care doctor's office about 9 days ago and they called him and told him his CBG was 670.  He states if his blood sugar gets down into the normal range he will have seizures.  He states his CBG last night, 24 hours ago got down to 71 and he "fell out" on the floor.  His daughter gave him some sugary fluids and a cup cake to eat and he felt better.  He states after that he has not felt well.  He has had polyuria, polydipsia, he states his CBGs have been over 600 since that episode with his meter reading high.  He states his mouth is dry and he has had nausea and vomiting a few days with 2-3 episodes per day.  He has body aches all over including chest pain and abdominal pain.  He states when he eats he feels like his food gets stuck in his stomach and does not move.  He states his abdomen is sore from doing insulin injections.  He states he started having chest pain 2 nights ago and states he thinks is indigestion however he denies a burning sensation.  He states sometimes he is short of breath but not always.  He complains of neuropathy in his feet.  Patient states he is waiting on his disability to start.  He states he has no money and he is homeless.  He states he wants to give up.  He states sometimes he feels like he wants to hurt himself or hurt other people.  PCP [name], MD   Past Medical History:  Diagnosis Date  . Arthritis   . Chronic generalized pain   . Diabetes mellitus   . DKA (diabetic ketoacidoses) ([place])   . DKA (diabetic ketoacidosis) ([place]) 07/2014  .  Malnutrition ([place])   . Neuropathy   . Neuropathy   . Noncompliance with medication regimen   . Thyroid disease     Patient Active Problem List   Diagnosis Date Noted  . DKA, type 2 ([place]) 06/22/2016  . Non compliance w medication regimen   . Protein-calorie malnutrition, severe 06/11/2016  . Chronic generalized pain 04/26/2016  . Neuropathy 04/26/2016  . Diabetes mellitus type 2, uncontrolled, with complications ([place])   . MDD (major depressive disorder), recurrent severe, without psychosis ([place]) 03/27/2016  . Depression, recurrent ([place])   . Abdominal pain   . Hyperglycemia 02/24/2016  . Volume depletion 12/21/2015  . Metabolic acidosis 01/75/1025  . Diabetes mellitus with nonketotic hyperosmolarity ([place]) 09/21/2014  . Noncompliance with medication regimen   . Diabetes mellitus ([place])   . DKA (diabetic ketoacidoses) ([place]) 07/24/2014  . Dehydration 07/24/2014  . Acute renal failure ([place]) 07/24/2014  . Hyperkalemia 07/24/2014  . Hyponatremia 07/24/2014  . Hyperthyroidism 07/24/2014    History reviewed. No pertinent surgical history.      Home Medications    Prior to Admission medications   Medication Sig Start Date End Date Taking? Authorizing Provider  Blood Glucose Monitoring Suppl (TRUE METRIX AIR  GLUCOSE METER) w/Device KIT 1 each by Does not apply route 4 (four) times daily -  before meals and at bedtime. 03/25/16  Yes Dorena Dew, FNP  gabapentin (NEURONTIN) 300 MG capsule Take 1 capsule by mouth 3 (three) times daily. 03/25/16  Yes [provider]  glucose blood (TRUE METRIX BLOOD GLUCOSE TEST) test strip Use as instructed 03/25/16  Yes Dorena Dew, FNP  hydrOXYzine (ATARAX/VISTARIL) 25 MG tablet Take 1 tablet (25 mg total) by mouth 3 (three) times daily as needed for anxiety. 06/25/16  Yes [name], MD  insulin aspart (NOVOLOG) 100 UNIT/ML injection Inject 16 Units into the skin 3 (three) times daily with meals. Patient taking differently: Inject  12 Units into the skin 3 (three) times daily with meals.  06/25/16  Yes [name], MD  insulin glargine (LANTUS) 100 UNIT/ML injection Inject 0.13 mLs (13 Units total) into the skin at bedtime. Patient taking differently: Inject 50 Units into the skin at bedtime.  06/12/16  Yes Isaac Bliss, Rayford Halsted, MD  Insulin Pen Needle (BD ULTRA-FINE PEN NEEDLES) 29G X 12.7MM MISC 1 each by Does not apply route 4 (four) times daily. 04/26/16  Yes Dorena Dew, FNP  lisinopril (ZESTRIL) 10 MG tablet Take 1 tablet (10 mg total) by mouth daily. 06/25/16  Yes [name], MD  oxycodone (OXY-IR) 5 MG capsule Take 5 mg by mouth every 4 (four) hours as needed.   Yes [provider]  FLUoxetine (PROZAC) 20 MG tablet Take 1 tablet by mouth daily. 04/06/16   [provider]  pantoprazole (PROTONIX) 40 MG tablet Take 1 tablet (40 mg total) by mouth daily. 06/12/16   Isaac Bliss, Rayford Halsted, MD  QUEtiapine (SEROQUEL) 100 MG tablet Take 1 tablet (100 mg total) by mouth at bedtime. 11/30/16   [name], MD    Family History Family History  Problem Relation Age of Onset  . Diabetes Mother   . Seizures Sister   . Diabetes Sister     Social History Social History   Tobacco Use  . Smoking status: Current Every Day Smoker    Packs/day: 0.50    Years: 8.00    Pack years: 4.00  . Smokeless tobacco: Never Used  . Tobacco comment: states he hasn't smoked since 06/05/16  Substance Use Topics  . Alcohol use: No  . Drug use: No  unemployed Homeless Applying for disability   Allergies   Metformin and related and Nsaids   Review of Systems Review of Systems  All other systems reviewed and are negative.    Physical Exam Updated Vital Signs BP (!) 100/58   Pulse 73   Temp 97.9 F (36.6 C)   Resp 18   Ht '5\' 9"'$  (1.753 m)   Wt 54.4 kg (120 lb)   SpO2 100%   BMI 17.72 kg/m   Vital signs normal except borderline blood pressure   Physical Exam  Constitutional:  He is oriented to person, place, and time.  Non-toxic appearance. He does not appear ill. No distress.  Thin male who appears chronically ill  HENT:  Head: Normocephalic and atraumatic.  Right Ear: External ear normal.  Left Ear: External ear normal.  Nose: Nose normal. No mucosal edema or rhinorrhea.  Mouth/Throat: Mucous membranes are dry. No dental abscesses or uvula swelling.  Edentulous  Eyes: Pupils are equal, round, and reactive to light. Conjunctivae and EOM are normal.  Neck: Normal range of motion and full passive range of motion without  pain. Neck supple.  Cardiovascular: Normal rate, regular rhythm and normal heart sounds. Exam reveals no gallop and no friction rub.  No murmur heard. Pulmonary/Chest: Effort normal and breath sounds normal. No respiratory distress. He has no wheezes. He has no rhonchi. He has no rales. He exhibits no tenderness and no crepitus.  Abdominal: Soft. Normal appearance and bowel sounds are normal. He exhibits no distension. There is no tenderness. There is no rebound and no guarding.  Musculoskeletal: Normal range of motion. He exhibits no edema or tenderness.  Moves all extremities well.   Neurological: He is alert and oriented to person, place, and time. He has normal strength. No cranial nerve deficit.  Skin: Skin is warm, dry and intact. No rash noted. No erythema. No pallor.  Psychiatric: His speech is normal. He is slowed. He exhibits a depressed mood. He expresses homicidal and suicidal ideation. He expresses no suicidal plans and no homicidal plans.  Nursing note and vitals reviewed.    ED Treatments / Results  Labs (all labs ordered are listed, but only abnormal results are displayed) Results for orders placed or performed during the hospital encounter of 06/16/17  Comprehensive metabolic panel  Result Value Ref Range   Sodium 129 (L) 135 - 145 mmol/L   Potassium 4.3 3.5 - 5.1 mmol/L   Chloride 95 (L) 101 - 111 mmol/L   CO2 21 (L) 22 -  32 mmol/L   Glucose, Bld 701 (HH) 65 - 99 mg/dL   BUN 16 6 - 20 mg/dL   Creatinine, Ser 0.92 0.61 - 1.24 mg/dL   Calcium 8.5 (L) 8.9 - 10.3 mg/dL   Total Protein 6.5 6.5 - 8.1 g/dL   Albumin 3.5 3.5 - 5.0 g/dL   AST 33 15 - 41 U/L   ALT 21 17 - 63 U/L   Alkaline Phosphatase 79 38 - 126 U/L   Total Bilirubin 0.7 0.3 - 1.2 mg/dL   GFR calc non Af Amer >60 >60 mL/min   GFR calc Af Amer >60 >60 mL/min   Anion gap 13 5 - 15  Troponin I  Result Value Ref Range   Troponin I <0.03 <0.03 ng/mL  Lactic acid, plasma  Result Value Ref Range   Lactic Acid, Venous 2.2 (HH) 0.5 - 1.9 mmol/L  Lactic acid, plasma  Result Value Ref Range   Lactic Acid, Venous 2.7 (HH) 0.5 - 1.9 mmol/L  CBC with Differential  Result Value Ref Range   WBC 5.2 4.0 - 10.5 K/uL   RBC 4.18 (L) 4.22 - 5.81 MIL/uL   Hemoglobin 12.8 (L) 13.0 - 17.0 g/dL   HCT 39.3 39.0 - 52.0 %   MCV 94.0 78.0 - 100.0 fL   MCH 30.6 26.0 - 34.0 pg   MCHC 32.6 30.0 - 36.0 g/dL   RDW 13.0 11.5 - 15.5 %   Platelets 212 150 - 400 K/uL   Neutrophils Relative % 54 %   Neutro Abs 2.8 1.7 - 7.7 K/uL   Lymphocytes Relative 30 %   Lymphs Abs 1.6 0.7 - 4.0 K/uL   Monocytes Relative 12 %   Monocytes Absolute 0.6 0.1 - 1.0 K/uL   Eosinophils Relative 3 %   Eosinophils Absolute 0.2 0.0 - 0.7 K/uL   Basophils Relative 1 %   Basophils Absolute 0.1 0.0 - 0.1 K/uL  Urinalysis, Routine w reflex microscopic  Result Value Ref Range   Color, Urine COLORLESS (A) YELLOW   APPearance CLEAR CLEAR   Specific Gravity, Urine  1.028 1.005 - 1.030   pH 6.0 5.0 - 8.0   Glucose, UA >=500 (A) NEGATIVE mg/dL   Hgb urine dipstick NEGATIVE NEGATIVE   Bilirubin Urine NEGATIVE NEGATIVE   Ketones, ur 5 (A) NEGATIVE mg/dL   Protein, ur NEGATIVE NEGATIVE mg/dL   Nitrite NEGATIVE NEGATIVE   Leukocytes, UA NEGATIVE NEGATIVE   RBC / HPF 0-5 0 - 5 RBC/hpf   WBC, UA 0-5 0 - 5 WBC/hpf   Bacteria, UA NONE SEEN NONE SEEN   Squamous Epithelial / LPF NONE SEEN NONE SEEN   Blood gas, venous  Result Value Ref Range   FIO2 21.00    pH, Ven 7.374 7.250 - 7.430   pCO2, Ven 40.4 (L) 44.0 - 60.0 mmHg   pO2, Ven 51.4 (H) 32.0 - 45.0 mmHg   Bicarbonate 22.9 20.0 - 28.0 mmol/L   Acid-base deficit 1.5 0.0 - 2.0 mmol/L   O2 Saturation 85.5 %   Patient temperature 37.0    Collection site VEIN    Drawn by 1356   Acetaminophen level  Result Value Ref Range   Acetaminophen (Tylenol), Serum <10 (L) 10 - 30 ug/mL  Salicylate level  Result Value Ref Range   Salicylate Lvl <5.0 2.8 - 30.0 mg/dL  Ethanol  Result Value Ref Range   Alcohol, Ethyl (B) <10 <10 mg/dL  Urine rapid drug screen (hosp performed)  Result Value Ref Range   Opiates NONE DETECTED NONE DETECTED   Cocaine POSITIVE (A) NONE DETECTED   Benzodiazepines NONE DETECTED NONE DETECTED   Amphetamines NONE DETECTED NONE DETECTED   Tetrahydrocannabinol NONE DETECTED NONE DETECTED   Barbiturates NONE DETECTED NONE DETECTED  CBG monitoring, ED  Result Value Ref Range   Glucose-Capillary 552 (HH) 65 - 99 mg/dL  CBG monitoring, ED  Result Value Ref Range   Glucose-Capillary 529 (HH) 65 - 99 mg/dL  CBG monitoring, ED  Result Value Ref Range   Glucose-Capillary 300 (H) 65 - 99 mg/dL  CBG monitoring, ED  Result Value Ref Range   Glucose-Capillary 197 (H) 65 - 99 mg/dL  CBG monitoring, ED  Result Value Ref Range   Glucose-Capillary 199 (H) 65 - 99 mg/dL   Laboratory interpretation all normal except hyperglycemia without metabolic acidosis (anion gap is normal), he does have minimal lactic acidosis, positive UDS for cocaine, expected hyponatremia and low chloride consistent with his degree of hyperglycemia, mild anemia    EKG EKG Interpretation  Date/Time:  Thursday June 16 2017 02:15:09 EDT Ventricular Rate:  72 PR Interval:    QRS Duration: 84 QT Interval:  398 QTC Calculation: 436 R Axis:   114 Text Interpretation:  Sinus rhythm Lateral infarct, old Probable anteroseptal infarct, old Since  last tracing 29 Nov 2016 ST elevation now present in V2 and V3 Confirmed by Rolland Porter [phone]) on 06/16/2017 2:20:24 AM   Radiology No results found.  Procedures Procedures (including critical care time)  Medications Ordered in ED Medications  insulin regular (NOVOLIN R,HUMULIN R) 100 Units in sodium chloride 0.9 % 100 mL (1 Units/mL) infusion ( Intravenous Stopped 06/16/17 0725)  sodium chloride 0.9 % bolus 1,000 mL (0 mLs Intravenous Stopped 06/16/17 0428)    And  sodium chloride 0.9 % bolus 1,000 mL (0 mLs Intravenous Stopped 06/16/17 0526)    And  0.9 %  sodium chloride infusion ( Intravenous Stopped 06/16/17 0724)  0.9 %  sodium chloride infusion ( Intravenous Stopped 06/16/17 0725)  dextrose 5 %-0.45 % sodium chloride infusion ( Intravenous Stopped  06/16/17 0437)  FLUoxetine (PROZAC) tablet 20 mg (has no administration in time range)  pantoprazole (PROTONIX) EC tablet 40 mg (has no administration in time range)  QUEtiapine (SEROQUEL) tablet 100 mg (has no administration in time range)  gabapentin (NEURONTIN) capsule 300 mg (has no administration in time range)  hydrOXYzine (ATARAX/VISTARIL) tablet 25 mg (has no administration in time range)  insulin aspart (novoLOG) injection 12 Units (has no administration in time range)  lisinopril (PRINIVIL,ZESTRIL) tablet 10 mg (has no administration in time range)  acetaminophen (TYLENOL) tablet 650 mg (has no administration in time range)     Initial Impression / Assessment and Plan / ED Course  I have reviewed the triage vital signs and the nursing notes.  Pertinent labs & imaging results that were available during my care of the patient were reviewed by me and considered in my medical decision making (see chart for details).     Patient was given IV fluids, he appears to be dehydrated.  He was started on insulin drip.  Laboratory testing was done to check for DKA.  Troponin was done to evaluate his chest pain.  Chest x-ray was  done.  Recheck at 4:30 AM patient is on his third liter bolus of normal saline.  He is on his insulin drip.  However nurse was already starting his D5 drip, I pointed out it was supposed to only be started once his blood sugar got below 250.  His initial CBG was in the mid 500s and repeat after on insulin for an hour is also in the 500s.  Patient feels like he needs to be admitted.  I am going to see how his next CBG does and if it is not improving will talk to the hospitalist about admission.  5:30 AM patient CBG is now 300.  He has not in DKA, he will be discharged home.  However patient is made it very clear he is homeless and he wants to be admitted.  6:40 AM nurse reports his CBG now is down to 197.  His repeat lactic acid went up mildly to 2.7.  His insulin drip was stopped, he has 2 bags hanging going at 125 cc/h, they were changed to bolus.  Patient was giving some food to eat.  Patient states of his blood sugar gets close to 100 he starts feeling worse.  07:00 AM I talked to patient about discharge, he states he doesn't have somewhere to go, states "I am going to hurt myself or someone else". Cannot tell me what he will do. States "I'll so something".  It is obvious patient doesn't want to be discharged.   TTS and psychiatry consult was ordered.  Psychiatric holding orders were ordered.  I feel the patient is malingering because he does not have a place to go home to.  He keeps asking for pain medicine however he can take Tylenol or his gabapentin.  Review of the [place] shows patient was getting #120 oxycodone 5 mg tablets monthly and August, September, October, November and then the end of December it was up to 150 tablets a month for 2 months, then March 22 his doctor cut him back to 60 tablets.  These were all prescribed by his PCP.  Final Clinical Impressions(s) / ED Diagnoses   Final diagnoses:  Hyperglycemia  Cocaine abuse [facility])  Homeless    Disposition  pending  Rolland Porter, MD, Barbette Or, MD 06/16/17 [phone]

## 2017-06-17 LAB — BASIC METABOLIC PANEL
ANION GAP: 7 (ref 5–15)
BUN: 14 mg/dL (ref 6–20)
CHLORIDE: 108 mmol/L (ref 101–111)
CO2: 24 mmol/L (ref 22–32)
Calcium: 8.3 mg/dL — ABNORMAL LOW (ref 8.9–10.3)
Creatinine, Ser: 0.57 mg/dL — ABNORMAL LOW (ref 0.61–1.24)
GFR calc Af Amer: >60 mL/min (ref >60)
GFR calc non Af Amer: >60 mL/min (ref >60)
Glucose, Bld: 208 mg/dL — ABNORMAL HIGH (ref 65–99)
POTASSIUM: 4 mmol/L (ref 3.5–5.1)
SODIUM: 139 mmol/L (ref 135–145)

## 2017-06-17 LAB — GLUCOSE, CAPILLARY
GLUCOSE-CAPILLARY: 472 mg/dL — AB (ref 65–99)
GLUCOSE-CAPILLARY: 151 mg/dL — AB (ref 65–99)
Glucose-Capillary: 221 mg/dL — ABNORMAL HIGH (ref 65–99)
Glucose-Capillary: 164 mg/dL — ABNORMAL HIGH (ref 65–99)

## 2017-06-17 LAB — CBC
HEMATOCRIT: 37.4 % — AB (ref 39.0–52.0)
HEMOGLOBIN: 12.3 g/dL — AB (ref 13.0–17.0)
MCH: 31.1 pg (ref 26.0–34.0)
MCHC: 32.9 g/dL (ref 30.0–36.0)
MCV: 94.7 fL (ref 78.0–100.0)
Platelets: 172 10*3/uL (ref 150–400)
RBC: 3.95 MIL/uL — AB (ref 4.22–5.81)
RDW: 13.3 % (ref 11.5–15.5)
WBC: 6 10*3/uL (ref 4.0–10.5)

## 2017-06-17 LAB — TSH: TSH: 0.408 u[IU]/mL (ref 0.350–4.500)

## 2017-06-17 LAB — VITAMIN B12: VITAMIN B 12: 162 pg/mL — AB (ref 180–914)

## 2017-06-17 LAB — SEDIMENTATION RATE: SED RATE: 10 mm/h (ref 0–16)

## 2017-06-17 LAB — FOLATE: FOLATE: 10.8 ng/mL (ref >5.9)

## 2017-06-17 MED ORDER — INSULIN GLARGINE 100 UNIT/ML ~~LOC~~ SOLN
26.0000 [IU] | Freq: Every day | SUBCUTANEOUS | Status: DC
  Filled 2017-06-17 (×3): qty 0.26

## 2017-06-17 MED ORDER — INSULIN GLARGINE 100 UNIT/ML ~~LOC~~ SOLN
20.0000 [IU] | Freq: Once | SUBCUTANEOUS | Status: DC
  Filled 2017-06-17: qty 0.2

## 2017-06-17 MED ORDER — QUETIAPINE FUMARATE 100 MG PO TABS
200.0000 mg | ORAL_TABLET | Freq: Every day | ORAL | Status: DC
  Administered 2017-06-17 – 2017-06-23 (×7): 200 mg via ORAL
  Filled 2017-06-17 (×7): qty 2

## 2017-06-17 MED ORDER — INFLUENZA VAC SPLIT QUAD 0.5 ML IM SUSY
0.5000 mL | PREFILLED_SYRINGE | INTRAMUSCULAR | Status: DC
  Filled 2017-06-17 (×2): qty 0.5

## 2017-06-17 MED ORDER — INSULIN ASPART 100 UNIT/ML ~~LOC~~ SOLN
9.0000 [IU] | Freq: Once | SUBCUTANEOUS | Status: AC
  Administered 2017-06-17: 9 [IU] via SUBCUTANEOUS

## 2017-06-17 MED ORDER — KETOROLAC TROMETHAMINE 10 MG PO TABS
15.0000 mg | ORAL_TABLET | Freq: Two times a day (BID) | ORAL | Status: AC | PRN
  Administered 2017-06-17 – 2017-06-21 (×3): 15 mg via ORAL
  Filled 2017-06-17 (×3): qty 2

## 2017-06-17 NOTE — Clinical Social Work Note (Signed)
Patient declined shelter list stating that he does not do well around a lot of people.   LCSW signing off.     Honi Name, Juleen ChinaHeather D, LCSW

## 2017-06-17 NOTE — Progress Notes (Signed)
Patient admitted with nonketotic hyperosmolar state a history of bipolarity with hypomanic behavior chronic noncompliance his NovoLog with 15 units before meals 3 times daily and Lantus was 20 units at bedtime nightly not sure if he was taking it he is rather inconsistent.  I have increased his Lantus to 26 units at bedtime tonight continue sliding scale coverage of NovoLog monitor before meals and at bedtime glucoses patient wants to eat more we will increase his diet Tommy RainwaterRandy C Herne [id number] DOB: 03/17/1966 DOA: 06/16/2017 PCP: Oval Linseyondiego, Jayanth Szczesniak, MD   Physical Exam: Blood pressure 104/71, pulse 63, temperature 97.9 F (36.6 C), temperature source Oral, resp. rate 18, height 5\' 9"  (1.753 m), weight 54.4 kg (120 lb), SpO2 100 %.  Lungs show prolonged expiratory phase scattered rhonchi no rales no wheeze heart regular rhythm no S3-S4 no heaves thrills or rubs abdomen soft nontender bowel sounds normoactive   Investigations:  No results found for this or any previous visit (from the past 240 hour(s)).   Basic Metabolic Panel: Recent Labs    06/16/17 0307 06/17/17 0441  NA 129* 139  K 4.3 4.0  CL 95* 108  CO2 21* 24  GLUCOSE 701* 208*  BUN 16 14  CREATININE 0.92 0.57*  CALCIUM 8.5* 8.3*   Liver Function Tests: Recent Labs    06/16/17 0307  AST 33  ALT 21  ALKPHOS 79  BILITOT 0.7  PROT 6.5  ALBUMIN 3.5     CBC: Recent Labs    06/16/17 0307 06/17/17 0441  WBC 5.2 6.0  NEUTROABS 2.8  --   HGB 12.8* 12.3*  HCT 39.3 37.4*  MCV 94.0 94.7  PLT 212 172    Dg Chest Port 1 View  Result Date: 06/16/2017 CLINICAL DATA:  Chest pain EXAM: PORTABLE CHEST 1 VIEW COMPARISON:  11/29/2016 FINDINGS: No focal pulmonary opacity or pleural effusion. Normal heart size. No pneumothorax. IMPRESSION: No active disease. Electronically Signed   By: Jasmine PangKim  Fujinaga M.D.   On: 06/16/2017 14:54      Medications:   Impression:  Principal Problem:   Diabetes mellitus with nonketotic  hyperosmolarity (HCC) Active Problems:   Volume depletion   Chronic generalized pain   Non compliance w medication regimen   Hypotension     Plan: Increase Lantus to 26 units at bedtime continue sliding scale before meals and at bedtime NovoLog coverage will increase his diet to carb modified per patient request we will check thyroid function tests today.  I have increase Seroquel from 100-200 mg p.o. at bedtime for his hypomanic behavior  Consultants:    Procedures  Antibiotics:       Time spent: 30 minutes   LOS: 0 days   Mariza Bourget M   06/17/2017, 12:29 PM

## 2017-06-17 NOTE — Progress Notes (Signed)
CRITICAL VALUE ALERT  Critical Value:  CBG 472  Date & Time Notied:  06/17/2017@11 :37am  Provider Notified: Dr Janna Archondiego  Orders Received/Actions taken: yes

## 2017-06-17 NOTE — Progress Notes (Signed)
Midlevel MD notified about 253 cbg reading taken at 2141. Informed of no HS coverage and asked if pt needed coverage. No new orders. Will continue to monitor patient.

## 2017-06-17 NOTE — Care Management (Signed)
CM consulted for medication needs. Patient has Medicaid insurance which covers his medications and [facility] not [facility].  CM will sign off. CSW consulted for homelessness.

## 2017-06-17 NOTE — Progress Notes (Addendum)
Inpatient Diabetes Program Recommendations  AACE/ADA: New Consensus Statement on Inpatient Glycemic Control (2015)  Target Ranges:  Prepandial:   less than 140 mg/dL      Peak postprandial:   less than 180 mg/dL (1-2 hours)      Critically ill patients:  140 - 180 mg/dL  Results for Kenneth [name], Kenneth [name] (MRN [id number]) as of 06/17/2017 07:37  Ref. Range 06/16/2017 07:54 06/16/2017 12:10 06/16/2017 12:55 06/16/2017 13:30 06/16/2017 19:05 06/16/2017 21:41  Glucose-Capillary Latest Ref Range: 65 - 99 mg/dL 884199 (H) 99 166165 (H) 063168 (H) 527 (HH) 253 (H)  Results for Kenneth [name], Kenneth [name] (MRN [id number]) as of 06/17/2017 07:37  Ref. Range 06/16/2017 03:07  Glucose Latest Ref Range: 65 - 99 mg/dL 355701 [facility])   Results for Kenneth [name], Kenneth [name] (MRN [id number]) as of 06/17/2017 07:37  Ref. Range 06/16/2017 03:30  Hemoglobin A1C Latest Ref Range: 4.8 - 5.6 % 10.9 (H)    Review of Glycemic Control  Diabetes history: DM1 (makes no insulin; will require basal, correction, and meal coverage insulin) Outpatient Diabetes medications: Lantus 20 units daily, Novolog 12 units TID with meals Current orders for Inpatient glycemic control: Novolog 12 units TID with meals, Novolog 0-9 units TID with meals for correction  Inpatient Diabetes Program Recommendations: Insulin - Basal: Please consider ordering Lantus 15 units daily. Correction (SSI): Please consider ordering Novolog 0-5 units QHS for bedtime correction scale. Insulin - Meal Coverage: Please consider decreasing meal coverage to Novolog 6 units TID with meals. HgbA1C: A1C 10.9% on 06/16/17 indicating an average glucose of 266 mg/dl over the past 2-3 months.  NOTE: Patient's glucose noted to be 701 mg/dl on 09/24/21$[id number]$06/16/17@3 :07.  Patient was started on IV insulin drip in Emergency Room on 06/16/17 @ 3:15 am and IV insulin drip was discontinued at 7:25 am on 06/16/17. Patient was NOT GIVEN any basal insulin at time of transition off IV insulin drip and glucose back up to 527 mg/d at 19:05 on  06/16/17. Fasting glucose 208 mg/dl today. Patient will require basal insulin, correction, and meal coverage insulin; however anticipate Novolog 12 units TID is too much meal coverage insulin.   Addendum 06/17/17@13 :40-Spoke with patient about diabetes and home regimen for diabetes control. Patient reports that he is followed by PCP for diabetes management and currently he takes Lantus 20 units daily and Novolog 12 units TID with meals as an outpatient for diabetes control. Patient reports that he is taking insulin as prescribed; however he notes that when he talked with PCP today in the hospital that he said he was suppose to be taking Lantus 25 units daily.   Patient states that he checks his glucose and it is up and down. Patient reports that if his glucose gets in 80-120 mg/dl range he is "going down" and will have a seizure if it gets less than 70 mg/dl. Patient made the comment that the hospital can not even get his DM controlled as his glucose was down to 99 mg/dl yesterday and then back up to 527 mg/dl after he came off IV insulin. Explained that he did not receive any basal insulin at time of transition off IV insulin and as a result his glucose became elevated again. Also discussed glucose trends elevated today because he still has not gotten any basal insulin as MD has ordered for bedtime. However, patient received Novolog 15 units at 9:30 am and Novolog 21 units at 13:24 today for elevated glucose. Expect glucose to trend back down with Novolog insulin that  has been given today.   Discussed A1C results (10.9% on 06/16/17) and explained that his current A1C indicates an average glucose of 266 mg/dl over the past 2-3 months. Discussed glucose and A1C goals. Discussed importance of checking CBGs and maintaining good CBG control to prevent long-term and short-term complications. Explained how hyperglycemia leads to damage within blood vessels which lead to the common complications seen with uncontrolled  diabetes. Stressed to the patient the importance of improving glycemic control to prevent further complications from uncontrolled diabetes. Discussed impact of nutrition, exercise, stress, sickness, and medications on diabetes control. Patient reports that he is homeless, he has been trying to get disability for 5 months, and he feels frustrated and stressed about his DM control and being discharged from the hospital because he has no where to go. Inquired about going back to where he came from and patient states that he was staying with friends and he was a burden to them so he called EMS because he knew his glucose was high and he had no where else to go. Patient states that if he is discharged he will be on the streets. Inquired about insulin and patient states that his insulin is at his friends house and he does not know if he can get it or not.  Inquired about resources in the community such as homeless shelters. Patient states he can not be around all the people in a shelter and he can not go there.  Provided emotional support, encouraged patient to talk with family and friends to see if they can help him, and encouraged patient to do his best to take insulin, check glucose, and follow up with PCP as his doctor prescribes.  Patient verbalized understanding of information discussed and he states that he has no further questions at this time related to diabetes.  Thanks, Orlando Penner, RN, MSN, CDE Diabetes Coordinator Inpatient Diabetes Program [phone] (Team Pager from 8am to 5pm)

## 2017-06-18 ENCOUNTER — Observation Stay (HOSPITAL_COMMUNITY): Payer: Medicaid Other

## 2017-06-18 LAB — GLUCOSE, CAPILLARY
GLUCOSE-CAPILLARY: 473 mg/dL — AB (ref 65–99)
GLUCOSE-CAPILLARY: 251 mg/dL — AB (ref 65–99)
GLUCOSE-CAPILLARY: 120 mg/dL — AB (ref 65–99)
GLUCOSE-CAPILLARY: 289 mg/dL — AB (ref 65–99)
Glucose-Capillary: 590 mg/dL (ref 65–99)
Glucose-Capillary: 548 mg/dL (ref 65–99)
Glucose-Capillary: 436 mg/dL — ABNORMAL HIGH (ref 65–99)
Glucose-Capillary: 51 mg/dL — ABNORMAL LOW (ref 65–99)
Glucose-Capillary: 87 mg/dL (ref 65–99)

## 2017-06-18 LAB — BASIC METABOLIC PANEL
ANION GAP: 11 (ref 5–15)
BUN: 20 mg/dL (ref 6–20)
CALCIUM: 8.4 mg/dL — AB (ref 8.9–10.3)
CO2: 21 mmol/L — AB (ref 22–32)
Chloride: 99 mmol/L — ABNORMAL LOW (ref 101–111)
Creatinine, Ser: 0.95 mg/dL (ref 0.61–1.24)
GFR calc Af Amer: >60 mL/min (ref >60)
GLUCOSE: 630 mg/dL — AB (ref 65–99)
POTASSIUM: 4.8 mmol/L (ref 3.5–5.1)
Sodium: 131 mmol/L — ABNORMAL LOW (ref 135–145)

## 2017-06-18 LAB — HIV ANTIBODY (ROUTINE TESTING W REFLEX): HIV SCREEN 4TH GENERATION: NONREACTIVE

## 2017-06-18 LAB — RPR: RPR: NONREACTIVE

## 2017-06-18 MED ORDER — INSULIN ASPART 100 UNIT/ML ~~LOC~~ SOLN
0.0000 [IU] | Freq: Three times a day (TID) | SUBCUTANEOUS | Status: DC
  Administered 2017-06-18: 20 [IU] via SUBCUTANEOUS

## 2017-06-18 MED ORDER — VITAMIN B-12 100 MCG PO TABS
100.0000 ug | ORAL_TABLET | Freq: Every day | ORAL | Status: AC
  Administered 2017-06-18 – 2017-06-20 (×3): 100 ug via ORAL
  Filled 2017-06-18 (×3): qty 1

## 2017-06-18 MED ORDER — INSULIN ASPART 100 UNIT/ML ~~LOC~~ SOLN: 0.0000 [IU] | Freq: Every day | SUBCUTANEOUS | Status: DC

## 2017-06-18 MED ORDER — INSULIN ASPART 100 UNIT/ML ~~LOC~~ SOLN
10.0000 [IU] | Freq: Once | SUBCUTANEOUS | Status: AC
  Administered 2017-06-18: 10 [IU] via SUBCUTANEOUS

## 2017-06-18 MED ORDER — INSULIN GLARGINE 100 UNIT/ML ~~LOC~~ SOLN
20.0000 [IU] | Freq: Once | SUBCUTANEOUS | Status: AC
  Administered 2017-06-18: 20 [IU] via SUBCUTANEOUS
  Filled 2017-06-18: qty 0.2

## 2017-06-18 NOTE — Progress Notes (Signed)
Pt was found on the floor at shift change, around 1915. He said he had tried to get up to use the bathroom and he got dizzy and fell. He stated that he hit his head on the bedside table and landed on his right wrist. Pt was assessed and helped back to bed. Vital sign taken along with CBG. Vital were stable but blood sugar was 120. We gave the pt 2 cokes and 2 ice creams. We rechecked his blood sugar and it was 51. We gave him 2 more cokes and 2 more ice creams. Rechecked blood sugar and it was 87. Vital signs stable. Fall precautions put in place and fall mat placed. Dr. Truitt MerleNotified. He ordered a head CT.  Pt's blood sugar had been high all day. He had refused most of his insulin. He did take the scheduled 12 units at lunch and dinner but would not take the additional sliding scale because he said that much insulin would cause his blood sugar to bottom out.

## 2017-06-18 NOTE — Plan of Care (Signed)
progressing 

## 2017-06-18 NOTE — Progress Notes (Addendum)
Notified Dr. Janna Archondiego that pt's morning CBG was 473.  Dr. Janna Archondiego ordered 20 units Lantus and 10 units of Novolog STAT.  I was informed by Renae the night nurse that patient had refused his 26 units of  bedtime Lantus.

## 2017-06-18 NOTE — Progress Notes (Addendum)
The patient refused his at bedtime Lantus 26 units last night a.m. glucose was 473 patient has been uncooperative oppositional and noncompliant for a long time I informed him that I would not be party to his poor health.  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 daily for 3 days.  Patient was offered his choice of any other physician to resume his care would be happy to notify someone else as I am beginning to feel exceedingly uncomfortable with his self sabotaging behaviors. Tommy RainwaterRandy C Kapral [id number] DOB: 11/27/1965 DOA: 06/16/2017 PCP: Oval Linseyondiego, Zunaira Lamy, MD   Physical Exam: Blood pressure 111/74, pulse 72, temperature 98.4 F (36.9 C), temperature source Oral, resp. rate 18, height 5\' 9"  (1.753 m), weight 54.4 kg (120 lb), SpO2 99 %.  Lungs clear to A&P with a prolonged expiratory phase no rales wheeze or rhonchi appreciable heart regular rhythm no S3-S4 no heaves thrills or rubs abdomen soft nontender bowel sounds normoactive   Investigations:  No results found for this or any previous visit (from the past 240 hour(s)).   Basic Metabolic Panel: Recent Labs    06/16/17 0307 06/17/17 0441  NA 129* 139  K 4.3 4.0  CL 95* 108  CO2 21* 24  GLUCOSE 701* 208*  BUN 16 14  CREATININE 0.92 0.57*  CALCIUM 8.5* 8.3*   Liver Function Tests: Recent Labs    06/16/17 0307  AST 33  ALT 21  ALKPHOS 79  BILITOT 0.7  PROT 6.5  ALBUMIN 3.5     CBC: Recent Labs    06/16/17 0307 06/17/17 0441  WBC 5.2 6.0  NEUTROABS 2.8  --   HGB 12.8* 12.3*  HCT 39.3 37.4*  MCV 94.0 94.7  PLT 212 172    Dg Chest Port 1 View  Result Date: 06/16/2017 CLINICAL DATA:  Chest pain EXAM: PORTABLE CHEST 1 VIEW COMPARISON:  11/29/2016 FINDINGS: No focal  pulmonary opacity or pleural effusion. Normal heart size. No pneumothorax. IMPRESSION: No active disease. Electronically Signed   By: Jasmine PangKim  Fujinaga M.D.   On: 06/16/2017 14:54      Medications:   Impression:  Principal Problem:   Diabetes mellitus with nonketotic hyperosmolarity (HCC) Active Problems:   Volume depletion   Chronic generalized pain   Non compliance w medication regimen   Hypotension     Plan: Cyanocobalamin 100 mcg daily x3.  Lantus changed to 20 units at bedtime continue NovoLog 12 units before meals 3 times daily and sliding scale superimposed coverage before meals and at bedtime  Consultants:    Procedures   Antibiotics:         Time spent: 30 minutes   LOS: 0 days   Czarina Gingras M   06/18/2017, 9:39 AM

## 2017-06-19 LAB — GLUCOSE, CAPILLARY
GLUCOSE-CAPILLARY: 243 mg/dL — AB (ref 65–99)
GLUCOSE-CAPILLARY: 257 mg/dL — AB (ref 65–99)
GLUCOSE-CAPILLARY: 219 mg/dL — AB (ref 65–99)
Glucose-Capillary: 151 mg/dL — ABNORMAL HIGH (ref 65–99)
Glucose-Capillary: 286 mg/dL — ABNORMAL HIGH (ref 65–99)
Glucose-Capillary: 304 mg/dL — ABNORMAL HIGH (ref 65–99)
Glucose-Capillary: 367 mg/dL — ABNORMAL HIGH (ref 65–99)

## 2017-06-19 MED ORDER — INSULIN ASPART 100 UNIT/ML ~~LOC~~ SOLN
12.0000 [IU] | Freq: Three times a day (TID) | SUBCUTANEOUS | Status: DC
  Administered 2017-06-19 – 2017-06-20 (×4): 12 [IU] via SUBCUTANEOUS

## 2017-06-19 MED ORDER — INSULIN GLARGINE 100 UNIT/ML ~~LOC~~ SOLN
20.0000 [IU] | Freq: Every day | SUBCUTANEOUS | Status: DC
  Administered 2017-06-19 – 2017-06-22 (×4): 20 [IU] via SUBCUTANEOUS
  Filled 2017-06-19 (×4): qty 0.2

## 2017-06-19 NOTE — Progress Notes (Signed)
Events of previous 24 hours noted patient alert and oriented seems more cooperative today.  I have discontinued sliding scale coverage.  We will just give 12 units of NovoLog before meals 3 times daily which reflects his life outside of the hospital.  The patient is also agreed to take Lantus 20 units at bedtime nightly.  We will monitor his AC and at bedtime glucoses without reacting to them and decide on adjusting long and short-term insulin requirements over the next 24 hours.  His hemoglobin A1c was 10.6 which was performed yesterday.  Likewise I have increased his diet to 2 g sodium diet noncard modified as he is very thin and this reflects the reality of his daily intake at home.  CT scan of head results noted Kenneth [name] ZOX:[id number] DOB: 17-Aug-1965 DOA: 06/16/2017 PCP: Oval Linsey, MD   Physical Exam: Blood pressure 113/67, pulse 81, temperature 98.4 F (36.9 C), temperature source Oral, resp. rate 18, height 5\' 9"  (1.753 m), weight 54.4 kg (120 lb), SpO2 99 %.  Lungs clear to A&P with a prolonged expiratory phase no rales wheeze or rhonchi appreciable heart regular rhythm no S3-S4 no heaves thrills or rubs abdomen soft nontender bowel sounds normoactive  Investigations:  No results found for this or any previous visit (from the past 240 hour(s)).   Basic Metabolic Panel: Recent Labs    06/17/17 0441 06/18/17 1256  NA 139 131*  K 4.0 4.8  CL 108 99*  CO2 24 21*  GLUCOSE 208* 630*  BUN 14 20  CREATININE 0.57* 0.95  CALCIUM 8.3* 8.4*   Liver Function Tests: No results for input(s): AST, ALT, ALKPHOS, BILITOT, PROT, ALBUMIN in the last 72 hours.   CBC: Recent Labs    06/17/17 0441  WBC 6.0  HGB 12.3*  HCT 37.4*  MCV 94.7  PLT 172    Ct Head Wo Contrast  Result Date: 06/18/2017 CLINICAL DATA:  Syncopal episode on way to bathroom. Found down at 1915 hours. History of diabetes. EXAM: CT HEAD WITHOUT CONTRAST TECHNIQUE: Contiguous axial images were obtained from  the base of the skull through the vertex without intravenous contrast. COMPARISON:  CT HEAD June 14, 2013. FINDINGS: BRAIN: No intraparenchymal hemorrhage, mass effect nor midline shift. Borderline parenchymal brain volume loss for age. No hydrocephalus. No acute large vascular territory infarcts. No abnormal extra-axial fluid collections. Basal cisterns are patent. VASCULAR: Trace calcific atherosclerosis carotid siphons. SKULL/SOFT TISSUES: No skull fracture. No significant soft tissue swelling. ORBITS/SINUSES: The included ocular globes and orbital contents are normal.Mild paranasal sinus lobulated mucosal thickening without air-fluid levels. Mastoid air cells are well aerated. OTHER: None. IMPRESSION: 1. No acute intracranial process. 2. Borderline parenchymal brain volume loss for age. Electronically Signed   By: Awilda Metro M.D.   On: 06/18/2017 22:39      Medications:   Impression:  Principal Problem:   Diabetes mellitus with nonketotic hyperosmolarity (HCC) Active Problems:   Volume depletion   Chronic generalized pain   Non compliance w medication regimen   Hypotension     Plan: Discontinue sliding scale additional insulin.  NovoLog 12 units before meals 3 times daily with each meal.  Lantus 20 units at bedtime nightly.  Vitamin B12 as ordered.  Monitor glucoses before meals and at bedtime over the next 24 hours to adjust insulin dosage accordingly.  Diet is increased to 2 g sodium noncarb modified  Consultants:    Procedures   Antibiotics:  Time spent: 45 minutes   LOS: 0 days   Santita Hunsberger M   06/19/2017, 11:05 AM

## 2017-06-19 NOTE — Progress Notes (Signed)
Pt refused his sliding scale novolog at breakfast but he agreed to take his scheduled 12 units. He agreed to let me monitor him today.

## 2017-06-19 NOTE — Progress Notes (Signed)
Patient's mentation has been stable through out shift following his fall at 1915.  Patient haas remind alert and oriented x4, each time he was questioned.

## 2017-06-19 NOTE — Progress Notes (Signed)
Patient's insulin was held due to patient's BS dropped to 51.  [facility]  Patient states when CBG gets close to 120, he starts getting dizzy, nauseated, also stated that as his blood sugars go lower he has been known to have seizures. Day shift nurse stated that she spoke with doctor after patient's fall at 1915 and was told to hold all patient's insulin for the knight.  I could not find an order to support this, however patient has refused all insulin during the night. Will continue to monitor patient during the night.

## 2017-06-19 NOTE — Plan of Care (Signed)
Progressing

## 2017-06-19 NOTE — Progress Notes (Signed)
Patient argued over the amount of Lantus he was to get at bedtime.  Patient stated "if my blood sugar drops too low tonight I will sue all ya'll! I never told that doctor I would take it at all."  Told patient ok and gave him his 20 units of Lantus.  Patient continuously asks for Cokes, cereal with sugar, ice cream, etc.  Explained he really should limit the amount of sugar he is intaking.

## 2017-06-20 ENCOUNTER — Observation Stay (HOSPITAL_COMMUNITY): Payer: Medicaid Other

## 2017-06-20 LAB — GLUCOSE, CAPILLARY
GLUCOSE-CAPILLARY: 247 mg/dL — AB (ref 65–99)
GLUCOSE-CAPILLARY: 226 mg/dL — AB (ref 65–99)
GLUCOSE-CAPILLARY: 180 mg/dL — AB (ref 65–99)
GLUCOSE-CAPILLARY: 226 mg/dL — AB (ref 65–99)
Glucose-Capillary: 246 mg/dL — ABNORMAL HIGH (ref 65–99)
Glucose-Capillary: 237 mg/dL — ABNORMAL HIGH (ref 65–99)
Glucose-Capillary: 120 mg/dL — ABNORMAL HIGH (ref 65–99)
Glucose-Capillary: 269 mg/dL — ABNORMAL HIGH (ref 65–99)
Glucose-Capillary: 222 mg/dL — ABNORMAL HIGH (ref 65–99)

## 2017-06-20 MED ORDER — INSULIN ASPART 100 UNIT/ML ~~LOC~~ SOLN
14.0000 [IU] | Freq: Three times a day (TID) | SUBCUTANEOUS | Status: DC
  Administered 2017-06-20 – 2017-06-21 (×3): 14 [IU] via SUBCUTANEOUS

## 2017-06-20 NOTE — Clinical Social Work Note (Addendum)
LCSW provided patient with Homeless shelter list. Patient stated that he did not want to go to a shelter where he had to leave during the day. LCSW discussed that patient would have to discuss the policies and procedures with each facility. LCSW advised that transportation would be provided to the shelter.   LCSW signing off.     Kenneth [name], Juleen ChinaHeather D, LCSW

## 2017-06-20 NOTE — Progress Notes (Signed)
Patient refuses to take insulin unless he is given extra food and refuses to take 14 units of insulin stating that he does not his sugar to drop. Patient is given an extra tray at lunch and continues to c/o that he is not being given enough food.  Patient states he is aware that his sugar has to be below 250 three times before he can be discharged and that is what the MD is trying to do. Patient states he is homeless will contact the social worker to talk to the patient again.

## 2017-06-20 NOTE — Progress Notes (Signed)
Inpatient Diabetes Program Recommendations  AACE/ADA: New Consensus Statement on Inpatient Glycemic Control (2015)  Target Ranges:  Prepandial:   less than 140 mg/dL      Peak postprandial:   less than 180 mg/dL (1-2 hours)      Critically ill patients:  140 - 180 mg/dL   Results for Kenneth RainwaterVERNON, [place] C (MRN [id number]) as of 06/20/2017 07:30  Ref. Range 06/19/2017 04:34 06/19/2017 08:05 06/19/2017 11:14 06/19/2017 16:21 06/19/2017 20:38 06/19/2017 23:38 06/20/2017 04:09  Glucose-Capillary Latest Ref Range: 65 - 99 mg/dL 191151 (H) 478243 (H)  Novolog 12 units 286 (H)  Novolog 12 units 257 (H)  Novolog 12 units 219 (H)  Lantus 20 units 304 (H) 246 (H)   Review of Glycemic Control  Outpatient Diabetes medications: Lantus 20 units daily, Novolog 12 units TID with meals Current orders for Inpatient glycemic control: Lantus 20 units QHS, Novolog 12 units TID with meals  Inpatient Diabetes Program Recommendations: Insulin - Basal: Please consider increasing Lantus to 23 units QHS. Correction (SSI): Note Novolog correction scale was discontinued due to patient refusing and to make inpatient insulin orders similar to insulin regimen patient takes as an outpatient. Insulin - Meal Coverage: Please consider increasing meal coverage insulin to Novolog 14 units TID with meals. HgbA1C: A1C 10.9% on 06/16/17 indicating an average glucose of 266 mg/dl over the past 2-3 months. Diet: Note diet is Low Sodium NONcarb modified as MD ordered.  Thanks, Orlando PennerMarie Declyn Delsol, RN, MSN, CDE Diabetes Coordinator Inpatient Diabetes Program [phone] (Team Pager from 8am to 5pm)

## 2017-06-20 NOTE — Progress Notes (Signed)
[id number] DOB: 08/21/1965 DOA: 06/16/2017 PCP: Oval Linseyondiego, Kieli Golladay, MD   Physical Exam: Lungs clear to A&P no rales wheeze or rhonchi appreciable.  Heart regular rhythm no S3-S4 no heaves thrills or rubs abdomen soft nontender bowel sounds normoactive no guarding or rebound    Investigations:  No results found for this or any previous visit (from the past 240 hour(s)).   Basic Metabolic Panel: Recent Labs    06/18/17 1256  NA 131*  K 4.8  CL 99*  CO2 21*  GLUCOSE 630*  BUN 20  CREATININE 0.95  CALCIUM 8.4*   Liver Function Tests: No results for input(s): AST, ALT, ALKPHOS, BILITOT, PROT, ALBUMIN in the last 72 hours.   CBC: No results for input(s): WBC, NEUTROABS, HGB, HCT, MCV, PLT in the last 72 hours.  Ct Head Wo Contrast  Result Date: 06/18/2017 CLINICAL DATA:  Syncopal episode on way to bathroom. Found down at 1915 hours. History of diabetes. EXAM: CT HEAD WITHOUT CONTRAST TECHNIQUE: Contiguous axial images were obtained from the base of the skull through the vertex without intravenous contrast. COMPARISON:  CT HEAD June 14, 2013. FINDINGS: BRAIN: No intraparenchymal hemorrhage, mass effect nor midline shift. Borderline parenchymal brain volume loss for age. No hydrocephalus. No acute large vascular territory infarcts. No abnormal extra-axial fluid collections. Basal cisterns are patent. VASCULAR: Trace calcific atherosclerosis carotid siphons. SKULL/SOFT TISSUES: No skull fracture. No significant soft tissue swelling. ORBITS/SINUSES: The included ocular globes and orbital contents are normal.Mild paranasal sinus lobulated mucosal thickening without air-fluid levels. Mastoid air cells are well aerated. OTHER: None. IMPRESSION: 1. No acute intracranial process. 2. Borderline parenchymal brain volume loss for age. Electronically Signed   By: Awilda Metroourtnay  Bloomer M.D.   On: 06/18/2017 22:39      Medications:   Impression:  Principal Problem:   Diabetes mellitus with  nonketotic hyperosmolarity (HCC) Active Problems:   Volume depletion   Chronic generalized pain   Non compliance w medication regimen   Hypotension     Plan: Patient refuses to increase AC 3 times daily NovoLog from 12-14 units.  He is currently on a regular diet noncard modified.  He states he has severe pain in his left axilla and shoulder.  Will perform x-ray of this area.  Patient continues with oppositional behavior and self sabotaging passive aggressive behavior.  He states he is homeless now and if I send him out I am killing him.  Will seek social work intervention for this patient if possible  Consultants:    Procedures   Antibiotics:       Time spent: 45 minutes   LOS: 0 days   Dominik Lauricella M   06/20/2017, 12:24 PM

## 2017-06-21 LAB — GLUCOSE, CAPILLARY
GLUCOSE-CAPILLARY: 241 mg/dL — AB (ref 65–99)
GLUCOSE-CAPILLARY: 260 mg/dL — AB (ref 65–99)
GLUCOSE-CAPILLARY: 278 mg/dL — AB (ref 65–99)
GLUCOSE-CAPILLARY: 170 mg/dL — AB (ref 65–99)
GLUCOSE-CAPILLARY: 453 mg/dL — AB (ref 65–99)
GLUCOSE-CAPILLARY: 509 mg/dL — AB (ref 65–99)
Glucose-Capillary: 250 mg/dL — ABNORMAL HIGH (ref 65–99)
Glucose-Capillary: 422 mg/dL — ABNORMAL HIGH (ref 65–99)

## 2017-06-21 LAB — GLUCOSE, RANDOM: Glucose, Bld: 492 mg/dL — ABNORMAL HIGH (ref 65–99)

## 2017-06-21 MED ORDER — INSULIN ASPART 100 UNIT/ML ~~LOC~~ SOLN
15.0000 [IU] | Freq: Once | SUBCUTANEOUS | Status: AC
  Administered 2017-06-21: 15 [IU] via SUBCUTANEOUS

## 2017-06-21 NOTE — Progress Notes (Signed)
Patient has blood sugar finger stick of 509, and 453.  Stat lab was drawn and came back at 492.  Mid level paged and order received for 15 Units of Novolog.    Went to administer insulin to patient and he refused. Mid-level notified.

## 2017-06-21 NOTE — Progress Notes (Signed)
Inpatient Diabetes Program Recommendations  AACE/ADA: New Consensus Statement on Inpatient Glycemic Control (2015)  Target Ranges:  Prepandial:   less than 140 mg/dL      Peak postprandial:   less than 180 mg/dL (1-2 hours)      Critically ill patients:  140 - 180 mg/dL   Results for Kenneth [name], Kenneth [name] (MRN [id number]) as of 06/21/2017 07:52  Ref. Range 06/20/2017 08:06 06/20/2017 11:34 06/20/2017 15:23 06/20/2017 15:57 06/20/2017 16:58 06/20/2017 17:48 06/20/2017 18:16 06/20/2017 21:04 06/21/2017 05:11 06/21/2017 07:49  Glucose-Capillary Latest Ref Range: 65 - 99 mg/dL 409237 (H)  Novolog 12 units 247 (H)  Novolog 12 units 120 (H) 226 (H) 180 (H) 226 (H)  Novolog 14 units 269 (H) 222 (H)  Lantus 20 units 241 (H) 260 (H)   Review of Glycemic Control Outpatient Diabetes medications: Lantus 20units daily, Novolog 12units TID with meals Current orders for Inpatient glycemic control: Lantus 20 units QHS, Novolog 14 units TID with meals  Inpatient Diabetes Program Recommendations: Insulin - Basal: Please consider increasing Lantus to 23 units QHS. HgbA1C: A1C 10.9% on 06/16/17 indicating an average glucose of 266 mg/dl over the past 2-3 months. Diet: Note diet is Low Sodium NONcarb modified as MD ordered  NOTE: Noted RN notes that patient is "refusing to take insulin unless he is given extra food and refuses to take 14 units of insulin stating that he does not his sugar to drop. Patient is given an extra tray at lunch and continues to [name]/o that he is not being given enough food.  Patient states he is aware that his sugar has to be below 250 three times before he can be discharged and that is what the MD is trying to do." Unclear why patient thinks that glucose has to be below 250 mg/dl three times before he can be discharged. Noted patient has regular diet ordered, is asking for extra food, and refusing to take insulin at times unless he is given extra food which is leading to hyperglycemia.  If patient is stable for  discharge and not acidotic as determined by MD, anticipate patient could discharged even if glucose is over 250 mg/dl.   Thanks, Orlando PennerMarie Woodrow Dulski, RN, MSN, CDE Diabetes Coordinator Inpatient Diabetes Program [phone] (Team Pager from 8am to 5pm)

## 2017-06-21 NOTE — Progress Notes (Signed)
Patient continues to be oppositional refusing to take insulin then stating he is too sick to go home his average glucoses are between 240 and 260 he is not acidotic diabetic nurse coordinator agrees he can be discharged I feel he can be discharged patient refuses states he will kill himself if he is discharged he will not live in a homeless shelter.  I will arrange for tele-psych interview to see if he has suicidal ideation or not with subsequent discharge afterwards or admission to inpatient psych if they deem clinically necessary.  Trace of left shoulder and axilla are essentially unremarkable Tommy RainwaterRandy C [place] [id number] DOB: 04/19/1965 DOA: 06/16/2017 PCP: Oval Linseyondiego, Bodhi Stenglein, MD   Physical Exam: Blood pressure 111/67, pulse 78, temperature 98.2 F (36.8 C), resp. rate 18, height 5\' 9"  (1.753 m), weight 54.4 kg (120 lb), SpO2 100 %.  Lungs clear to A&P no rales wheeze or rhonchi heart regular rhythm no S3-S4 no heaves thrills or rubs abdomen soft nontender bowel sounds normoactive   Investigations:  No results found for this or any previous visit (from the past 240 hour(s)).   Basic Metabolic Panel: No results for input(s): NA, K, CL, CO2, GLUCOSE, BUN, CREATININE, CALCIUM, MG, PHOS in the last 72 hours. Liver Function Tests: No results for input(s): AST, ALT, ALKPHOS, BILITOT, PROT, ALBUMIN in the last 72 hours.   CBC: No results for input(s): WBC, NEUTROABS, HGB, HCT, MCV, PLT in the last 72 hours.  Dg Shoulder Left  Result Date: 06/20/2017 CLINICAL DATA:  Fall yesterday.  Pain. EXAM: LEFT SHOULDER - 2+ VIEW COMPARISON:  None. FINDINGS: No acute fracture. No dislocation.  Unremarkable soft tissues. IMPRESSION: No acute bony pathology. Electronically Signed   By: Jolaine ClickArthur  Hoss M.D.   On: 06/20/2017 18:52      Medications:   Impression:  Principal Problem:   Diabetes mellitus with nonketotic hyperosmolarity (HCC) Active Problems:   Volume depletion   Chronic generalized pain   Non  compliance w medication regimen   Hypotension     Plan: Arrange for tele-psych interview to determine if he is suicidal or not anticipate medical discharge if he is not or transfer to inpatient psych if he is suicidal  Consultants: [facility] psych requested   Procedures   Antibiotics:      Time spent: 30 minutes   LOS: 0 days   Jamie-Lee Galdamez M   06/21/2017, 2:34 PM

## 2017-06-21 NOTE — BH Assessment (Signed)
Tele Assessment Note   Patient Name: Kenneth [name] MRN: [id number] Referring Physician: Dr. [name], MD Location of Patient: [facility] Location of Provider: [place] is a 52 y.o. male who was admitted to the hospital due to high CBG numbers. The hospital made a TTS referral on 06/16/17 due to pt stating he was "at the end of his rope," though it was determined at that time that pt did not meet the criteria for inpatient hospitalization. Another TTS assessment was ordered today due to pt stating that, were the hospital to d/c him, he would "do what I gotta do."  Clinician contacted pt via Tele-Assessment. Pt shares he is in the hospital because his "sugar is out of whack." Pt states "I need help--I can't do nothing on my own." When asked for clarification of this, pt states he has applied for disability but that he has not been approved; pt shares he has applied for both mental health disability and physical health disability. Pt states he is mentally disabled due to having bipolar disorder and that he is physically disabled because he is unable to work due to having severe diabetes, arthritis, and neuropathy. Pt shares he is currently on bipolar medication (he states he's taking Seroquel and something else he can't remember), though he's "not sure" how they're working when asked. He states he's not hearing voices, though he has "bad nerves" and states he has lost a lot of weight. Pt states he also has bad nightmares.  Pt states he has been homeless for three months; he was previously in an apartment that was being funded by [employer], but the funding stopped when [employer] lost membership, and thus funding.  Pt denies HI, though he shares that if someone pushes him or instigates him he will fight back.  Pt states he is not currently experiencing AVH. He states his last hallucination was a week ago--he states he thought her heard his mother's  voice. Pt states he continues to have thoughts going through his mind and that the thoughts make him "scared to death." Pt states "I've done what I'm supposed to" in life, yet things are not working out for him. Pt states "I'm going to give up." He states, "I don't want to hurt myself."   Pt states that "any help would be great," so clinician offered to assist pt in being linked with a provider that could see him tomorrow. Pt denied this help, questioning how this would help him. Clinician stated that pt could benefit from having someone to talk to about his diagnoses, his frustration over lack of housing, and having someone to document for him regarding his mental health for his disability. Pt continues to disagree regarding how this would be beneficial for him, stating that he needs housing. Pt states, "I shouldn't have to be homeless, I'm disabled." and "I'd rather be a dead man than a live bum."  Pt reports he's been admitted into inpatient behavioral health on one occasion. Pt states he has a sister who is supportive of him, though she's also disabled, so her help is limited. 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 Pt denies any prior verbal, physical, or sexual abuse at the  hands of someone else.  Pt was questioned regarding SI. Pt states "if they send me out this door with nowhere to go I'm gonna do what I gotta do." When asked if pt has a plan, pt states, "if I do it, I ain't gonna tell no one," and that we would read about it in the papers.  A homeless shelter was contacted and a bed was acquired for pt; the specifics of the contact can be read in the note dated 06/21/17 at 1757/1833. Pt was again contacted and informed of the placement that was obtained for him and pt refused this  placement, stating that it wasn't going to help him. Clinician stated that pt was asking for help and has been offered both a therapist and now housing; reminded pt that housing is what he had expressed he must have prior to leaving the hospital and that he is now refusing it, so I don't understand what he needs. Stated it would be documented that pt was offered both therapy services and housing at a homeless shelter and that pt refused both. Pt stated that no one is helping him and continued to argue as clinician wished pt well, stated I was ending the call, and hung up.    Diagnosis: F29, Unspecified schizophrenia spectrum and other psychotic disorder    Past Medical History:  Past Medical History:  Diagnosis Date  . Arthritis   . Chronic generalized pain   . Diabetes mellitus   . DKA (diabetic ketoacidoses) ([place])   . DKA (diabetic ketoacidosis) ([place]) 07/2014  . Malnutrition ([place])   . Neuropathy   . Neuropathy   . Noncompliance with medication regimen   . Thyroid disease     History reviewed. No pertinent surgical history.  Family History:  Family History  Problem Relation Age of Onset  . Diabetes Mother   . Seizures Sister   . Diabetes Sister     Social History:  reports that he has been smoking.  He has a 4.00 pack-year smoking history. He has never used smokeless tobacco. He reports that he does not drink alcohol or use drugs.  Additional Social History:  Alcohol / Drug Use Pain Medications: Please see MAR Prescriptions: Please see MAR Over the Counter: Please see MAR History of alcohol / drug use?: No history of alcohol / drug abuse(Pt denies SA, though his UA came back positive for cocaine) Longest period of sobriety (when/how long): Unknown  CIWA: CIWA-Ar BP: 115/69 Pulse Rate: 70 COWS:    Allergies:  Allergies  Allergen Reactions  . Metformin And Related Other (See Comments)    Abdominal cramps and diarrhea  . Nsaids Nausea Only    Home Medications:   Medications Prior to Admission  Medication Sig Dispense Refill  . Blood Glucose Monitoring Suppl (TRUE METRIX AIR GLUCOSE METER) w/Device KIT 1 each by Does not apply route 4 (four) times daily -  before meals and at bedtime. 1 kit 0  . gabapentin (NEURONTIN) 300 MG capsule Take 1 capsule by mouth 3 (three) times daily.  2  . glucose blood (TRUE METRIX BLOOD GLUCOSE TEST) test strip Use as instructed 100 each 12  . insulin aspart (NOVOLOG) 100 UNIT/ML injection Inject 16 Units into the skin 3 (three) times daily with meals. (Patient taking differently: Inject 10 Units into the skin 3 (three) times daily with meals. ) 10 mL 11  . insulin glargine (LANTUS) 100 UNIT/ML injection Inject 0.13 mLs (13 Units total) into the skin at bedtime. (  Patient taking differently: Inject 25 Units into the skin at bedtime. ) 10 mL 11  . Insulin Pen Needle (BD ULTRA-FINE PEN NEEDLES) 29G X 12.7MM MISC 1 each by Does not apply route 4 (four) times daily. 100 each 5  . lisinopril (ZESTRIL) 10 MG tablet Take 1 tablet (10 mg total) by mouth daily. 30 tablet 2  . Methylnaltrexone Bromide (RELISTOR) 150 MG TABS Take 150 mg by mouth every other day.    Marland Kitchen oxycodone (OXY-IR) 5 MG capsule Take 5 mg by mouth every 4 (four) hours as needed.      OB/GYN Status:  No LMP for male patient.  General Assessment Data Location of Assessment: [facility] TTS Assessment: In system Is this a Tele or Face-to-Face Assessment?: Tele Assessment Is this an Initial Assessment or a Re-assessment for this encounter?: Initial Assessment Marital status: Divorced [place] name: [place] Is patient pregnant?: No Pregnancy Status: No Living Arrangements: Other (Comment)(Pt is homeless) Can pt return to current living arrangement?: Yes Admission Status: Voluntary Is patient capable of signing voluntary admission?: Yes Referral Source: MD Insurance type: Medicaid  Medical Screening Exam ([place]) Medical Exam completed: Yes  Crisis Care  Plan Living Arrangements: Other (Comment)(Pt is homeless) Legal Guardian: Other:(N/A) Name of Psychiatrist: N/A Name of Therapist: N/A  Education Status Is patient currently in school?: No Is the patient employed, unemployed or receiving disability?: Unemployed  Risk to self with the past 6 months Suicidal Ideation: No Has patient been a risk to self within the past 6 months prior to admission? : No Suicidal Intent: No Has patient had any suicidal intent within the past 6 months prior to admission? : No Is patient at risk for suicide?: Yes Suicidal Plan?: No Has patient had any suicidal plan within the past 6 months prior to admission? : No Specify Current Suicidal Plan: N/A Access to Means: (N/A) What has been your use of drugs/alcohol within the last 12 months?: Pt denies, though UA was positive for cocaine Previous Attempts/Gestures: No How many times?: 0 Other Self Harm Risks: Poor nutrition Triggers for Past Attempts: Other (Comment)(Homelessness) Intentional Self Injurious Behavior: None Family Suicide History: Unknown Recent stressful life event(s): Other (Comment)(Homelessness) Persecutory voices/beliefs?: No Depression: No Depression Symptoms: (None noted) Substance abuse history and/or treatment for substance abuse?: No Suicide prevention information given to non-admitted patients: Not applicable  Risk to Others within the past 6 months Homicidal Ideation: No Does patient have any lifetime risk of violence toward others beyond the six months prior to admission? : No Thoughts of Harm to Others: No Current Homicidal Intent: No Current Homicidal Plan: No Access to Homicidal Means: No Identified Victim: N/A History of harm to others?: No Assessment of Violence: On admission Violent Behavior Description: Pt states he will fight back if others instigate Does patient have access to weapons?: No Criminal Charges Pending?: No Does patient have a court date: No Is  patient on probation?: No  Psychosis Hallucinations: None noted Delusions: None noted  Mental Status Report Appearance/Hygiene: Disheveled, In scrubs Eye Contact: Fair Motor Activity: Agitation, Gestures Speech: Argumentative Level of Consciousness: Alert Mood: Suspicious, Irritable Affect: Appropriate to circumstance, Irritable, Threatening Anxiety Level: Minimal Thought Processes: Flight of Ideas, Circumstantial Judgement: Impaired Orientation: Person, Place, Time, Situation Obsessive Compulsive Thoughts/Behaviors: Moderate  Cognitive Functioning Concentration: Decreased Memory: Recent Intact, Remote Intact Is patient IDD: No Is patient DD?: No Insight: Poor Impulse Control: Poor Appetite: Good Have you had any weight changes? : No Change Sleep: No Change Total  Hours of Sleep: 7 Vegetative Symptoms: None  ADLScreening [facility] Assessment Services) Patient's cognitive ability adequate to safely complete daily activities?: Yes Patient able to express need for assistance with ADLs?: Yes Independently performs ADLs?: Yes (appropriate for developmental age)  Prior Inpatient Therapy Prior Inpatient Therapy: Yes Prior Therapy Dates: Unknown Prior Therapy Facilty/Provider(s): [facility] Reason for Treatment: MH  Prior Outpatient Therapy Prior Outpatient Therapy: No Does patient have an ACCT team?: No Does patient have Intensive In-House Services?  : No Does patient have [facility] services? : No Does patient have P4CC services?: No  ADL Screening (condition at time of admission) Patient's cognitive ability adequate to safely complete daily activities?: Yes Is the patient deaf or have difficulty hearing?: No Does the patient have difficulty seeing, even when wearing glasses/contacts?: No Does the patient have difficulty concentrating, remembering, or making decisions?: No Patient able to express need for assistance with ADLs?: Yes Does the patient have difficulty dressing or  bathing?: No Independently performs ADLs?: Yes (appropriate for developmental age) Does the patient have difficulty walking or climbing stairs?: Yes Weakness of Legs: Both Weakness of Arms/Hands: Both  Home Assistive Devices/Equipment Home Assistive Devices/Equipment: None  Therapy Consults (therapy consults require a physician order) PT Evaluation Needed: No OT Evalulation Needed: No SLP Evaluation Needed: No Abuse/Neglect Assessment (Assessment to be complete while patient is alone) Abuse/Neglect Assessment Can Be Completed: Yes Physical Abuse: Denies Verbal Abuse: Denies Sexual Abuse: Denies Exploitation of patient/patient's resources: Denies Self-Neglect: Denies Possible abuse reported to:: [place] Social Work Values / Beliefs Cultural Requests During Hospitalization: None Spiritual Requests During Hospitalization: None Consults Spiritual Care Consult Needed: No Social Work Consult Needed: No Regulatory affairs officer (For Healthcare) Does Patient Have a Catering manager?: No Would patient like information on creating a medical advance directive?: No - Patient declined Nutrition Screen- [facility] Adult/[facility] Has the patient recently lost weight without trying?: No Has the patient been eating poorly because of a decreased appetite?: No Malnutrition Screening Tool Score: 0  Additional Information 1:1 In Past 12 Months?: No CIRT Risk: No Elopement Risk: No Does patient have medical clearance?: Yes     Disposition: Marvia Pickles, NP, reviewed pt's chart and information and determined that pt does not meet the criteria for inpatient hospitalization. Pt was offered therapy services and a housing opportunity and refused both. Pt can be discharged after medically cleared. Contacted pt's nurse, Audrea Muscat, RN, at [phone] and provided her this information.  Disposition Initial Assessment Completed for this Encounter: Yes Disposition of Patient: Discharge Patient referred to:  Darnelle Maffucci Money, NP, states pt does not meet inpatient criteria)  This service was provided via telemedicine using a 2-way, interactive audio and video technology.  Names of all persons participating in this telemedicine service and their role in this encounter. Name: Molli Posey Role: Patient     Dannielle Burn 06/21/2017 9:59 PM

## 2017-06-21 NOTE — Progress Notes (Signed)
Contacted pt to inform him that a bed at a homeless shelter had been located for him at [facility] [employer] [phone]), which is in [place]. Pt inquired as to what it is, and clinician stated that pt would be provided a bed each night and that each day he would be transported to either the [facility] or to the [facility] and then picked up in the afternoon and transported back to the shelter. Pt raised his voice and lifted his arms in protest and questioned as to how this arrangement would help him. Reminded pt that I had earlier offered him therapy services, which he stated he was not interested in. Pt stated that he would not be able to take his medication (insulin) if he was to stay at the shelter/follow through with their requirements, and I reminded pt that his medication was able to be transported and did not require refrigeration at all times. Stated that there would also surely be refrigerators at the [facility] or the [facility] that he could utilize if he was concerned about this. Pt began rubbing his head with both of his hands and grumbling loudly. Inquired as to what about this homeless shelter placement would not work for pt, as he stated his only concern was that he would not have a place to stay and this shelter would rectify that situation. Pt stated that this shelter placement is not helping him but refused to identify as to how. Shared that this is the option pt was looking for and that, if he did not accept it, pt could end up with no where to go, which is what he stated he did not want to have happen. Pt stated that no one was willing to help him. Stated that I was going to document that pt was refusing this bed at the homeless shelter and pt again stated that it was not going to help him. Stated that I wished pt well and pt stated that no, I didn't, because I didn't do anything to help him.

## 2017-06-22 ENCOUNTER — Observation Stay (HOSPITAL_BASED_OUTPATIENT_CLINIC_OR_DEPARTMENT_OTHER): Payer: Medicaid Other

## 2017-06-22 DIAGNOSIS — I959 Hypotension, unspecified: Secondary | ICD-10-CM | POA: Diagnosis not present

## 2017-06-22 DIAGNOSIS — R079 Chest pain, unspecified: Secondary | ICD-10-CM | POA: Diagnosis not present

## 2017-06-22 DIAGNOSIS — G8929 Other chronic pain: Secondary | ICD-10-CM | POA: Diagnosis not present

## 2017-06-22 DIAGNOSIS — E1100 Type 2 diabetes mellitus with hyperosmolarity without nonketotic hyperglycemic-hyperosmolar coma (NKHHC): Secondary | ICD-10-CM | POA: Diagnosis not present

## 2017-06-22 DIAGNOSIS — E869 Volume depletion, unspecified: Secondary | ICD-10-CM | POA: Diagnosis not present

## 2017-06-22 LAB — GLUCOSE, CAPILLARY
GLUCOSE-CAPILLARY: 48 mg/dL — AB (ref 65–99)
GLUCOSE-CAPILLARY: 97 mg/dL (ref 65–99)
GLUCOSE-CAPILLARY: 79 mg/dL (ref 65–99)
GLUCOSE-CAPILLARY: 311 mg/dL — AB (ref 65–99)
Glucose-Capillary: 192 mg/dL — ABNORMAL HIGH (ref 65–99)
Glucose-Capillary: 259 mg/dL — ABNORMAL HIGH (ref 65–99)
Glucose-Capillary: 100 mg/dL — ABNORMAL HIGH (ref 65–99)
Glucose-Capillary: 80 mg/dL (ref 65–99)
Glucose-Capillary: 181 mg/dL — ABNORMAL HIGH (ref 65–99)

## 2017-06-22 LAB — TROPONIN I: Troponin I: <0.03 ng/mL (ref <0.03)

## 2017-06-22 LAB — LIPID PANEL
CHOL/HDL RATIO: 3.1 ratio
CHOLESTEROL: 186 mg/dL (ref 0–200)
HDL: 60 mg/dL (ref >40)
LDL Cholesterol: 112 mg/dL — ABNORMAL HIGH (ref 0–99)
Triglycerides: 68 mg/dL (ref <150)
VLDL: 14 mg/dL (ref 0–40)

## 2017-06-22 LAB — ECHOCARDIOGRAM COMPLETE
HEIGHTINCHES: 69 in
Weight: 1920 oz

## 2017-06-22 MED ORDER — GLUCOSE 40 % PO GEL
ORAL | Status: AC
  Filled 2017-06-22: qty 1

## 2017-06-22 MED ORDER — NITROGLYCERIN 0.4 MG/SPRAY TL SOLN
1.0000 | Status: DC | PRN
  Administered 2017-06-22: 1 via SUBLINGUAL
  Filled 2017-06-22 (×2): qty 4.9

## 2017-06-22 MED ORDER — INSULIN ASPART 100 UNIT/ML ~~LOC~~ SOLN
12.0000 [IU] | Freq: Three times a day (TID) | SUBCUTANEOUS | Status: DC
  Administered 2017-06-22 – 2017-06-24 (×6): 12 [IU] via SUBCUTANEOUS

## 2017-06-22 MED ORDER — DEXTROSE 50 % IV SOLN
INTRAVENOUS | Status: AC
  Filled 2017-06-22: qty 50

## 2017-06-22 MED ORDER — ASPIRIN 81 MG PO CHEW
81.0000 mg | CHEWABLE_TABLET | Freq: Every day | ORAL | Status: DC
  Administered 2017-06-22 – 2017-06-24 (×3): 81 mg via ORAL
  Filled 2017-06-22 (×3): qty 1

## 2017-06-22 NOTE — Progress Notes (Addendum)
Rechecked patient's blood sugar after 2 Coke drinks and it was 97.  Patient says he cannot believe that he did not have a seizure with a sugar that low. Will continue to monitor patient.  His breakfast is coming shortly.

## 2017-06-22 NOTE — Progress Notes (Addendum)
Patient was ready for discharge this a.m. he now complains of left sternal border and left axillary chest pain.  He is exceedingly oppositional angry screaming at Location managersocial worker nurse and myself.  Presumably he is situating for secondary gain to remain in the hospital as he is homeless.  He has sublingual nitroglycerin spray and aspirin 81 mg ordered we will do lipid profile to assess his lipid risk will obtain cardiology consult to discern whether or not this is coronary pain versus other?  Patient will perform any maneuver to remain in hospital it appears however we cannot get strong noncardiac chest pain which I do not believe this is.  Twelve-lead EKG performed revealed septal infarct age undetermined we will get 2D echo to assess wall motion abnormalities.  Troponins ordered serially at present. Tommy RainwaterRandy C Morgan [id number] DOB: 03/01/1966 DOA: 06/16/2017 PCP: Oval Linseyondiego, Catrell Morrone, MD   Physical Exam: Blood pressure 102/64, pulse 69, temperature 97.6 F (36.4 C), temperature source Oral, resp. rate 20, height 5\' 9"  (1.753 m), weight 54.4 kg (120 lb), SpO2 99 %.  Lungs show prolonged expiratory phase no rales wheeze or rhonchi appreciable heart regular rhythm no S3 or S4 in the usual rubs abdomen soft nontender bowel sounds normoactive   Investigations:  No results found for this or any previous visit (from the past 240 hour(s)).   Basic Metabolic Panel: Recent Labs    06/21/17 2152  GLUCOSE 492*   Liver Function Tests: No results for input(s): AST, ALT, ALKPHOS, BILITOT, PROT, ALBUMIN in the last 72 hours.   CBC: No results for input(s): WBC, NEUTROABS, HGB, HCT, MCV, PLT in the last 72 hours.  Dg Shoulder Left  Result Date: 06/20/2017 CLINICAL DATA:  Fall yesterday.  Pain. EXAM: LEFT SHOULDER - 2+ VIEW COMPARISON:  None. FINDINGS: No acute fracture. No dislocation.  Unremarkable soft tissues. IMPRESSION: No acute bony pathology. Electronically Signed   By: Jolaine ClickArthur  Hoss M.D.   On:  06/20/2017 18:52      Medications: Sublingual nitroglycerin spray added aspirin 81 mg p.o. daily on board.  Impression:  Principal Problem:   Diabetes mellitus with nonketotic hyperosmolarity (HCC) Active Problems:   Volume depletion   Chronic generalized pain   Non compliance w medication regimen   Hypotension     Plan: Lipid profile today sublingual nitroglycerin spray as needed twelve-lead EKG stat cardiology consultation to discern if this is cardiac chest pain or not in anticipation of discharge if the patient permits?  NovoLog insulin reduced to 12 units before meals 3 times daily.  2D echo to assess any wall motion abnormalities consistent with EKG anteroseptal old infarct age undetermined.  Troponins ordered sequentially at present.  Consultants: Cardiology requested   Procedures   Antibiotics:           Time spent: 45 minutes   LOS: 0 days   Marcheta Horsey M   06/22/2017, 11:02 AM

## 2017-06-22 NOTE — Progress Notes (Addendum)
Inpatient Diabetes Program Recommendations  AACE/ADA: New Consensus Statement on Inpatient Glycemic Control (2015)  Target Ranges:  Prepandial:   less than 140 mg/dL      Peak postprandial:   less than 180 mg/dL (1-2 hours)      Critically ill patients:  140 - 180 mg/dL  Results for Kenneth [name], Kenneth [name] (MRN [id number]) as of 06/22/2017 07:24  Ref. Range 06/21/2017 07:49 06/21/2017 11:08 06/21/2017 15:11 06/21/2017 16:43 06/21/2017 21:35 06/21/2017 21:43 06/21/2017 23:51 06/22/2017 01:14 06/22/2017 02:33 06/22/2017 05:07 06/22/2017 06:57  Glucose-Capillary Latest Ref Range: 65 - 99 mg/dL 409 (H)  Novolog 14 units 278 (H)  Novolog 14 units 170 (H) 250 (H)  NO Novolog with supper 509 (HH) 453 (H)  Lantus 20 units :49 422 (H)  Novolog 15 units @ 23:51 192 (H) 259 (H) 100 (H) 80    Review of Glycemic Control Outpatient Diabetes medications:Lantus 20units daily, Novolog 12units TID with meals Current orders for Inpatient glycemic control:Lantus 20 units QHS, Novolog 14 units TID with meals  Inpatient Diabetes Program Recommendations:  Insulin - Basal: Please consider increasing Lantus to 23 units QHS. Insulin - Meal Coverage: Patient did NOT receive any Novolog with supper on 06/21/17. As a result glucose up to 509 mg/dl at 81:19 last night.   HgbA1C: A1C 10.9% on 06/16/17 indicating an average glucose of 266 mg/dl over the past 2-3 months. Diet: Note diet is Low Sodium NONcarb modified as MD ordered.  NOTE: Noted patient's glucose up to 509 mg/dl last night after he ate supper and did not receive any Novolog with supper. Patient had initially refused to take Novolog 15 units for elevated glucose last night and one of the ICU nurses talked with patient and he finally agreed to take it at 23:51. Patient has been cleared by psych and glucose is 80 mg/dl this morning. Per chart review, noted patient has refused outpatient psych therapy services and housing at homeless shelter. Patient continues to be argumentative  with nursing staff about food and taking insulin as ordered.  Noted nursing staff tried to get patient to eat or drink something this morning when glucose was 100 mg/dl but refused and per RN note at 5:18 am today patient stated he "would show everyone that he would have a seizure". Also noted glucose of 48 mg/dl this morning at 1:47 am and RN noted patient refused Dextrose but stated he would drink a Coke. Anticipate hypoglycemia this morning from getting Novolog 15 units at 23:51 on 06/21/17 which was likely too much Novolog correction.     Thanks, Orlando Penner, RN, MSN, CDE Diabetes Coordinator Inpatient Diabetes Program [phone] (Team Pager from 8am to 5pm)

## 2017-06-22 NOTE — Progress Notes (Signed)
Pt is extremely disagreeable to treatments. Questioning why I gave him nitro spray when he has been reporting chest pain. I explained that the purpose is to dilate his vessels so that if his pain is heart related it will stop the pain. He was talking on his phone with his sister and I told him I would come back and talk and answer his questions when he was off the phone.

## 2017-06-22 NOTE — Progress Notes (Addendum)
Patient is very irate and abrasive. His sugar was 48 and I went into the room to give him IV Dextrose 50% and he refused it and said his Coke would bring it up enough.  Another nurse another attempted to give the Dextrose to him and he refused to except it from that nurse as well.   Will recheck pt's sugar once he has drunk his Coke.

## 2017-06-22 NOTE — Progress Notes (Signed)
Patient states he will begin to have seizures any moment now that blood sugar level has reached 100.  Patient refused to eat or drink anything.  Asked for a regular Coke, and was given a partial Coke to prevent blood sugar from dropping too low.  Patient refused to drink it, saying he would show everyone that he would have a seizure.

## 2017-06-22 NOTE — Progress Notes (Signed)
Patient reports chest pain, checked vital signs, BP 102/64, HR 69 bpm, O2 99% on room air, RR 20. Reported to Dr. Janna Archondiego who gave verbal order for 12-Lead EKG.

## 2017-06-22 NOTE — Progress Notes (Signed)
Ulla GalloJames Daniel, RN from ICU went to talk with patient and patient consented to take the Insulin 20 Units.  The patient's blood sugar levels will be followed throughout the night.

## 2017-06-22 NOTE — Progress Notes (Signed)
*  PRELIMINARY RESULTS* Echocardiogram 2D Echocardiogram has been performed.  Jeryl Columbialliott, Aubrey Voong 06/22/2017, 1:32 PM

## 2017-06-22 NOTE — Progress Notes (Signed)
Patient is ranting and raving about how some stole his ketchup and ranch dressing and that me and the nurse tech were the only ones in the room all morning.  I told him that we have not touched his dressing or ketchup and that we will get him more.  He says everyone up here is dishonest and that no one will tell him anything. I asked him what he would like to know and he said everything about his blood work and x-rays and he would not stop long enough for me to tell him anything, he just continued to accuse us of taking his condiments and saying no one will tell him the truth.  I told him, when he will listen I will tell him anything he wants to know and left him to eat his lunch alone.

## 2017-06-23 ENCOUNTER — Encounter (HOSPITAL_COMMUNITY): Payer: Self-pay | Admitting: Student

## 2017-06-23 DIAGNOSIS — I10 Essential (primary) hypertension: Secondary | ICD-10-CM | POA: Diagnosis not present

## 2017-06-23 DIAGNOSIS — R0789 Other chest pain: Secondary | ICD-10-CM

## 2017-06-23 LAB — GLUCOSE, CAPILLARY
GLUCOSE-CAPILLARY: 216 mg/dL — AB (ref 65–99)
Glucose-Capillary: 177 mg/dL — ABNORMAL HIGH (ref 65–99)
Glucose-Capillary: 188 mg/dL — ABNORMAL HIGH (ref 65–99)
Glucose-Capillary: 220 mg/dL — ABNORMAL HIGH (ref 65–99)
Glucose-Capillary: 295 mg/dL — ABNORMAL HIGH (ref 65–99)

## 2017-06-23 MED ORDER — INSULIN GLARGINE 100 UNIT/ML ~~LOC~~ SOLN
23.0000 [IU] | Freq: Every day | SUBCUTANEOUS | Status: DC
  Filled 2017-06-23: qty 0.23

## 2017-06-23 NOTE — Progress Notes (Signed)
Subjective: He says he still has pain in his chest.  I discussed his situation with Dr. Domenic Polite from cardiology and he does not need any further inpatient testing.  His chest pain seems like it may be chest wall pain.  He says he does not feel well in general  Objective: Vital signs in last 24 hours: Temp:  [97.6 F (36.4 C)-98.2 F (36.8 C)] 97.9 F (36.6 C) (04/04 0554) Pulse Rate:  [69-73] 70 (04/04 0554) Resp:  [16-20] 18 (04/04 0554) BP: (102-118)/(64-79) 103/71 (04/04 0554) SpO2:  [97 %-100 %] 97 % (04/04 0554) Weight change:  Last BM Date: 06/21/17  Intake/Output from previous day: 04/03 0701 - 04/04 0700 In: 3060 [P.O.:1560; I.V.:1500] Out: 3200 [Urine:3200]  PHYSICAL EXAM General appearance: alert, cooperative and no distress Resp: clear to auscultation bilaterally Cardio: regular rate and rhythm, S1, S2 normal, no murmur, click, rub or gallop GI: soft, non-tender; bowel sounds normal; no masses,  no organomegaly Extremities: extremities normal, atraumatic, no cyanosis or edema  Lab Results:  Results for orders placed or performed during the hospital encounter of 06/16/17 (from the past 48 hour(s))  Glucose, capillary     Status: Abnormal   Collection Time: 06/21/17 11:08 AM  Result Value Ref Range   Glucose-Capillary 278 (H) 65 - 99 mg/dL  Glucose, capillary     Status: Abnormal   Collection Time: 06/21/17  3:11 PM  Result Value Ref Range   Glucose-Capillary 170 (H) 65 - 99 mg/dL   Comment 1 Notify RN   Glucose, capillary     Status: Abnormal   Collection Time: 06/21/17  4:43 PM  Result Value Ref Range   Glucose-Capillary 250 (H) 65 - 99 mg/dL  Glucose, capillary     Status: Abnormal   Collection Time: 06/21/17  9:35 PM  Result Value Ref Range   Glucose-Capillary 509 (HH) 65 - 99 mg/dL   Comment 1 Notify RN    Comment 2 Document in Chart   Glucose, capillary     Status: Abnormal   Collection Time: 06/21/17  9:43 PM  Result Value Ref Range    Glucose-Capillary 453 (H) 65 - 99 mg/dL   Comment 1 Notify RN    Comment 2 Document in Chart   Glucose, random     Status: Abnormal   Collection Time: 06/21/17  9:52 PM  Result Value Ref Range   Glucose, Bld 492 (H) 65 - 99 mg/dL    Comment: Performed at [facility], [address] [place], [name] [address]  Glucose, capillary     Status: Abnormal   Collection Time: 06/21/17 11:51 PM  Result Value Ref Range   Glucose-Capillary 422 (H) 65 - 99 mg/dL  Glucose, capillary     Status: Abnormal   Collection Time: 06/22/17  1:14 AM  Result Value Ref Range   Glucose-Capillary 192 (H) 65 - 99 mg/dL  Glucose, capillary     Status: Abnormal   Collection Time: 06/22/17  2:33 AM  Result Value Ref Range   Glucose-Capillary 259 (H) 65 - 99 mg/dL   Comment 1 Notify RN    Comment 2 Document in Chart   Glucose, capillary     Status: Abnormal   Collection Time: 06/22/17  5:07 AM  Result Value Ref Range   Glucose-Capillary 100 (H) 65 - 99 mg/dL  Glucose, capillary     Status: None   Collection Time: 06/22/17  6:57 AM  Result Value Ref Range   Glucose-Capillary 80 65 - 99  mg/dL  Glucose, capillary     Status: Abnormal   Collection Time: 06/22/17  7:25 AM  Result Value Ref Range   Glucose-Capillary 48 (L) 65 - 99 mg/dL   Comment 1 Notify RN    Comment 2 Document in Chart   Glucose, capillary     Status: None   Collection Time: 06/22/17  7:49 AM  Result Value Ref Range   Glucose-Capillary 97 65 - 99 mg/dL  Lipid panel     Status: Abnormal   Collection Time: 06/22/17 11:40 AM  Result Value Ref Range   Cholesterol 186 0 - 200 mg/dL   Triglycerides 68 <150 mg/dL   HDL 60 >40 mg/dL   Total CHOL/HDL Ratio 3.1 RATIO   VLDL 14 0 - 40 mg/dL   LDL Cholesterol 112 (H) 0 - 99 mg/dL    Comment:        Total Cholesterol/HDL:CHD Risk Coronary Heart Disease Risk Table                     Men   Women  1/2 Average Risk   3.4   3.3  Average Risk       5.0   4.4  2 X Average Risk   9.6   7.1  3 X  Average Risk  23.4   11.0        Use the calculated Patient Ratio above and the CHD Risk Table to determine the patient's CHD Risk.        ATP III CLASSIFICATION (LDL):  <100     mg/dL   Optimal  100-129  mg/dL   Near or Above                    Optimal  130-159  mg/dL   Borderline  160-189  mg/dL   High  >190     mg/dL   Very High Performed at [facility], [address] [place], [place] [address]   Troponin I     Status: None   Collection Time: 06/22/17 11:40 AM  Result Value Ref Range   Troponin I <0.03 <0.03 ng/mL    Comment: Performed at [facility], [address] [place], [place] [address]  Glucose, capillary     Status: Abnormal   Collection Time: 06/22/17 12:10 PM  Result Value Ref Range   Glucose-Capillary 181 (H) 65 - 99 mg/dL   Comment 1 Notify RN    Comment 2 Document in Chart   Glucose, capillary     Status: None   Collection Time: 06/22/17  3:29 PM  Result Value Ref Range   Glucose-Capillary 79 65 - 99 mg/dL   Comment 1 Notify RN    Comment 2 Document in Chart   Troponin I     Status: None   Collection Time: 06/22/17  5:08 PM  Result Value Ref Range   Troponin I <0.03 <0.03 ng/mL    Comment: Performed at [facility], [address] [place], [place] [address]  Glucose, capillary     Status: Abnormal   Collection Time: 06/22/17  8:31 PM  Result Value Ref Range   Glucose-Capillary 311 (H) 65 - 99 mg/dL  Troponin I     Status: None   Collection Time: 06/22/17 11:10 PM  Result Value Ref Range   Troponin I <0.03 <0.03 ng/mL    Comment: Performed at [facility], [address] [place], [place] [address]  Glucose, capillary  Status: Abnormal   Collection Time: 06/23/17 12:16 AM  Result Value Ref Range   Glucose-Capillary 295 (H) 65 - 99 mg/dL  Glucose, capillary     Status: Abnormal   Collection Time: 06/23/17  7:50 AM  Result Value Ref Range   Glucose-Capillary 216 (H) 65 - 99 mg/dL   Comment 1 Notify RN    Comment 2 Document in Chart      ABGS No results for input(s): PHART, PO2ART, TCO2, HCO3 in the last 72 hours.  Invalid input(s): PCO2 CULTURES No results found for this or any previous visit (from the past 240 hour(s)). Studies/Results: No results found.  Medications:  Prior to Admission:  Medications Prior to Admission  Medication Sig Dispense Refill Last Dose  . Blood Glucose Monitoring Suppl (TRUE METRIX AIR GLUCOSE METER) w/Device KIT 1 each by Does not apply route 4 (four) times daily -  before meals and at bedtime. 1 kit 0 11/29/2016 at Unknown time  . gabapentin (NEURONTIN) 300 MG capsule Take 1 capsule by mouth 3 (three) times daily.  2 06/15/2017 at Unknown time  . glucose blood (TRUE METRIX BLOOD GLUCOSE TEST) test strip Use as instructed 100 each 12 11/29/2016 at Unknown time  . insulin aspart (NOVOLOG) 100 UNIT/ML injection Inject 16 Units into the skin 3 (three) times daily with meals. (Patient taking differently: Inject 10 Units into the skin 3 (three) times daily with meals. ) 10 mL 11 06/15/2017 at Unknown time  . insulin glargine (LANTUS) 100 UNIT/ML injection Inject 0.13 mLs (13 Units total) into the skin at bedtime. (Patient taking differently: Inject 25 Units into the skin at bedtime. ) 10 mL 11 06/15/2017 at Unknown time  . Insulin Pen Needle (BD ULTRA-FINE PEN NEEDLES) 29G X 12.7MM MISC 1 each by Does not apply route 4 (four) times daily. 100 each 5 11/29/2016 at Unknown time  . lisinopril (ZESTRIL) 10 MG tablet Take 1 tablet (10 mg total) by mouth daily. 30 tablet 2 06/15/2017 at Unknown time  . Methylnaltrexone Bromide (RELISTOR) 150 MG TABS Take 150 mg by mouth every other day.   06/15/2017 at Unknown time  . oxycodone (OXY-IR) 5 MG capsule Take 5 mg by mouth every 4 (four) hours as needed.   06/15/2017 at Unknown time   Scheduled: . aspirin  81 mg Oral Daily  . FLUoxetine  20 mg Oral Daily  . gabapentin  300 mg Oral TID  . Influenza vac split quadrivalent PF  0.5 mL Intramuscular Tomorrow-1000  .  insulin aspart  12 Units Subcutaneous TID WC  . insulin glargine  20 Units Subcutaneous QHS  . lisinopril  10 mg Oral Daily  . pantoprazole  40 mg Oral Daily  . QUEtiapine  200 mg Oral QHS   Continuous: . sodium chloride 125 mL/hr at 06/23/17 0546   JKD:[id number] **OR** acetaminophen, hydrOXYzine, nitroGLYCERIN, ondansetron **OR** ondansetron (ZOFRAN) IV  Assesment: He has chest pain that seems like chest wall pain.  No further cardiac testing indicated.  He has chronic pain at baseline.  He was admitted with diabetes with nonketotic hyperosmolarity.  His blood sugar is coming under better control but still has significant excursions.  I will increase Lantus  He was volume depleted which seems to have resolved Principal Problem:   Diabetes mellitus with nonketotic hyperosmolarity (HCC) Active Problems:   Volume depletion   Chronic generalized pain   Non compliance w medication regimen   Hypotension    Plan: Continue treatments    LOS: 0  days   Ender Rorke L 06/23/2017, 8:48 AM

## 2017-06-23 NOTE — Clinical Social Work Note (Signed)
Late Entry for 06/22/17:  Conference call with patient and his sister to discuss discharge plans. Patient again was not agreeable to go to the prearranged homeless shelter.   LCSW signing off.      Hamza Empson, Juleen ChinaHeather D, LCSW

## 2017-06-23 NOTE — Consult Note (Addendum)
Cardiology Consult    Patient ID: ALCARIO TINKEY; [id number]; 02-11-1966   Admit date: 06/16/2017 Date of Consult: 06/23/2017  Primary Care Provider: [name], MD Consulting Cardiologist: Dr. Domenic Polite   Patient Profile    Kenneth [name] is a 52 y.o. male with past medical history of uncontrolled IDDM, HTN, substance abuse, homelessness, and medical noncompliance who is being seen today for the evaluation of chest pain at the request of Dr. Cindie Laroche.   History of Present Illness    Kenneth [name] presented to [facility] ED on 06/16/2017 reporting chest pain, weakness and fatigue for the past week.  Noted his CBG had been elevated up to 670 when checked at his PCP the week prior.  Initial labs showed WBC 5.2, Hgb 12.8, platelets 212, Na+ 129, K+ 4.3, creatinine 0.92, and glucose 701.  UDS positive for cocaine.  Blood gas showed pH 7.126, CO2 31.5, O2 83.6, and bicarb 11.1.  Lactic acid 2.2.  Hgb A1c elevated at 10.9.  He was admitted for diabetes mellitus with nonketotic hyperosmolarity and was started on an insulin drip. By review of notes he has been weaned off of the insulin drip but has refused multiple doses of insulin throughout admission. Social work has been involved multiple times during his hospitalization to try to arrange for living quarters at the time of hospital discharge but he has refused. Psychiatry was also consulted during admission to see if he met inpatient criteria but determined he was clear for discharge once ready from the admitting team's perspective.   He was initially to be discharged yesterday morning but then reported episodes of left sternal border and left axillary chest pain. Cyclic troponin values were obtained at that time and have remained negative. EKG showed normal sinus rhythm, heart rate 67, with no acute ST or T wave changes when compared to prior tracings. Echocardiogram shows a preserved EF of 60-65% with no regional WMA.   In talking with the  patient today, he reports having pain radiating from his left scapular region along his ribs and to his left pectoral region. Says this has been constant since onset and is exacerbated when lying on his left side. He did fall a few days ago and is unsure if he landed on the left side. A plain film of the left shoulder was obtained and showed no acute findings. Symptoms are not exacerbated with exertion. No associated dyspnea, lightheadedness, dizziness, or presyncope. He denies any recent orthopnea, PND, or lower extremity edema.  He denies any personal history of CAD but reports both his mother and father had heart attacks.  He has known HTN and IDDM. Smokes less than 1 pack/week.   Past Medical History:  Diagnosis Date  . Arthritis   . Chronic generalized pain   . Diabetes mellitus   . DKA (diabetic ketoacidosis) ([place]) 07/2014  . Malnutrition ([place])   . Neuropathy   . Noncompliance with medication regimen   . Thyroid disease     History reviewed. No pertinent surgical history.   Home Medications:  Prior to Admission medications   Medication Sig Start Date End Date Taking? Authorizing Provider  Blood Glucose Monitoring Suppl (TRUE METRIX AIR GLUCOSE METER) w/Device KIT 1 each by Does not apply route 4 (four) times daily -  before meals and at bedtime. 03/25/16  Yes Dorena Dew, FNP  gabapentin (NEURONTIN) 300 MG capsule Take 1 capsule by mouth 3 (three) times daily. 03/25/16  Yes [provider]  glucose blood (TRUE METRIX BLOOD GLUCOSE TEST) test strip Use as instructed 03/25/16  Yes Dorena Dew, FNP  insulin aspart (NOVOLOG) 100 UNIT/ML injection Inject 16 Units into the skin 3 (three) times daily with meals. Patient taking differently: Inject 10 Units into the skin 3 (three) times daily with meals.  06/25/16  Yes [name], MD  insulin glargine (LANTUS) 100 UNIT/ML injection Inject 0.13 mLs (13 Units total) into the skin at bedtime. Patient taking differently:  Inject 25 Units into the skin at bedtime.  06/12/16  Yes Isaac Bliss, Rayford Halsted, MD  Insulin Pen Needle (BD ULTRA-FINE PEN NEEDLES) 29G X 12.7MM MISC 1 each by Does not apply route 4 (four) times daily. 04/26/16  Yes Dorena Dew, FNP  lisinopril (ZESTRIL) 10 MG tablet Take 1 tablet (10 mg total) by mouth daily. 06/25/16  Yes [name], Delfino Lovett, MD  Methylnaltrexone Bromide (RELISTOR) 150 MG TABS Take 150 mg by mouth every other day.   Yes [provider]  oxycodone (OXY-IR) 5 MG capsule Take 5 mg by mouth every 4 (four) hours as needed.   Yes [provider]    Inpatient Medications: Scheduled Meds: . aspirin  81 mg Oral Daily  . FLUoxetine  20 mg Oral Daily  . gabapentin  300 mg Oral TID  . Influenza vac split quadrivalent PF  0.5 mL Intramuscular Tomorrow-1000  . insulin aspart  12 Units Subcutaneous TID WC  . insulin glargine  20 Units Subcutaneous QHS  . lisinopril  10 mg Oral Daily  . pantoprazole  40 mg Oral Daily  . QUEtiapine  200 mg Oral QHS   Continuous Infusions: . sodium chloride 125 mL/hr at 06/23/17 0546   PRN Meds: acetaminophen **OR** acetaminophen, hydrOXYzine, nitroGLYCERIN, ondansetron **OR** ondansetron (ZOFRAN) IV  Allergies:    Allergies  Allergen Reactions  . Metformin And Related Other (See Comments)    Abdominal cramps and diarrhea  . Nsaids Nausea Only    Social History:   Social History   Socioeconomic History  . Marital status: Divorced    Spouse name: Not on file  . Number of children: Not on file  . Years of education: Not on file  . Highest education level: Not on file  Occupational History  . Not on file  Social Needs  . Financial resource strain: Not on file  . Food insecurity:    Worry: Not on file    Inability: Not on file  . Transportation needs:    Medical: Not on file    Non-medical: Not on file  Tobacco Use  . Smoking status: Current Every Day Smoker    Packs/day: 0.50    Years: 8.00    Pack years:  4.00  . Smokeless tobacco: Never Used  . Tobacco comment: states he hasn't smoked since 06/05/16  Substance and Sexual Activity  . Alcohol use: No  . Drug use: No  . Sexual activity: Not on file  Lifestyle  . Physical activity:    Days per week: Not on file    Minutes per session: Not on file  . Stress: Not on file  Relationships  . Social connections:    Talks on phone: Not on file    Gets together: Not on file    Attends religious service: Not on file    Active member of club or organization: Not on file    Attends meetings of clubs or organizations: Not on file    Relationship status: Not on file  .  Intimate partner violence:    Fear of current or ex partner: Not on file    Emotionally abused: Not on file    Physically abused: Not on file    Forced sexual activity: Not on file  Other Topics Concern  . Not on file  Social History Narrative  . Not on file     Family History:    Family History  Problem Relation Age of Onset  . Diabetes Mother   . Seizures Sister   . Diabetes Sister       Review of Systems    General:  No chills, fever, night sweats or weight changes. Positive for general fatigue.  Cardiovascular:  No  dyspnea on exertion, edema, orthopnea, palpitations, paroxysmal nocturnal dyspnea. Positive for chest pain.  Dermatological: No rash, lesions/masses Respiratory: No cough, dyspnea Urologic: No hematuria, dysuria Abdominal:   No nausea, vomiting, diarrhea, bright red blood per rectum, melena, or hematemesis Neurologic:  No visual changes, wkns, changes in mental status. All other systems reviewed and are otherwise negative except as noted above.  Physical Exam/Data    Vitals:   06/22/17 1043 06/22/17 1341 06/22/17 2044 06/23/17 0554  BP: 102/64 113/79 118/71 103/71  Pulse: 69 73 69 70  Resp: _0 Temp: 97.6 F (36.4 C) 98.1 F (36.7 C) 98.2 F (36.8 C) 97.9 F (36.6 C)  TempSrc: Oral Oral Oral Oral  SpO2: 99% 100% 99% 97%  Weight:       Height:        Intake/Output Summary (Last 24 hours) at 06/23/2017 0820 Last data filed at 06/23/2017 0400 Gross per 24 hour  Intake 2820 ml  Output 3200 ml  Net -380 ml   Filed Weights   06/16/17 0209  Weight: 120 lb (54.4 kg)   Body mass index is 17.72 kg/m.   General: Pleasant, thin-appearing Caucasian male, currently in NAD Psych: Normal affect. Neuro: Alert and oriented X 3. Moves all extremities spontaneously. HEENT: Normal  Neck: Supple without bruits or JVD. Lungs:  Resp regular and unlabored, CTA without wheezing or rales. Heart: RRR no s3, s4, or murmurs.  Tender to palpation along left pectoral region. Abdomen: Soft, non-tender, non-distended, BS + x 4.  Extremities: No clubbing, cyanosis or edema. DP/PT/Radials 2+ and equal bilaterally.   EKG:  The EKG was personally reviewed and demonstrates: NSR, HR 67, with no acute ST or T wave changes when compared to prior tracings  Telemetry:  Telemetry was personally reviewed and demonstrates: NSR, HR in 60's to 70's with no significant arrhythmias.    Labs/Studies     Relevant CV Studies:  Echocardiogram: 06/22/2017 Study Conclusions  - Left ventricle: The cavity size was normal. Wall thickness was   normal. Systolic function was normal. The estimated ejection   fraction was in the range of 60% to 65%. Wall motion was normal;   there were no regional wall motion abnormalities. Left   ventricular diastolic function parameters were normal. - Aortic valve: Valve area (VTI): 2.25 cm^2. Valve area (Vmax):   2.49 cm^2.  Laboratory Data:  Chemistry Recent Labs  Lab 06/17/17 0441 06/18/17 1256 06/21/17 2152  NA 139 131*  --   K 4.0 4.8  --   CL 108 99*  --   CO2 24 21*  --   GLUCOSE 208* 630* 492*  BUN 14 20  --   CREATININE 0.57* 0.95  --   CALCIUM 8.3* 8.4*  --   GFRNONAA >60 >60  --  GFRAA >60 >60  --   ANIONGAP 7 11  --     No results for input(s): PROT, ALBUMIN, AST, ALT, ALKPHOS, BILITOT in  the last 168 hours. Hematology Recent Labs  Lab 06/17/17 0441  WBC 6.0  RBC 3.95*  HGB 12.3*  HCT 37.4*  MCV 94.7  MCH 31.1  MCHC 32.9  RDW 13.3  PLT 172   Cardiac Enzymes Recent Labs  Lab 06/22/17 1140 06/22/17 1708 06/22/17 2310  TROPONINI <0.03 <0.03 <0.03   No results for input(s): TROPIPOC in the last 168 hours.  BNPNo results for input(s): BNP, PROBNP in the last 168 hours.  DDimer No results for input(s): DDIMER in the last 168 hours.  Radiology/Studies:  Dg Shoulder Left  Result Date: 06/20/2017 CLINICAL DATA:  Fall yesterday.  Pain. EXAM: LEFT SHOULDER - 2+ VIEW COMPARISON:  None. FINDINGS: No acute fracture. No dislocation.  Unremarkable soft tissues. IMPRESSION: No acute bony pathology. Electronically Signed   By: Marybelle Killings M.D.   On: 06/20/2017 18:52     Assessment & Plan    1. Atypical Chest Pain - Reports having episodes of chest pain over the past week with this recurring yesterday and having been persistent since onset. His pain is exacerbated when turning onto his left side and is reproducible with palpation. No association with exertion or food consumption. - Cyclic troponin values have been negative and EKG shows no acute ischemic changes. Echo shows a preserved EF with no regional wall motion abnormalities. Would not pursue further ischemic evaluation at this time in the setting of his atypical symptoms as they are most consistent with a MSK etiology.   2. Uncontrolled IDDM - Glucose elevated to 701 on admission. Hgb A1c 10.9. Glucose at 295 on most recent check.   - per admitting team.   3. HTN - BP has been stable at 102/64 - 118/79 within the past 24 hours. - Continue Lisinopril 10 mg daily.  4. Substance Use - UDS positive for cocaine upon admission but he denies any recent drug use. Does smoke approximately 1 pack of cigarettes per week. Cessation advised.   5. Social Issues/ Medical Noncompliance - The patient is currently homeless and is  waiting to be approved for disability. Social work is following and assisting with placement following hospital discharge, however the patient has refused their assistance many times.    For questions or updates, please contact [facility] Please consult [email or web address] for contact info under Cardiology/STEMI.  Signed, Erma Heritage, PA-C 06/23/2017, 8:20 AM Pager: [phone]   Attending note:  Patient seen and examined.  Reviewed records and discussed the case with Ms. Ahmed Prima PA-C as well as Dr. Luan Pulling.  Kenneth [name] reports atypical chest pain, left-sided with reproducibility on palpation.  Symptoms have not been exertional, he does mention that he had a fall recently which could be related.  He has a very difficult social situation, currently homeless and noncompliant with medical therapy, also with UDS positive for cocaine although he denies use.  On examination he is in no distress.  Heart rate is in the 60s in sinus rhythm which I personally reviewed, systolic blood pressure 953-202 range.  Lungs exhibit clear breath sounds without wheezing or rhonchi.  Cardiac exam reveals RRR without gallop.  Lab work shows normal troponin I levels x3, LDL 112, recent glucose in the 400 range.  I personally reviewed his ECG from 06/22/2017 which shows sinus rhythm with poor R wave progression, rule out old anterior infarct  pattern.  Echocardiogram however shows normal LVEF without wall motion abnormalities to correspond to ECG.  Patient reports atypical chest pain, doubt ischemic etiology, seems to be more musculoskeletal based on description and reproducibility with palpation.  He has ruled out for ACS and his echocardiogram is normal in terms of LVEF and no wall motion abnormalities.  No further ischemic testing is planned.  We will sign off.  Satira Sark, M.D., F.A.C.C.

## 2017-06-24 LAB — GLUCOSE, CAPILLARY
GLUCOSE-CAPILLARY: 382 mg/dL — AB (ref 65–99)
Glucose-Capillary: 315 mg/dL — ABNORMAL HIGH (ref 65–99)
Glucose-Capillary: 355 mg/dL — ABNORMAL HIGH (ref 65–99)

## 2017-06-24 MED ORDER — QUETIAPINE FUMARATE 200 MG PO TABS: 200.0000 mg | ORAL_TABLET | Freq: Every day | ORAL | 0 refills | Status: DC

## 2017-06-24 MED ORDER — INSULIN GLARGINE 100 UNIT/ML ~~LOC~~ SOLN
23.0000 [IU] | Freq: Every day | SUBCUTANEOUS | Status: DC
  Filled 2017-06-24 (×4): qty 0.23

## 2017-06-24 MED ORDER — INSULIN GLARGINE 100 UNIT/ML ~~LOC~~ SOLN: 23.0000 [IU] | Freq: Every day | SUBCUTANEOUS | 11 refills | Status: DC

## 2017-06-24 MED ORDER — INSULIN ASPART 100 UNIT/ML ~~LOC~~ SOLN: 12.0000 [IU] | Freq: Three times a day (TID) | SUBCUTANEOUS | 11 refills | Status: DC

## 2017-06-24 MED ORDER — FLUOXETINE HCL 20 MG PO TABS: 20.0000 mg | ORAL_TABLET | Freq: Every day | ORAL | 0 refills | Status: DC

## 2017-06-24 MED ORDER — ASPIRIN 81 MG PO CHEW: 81.0000 mg | CHEWABLE_TABLET | Freq: Every day | ORAL | 0 refills | Status: DC

## 2017-06-24 MED ORDER — PANTOPRAZOLE SODIUM 40 MG PO TBEC: 40.0000 mg | DELAYED_RELEASE_TABLET | Freq: Every day | ORAL | 2 refills | Status: DC

## 2017-06-24 NOTE — Clinical Social Work Note (Addendum)
LCSW spoke with patient and discussed plan to have cab take him to the pharmacy to pick up his medication and then to the [facility] where he will complete his intake for the [employer] [facility]. Patient agrees that he has no other option than to go to the homeless shelter. Patient became argumentative with his nurse regarding medication. Patient then became upset with LCSW and screamed "I never want to see you again in my life, heffa."    LCSW spoke with Teryl LucyMelissa Galloway with [facility]. Mrs. Donzetta MattersGalloway advised that patient needed to be at [facility] by 4:00 p.m for his intake. LCSW will arrange cab transportation to [facility] then to [facility].   LCSW signing off    Minda Faas, Juleen ChinaHeather D, LCSW

## 2017-06-24 NOTE — Progress Notes (Signed)
Inpatient Diabetes Program Recommendations  AACE/ADA: New Consensus Statement on Inpatient Glycemic Control (2015)  Target Ranges:  Prepandial:   less than 140 mg/dL      Peak postprandial:   less than 180 mg/dL (1-2 hours)      Critically ill patients:  140 - 180 mg/dL  Results for Kenneth RainwaterVERNON, Climmie C (MRN [id number]) as of 06/24/2017 07:18  Ref. Range 06/23/2017 07:50 06/23/2017 11:30 06/23/2017 16:28 06/23/2017 21:08  Glucose-Capillary Latest Ref Range: 65 - 99 mg/dL 956216 (H) 213177 (H) 086220 (H) 188 (H)    Review of Glycemic Control  Outpatient Diabetes medications: Lantus 20units daily, Novolog 12units TID with meals Current orders for Inpatient glycemic control:Lantus 23 units QHS, Novolog 12units TID with meals  Inpatient Diabetes Program Recommendations:  Insulin - Basal: Noted patient did NOT receive Lantus last night (charted as NOT GIVEN, patient refused). Anticipate fasting glucose will be elevated since patient did not take basal insulin last night as ordered. Recommend changing frequency of Lantus 23 units to daily so patient receives this morning.  Thanks, Orlando PennerMarie Taylynn Easton, RN, MSN, CDE Diabetes Coordinator Inpatient Diabetes Program [phone] (Team Pager from 8am to 5pm)

## 2017-06-24 NOTE — Progress Notes (Signed)
Discharge instructions gone over with patient, verbalized understanding. IV's removed by NT, patient tolerated procedure well. Per AC's recommendation patient is being wheeled down by RN with security as an escort. Printed prescription given to patient.

## 2017-06-24 NOTE — Progress Notes (Signed)
Patient became angry and confrontational when I offered to give him the dose of Lantus that Dr. Juanetta GoslingHawkins ordered him this morning. The patient questioned why he would have to take more insulin when he had just received his Novolog. I attempted to explain to him that the two insulins operate differently, but the patient refused to listen. I then left the room to allow the patient time to calm down. It appears that the patient is upset that he is being discharged today due to him being discharged to the homeless shelter. Will continue to monitor.

## 2017-06-24 NOTE — Clinical Social Work Note (Signed)
Patient's sister, Algis Limingngie Wilson, indicates that she will pick patient up between 1:30-2 and take him to [facility], take him to pick up his clothes and have him at the [facility] at 4:00 p.m. For his intake.   RN made aware.   Meredeth Furber, Juleen ChinaHeather D, LCSW

## 2017-06-24 NOTE — Discharge Summary (Signed)
Physician Discharge Summary  Patient ID: Kenneth [name] MRN: [id number] DOB/AGE: [birth year] 54 y.o. Primary Care Physician:[name], [name], MD Admit date: 06/16/2017 Discharge date: 06/24/2017    Discharge Diagnoses:   Principal Problem:   Diabetes mellitus with nonketotic hyperosmolarity ([place]) Active Problems:   Volume depletion   Chronic generalized pain   Non compliance w medication regimen   Hypotension Chest pain  Allergies as of 06/24/2017      Reactions   Metformin And Related Other (See Comments)   Abdominal cramps and diarrhea   Nsaids Nausea Only      Medication List    STOP taking these medications   oxycodone 5 MG capsule Commonly known as:  OXY-IR   RELISTOR 150 MG Tabs Generic drug:  Methylnaltrexone Bromide     TAKE these medications   aspirin 81 MG chewable tablet Chew 1 tablet (81 mg total) by mouth daily. Start taking on:  06/25/2017   FLUoxetine 20 MG tablet Commonly known as:  PROZAC Take 1 tablet (20 mg total) by mouth daily.   gabapentin 300 MG capsule Commonly known as:  NEURONTIN Take 1 capsule by mouth 3 (three) times daily.   glucose blood test strip Commonly known as:  TRUE METRIX BLOOD GLUCOSE TEST Use as instructed   insulin aspart 100 UNIT/ML injection Commonly known as:  novoLOG Inject 12 Units into the skin 3 (three) times daily with meals. What changed:  how much to take   insulin glargine 100 UNIT/ML injection Commonly known as:  LANTUS Inject 0.23 mLs (23 Units total) into the skin daily. What changed:    how much to take  when to take this   Insulin Pen Needle 29G X 12.7MM Misc Commonly known as:  BD ULTRA-FINE PEN NEEDLES 1 each by Does not apply route 4 (four) times daily.   lisinopril 10 MG tablet Commonly known as:  ZESTRIL Take 1 tablet (10 mg total) by mouth daily.   pantoprazole 40 MG tablet Commonly known as:  PROTONIX Take 1 tablet (40 mg total) by mouth daily.   QUEtiapine 200 MG tablet Commonly  known as:  SEROQUEL Take 1 tablet (200 mg total) by mouth at bedtime. What changed:    medication strength  how much to take   TRUE METRIX AIR GLUCOSE METER w/Device Kit 1 each by Does not apply route 4 (four) times daily -  before meals and at bedtime.       Discharged Condition: Improved    Consults: Cardiology  Significant Diagnostic Studies: Ct Head Wo Contrast  Result Date: 06/18/2017 CLINICAL DATA:  Syncopal episode on way to bathroom. Found down at 1915 hours. History of diabetes. EXAM: CT HEAD WITHOUT CONTRAST TECHNIQUE: Contiguous axial images were obtained from the base of the skull through the vertex without intravenous contrast. COMPARISON:  CT HEAD June 14, 2013. FINDINGS: BRAIN: No intraparenchymal hemorrhage, mass effect nor midline shift. Borderline parenchymal brain volume loss for age. No hydrocephalus. No acute large vascular territory infarcts. No abnormal extra-axial fluid collections. Basal cisterns are patent. VASCULAR: Trace calcific atherosclerosis carotid siphons. SKULL/SOFT TISSUES: No skull fracture. No significant soft tissue swelling. ORBITS/SINUSES: The included ocular globes and orbital contents are normal.Mild paranasal sinus lobulated mucosal thickening without air-fluid levels. Mastoid air cells are well aerated. OTHER: None. IMPRESSION: 1. No acute intracranial process. 2. Borderline parenchymal brain volume loss for age. Electronically Signed   By: Elon Alas M.D.   On: 06/18/2017 22:39   Dg Chest [facility] 1 View  Result  Date: 06/16/2017 CLINICAL DATA:  Chest pain EXAM: PORTABLE CHEST 1 VIEW COMPARISON:  11/29/2016 FINDINGS: No focal pulmonary opacity or pleural effusion. Normal heart size. No pneumothorax. IMPRESSION: No active disease. Electronically Signed   By: Donavan Foil M.D.   On: 06/16/2017 14:54   Dg Shoulder Left  Result Date: 06/20/2017 CLINICAL DATA:  Fall yesterday.  Pain. EXAM: LEFT SHOULDER - 2+ VIEW COMPARISON:  None. FINDINGS:  No acute fracture. No dislocation.  Unremarkable soft tissues. IMPRESSION: No acute bony pathology. Electronically Signed   By: Marybelle Killings M.D.   On: 06/20/2017 18:52    Lab Results: Basic Metabolic Panel: Recent Labs    06/21/17 2152  GLUCOSE 492*   Liver Function Tests: No results for input(s): AST, ALT, ALKPHOS, BILITOT, PROT, ALBUMIN in the last 72 hours.   CBC: No results for input(s): WBC, NEUTROABS, HGB, HCT, MCV, PLT in the last 72 hours.  No results found for this or any previous visit (from the past 240 hour(s)).   Hospital Course: This is a 52 year old male patient of Dr. Delfino Lovett [name]'s who has history of diabetes homelessness noncompliance with medical treatment and who came to the emergency department because his blood sugar was elevated over 700.  While he was in the emergency department he mentioned being suicidal so he had psychiatric evaluation done and it was determined that he was not suicidal.  He had cocaine in his urine drug screen and he insists that he does not do illicit drugs.  His blood sugar was elevated he was given IV fluids started on insulin drip and improved.  He was being readied for discharge when he developed chest discomfort that seemed to be more chest wall pain but because of his risk factors etc. cardiology consultation was obtained.  It was felt that he did not need any further workup of his chest pain at this time.  He continued to have generalized malaise and complaints of not feeling well but nothing very specific.  He refused his Lantus on the night prior to discharge.  He is at maximum hospital benefit and will be discharged  Discharge Exam: Blood pressure 109/68, pulse 69, temperature 97.8 F (36.6 C), temperature source Oral, resp. rate 20, height _0  (1.753 m), weight 54.4 kg (120 lb), SpO2 98 %. He is awake and alert.  Chest is clear.  He has some chest wall tenderness.  Disposition: Discharge.  Case management is working on getting  him to a homeless shelter      Signed: Tavaris Eudy L   06/24/2017, 9:02 AM

## 2017-06-24 NOTE — Progress Notes (Addendum)
He says he does not feel well.  This is mostly his chronic aching pain.  He also says he does not feel well because his blood sugar was up this morning but he refused his Lantus last night.  He does not need any further inpatient hospital care at this point so I am going to plan to discharge him.  He will receive Lantus this morning if he will take it.  He is going to go to a homeless shelter.

## 2017-08-23 ENCOUNTER — Ambulatory Visit: Payer: Medicaid Other | Admitting: "Endocrinology

## 2017-09-29 ENCOUNTER — Ambulatory Visit: Payer: Medicaid Other | Admitting: "Endocrinology

## 2017-10-16 ENCOUNTER — Other Ambulatory Visit: Payer: Self-pay

## 2017-10-16 ENCOUNTER — Encounter (HOSPITAL_COMMUNITY): Payer: Self-pay | Admitting: Emergency Medicine

## 2017-10-16 ENCOUNTER — Emergency Department (HOSPITAL_COMMUNITY): Payer: Medicaid Other

## 2017-10-16 ENCOUNTER — Inpatient Hospital Stay (HOSPITAL_COMMUNITY)
Admission: EM | Admit: 2017-10-16 | Discharge: 2017-10-21 | DRG: 871 | Disposition: A | Discharge status: Home / Self Care | Payer: Medicaid Other | Attending: Family Medicine | Admitting: Family Medicine

## 2017-10-16 DIAGNOSIS — E1151 Type 2 diabetes mellitus with diabetic peripheral angiopathy without gangrene: Secondary | ICD-10-CM | POA: Diagnosis present

## 2017-10-16 DIAGNOSIS — R52 Pain, unspecified: Secondary | ICD-10-CM | POA: Diagnosis present

## 2017-10-16 DIAGNOSIS — E860 Dehydration: Secondary | ICD-10-CM | POA: Diagnosis present

## 2017-10-16 DIAGNOSIS — Z7982 Long term (current) use of aspirin: Secondary | ICD-10-CM

## 2017-10-16 DIAGNOSIS — A419 Sepsis, unspecified organism: Principal | ICD-10-CM | POA: Diagnosis present

## 2017-10-16 DIAGNOSIS — Z794 Long term (current) use of insulin: Secondary | ICD-10-CM | POA: Diagnosis not present

## 2017-10-16 DIAGNOSIS — F913 Oppositional defiant disorder: Secondary | ICD-10-CM | POA: Diagnosis present

## 2017-10-16 DIAGNOSIS — Z681 Body mass index (BMI) 19 or less, adult: Secondary | ICD-10-CM | POA: Diagnosis not present

## 2017-10-16 DIAGNOSIS — R64 Cachexia: Secondary | ICD-10-CM | POA: Diagnosis present

## 2017-10-16 DIAGNOSIS — G8929 Other chronic pain: Secondary | ICD-10-CM | POA: Diagnosis present

## 2017-10-16 DIAGNOSIS — F17210 Nicotine dependence, cigarettes, uncomplicated: Secondary | ICD-10-CM | POA: Diagnosis present

## 2017-10-16 DIAGNOSIS — Z79899 Other long term (current) drug therapy: Secondary | ICD-10-CM

## 2017-10-16 DIAGNOSIS — E1140 Type 2 diabetes mellitus with diabetic neuropathy, unspecified: Secondary | ICD-10-CM | POA: Diagnosis present

## 2017-10-16 DIAGNOSIS — Z833 Family history of diabetes mellitus: Secondary | ICD-10-CM | POA: Diagnosis not present

## 2017-10-16 DIAGNOSIS — E1310 Other specified diabetes mellitus with ketoacidosis without coma: Secondary | ICD-10-CM | POA: Diagnosis not present

## 2017-10-16 DIAGNOSIS — E875 Hyperkalemia: Secondary | ICD-10-CM | POA: Diagnosis present

## 2017-10-16 DIAGNOSIS — E440 Moderate protein-calorie malnutrition: Secondary | ICD-10-CM

## 2017-10-16 DIAGNOSIS — E872 Acidosis, unspecified: Secondary | ICD-10-CM | POA: Diagnosis present

## 2017-10-16 DIAGNOSIS — R1310 Dysphagia, unspecified: Secondary | ICD-10-CM

## 2017-10-16 DIAGNOSIS — F1410 Cocaine abuse, uncomplicated: Secondary | ICD-10-CM | POA: Diagnosis present

## 2017-10-16 DIAGNOSIS — M1990 Unspecified osteoarthritis, unspecified site: Secondary | ICD-10-CM | POA: Diagnosis present

## 2017-10-16 DIAGNOSIS — F310 Bipolar disorder, current episode hypomanic: Secondary | ICD-10-CM | POA: Diagnosis present

## 2017-10-16 DIAGNOSIS — N179 Acute kidney failure, unspecified: Secondary | ICD-10-CM | POA: Diagnosis present

## 2017-10-16 DIAGNOSIS — R6521 Severe sepsis with septic shock: Secondary | ICD-10-CM | POA: Diagnosis present

## 2017-10-16 DIAGNOSIS — Z888 Allergy status to other drugs, medicaments and biological substances status: Secondary | ICD-10-CM

## 2017-10-16 DIAGNOSIS — K209 Esophagitis, unspecified without bleeding: Secondary | ICD-10-CM | POA: Diagnosis present

## 2017-10-16 DIAGNOSIS — E1110 Type 2 diabetes mellitus with ketoacidosis without coma: Secondary | ICD-10-CM | POA: Diagnosis present

## 2017-10-16 DIAGNOSIS — K210 Gastro-esophageal reflux disease with esophagitis: Secondary | ICD-10-CM | POA: Diagnosis present

## 2017-10-16 DIAGNOSIS — E119 Type 2 diabetes mellitus without complications: Secondary | ICD-10-CM

## 2017-10-16 LAB — CBC WITH DIFFERENTIAL/PLATELET
BASOS PCT: 0 %
Basophils Absolute: 0 10*3/uL (ref 0.0–0.1)
EOS PCT: 0 %
Eosinophils Absolute: 0 10*3/uL (ref 0.0–0.7)
HCT: 43.5 % (ref 39.0–52.0)
HEMOGLOBIN: 13.3 g/dL (ref 13.0–17.0)
LYMPHS ABS: 0.6 10*3/uL — AB (ref 0.7–4.0)
Lymphocytes Relative: 4 %
MCH: 32.4 pg (ref 26.0–34.0)
MCHC: 30.6 g/dL (ref 30.0–36.0)
MCV: 105.8 fL — ABNORMAL HIGH (ref 78.0–100.0)
MONO ABS: 1.6 10*3/uL — AB (ref 0.1–1.0)
MONOS PCT: 10 %
Neutro Abs: 14 10*3/uL — ABNORMAL HIGH (ref 1.7–7.7)
Neutrophils Relative %: 86 %
Platelets: 287 10*3/uL (ref 150–400)
RBC: 4.11 MIL/uL — ABNORMAL LOW (ref 4.22–5.81)
RDW: 14.2 % (ref 11.5–15.5)
WBC: 16.2 10*3/uL — ABNORMAL HIGH (ref 4.0–10.5)

## 2017-10-16 LAB — URINALYSIS, ROUTINE W REFLEX MICROSCOPIC
BILIRUBIN URINE: NEGATIVE
Bacteria, UA: NONE SEEN
Glucose, UA: >=500 mg/dL — AB
KETONES UR: 80 mg/dL — AB
LEUKOCYTES UA: NEGATIVE
NITRITE: NEGATIVE
PROTEIN: 30 mg/dL — AB
Specific Gravity, Urine: 1.018 (ref 1.005–1.030)
pH: 5 (ref 5.0–8.0)

## 2017-10-16 LAB — COMPREHENSIVE METABOLIC PANEL
ALT: 26 U/L (ref 0–44)
AST: 23 U/L (ref 15–41)
Albumin: 3.4 g/dL — ABNORMAL LOW (ref 3.5–5.0)
Alkaline Phosphatase: 68 U/L (ref 38–126)
BUN: 43 mg/dL — AB (ref 6–20)
CALCIUM: 8.4 mg/dL — AB (ref 8.9–10.3)
CO2: <7 mmol/L — ABNORMAL LOW (ref 22–32)
Chloride: 92 mmol/L — ABNORMAL LOW (ref 98–111)
Creatinine, Ser: 3.11 mg/dL — ABNORMAL HIGH (ref 0.61–1.24)
GFR calc non Af Amer: 21 mL/min — ABNORMAL LOW (ref >60)
GFR, EST AFRICAN AMERICAN: 25 mL/min — AB (ref >60)
GLUCOSE: 1097 mg/dL — AB (ref 70–99)
Potassium: 5.6 mmol/L — ABNORMAL HIGH (ref 3.5–5.1)
SODIUM: 133 mmol/L — AB (ref 135–145)
TOTAL PROTEIN: 6.1 g/dL — AB (ref 6.5–8.1)
Total Bilirubin: 2.2 mg/dL — ABNORMAL HIGH (ref 0.3–1.2)

## 2017-10-16 LAB — BLOOD GAS, VENOUS
FIO2: 21
O2 Saturation: 92.7 %
PH VEN: 7.013 — AB (ref 7.250–7.430)
pO2, Ven: 88.9 mmHg — ABNORMAL HIGH (ref 32.0–45.0)

## 2017-10-16 LAB — CBG MONITORING, ED
Glucose-Capillary: >600 mg/dL (ref 70–99)
Glucose-Capillary: >600 mg/dL (ref 70–99)

## 2017-10-16 LAB — BASIC METABOLIC PANEL
Anion gap: >20 — ABNORMAL HIGH (ref 5–15)
BUN: 40 mg/dL — AB (ref 6–20)
CHLORIDE: 104 mmol/L (ref 98–111)
CO2: 5 mmol/L — AB (ref 22–32)
Calcium: 7.3 mg/dL — ABNORMAL LOW (ref 8.9–10.3)
Creatinine, Ser: 2.7 mg/dL — ABNORMAL HIGH (ref 0.61–1.24)
GFR calc non Af Amer: 25 mL/min — ABNORMAL LOW (ref >60)
GFR, EST AFRICAN AMERICAN: 29 mL/min — AB (ref >60)
Glucose, Bld: 834 mg/dL (ref 70–99)
Potassium: 4.5 mmol/L (ref 3.5–5.1)
SODIUM: 137 mmol/L (ref 135–145)

## 2017-10-16 LAB — PROCALCITONIN: Procalcitonin: 1.61 ng/mL

## 2017-10-16 LAB — RAPID URINE DRUG SCREEN, HOSP PERFORMED
AMPHETAMINES: NOT DETECTED
Barbiturates: NOT DETECTED
Benzodiazepines: NOT DETECTED
Cocaine: POSITIVE — AB
Opiates: NOT DETECTED
Tetrahydrocannabinol: NOT DETECTED

## 2017-10-16 LAB — BLOOD GAS, ARTERIAL
FIO2: 28
O2 Saturation: 96.9 %
PO2 ART: 144 mmHg — AB (ref 83.0–108.0)
pH, Arterial: 6.998 — CL (ref 7.350–7.450)

## 2017-10-16 LAB — GLUCOSE, CAPILLARY
GLUCOSE-CAPILLARY: 451 mg/dL — AB (ref 70–99)
Glucose-Capillary: 227 mg/dL — ABNORMAL HIGH (ref 70–99)
Glucose-Capillary: 387 mg/dL — ABNORMAL HIGH (ref 70–99)

## 2017-10-16 LAB — MRSA PCR SCREENING: MRSA by PCR: NEGATIVE

## 2017-10-16 LAB — I-STAT CG4 LACTIC ACID, ED
Lactic Acid, Venous: 5.23 mmol/L (ref 0.5–1.9)
Lactic Acid, Venous: 3.66 mmol/L (ref 0.5–1.9)

## 2017-10-16 LAB — LACTIC ACID, PLASMA: Lactic Acid, Venous: 1.8 mmol/L (ref 0.5–1.9)

## 2017-10-16 LAB — LIPASE, BLOOD: Lipase: 49 U/L (ref 11–51)

## 2017-10-16 LAB — TROPONIN I: Troponin I: <0.03 ng/mL (ref <0.03)

## 2017-10-16 MED ORDER — PANTOPRAZOLE SODIUM 40 MG PO TBEC
40.0000 mg | DELAYED_RELEASE_TABLET | Freq: Two times a day (BID) | ORAL | Status: DC
  Administered 2017-10-16 – 2017-10-19 (×6): 40 mg via ORAL
  Filled 2017-10-16 (×6): qty 1

## 2017-10-16 MED ORDER — DEXTROSE-NACL 5-0.45 % IV SOLN
INTRAVENOUS | Status: DC
  Administered 2017-10-16: via INTRAVENOUS

## 2017-10-16 MED ORDER — SODIUM CHLORIDE 0.9 % IV SOLN
INTRAVENOUS | Status: DC
  Administered 2017-10-16: 5.4 [IU]/h via INTRAVENOUS
  Filled 2017-10-16: qty 1

## 2017-10-16 MED ORDER — PANTOPRAZOLE SODIUM 40 MG PO TBEC: 40.0000 mg | DELAYED_RELEASE_TABLET | Freq: Every day | ORAL | Status: DC

## 2017-10-16 MED ORDER — SODIUM CHLORIDE 0.9 % IV SOLN
INTRAVENOUS | Status: AC
  Filled 2017-10-16: qty 1

## 2017-10-16 MED ORDER — ONDANSETRON HCL 4 MG/2ML IJ SOLN
4.0000 mg | Freq: Once | INTRAMUSCULAR | Status: AC
  Administered 2017-10-16: 4 mg via INTRAVENOUS
  Filled 2017-10-16: qty 2

## 2017-10-16 MED ORDER — FAMOTIDINE IN NACL 20-0.9 MG/50ML-% IV SOLN: 20.0000 mg | Freq: Two times a day (BID) | INTRAVENOUS | Status: DC

## 2017-10-16 MED ORDER — VANCOMYCIN HCL 500 MG IV SOLR
500.0000 mg | INTRAVENOUS | Status: DC
  Filled 2017-10-16: qty 500

## 2017-10-16 MED ORDER — FAMOTIDINE IN NACL 20-0.9 MG/50ML-% IV SOLN
20.0000 mg | INTRAVENOUS | Status: DC
  Administered 2017-10-16 – 2017-10-19 (×4): 20 mg via INTRAVENOUS
  Filled 2017-10-16 (×4): qty 50

## 2017-10-16 MED ORDER — QUETIAPINE FUMARATE 100 MG PO TABS
200.0000 mg | ORAL_TABLET | Freq: Every day | ORAL | Status: DC
  Filled 2017-10-16 (×3): qty 2

## 2017-10-16 MED ORDER — SODIUM CHLORIDE 0.9 % IV BOLUS
1000.0000 mL | Freq: Once | INTRAVENOUS | Status: AC
  Administered 2017-10-16: 1000 mL via INTRAVENOUS

## 2017-10-16 MED ORDER — ENOXAPARIN SODIUM 40 MG/0.4ML ~~LOC~~ SOLN
40.0000 mg | SUBCUTANEOUS | Status: DC
  Filled 2017-10-16 (×2): qty 0.4

## 2017-10-16 MED ORDER — GABAPENTIN 300 MG PO CAPS
300.0000 mg | ORAL_CAPSULE | Freq: Three times a day (TID) | ORAL | Status: DC
  Administered 2017-10-16 – 2017-10-21 (×14): 300 mg via ORAL
  Filled 2017-10-16 (×14): qty 1

## 2017-10-16 MED ORDER — FLUOXETINE HCL 20 MG PO CAPS
20.0000 mg | ORAL_CAPSULE | Freq: Every day | ORAL | Status: DC
  Administered 2017-10-18 – 2017-10-20 (×2): 20 mg via ORAL
  Filled 2017-10-16: qty 2
  Filled 2017-10-16 (×6): qty 1

## 2017-10-16 MED ORDER — SODIUM CHLORIDE 0.9 % IV SOLN
INTRAVENOUS | Status: AC
  Administered 2017-10-16: 21:00:00 via INTRAVENOUS

## 2017-10-16 MED ORDER — SODIUM CHLORIDE 0.9 % IV SOLN
INTRAVENOUS | Status: DC
  Administered 2017-10-16: 10.8 [IU]/h via INTRAVENOUS
  Administered 2017-10-17: 4.7 [IU]/h via INTRAVENOUS
  Filled 2017-10-16 (×2): qty 1

## 2017-10-16 MED ORDER — POTASSIUM CHLORIDE 10 MEQ/100ML IV SOLN
10.0000 meq | INTRAVENOUS | Status: AC
  Administered 2017-10-16 – 2017-10-17 (×4): 10 meq via INTRAVENOUS
  Filled 2017-10-16 (×4): qty 100

## 2017-10-16 MED ORDER — ORAL CARE MOUTH RINSE
15.0000 mL | Freq: Two times a day (BID) | OROMUCOSAL | Status: DC
  Administered 2017-10-18 – 2017-10-21 (×4): 15 mL via OROMUCOSAL

## 2017-10-16 MED ORDER — MORPHINE SULFATE (PF) 2 MG/ML IV SOLN
2.0000 mg | Freq: Once | INTRAVENOUS | Status: AC
  Administered 2017-10-16: 2 mg via INTRAVENOUS
  Filled 2017-10-16: qty 1

## 2017-10-16 MED ORDER — ASPIRIN 81 MG PO CHEW
81.0000 mg | CHEWABLE_TABLET | Freq: Every day | ORAL | Status: DC
  Administered 2017-10-16 – 2017-10-21 (×6): 81 mg via ORAL
  Filled 2017-10-16 (×6): qty 1

## 2017-10-16 MED ORDER — VANCOMYCIN HCL IN DEXTROSE 1-5 GM/200ML-% IV SOLN
1000.0000 mg | Freq: Once | INTRAVENOUS | Status: AC
  Administered 2017-10-16: 1000 mg via INTRAVENOUS
  Filled 2017-10-16: qty 200

## 2017-10-16 MED ORDER — SODIUM CHLORIDE 0.9 % IV BOLUS (SEPSIS)
1000.0000 mL | Freq: Once | INTRAVENOUS | Status: AC
  Administered 2017-10-16: 1000 mL via INTRAVENOUS

## 2017-10-16 MED ORDER — SODIUM CHLORIDE 0.9 % IV BOLUS (SEPSIS)
500.0000 mL | Freq: Once | INTRAVENOUS | Status: AC
  Administered 2017-10-16: 500 mL via INTRAVENOUS

## 2017-10-16 MED ORDER — PIPERACILLIN-TAZOBACTAM 3.375 G IVPB
3.3750 g | Freq: Two times a day (BID) | INTRAVENOUS | Status: DC
  Administered 2017-10-17: 3.375 g via INTRAVENOUS
  Filled 2017-10-16: qty 50

## 2017-10-16 MED ORDER — SODIUM CHLORIDE 0.9 % IV BOLUS
1000.0000 mL | Freq: Once | INTRAVENOUS | Status: AC
Start: 2017-10-16 — End: 2017-10-16
  Administered 2017-10-16: 1000 mL via INTRAVENOUS

## 2017-10-16 MED ORDER — PIPERACILLIN-TAZOBACTAM 3.375 G IVPB 30 MIN
3.3750 g | Freq: Once | INTRAVENOUS | Status: AC
  Administered 2017-10-16: 3.375 g via INTRAVENOUS
  Filled 2017-10-16: qty 50

## 2017-10-16 MED ORDER — SODIUM CHLORIDE 0.9 % IV SOLN
INTRAVENOUS | Status: DC
  Administered 2017-10-16: 22:00:00 via INTRAVENOUS

## 2017-10-16 NOTE — ED Notes (Signed)
Respiratory paged for [facility]. 

## 2017-10-16 NOTE — H&P (Addendum)
History and Physical  LEX LINHARES CXK:[id number] DOB: 02-Apr-1965 DOA: 10/16/2017  Referring physician: Dr Marcie Bal, ED physician PCP: [name], MD  Outpatient Specialists:   Patient Coming From: Home  Chief Complaint: Hyperglycemia   HPI: Kenneth [name] is a 52 y.o. male with a history of type 2 diabetes with noncompliance, chronic generalized pain, thyroid disease.  Patient presents to the hospital with 4 days of nausea and vomiting, hyperglycemia with diffuse chest and abdominal pains.  Denies fevers, chills, diarrhea.  He has been very thirsty but unable to tolerate liquids due to vomiting.  His hyperglycemia repeat preceded his nausea and vomiting, and has been worsening despite taking his insulin as prescribed.  He is not forthcoming with information, stating that his second does not want to talk.  He denies fevers, chills, diarrhea, bleeding.  He denies using alcohol or drugs.  Emergency Department Course: Patient has been hypotensive since arrival in the ER.  Temperature is approximate 4.  White count is 16.2 lactic acid initially 5.23 which is improved to 3.66.  Glucose is 1097 on his BMP.  ABG shows a pH of 6.99 with a PCO2 that is on reportable and a bicarb that is on reportable.  His metabolic panel, his bicarb is less than 7.  Insulin drip started  Review of Systems:   Pt denies any fevers, chills, diarrhea, constipation, abdominal pain, shortness of breath, dyspnea on exertion, orthopnea, cough, wheezing, palpitations, headache, vision changes, lightheadedness, dizziness, melena, rectal bleeding.  Review of systems are otherwise negative  Past Medical History:  Diagnosis Date  . Arthritis   . Chronic generalized pain   . Diabetes mellitus   . DKA (diabetic ketoacidosis) ([place]) 07/2014  . Malnutrition ([place])   . Neuropathy   . Noncompliance with medication regimen   . Thyroid disease    History reviewed. No pertinent surgical history. Social History:  reports that he  has been smoking.  He has a 4.00 pack-year smoking history. He has never used smokeless tobacco. He reports that he does not drink alcohol or use drugs. Patient lives at home  Allergies  Allergen Reactions  . Metformin And Related Other (See Comments)    Abdominal cramps and diarrhea  . Nsaids Nausea Only    Family History  Problem Relation Age of Onset  . Diabetes Mother   . Seizures Sister   . Diabetes Sister       Prior to Admission medications   Medication Sig Start Date End Date Taking? Authorizing Provider  insulin aspart (NOVOLOG) 100 UNIT/ML injection Inject 12 Units into the skin 3 (three) times daily with meals. 06/24/17  Yes Sinda Du, MD  aspirin 81 MG chewable tablet Chew 1 tablet (81 mg total) by mouth daily. 06/25/17   Sinda Du, MD  Blood Glucose Monitoring Suppl (TRUE METRIX AIR GLUCOSE METER) w/Device KIT 1 each by Does not apply route 4 (four) times daily -  before meals and at bedtime. 03/25/16   Dorena Dew, FNP  FLUoxetine (PROZAC) 20 MG tablet Take 1 tablet (20 mg total) by mouth daily. 06/24/17   Sinda Du, MD  gabapentin (NEURONTIN) 300 MG capsule Take 1 capsule by mouth 3 (three) times daily. 03/25/16   [provider]  glucose blood (TRUE METRIX BLOOD GLUCOSE TEST) test strip Use as instructed 03/25/16   Dorena Dew, FNP  insulin glargine (LANTUS) 100 UNIT/ML injection Inject 0.23 mLs (23 Units total) into the skin daily. 06/24/17   Sinda Du, MD  Insulin Pen Needle (BD ULTRA-FINE PEN NEEDLES) 29G X 12.7MM MISC 1 each by Does not apply route 4 (four) times daily. 04/26/16   Dorena Dew, FNP  lisinopril (ZESTRIL) 10 MG tablet Take 1 tablet (10 mg total) by mouth daily. 06/25/16   [name], MD  pantoprazole (PROTONIX) 40 MG tablet Take 1 tablet (40 mg total) by mouth daily. 06/24/17   Sinda Du, MD  QUEtiapine (SEROQUEL) 200 MG tablet Take 1 tablet (200 mg total) by mouth at bedtime. 06/24/17   Sinda Du, MD     Physical Exam: BP (!) 81/47   Pulse 96   Temp (!) 94.3 F (34.6 C)   Resp (!) 22   Ht '5\' 9"'$  (1.753 m)   Wt 49.9 kg (110 lb)   SpO2 100%   BMI 16.24 kg/m   . General: Middle-age frail. Awake and alert and oriented x3. No acute cardiopulmonary distress.  Marland Kitchen HEENT: Normocephalic atraumatic.  Right and left ears normal in appearance.  Pupils equal, round, reactive to light. Extraocular muscles are intact. Sclerae anicteric and noninjected.  Moist mucosal membranes. No mucosal lesions.  . Neck: Neck supple without lymphadenopathy. No carotid bruits. No masses palpated.  . Cardiovascular: Regular rate with normal S1-S2 sounds. No murmurs, rubs, gallops auscultated. No JVD.  Marland Kitchen Respiratory: Good respiratory effort with no wheezes, rales, rhonchi. Lungs clear to auscultation bilaterally.  No accessory muscle use. . Abdomen: Soft, nontender, nondistended. Active bowel sounds. No masses or hepatosplenomegaly  . Skin: No rashes, lesions, or ulcerations.  Dry, warm to touch. 2+ dorsalis pedis and radial pulses. . Musculoskeletal: No calf or leg pain. All major joints not erythematous nontender.  No upper or lower joint deformation.  Good ROM.  No contractures  . Psychiatric: Intact judgment and insight. Pleasant and cooperative. . Neurologic: No focal neurological deficits. Strength is 5/5 and symmetric in upper and lower extremities.  Cranial nerves II through XII are grossly intact.           Labs on Admission: I have personally reviewed following labs and imaging studies  CBC: Recent Labs  Lab 10/16/17 1614  WBC 16.2*  NEUTROABS 14.0*  HGB 13.3  HCT 43.5  MCV 105.8*  PLT 962   Basic Metabolic Panel: Recent Labs  Lab 10/16/17 1614  NA 133*  K 5.6*  CL 92*  CO2 <7*  GLUCOSE 1,097*  BUN 43*  CREATININE 3.11*  CALCIUM 8.4*   GFR: Estimated Creatinine Clearance: 19.2 mL/min (A) (by C-G formula based on SCr of 3.11 mg/dL (H)). Liver Function Tests: Recent Labs  Lab  10/16/17 1614  AST 23  ALT 26  ALKPHOS 68  BILITOT 2.2*  PROT 6.1*  ALBUMIN 3.4*   Recent Labs  Lab 10/16/17 1614  LIPASE 49   No results for input(s): AMMONIA in the last 168 hours. Coagulation Profile: No results for input(s): INR, PROTIME in the last 168 hours. Cardiac Enzymes: Recent Labs  Lab 10/16/17 1614  TROPONINI <0.03   BNP (last 3 results) No results for input(s): PROBNP in the last 8760 hours. HbA1C: No results for input(s): HGBA1C in the last 72 hours. CBG: Recent Labs  Lab 10/16/17 1610 10/16/17 1747  GLUCAP >600* >600*   Lipid Profile: No results for input(s): CHOL, HDL, LDLCALC, TRIG, CHOLHDL, LDLDIRECT in the last 72 hours. Thyroid Function Tests: No results for input(s): TSH, T4TOTAL, FREET4, T3FREE, THYROIDAB in the last 72 hours. Anemia Panel: No results for input(s): VITAMINB12, FOLATE, FERRITIN, TIBC, IRON, RETICCTPCT  in the last 72 hours. Urine analysis:    Component Value Date/Time   COLORURINE COLORLESS (A) 06/16/2017 0217   APPEARANCEUR CLEAR 06/16/2017 0217   LABSPEC 1.028 06/16/2017 0217   PHURINE 6.0 06/16/2017 0217   GLUCOSEU >=500 (A) 06/16/2017 0217   HGBUR NEGATIVE 06/16/2017 0217   BILIRUBINUR NEGATIVE 06/16/2017 0217   KETONESUR 5 (A) 06/16/2017 0217   PROTEINUR NEGATIVE 06/16/2017 0217   UROBILINOGEN 0.2 01/18/2015 2300   NITRITE NEGATIVE 06/16/2017 0217   LEUKOCYTESUR NEGATIVE 06/16/2017 0217   Sepsis Labs: '@LABRCNTIP'$ (procalcitonin:4,lacticidven:4) ) Recent Results (from the past 240 hour(s))  Blood culture (routine x 2)     Status: None (Preliminary result)   Collection Time: 10/16/17  4:52 PM  Result Value Ref Range Status   Specimen Description BLOOD LEFT ARM BOTTLES DRAWN AEROBIC AND ANAEROBIC  Final   Special Requests   Final    Blood Culture adequate volume Performed at [facility], [address] [place], [place] [address]    Culture PENDING  Incomplete   Report Status PENDING  Incomplete  Blood culture  (routine x 2)     Status: None (Preliminary result)   Collection Time: 10/16/17  4:54 PM  Result Value Ref Range Status   Specimen Description   Final    BLOOD RIGHT HAND BOTTLES DRAWN AEROBIC AND ANAEROBIC   Special Requests   Final    Blood Culture adequate volume Performed at [facility], [address] [place], [place] [address]    Culture PENDING  Incomplete   Report Status PENDING  Incomplete     Radiological Exams on Admission: Dg Chest Port 1 View  Result Date: 10/16/2017 CLINICAL DATA:  Acute chest pain. EXAM: PORTABLE CHEST 1 VIEW COMPARISON:  06/16/2017 FINDINGS: The cardiomediastinal silhouette is unremarkable. There is no evidence of focal airspace disease, pulmonary edema, suspicious pulmonary nodule/mass, pleural effusion, or pneumothorax. No acute bony abnormalities are identified. IMPRESSION: No active disease. Electronically Signed   By: Margarette Canada M.D.   On: 10/16/2017 17:02    EKG: Independently reviewed.  Sinus rhythm.  Nonspecific ST changes  Assessment/Plan: Principal Problem:   Severe sepsis with septic shock (CODE) (HCC) Active Problems:   DKA (diabetic ketoacidoses) (HCC)   Hyperkalemia   Diabetes mellitus ([place])   Cocaine abuse ([place])   Lactic acidosis   Acute renal injury ([place])   Esophagitis    This patient was discussed with the ED physician, including pertinent vitals, physical exam findings, labs, and imaging.  We also discussed care given by the ED provider.  1. Severe sepsis with septic shock a. Admit to ICU b. As patient has hypotension persistent with food boluses, AKI, elevated lactic acid level, patient meets criteria for sepsis with septic shock. c. Hypotension improving. Patient's SBP at baseline 90-100 d. Unknown source at this time i. Will follow CT chest, abd/pelvis e. Blood cultures obtained f. Broad-spectrum antibiotics started g. Repeat lactic acid in 3 hours h. Will obtain procalcitonin i. CBC tomorrow  morning 2. DKA Telemetry monitoring Continue aggressive IV hydration CBGs every hour Basic metabolic panel every 4 hours Potassium replacement: None needed at this point Noncaloric liquids Transition to D5 normal saline at 150 mL's per hour with CBG at 250 Transition to home insulin regimen when gap is closed and acidosis resolved. 3. Acute renal injury a. Repeat creatinine every 4 hours to ensure adequate hydration 4. Lactic acidosis a. Likely secondary to DKA and sepsis b. We will trend for resolution 5. Hyperkalemia a. We will  monitor closely 6. Diabetes 7. Cocaine use 8. Esophagitis a. Pepcid b. Protonix  DVT prophylaxis: Lovenox Consultants: None Code Status: Full code Family Communication: None Disposition Plan: Patient should be able to return home following admission    [facility] Pager [phone]  If 7PM-7AM, please contact night-coverage [email or web address] Password [email or web address]

## 2017-10-16 NOTE — ED Notes (Signed)
Date and time results received: 10/16/17 4:37 PM   Test: I-stat Lactic Critical Value: 5.23  Name of Provider Notified: Ray  Orders Received? Or Actions Taken?: no new orders at this time.

## 2017-10-16 NOTE — Progress Notes (Signed)
Dr. Adrian BlackwaterStinson paged to verify Potassium orders- note stated no K replacement but K decreased from 5.6 to 4.5.  Dr. Adrian BlackwaterStinson verified to hang K runs d/t decrease in K.   This RN also made Dr. Adrian BlackwaterStinson aware of Critical value glucose of 8.4.  No new orders d/t pt being on insulin gtt and last glucose being >1000. Will continue to monitor pt

## 2017-10-16 NOTE — ED Notes (Signed)
Date and time results received: 10/16/17 4:43 PM  Test: VBG Critical Value:  PH 7.01 Po2 92.1 PCo2 < rep range Bicarb: would not register Sat 88.9  Name of Provider Notified: Ray  Orders Received? Or Actions Taken?: No new orders at this time.

## 2017-10-16 NOTE — ED Notes (Signed)
Date and time results received: 10/16/17 6:44 PM   Test: I-stat Lactic Critical Value: 3.66  Name of Provider Notified: Ray  Orders Received? Or Actions Taken?:no new orders at this time.

## 2017-10-16 NOTE — ED Notes (Signed)
AC paged for insulin drip 

## 2017-10-16 NOTE — ED Notes (Signed)
Report given to Allison.RN

## 2017-10-16 NOTE — ED Notes (Signed)
Date and time results received: 10/16/17 5:31 PM  Test: ABG Critical Value:  PH 6.99 PCo2 < reportable range PO2 144 Sat 96.9 Bicarb < reportable range  Name of Provider Notified: Ray   Orders Received? Or Actions Taken?: no new orders at this time

## 2017-10-16 NOTE — ED Provider Notes (Signed)
[facility] EMERGENCY DEPARTMENT Provider Note   CSN: [id number] Arrival date & time: 10/16/17  1602     History   Chief Complaint Chief Complaint  Patient presents with  . Hyperglycemia    HPI Kenneth [name] is a 52 y.o. male.  HPI  52 year old male history of type 2 diabetes, DKA, noncompliance with medications presents today complaining of 4 days of nausea, vomiting, hyperglycemia, diffuse chest and abdominal pain.  Patient states he vomits multiple times per day and is unable to keep down any fluids.  Denies alcohol use.  He denies fever, chills, diarrhea, or rectal bleeding.  He states he is very thirsty has been unable to drink fluids.  Patient reports hyperglycemia prior to symptoms of nausea and vomiting.  He reports taking his insulin as prescribed.  Historically, he has been noncompliant with his meds. He states that he is sick and does not want to answer questions, and was difficult to get a complete history from patient  Past Medical History:  Diagnosis Date  . Arthritis   . Chronic generalized pain   . Diabetes mellitus   . DKA (diabetic ketoacidosis) ([place]) 07/2014  . Malnutrition ([place])   . Neuropathy   . Noncompliance with medication regimen   . Thyroid disease     Patient Active Problem List   Diagnosis Date Noted  . Hypotension 06/16/2017  . Cocaine abuse ([place])   . Homeless   . Lactic acidosis   . DKA, type 2 ([place]) 06/22/2016  . Non compliance w medication regimen   . Protein-calorie malnutrition, severe 06/11/2016  . Chronic generalized pain 04/26/2016  . Neuropathy 04/26/2016  . Diabetes mellitus type 2, uncontrolled, with complications ([place])   . 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 Depression, recurrent ([place])   . Abdominal pain   . Hyperglycemia 02/24/2016  . Volume depletion 12/21/2015  . Metabolic acidosis 03/30/3233  . Diabetes mellitus with nonketotic hyperosmolarity ([place]) 09/21/2014  .  Noncompliance with medication regimen   . Diabetes mellitus ([place])   . DKA (diabetic ketoacidoses) ([place]) 07/24/2014  . Dehydration 07/24/2014  . Acute renal failure ([place]) 07/24/2014  . Hyperkalemia 07/24/2014  . Hyponatremia 07/24/2014  . Hyperthyroidism 07/24/2014    History reviewed. No pertinent surgical history.      Home Medications    Prior to Admission medications   Medication Sig Start Date End Date Taking? Authorizing Provider  aspirin 81 MG chewable tablet Chew 1 tablet (81 mg total) by mouth daily. 06/25/17   Sinda Du, MD  Blood Glucose Monitoring Suppl (TRUE METRIX AIR GLUCOSE METER) w/Device KIT 1 each by Does not apply route 4 (four) times daily -  before meals and at bedtime. 03/25/16   Dorena Dew, FNP  FLUoxetine (PROZAC) 20 MG tablet Take 1 tablet (20 mg total) by mouth daily. 06/24/17   Sinda Du, MD  gabapentin (NEURONTIN) 300 MG capsule Take 1 capsule by mouth 3 (three) times daily. 03/25/16   [provider]  glucose blood (TRUE METRIX BLOOD GLUCOSE TEST) test strip Use as instructed 03/25/16   Dorena Dew, FNP  insulin aspart (NOVOLOG) 100 UNIT/ML injection Inject 12 Units into the skin 3 (three) times daily with meals. 06/24/17   Sinda Du, MD  insulin glargine (LANTUS) 100 UNIT/ML injection Inject 0.23 mLs (23 Units total) into the skin daily. 06/24/17   Sinda Du, MD  Insulin Pen Needle (BD ULTRA-FINE PEN NEEDLES) 29G X 12.7MM MISC 1 each by  Does not apply route 4 (four) times daily. 04/26/16   Dorena Dew, FNP  lisinopril (ZESTRIL) 10 MG tablet Take 1 tablet (10 mg total) by mouth daily. 06/25/16   [name], MD  pantoprazole (PROTONIX) 40 MG tablet Take 1 tablet (40 mg total) by mouth daily. 06/24/17   Sinda Du, MD  QUEtiapine (SEROQUEL) 200 MG tablet Take 1 tablet (200 mg total) by mouth at bedtime. 06/24/17   Sinda Du, MD    Family History Family History  Problem Relation Age of Onset  . Diabetes  Mother   . Seizures Sister   . Diabetes Sister     Social History Social History   Tobacco Use  . Smoking status: Current Every Day Smoker    Packs/day: 0.50    Years: 8.00    Pack years: 4.00  . Smokeless tobacco: Never Used  Substance Use Topics  . Alcohol use: No  . Drug use: No     Allergies   Metformin and related and Nsaids   Review of Systems Review of Systems  All other systems reviewed and are negative.    Physical Exam Updated Vital Signs Ht 1.753 m ('5\' 9"'$ )   Wt 49.9 kg (110 lb)   BMI 16.24 kg/m   Physical Exam  Constitutional: He is oriented to person, place, and time.  Cachectic chronically ill-appearing male  HENT:  Head: Normocephalic and atraumatic.  Right Ear: External ear normal.  Left Ear: External ear normal.  Mucous membranes are dry  Eyes: Pupils are equal, round, and reactive to light. EOM are normal.  Neck: Normal range of motion. Neck supple.  Cardiovascular: Normal rate, regular rhythm and normal heart sounds.  Pulmonary/Chest: Effort normal and breath sounds normal.  Abdominal: Soft. Bowel sounds are normal. There is tenderness.  Musculoskeletal: Normal range of motion.  Neurological: He is alert and oriented to person, place, and time.  Skin: Skin is warm and dry. Capillary refill takes less than 2 seconds.  Psychiatric: He has a normal mood and affect.  Nursing note and vitals reviewed.    ED Treatments / Results  Labs (all labs ordered are listed, but only abnormal results are displayed) Labs Reviewed  CBG MONITORING, ED - Abnormal; Notable for the following components:      Result Value   Glucose-Capillary >600 (*)    All other components within normal limits  BLOOD GAS, VENOUS  CBC WITH DIFFERENTIAL/PLATELET  COMPREHENSIVE METABOLIC PANEL  LIPASE, BLOOD  TROPONIN I    EKG EKG Interpretation  Date/Time:  Sunday October 16 2017 16:07:15 EDT Ventricular Rate:  92 PR Interval:    QRS Duration: 102 QT  Interval:  376 QTC Calculation: 466 R Axis:   102 Text Interpretation:  Sinus rhythm Anterior infarct, old Non-specific ST-t changes Confirmed by Pattricia Boss [phone]) on 10/16/2017 4:19:29 PM   Radiology Dg Chest Port 1 View  Result Date: 10/16/2017 CLINICAL DATA:  Acute chest pain. EXAM: PORTABLE CHEST 1 VIEW COMPARISON:  06/16/2017 FINDINGS: The cardiomediastinal silhouette is unremarkable. There is no evidence of focal airspace disease, pulmonary edema, suspicious pulmonary nodule/mass, pleural effusion, or pneumothorax. No acute bony abnormalities are identified. IMPRESSION: No active disease. Electronically Signed   By: Margarette Canada M.D.   On: 10/16/2017 17:02   CXR reviewed Procedures ARTERIAL BLOOD GAS Date/Time: 10/16/2017 5:21 PM Performed by: Pattricia Boss, MD Authorized by: Pattricia Boss, MD   Consent:    Consent obtained:  Verbal   Consent given by:  Patient  Risks discussed:  Bleeding Anesthesia (see MAR for exact dosages):    Anesthesia method:  None Procedure details:    Location of arterial blood gas: left brachial.   Allen's test performed: no     Number of attempts:  2 Post-procedure details:    Dressing applied: yes     Bleeding:  Hemostasis achieved   Circulation, movement, and sensation:  Normal   Patient tolerance of procedure:  Tolerated well, no immediate complications Comments:     Ultrasound guidance due to hypotension  .Critical Care Performed by: Pattricia Boss, MD Authorized by: Pattricia Boss, MD   Critical care provider statement:    Critical care time (minutes):  120   Critical care start time:  10/16/2017 4:02 PM   Critical care end time:  10/16/2017 6:54 PM   Critical care was time spent personally by me on the following activities:  Discussions with consultants, evaluation of patient's response to treatment, examination of patient, ordering and performing treatments and interventions, ordering and review of laboratory studies, ordering and  review of radiographic studies, pulse oximetry, re-evaluation of patient's condition, obtaining history from patient or surrogate and review of old charts   (including critical care time)  Medications Ordered in ED Medications  sodium chloride 0.9 % bolus 1,000 mL (has no administration in time range)     Initial Impression / Assessment and Plan / ED Course  I have reviewed the triage vital signs and the nursing notes.  Pertinent labs & imaging results that were available during my care of the patient were reviewed by me and considered in my medical decision making (see chart for details).  Clinical Course as of Oct 16 1732  Sun Oct 16, 2017  1646 Elevated lactic acid noted- patient with multiple possible alternative diagnosis to sepsis to account for lactic acidemia, however, given hypotension, abdominal pain, nausea, vomiting, hypothermia, leukocytosis- will give broad spectrum antibiotics, 30 cc/kg bolus. Patient with hypothermia- warming measures intitiated.   Lactic Acid, Venous(!!): 5.23 [DR]  1723 Potassium(!): 5.6 [DR]  1723 Hyperglycemia noted-   Glucose(!!): 1,097 [DR]    Clinical Course User Index [DR] Pattricia Boss, MD   ABG obtained by MD with US guidance RT unable to obtain 6:04 PM Blood pressure 80/50 Patient awake and alert Has not made urine at this point.  He has had 2500 cc normal saline infused Tmp Foley being inserted Patient complaining of epigastric pain 2 mg morphine ordered Will CT chest and abdomen 1-DKA vs HHS- patient with severe hyperglycemia, pH 6.99, with very low bicarb (c.w. Dka) also with profound dehydration and ho HHS. Serum ketones and osmolality pending. REhydration in process with 2500 cc infused and 1 liter infusing Insulin drip in place 2-? Etiology- patient with nausea and vomiting and epigastric pain- plan ct but suspect secondary to hyperglycemia 3- new renal failure- creatinine 3 from baseline <1, virtually no uop after 2500 cc  continue iv fluids 4- hypotension- likely secondary to above, but cannot rule out other causes such as sepsis- patient with hypothermia, leukocytosis, elevated lactate. Will start norepi if map <60 after next liter ns 5-hyperkalemia- will trend with fluid shifts and insulin- no potassium added initially    Discussed with Dr. Nehemiah Settle and he will see for admission   Final Clinical Impressions(s) / ED Diagnoses   Final diagnoses:  Diabetic ketoacidosis without coma associated with other specified diabetes mellitus [facility])    ED Discharge Orders    None       Tiah Heckel,  Andee Poles, MD 10/16/17 9311

## 2017-10-16 NOTE — ED Notes (Signed)
MD Ray notified of pt's BP at this time, per MD give 1 L bolus wide open and update with new BP.

## 2017-10-16 NOTE — ED Notes (Signed)
Pt being verbally aggressive and rude to staff stating he refuses to answer anymore questions yet continues to talk over staff. Refuses rectal temp, unable to obtain oral temp. Security at bedside.

## 2017-10-16 NOTE — ED Notes (Signed)
CRITICAL VALUE ALERT  Critical Value:  Glucose 1097  Date & Time Notified:  10/16/17/ 1716  Provider Notified: Dr Rosalia Hammersray  Orders Received/Actions taken:

## 2017-10-16 NOTE — Progress Notes (Signed)
Pt refusing Seroquel and Lovenox tonight- states he only takes Seroquel sometimes and only when he wants to sleep good. Educated on lovenox and reasoning for getting it- to help prevent blood clots. Pt continues to refuse and states that he doesn't want it right now, he will take it later possible. Will continue to monitor pt

## 2017-10-16 NOTE — Progress Notes (Signed)
Patient Name: Kenneth [name], male   DOB: 04/19/1965, 52 y.o.  MRN: [id number]  BP (!) 88/47   Pulse 96   Temp 98.1 F (36.7 C) (Core (Comment))   Resp (!) 21   Ht 5\' 9"  (1.753 m)   Wt 52.2 kg (115 lb 1.3 oz)   SpO2 100%   BMI 16.99 kg/m   Repeat labs reviewed:   BMP Latest Ref Rng & Units 10/16/2017 10/16/2017 06/21/2017  Glucose 70 - 99 mg/dL 914(NW834(HH) 2,956(OZ1,097(HH) 308(M492(H)  BUN 6 - 20 mg/dL 57(Q40(H) 46(N43(H) -  Creatinine 0.61 - 1.24 mg/dL 6.29(B2.70(H) 2.84(X3.11(H) -  Sodium 135 - 145 mmol/L 137 133(L) -  Potassium 3.5 - 5.1 mmol/L 4.5 5.6(H) -  Chloride 98 - 111 mmol/L 104 92(L) -  CO2 22 - 32 mmol/L 5(L) <7(L) -  Calcium 8.9 - 10.3 mg/dL 7.3(L) 8.4(L) -   Lactic acid: 5.23 > 3.66 > 1.61 Procalcitonin: 1.61  Because potassium dropped to 4.5, will start IV potassium x4 runs.  Levie HeritageJacob J [name] Ginley, DO 10/16/2017 8:52 PM

## 2017-10-16 NOTE — ED Notes (Signed)
istat lactic 3.66. MD notified.

## 2017-10-16 NOTE — ED Triage Notes (Signed)
Pt here for hyperglycemia and hypotension.  Family called ems as pt has been lying around and has been vomiting.  Pt states he no longer has a pcp as he fired him.

## 2017-10-16 NOTE — Progress Notes (Addendum)
Pharmacy Antibiotic Note  Kenneth RainwaterRandy C [name] is a 52 y.o. male admitted on 10/16/2017 with sepsis.  Pharmacy has been consulted for Vancomycin and zosyn dosing.  Plan: Vancomycin 1000mg  IV loading dose in ED, then 500mg  IV every q48hrs hours.  Goal trough 15-20 mcg/mL. Zosyn 3.375g IV q12h (4 hour infusion). F/U cxs and clinical progress Monitor V/S, labs, and levels as indicated  Height: 5\' 9"  (175.3 cm) Weight: 110 lb (49.9 kg) IBW/kg (Calculated) : 70.7  Temp (24hrs), Avg:94.8 F (34.9 C), Min:93.6 F (34.2 C), Max:96.8 F (36 C)  Recent Labs  Lab 10/16/17 1614 10/16/17 1630 10/16/17 1825  WBC 16.2*  --   --   CREATININE 3.11*  --   --   LATICACIDVEN  --  5.23* 3.66*    Estimated Creatinine Clearance: 19.2 mL/min (A) (by C-G formula based on SCr of 3.11 mg/dL (H)).    Allergies  Allergen Reactions  . Metformin And Related Other (See Comments)    Abdominal cramps and diarrhea  . Nsaids Nausea Only    Antimicrobials this admission: Vancomycin 7/28 >>  Zosyn 7/28 >>   Dose adjustments this admission: 7/29 Zosyn 3.375g IV q8h (4 hour infusion).  Microbiology results: 7/28 BCx:   MRSA PCR:   Thank you for allowing pharmacy to be a part of this patient's care.  Elder CyphersLorie  Levent Kornegay, BS Loura Backharm D, [place] Clinical Pharmacist Pager [phone] 10/16/2017 7:39 PM   Addendum:   Renal function improved, adjustedZosyn 3.375g IV q8h (4 hour infusion).

## 2017-10-17 LAB — GLUCOSE, CAPILLARY
GLUCOSE-CAPILLARY: 124 mg/dL — AB (ref 70–99)
GLUCOSE-CAPILLARY: 193 mg/dL — AB (ref 70–99)
GLUCOSE-CAPILLARY: 202 mg/dL — AB (ref 70–99)
GLUCOSE-CAPILLARY: 89 mg/dL (ref 70–99)
Glucose-Capillary: 153 mg/dL — ABNORMAL HIGH (ref 70–99)
Glucose-Capillary: 140 mg/dL — ABNORMAL HIGH (ref 70–99)
Glucose-Capillary: 103 mg/dL — ABNORMAL HIGH (ref 70–99)
Glucose-Capillary: 52 mg/dL — ABNORMAL LOW (ref 70–99)
Glucose-Capillary: 53 mg/dL — ABNORMAL LOW (ref 70–99)
Glucose-Capillary: 54 mg/dL — ABNORMAL LOW (ref 70–99)
Glucose-Capillary: 128 mg/dL — ABNORMAL HIGH (ref 70–99)
Glucose-Capillary: 393 mg/dL — ABNORMAL HIGH (ref 70–99)

## 2017-10-17 LAB — BASIC METABOLIC PANEL
ANION GAP: 12 (ref 5–15)
ANION GAP: 5 (ref 5–15)
Anion gap: 6 (ref 5–15)
BUN: 20 mg/dL (ref 6–20)
BUN: 26 mg/dL — AB (ref 6–20)
BUN: 30 mg/dL — AB (ref 6–20)
CALCIUM: 7.9 mg/dL — AB (ref 8.9–10.3)
CALCIUM: 7.5 mg/dL — AB (ref 8.9–10.3)
CALCIUM: 7.6 mg/dL — AB (ref 8.9–10.3)
CO2: 16 mmol/L — ABNORMAL LOW (ref 22–32)
CO2: 22 mmol/L (ref 22–32)
CO2: 23 mmol/L (ref 22–32)
Chloride: 108 mmol/L (ref 98–111)
Chloride: 116 mmol/L — ABNORMAL HIGH (ref 98–111)
Chloride: 117 mmol/L — ABNORMAL HIGH (ref 98–111)
Creatinine, Ser: 0.89 mg/dL (ref 0.61–1.24)
Creatinine, Ser: 1.4 mg/dL — ABNORMAL HIGH (ref 0.61–1.24)
Creatinine, Ser: 1.94 mg/dL — ABNORMAL HIGH (ref 0.61–1.24)
GFR calc Af Amer: 43 mL/min — ABNORMAL LOW (ref >60)
GFR calc Af Amer: >60 mL/min (ref >60)
GFR, EST NON AFRICAN AMERICAN: 37 mL/min — AB (ref >60)
GFR, EST NON AFRICAN AMERICAN: 56 mL/min — AB (ref >60)
GLUCOSE: 136 mg/dL — AB (ref 70–99)
GLUCOSE: 249 mg/dL — AB (ref 70–99)
Glucose, Bld: 186 mg/dL — ABNORMAL HIGH (ref 70–99)
Potassium: 3.7 mmol/L (ref 3.5–5.1)
Potassium: 3.8 mmol/L (ref 3.5–5.1)
Potassium: 3.9 mmol/L (ref 3.5–5.1)
SODIUM: 144 mmol/L (ref 135–145)
Sodium: 144 mmol/L (ref 135–145)
Sodium: 137 mmol/L (ref 135–145)

## 2017-10-17 LAB — LACTIC ACID, PLASMA: LACTIC ACID, VENOUS: 2.2 mmol/L — AB (ref 0.5–1.9)

## 2017-10-17 LAB — CBC WITH DIFFERENTIAL/PLATELET
BASOS ABS: 0 10*3/uL (ref 0.0–0.1)
BASOS PCT: 0 %
Eosinophils Absolute: 0 10*3/uL (ref 0.0–0.7)
Eosinophils Relative: 0 %
HCT: 34.8 % — ABNORMAL LOW (ref 39.0–52.0)
HEMOGLOBIN: 12.1 g/dL — AB (ref 13.0–17.0)
LYMPHS ABS: 1.6 10*3/uL (ref 0.7–4.0)
Lymphocytes Relative: 14 %
MCH: 32.1 pg (ref 26.0–34.0)
MCHC: 34.8 g/dL (ref 30.0–36.0)
MCV: 92.3 fL (ref 78.0–100.0)
Monocytes Absolute: 1.2 10*3/uL — ABNORMAL HIGH (ref 0.1–1.0)
Monocytes Relative: 11 %
NEUTROS PCT: 75 %
Neutro Abs: 8.5 10*3/uL — ABNORMAL HIGH (ref 1.7–7.7)
Platelets: 211 10*3/uL (ref 150–400)
RBC: 3.77 MIL/uL — AB (ref 4.22–5.81)
RDW: 13.5 % (ref 11.5–15.5)
WBC: 11.2 10*3/uL — AB (ref 4.0–10.5)

## 2017-10-17 LAB — PROCALCITONIN: Procalcitonin: 6.33 ng/mL

## 2017-10-17 LAB — HEMOGLOBIN A1C
HEMOGLOBIN A1C: 11.2 % — AB (ref 4.8–5.6)
MEAN PLASMA GLUCOSE: 274.74 mg/dL

## 2017-10-17 LAB — OSMOLALITY: OSMOLALITY: 370 mosm/kg — AB (ref 275–295)

## 2017-10-17 LAB — MAGNESIUM: MAGNESIUM: 2 mg/dL (ref 1.7–2.4)

## 2017-10-17 MED ORDER — PIPERACILLIN-TAZOBACTAM 3.375 G IVPB
3.3750 g | Freq: Three times a day (TID) | INTRAVENOUS | Status: DC
  Administered 2017-10-17 – 2017-10-18 (×3): 3.375 g via INTRAVENOUS
  Filled 2017-10-17 (×4): qty 50

## 2017-10-17 MED ORDER — TRAMADOL HCL 50 MG PO TABS
50.0000 mg | ORAL_TABLET | Freq: Three times a day (TID) | ORAL | Status: DC | PRN
  Administered 2017-10-17 – 2017-10-21 (×10): 50 mg via ORAL
  Filled 2017-10-17 (×10): qty 1

## 2017-10-17 MED ORDER — INSULIN ASPART 100 UNIT/ML ~~LOC~~ SOLN
0.0000 [IU] | Freq: Every day | SUBCUTANEOUS | Status: DC
  Administered 2017-10-17: 5 [IU] via SUBCUTANEOUS
  Administered 2017-10-19: 3 [IU] via SUBCUTANEOUS
  Administered 2017-10-20: 4 [IU] via SUBCUTANEOUS

## 2017-10-17 MED ORDER — INSULIN GLARGINE 100 UNIT/ML ~~LOC~~ SOLN
10.0000 [IU] | Freq: Every day | SUBCUTANEOUS | Status: DC
  Administered 2017-10-17 – 2017-10-18 (×2): 10 [IU] via SUBCUTANEOUS
  Filled 2017-10-17 (×3): qty 0.1

## 2017-10-17 MED ORDER — INSULIN ASPART 100 UNIT/ML ~~LOC~~ SOLN
0.0000 [IU] | Freq: Three times a day (TID) | SUBCUTANEOUS | Status: DC
  Administered 2017-10-17: 2 [IU] via SUBCUTANEOUS
  Administered 2017-10-17: 1 [IU] via SUBCUTANEOUS
  Administered 2017-10-18: 5 [IU] via SUBCUTANEOUS
  Administered 2017-10-18: 2 [IU] via SUBCUTANEOUS
  Administered 2017-10-19: 5 [IU] via SUBCUTANEOUS
  Administered 2017-10-19: 2 [IU] via SUBCUTANEOUS
  Administered 2017-10-20: 7 [IU] via SUBCUTANEOUS
  Administered 2017-10-20: 5 [IU] via SUBCUTANEOUS
  Administered 2017-10-21: 7 [IU] via SUBCUTANEOUS
  Administered 2017-10-21: 9 [IU] via SUBCUTANEOUS

## 2017-10-17 MED ORDER — INSULIN GLARGINE 100 UNIT/ML ~~LOC~~ SOLN
18.0000 [IU] | Freq: Every day | SUBCUTANEOUS | Status: DC
  Administered 2017-10-17: 18 [IU] via SUBCUTANEOUS
  Filled 2017-10-17 (×2): qty 0.18

## 2017-10-17 MED ORDER — INSULIN GLARGINE 100 UNITS/ML SOLOSTAR PEN
18.0000 [IU] | PEN_INJECTOR | Freq: Every day | SUBCUTANEOUS | Status: DC
  Filled 2017-10-17: qty 3

## 2017-10-17 MED ORDER — SODIUM CHLORIDE 0.9 % IV SOLN
INTRAVENOUS | Status: DC
  Administered 2017-10-17 (×2): via INTRAVENOUS

## 2017-10-17 NOTE — Evaluation (Signed)
Clinical/Bedside Swallow Evaluation Patient Details  Name: Kenneth [name] MRN: [id number] Date of Birth: 06/15/1965  Today's Date: 10/17/2017 Time: SLP Start Time (ACUTE ONLY): 1655 SLP Stop Time (ACUTE ONLY): 1720 SLP Time Calculation (min) (ACUTE ONLY): 25 min  Past Medical History:  Past Medical History:  Diagnosis Date  . Arthritis   . Chronic generalized pain   . Diabetes mellitus   . DKA (diabetic ketoacidosis) (HCC) 07/2014  . Malnutrition (HCC)   . Neuropathy   . Noncompliance with medication regimen   . Thyroid disease    Past Surgical History: History reviewed. No pertinent surgical history. HPI:  Kenneth [name] is a 52 y.o. male with a history of type 2 diabetes with noncompliance, chronic generalized pain, thyroid disease.  Patient presents to the hospital with 4 days of nausea and vomiting, hyperglycemia with diffuse chest and abdominal pains.  Denies fevers, chills, diarrhea.  He has been very thirsty but unable to tolerate liquids due to vomiting.  His hyperglycemia repeat preceded his nausea and vomiting, and has been worsening despite taking his insulin as prescribed.  He is not forthcoming with information, stating that his second does not want to talk.  He denies fevers, chills, diarrhea, bleeding.  He denies using alcohol or drugs. CT chest showed: apparent prominence of the mid esophagus near the level of the carina. The esophagus appears to contain fluid at this level   Assessment / Plan / Recommendation Clinical Impression  Oral motor examination is WFL, however Pt c/o painful swallow in chest during po consumption. Pt observed with dinner meal. Pt reports that it feels as if food/liquid goes down and "gets stuck". Strongly suspect esophageal dysphagia and would recommend Barium swallow, but may end up needing GI consult with possible EGD. Will notify RN. OK to continue diet as ordered for now. Pt encouraged to take small bites/sips, chew thoroughly, and try small sips  of warm liquids.   SLP Visit Diagnosis: Dysphagia, unspecified (R13.10)    Aspiration Risk  Mild aspiration risk    Diet Recommendation Dysphagia 3 (Mech soft);Thin liquid;Regular   Liquid Administration via: Cup;Straw Medication Administration: Whole meds with liquid Supervision: Patient able to self feed Compensations: Slow rate Postural Changes: Seated upright at 90 degrees;Remain upright for at least 30 minutes after po intake    Other  Recommendations Recommended Consults: Consider GI evaluation;Consider esophageal assessment Oral Care Recommendations: Oral care BID Other Recommendations: Clarify dietary restrictions   Follow up Recommendations None      Frequency and Duration min 2x/week  1 week       Prognosis Prognosis for Safe Diet Advancement: Good      Swallow Study   General Date of Onset: 10/16/17 HPI: Kenneth [name] is a 52 y.o. male with a history of type 2 diabetes with noncompliance, chronic generalized pain, thyroid disease.  Patient presents to the hospital with 4 days of nausea and vomiting, hyperglycemia with diffuse chest and abdominal pains.  Denies fevers, chills, diarrhea.  He has been very thirsty but unable to tolerate liquids due to vomiting.  His hyperglycemia repeat preceded his nausea and vomiting, and has been worsening despite taking his insulin as prescribed.  He is not forthcoming with information, stating that his second does not want to talk.  He denies fevers, chills, diarrhea, bleeding.  He denies using alcohol or drugs. CT chest showed: apparent prominence of the mid esophagus near the level of the carina. The esophagus appears to contain fluid at this level  Type of Study: Bedside Swallow Evaluation Diet Prior to this Study: Regular;Thin liquids Temperature Spikes Noted: No Respiratory Status: Room air History of Recent Intubation: No Behavior/Cognition: Alert;Cooperative;Pleasant mood Oral Cavity Assessment: Within Functional  Limits Oral Care Completed by SLP: No Oral Cavity - Dentition: Missing dentition Vision: Functional for self-feeding Self-Feeding Abilities: Able to feed self Patient Positioning: Upright in bed Baseline Vocal Quality: Normal Volitional Cough: Strong Volitional Swallow: Able to elicit    Oral/Motor/Sensory Function Overall Oral Motor/Sensory Function: Within functional limits   Ice Chips Ice chips: Within functional limits Presentation: Spoon   Thin Liquid Thin Liquid: Within functional limits Presentation: Self Fed;Straw    Nectar Thick Nectar Thick Liquid: Not tested   Honey Thick Honey Thick Liquid: Not tested   Puree Puree: Within functional limits Presentation: Spoon;Self Fed   Solid     Solid: Within functional limits Presentation: Self Fed Other Comments: PT c/o pain during swallow- mid chest     Thank you,  Havery Moros, CCC-SLP [phone]  Donice Alperin 10/17/2017,5:21 PM

## 2017-10-17 NOTE — Progress Notes (Signed)
RN spoke with Dr. Sharl MaLama- new order for Lantus 10 units sub-q now. NS @ KVO and CM diet. Will d/c insulin gtt 2 hours after giving Lantus per DKA protocol. Waiting for pharmacy to verify Insulin and [facility] and [facility].

## 2017-10-17 NOTE — Progress Notes (Signed)
And placed on Glucomander protocol for DKA now normoglycemic Switch to long-acting and short-acting combinations of insulin to mimic his daily life along with a carb modified diet patient likewise being treated for possibility of septic shock with dual antibiotics lactic acid had normalized and mildly elevated again subsequently his anion gap is closed Kenneth RainwaterRandy C [name] QIO:[id number] DOB: 08/25/1965 DOA: 10/16/2017 PCP: Oval Linseyondiego, Anjelique Makar, MD   Physical Exam: Blood pressure 101/63, pulse 80, temperature 97.7 F (36.5 C), resp. rate 15, height 5\' 9"  (1.753 m), weight 52.2 kg (115 lb 1.3 oz), SpO2 100 %.  Lungs show coarse rhonchi bilaterally prolonged expiratory phase no rales no wheeze heart regular rhythm no murmurs gallops heaves thrills or rubs   Investigations:  Recent Results (from the past 240 hour(s))  Blood culture (routine x 2)     Status: None (Preliminary result)   Collection Time: 10/16/17  4:52 PM  Result Value Ref Range Status   Specimen Description BLOOD LEFT ARM BOTTLES DRAWN AEROBIC AND ANAEROBIC  Final   Special Requests Blood Culture adequate volume  Final   Culture   Final    NO GROWTH < 24 HOURS Performed at [facility], [address] [place], [place] [address]    Report Status PENDING  Incomplete  Blood culture (routine x 2)     Status: None (Preliminary result)   Collection Time: 10/16/17  4:54 PM  Result Value Ref Range Status   Specimen Description   Final    BLOOD RIGHT HAND BOTTLES DRAWN AEROBIC AND ANAEROBIC   Special Requests Blood Culture adequate volume  Final   Culture   Final    NO GROWTH < 24 HOURS Performed at [facility], [address] [place], [place] [address]    Report Status PENDING  Incomplete  MRSA PCR Screening     Status: None   Collection Time: 10/16/17  8:22 PM  Result Value Ref Range Status   MRSA by PCR NEGATIVE NEGATIVE Final    Comment:        The GeneXpert MRSA Assay (FDA approved for NASAL specimens only), is one component of  a comprehensive MRSA colonization surveillance program. It is not intended to diagnose MRSA infection nor to guide or monitor treatment for MRSA infections. Performed at [facility], [address] [place], [place] [address]      Basic Metabolic Panel: Recent Labs    10/16/17 2355 10/17/17 0301  NA 144 144  K 3.8 3.7  CL 116* 117*  CO2 16* 22  GLUCOSE 249* 136*  BUN 30* 26*  CREATININE 1.94* 1.40*  CALCIUM 7.6* 7.5*  MG  --  2.0   Liver Function Tests: Recent Labs    10/16/17 1614  AST 23  ALT 26  ALKPHOS 68  BILITOT 2.2*  PROT 6.1*  ALBUMIN 3.4*     CBC: Recent Labs    10/16/17 1614 10/17/17 0301  WBC 16.2* 11.2*  NEUTROABS 14.0* 8.5*  HGB 13.3 12.1*  HCT 43.5 34.8*  MCV 105.8* 92.3  PLT 287 211    Ct Abdomen Pelvis Wo Contrast  Result Date: 10/16/2017 CLINICAL DATA:  Hyperglycemia and hypotension.  Vomiting. EXAM: CT CHEST, ABDOMEN AND PELVIS WITHOUT CONTRAST TECHNIQUE: Multidetector CT imaging of the chest, abdomen and pelvis was performed following the standard protocol without IV contrast. COMPARISON:  Chest CT April 04, 2016 and CT the abdomen and pelvis February 29, 2016 FINDINGS: CT CHEST FINDINGS Cardiovascular: Minimal coronary artery calcifications. The thoracic aorta is normal in caliber.  The pulmonary arteries are unremarkable. The heart size is normal. Mediastinum/Nodes: The thyroid is normal. The esophagus is mildly prominent at and just below the level the carina and appears to contain some fluid. There may be some associated wall thickening as well. The distal esophagus is normal. No adenopathy. No effusions. Lungs/Pleura: Central airways are normal. No pneumothorax. No pulmonary nodules, masses, or infiltrates. Musculoskeletal: No chest wall mass or suspicious bone lesions identified. CT ABDOMEN PELVIS FINDINGS Evaluation of the abdomen limited due to the paucity of intra-abdominal fat and the lack of intravenous and oral contrast.  Hepatobiliary: No focal liver abnormality is seen. No gallstones, gallbladder wall thickening, or biliary dilatation. Pancreas: Unremarkable. No pancreatic ductal dilatation or surrounding inflammatory changes. Spleen: Normal in size without focal abnormality. Adrenals/Urinary Tract: Adrenal glands are normal. No renal stones or perinephric stranding. Pelvicaliectasis bilaterally, left greater than right, is similar since December 2017. No ureteral stones noted. There is a Foley catheter in the bladder but the bladder remains distended. Stomach/Bowel: The stomach is moderately distended with fluid and air. The proximal duodenum is mildly distended as well, particularly the distal second portion as seen on axial image 78. The remainder of the small bowel is unremarkable. The colon is unremarkable. The appendix is not seen but there is no secondary evidence of appendicitis. Vascular/Lymphatic: Mild atherosclerotic change in the nonaneurysmal aorta. No adenopathy. Reproductive: Prostate is unremarkable. Other: No abdominal wall hernia or abnormality. No abdominopelvic ascites. Musculoskeletal: No acute or significant osseous findings. IMPRESSION: 1. There is apparent prominence of the mid esophagus near the level of the carina. The esophagus appears to contain fluid at this level and may be thick walled. Esophagitis is a possibility. Neoplasm not excluded on this study but probably less likely. This could be further evaluated with an upper GI or endoscopy. 2. Moderate distention of the stomach and proximal duodenum of uncertain etiology. This could be related to the patient's symptoms of vomiting. The remainder of the small bowel and colon are normal. The appendix is not seen but there is no secondary evidence of appendicitis. 3. Mild pelvicaliectasis bilaterally is stable since 2017 and not acute. 4. Mild distention the bladder despite a Foley catheter. 5. Mild coronary artery calcifications. Mild atherosclerotic  changes in the abdominal aorta. Electronically Signed   By: Gerome Sam III M.D   On: 10/16/2017 19:30   Ct Chest Wo Contrast  Result Date: 10/16/2017 CLINICAL DATA:  Hyperglycemia and hypotension.  Vomiting. EXAM: CT CHEST, ABDOMEN AND PELVIS WITHOUT CONTRAST TECHNIQUE: Multidetector CT imaging of the chest, abdomen and pelvis was performed following the standard protocol without IV contrast. COMPARISON:  Chest CT April 04, 2016 and CT the abdomen and pelvis February 29, 2016 FINDINGS: CT CHEST FINDINGS Cardiovascular: Minimal coronary artery calcifications. The thoracic aorta is normal in caliber. The pulmonary arteries are unremarkable. The heart size is normal. Mediastinum/Nodes: The thyroid is normal. The esophagus is mildly prominent at and just below the level the carina and appears to contain some fluid. There may be some associated wall thickening as well. The distal esophagus is normal. No adenopathy. No effusions. Lungs/Pleura: Central airways are normal. No pneumothorax. No pulmonary nodules, masses, or infiltrates. Musculoskeletal: No chest wall mass or suspicious bone lesions identified. CT ABDOMEN PELVIS FINDINGS Evaluation of the abdomen limited due to the paucity of intra-abdominal fat and the lack of intravenous and oral contrast. Hepatobiliary: No focal liver abnormality is seen. No gallstones, gallbladder wall thickening, or biliary dilatation. Pancreas: Unremarkable. No pancreatic  ductal dilatation or surrounding inflammatory changes. Spleen: Normal in size without focal abnormality. Adrenals/Urinary Tract: Adrenal glands are normal. No renal stones or perinephric stranding. Pelvicaliectasis bilaterally, left greater than right, is similar since December 2017. No ureteral stones noted. There is a Foley catheter in the bladder but the bladder remains distended. Stomach/Bowel: The stomach is moderately distended with fluid and air. The proximal duodenum is mildly distended as well,  particularly the distal second portion as seen on axial image 78. The remainder of the small bowel is unremarkable. The colon is unremarkable. The appendix is not seen but there is no secondary evidence of appendicitis. Vascular/Lymphatic: Mild atherosclerotic change in the nonaneurysmal aorta. No adenopathy. Reproductive: Prostate is unremarkable. Other: No abdominal wall hernia or abnormality. No abdominopelvic ascites. Musculoskeletal: No acute or significant osseous findings. IMPRESSION: 1. There is apparent prominence of the mid esophagus near the level of the carina. The esophagus appears to contain fluid at this level and may be thick walled. Esophagitis is a possibility. Neoplasm not excluded on this study but probably less likely. This could be further evaluated with an upper GI or endoscopy. 2. Moderate distention of the stomach and proximal duodenum of uncertain etiology. This could be related to the patient's symptoms of vomiting. The remainder of the small bowel and colon are normal. The appendix is not seen but there is no secondary evidence of appendicitis. 3. Mild pelvicaliectasis bilaterally is stable since 2017 and not acute. 4. Mild distention the bladder despite a Foley catheter. 5. Mild coronary artery calcifications. Mild atherosclerotic changes in the abdominal aorta. Electronically Signed   By: Gerome Sam III M.D   On: 10/16/2017 19:30   Dg Chest Port 1 View  Result Date: 10/16/2017 CLINICAL DATA:  Acute chest pain. EXAM: PORTABLE CHEST 1 VIEW COMPARISON:  06/16/2017 FINDINGS: The cardiomediastinal silhouette is unremarkable. There is no evidence of focal airspace disease, pulmonary edema, suspicious pulmonary nodule/mass, pleural effusion, or pneumothorax. No acute bony abnormalities are identified. IMPRESSION: No active disease. Electronically Signed   By: [name] Pier M.D.   On: 10/16/2017 17:02      Medications:   Impression:  Principal Problem:   Severe sepsis with  septic shock (CODE) (HCC) Active Problems:   DKA (diabetic ketoacidoses) (HCC)   Hyperkalemia   Diabetes mellitus (HCC)   Cocaine abuse (HCC)   Lactic acidosis   Acute renal injury (HCC)   Esophagitis     Plan: We will add Lantus 18 units subcu at bedtime we will switch to carb modified diet encourage oral eating continue with NovoLog sliding scale as prescribed we will maintain dual antibiotics until blood cultures have shown results  Consultants:    Procedures   Antibiotics:        Time spent: 30 minutes   LOS: 1 day   Evianna Chandran M   10/17/2017, 1:10 PM

## 2017-10-17 NOTE — Progress Notes (Signed)
C/O difficulty swallowing and epigastic pain.  Requesting antiacid

## 2017-10-17 NOTE — Progress Notes (Signed)
Hypoglycemic Event  CBG: 54 checked at 0740  Treatment: Orange Juice Breakfast delivered   Symptoms: None   Follow-up CBG: Time: 0800 CBG Result:52  Possible Reasons for Event: NPO while on insulin drip. Drip DC at 0630 Comments/MD notified:Dr. [name] Half amp of D50 given will recheck sugar at [facility]0825    Kenneth [name]

## 2017-10-17 NOTE — Progress Notes (Signed)
Dr. Janna Archondiego ordered speech swallow study and tramadol for pain

## 2017-10-17 NOTE — Progress Notes (Signed)
BG 124 after intake of 118 mL orange juice, 222 mL coke, small bites of breakfast and 1/2 amp D50.

## 2017-10-17 NOTE — Progress Notes (Signed)
Inpatient Diabetes Program Recommendations  AACE/ADA: New Consensus Statement on Inpatient Glycemic Control (2015)  Target Ranges:  Prepandial:   less than 140 mg/dL      Peak postprandial:   less than 180 mg/dL (1-2 hours)      Critically ill patients:  140 - 180 mg/dL   Lab Results  Component Value Date   GLUCAP 124 (H) 10/17/2017   HGBA1C 10.9 (H) 06/16/2017    Review of Glycemic Control  Diabetes history: DM Outpatient Diabetes medications: Lantus 23 units + Novolog 12 units tid Current orders for Inpatient glycemic control: Lantus 10 units + Novolog sensitive correction tid  Inpatient Diabetes Program Recommendations:   Noted patient admitted in DKA.Noted patient was admitted into hospital  Spoke with nurse Herbert SetaHeather and attempted to call patient in room. Will reattempt to speak with patient. Noted creatinine is improving. Will need meal coverage added when eating improves.  Will follow.  Thank you, Billy FischerJudy E. Kylin Dubs, RN, MSN, CDE  Diabetes Coordinator Inpatient Glycemic Control Team Team Pager [phone] (8am-5pm) 10/17/2017 11:22 AM

## 2017-10-17 NOTE — Progress Notes (Addendum)
Lab called critical serum os of 370.  Dr. Janna Archondiego ordered a bmet now.  Called lab .  Patient transferred from [facility] around 1200.  Alert and oriented.  C/O sore throat and requested glucerna shake and did not want to eat.

## 2017-10-18 ENCOUNTER — Inpatient Hospital Stay (HOSPITAL_COMMUNITY): Payer: Medicaid Other

## 2017-10-18 LAB — BASIC METABOLIC PANEL
Anion gap: 5 (ref 5–15)
BUN: 15 mg/dL (ref 6–20)
CALCIUM: 7.9 mg/dL — AB (ref 8.9–10.3)
CO2: 28 mmol/L (ref 22–32)
CREATININE: 0.82 mg/dL (ref 0.61–1.24)
Chloride: 104 mmol/L (ref 98–111)
GFR calc Af Amer: >60 mL/min (ref >60)
Glucose, Bld: 167 mg/dL — ABNORMAL HIGH (ref 70–99)
POTASSIUM: 3.6 mmol/L (ref 3.5–5.1)
SODIUM: 137 mmol/L (ref 135–145)

## 2017-10-18 LAB — GLUCOSE, CAPILLARY
GLUCOSE-CAPILLARY: 266 mg/dL — AB (ref 70–99)
GLUCOSE-CAPILLARY: 129 mg/dL — AB (ref 70–99)
Glucose-Capillary: 179 mg/dL — ABNORMAL HIGH (ref 70–99)
Glucose-Capillary: 199 mg/dL — ABNORMAL HIGH (ref 70–99)

## 2017-10-18 LAB — PROCALCITONIN: Procalcitonin: 4.41 ng/mL

## 2017-10-18 MED ORDER — INSULIN STARTER KIT- PEN NEEDLES (ENGLISH)
1.0000 | Freq: Once | Status: AC
  Administered 2017-10-18: 1
  Filled 2017-10-18: qty 1

## 2017-10-18 MED ORDER — VANCOMYCIN HCL 500 MG IV SOLR
500.0000 mg | Freq: Two times a day (BID) | INTRAVENOUS | Status: DC
  Filled 2017-10-18 (×3): qty 500

## 2017-10-18 MED ORDER — INSULIN GLARGINE 100 UNIT/ML ~~LOC~~ SOLN
12.0000 [IU] | Freq: Every day | SUBCUTANEOUS | Status: DC
  Administered 2017-10-18 – 2017-10-20 (×3): 12 [IU] via SUBCUTANEOUS
  Filled 2017-10-18 (×6): qty 0.12

## 2017-10-18 NOTE — Progress Notes (Signed)
PHARMACY NOTE:  ANTIMICROBIAL RENAL DOSAGE ADJUSTMENT  Current antimicrobial regimen includes a mismatch between antimicrobial dosage and estimated renal function.  As per policy approved by the Pharmacy & Therapeutics and Medical Executive Committees, the antimicrobial dosage will be adjusted accordingly.   Current antimicrobial dosage:  Vancomycin 500 mg IV every 48 hours  Indication: sepsis  Renal Function:  Estimated Creatinine Clearance: 76 mL/min (by C-G formula based on SCr of 0.82 mg/dL). []      On intermittent HD, scheduled: []      On CRRT    Antimicrobial dosage has been changed to:  Vancomycin 500 mg IV every 12 hours   Additional comments:   Thank you for allowing pharmacy to be a part of this patient's care.  Tad MooreSteven C Demarquez Ciolek, [facility]PH 10/18/2017 11:52 AM

## 2017-10-18 NOTE — Progress Notes (Signed)
Initial Nutrition Assessment  DOCUMENTATION CODES:   Non-severe (moderate) malnutrition in context of chronic illness, Underweight  INTERVENTION:  -Limited opportunity for diet education due to homelessness Talked about local food banks- he is aware and has used these in the past   NUTRITION DIAGNOSIS:   Moderate Malnutrition related to chronic illness(poorly controlled diabetes, homelessness contributing to inconsistent meal intake and management of disease) as evidenced by per patient/family report, moderate fat depletion, moderate muscle depletion.   GOAL:  Patient will meet greater than or equal to 90% of their needs   MONITOR:   PO intake, Labs, Weight trends  REASON FOR ASSESSMENT:   Malnutrition Screening Tool    ASSESSMENT:  Patient presents with DKA complaining of swallow problems and has been assessed by ST. He has a hx of cocaine abuse. Patient reports to be homeless. RD drawn to patient due to malnutrition screen.   No acute weight loss noted the past 1.5 years. His weight has been between 114-121 lb since December of 2017. He is chronically underweight and chronically undernourished. History of poorly controlled diabetes.   Ref. Range 02/23/2016 13:39 03/31/2016 08:20 06/10/2016 14:36 06/16/2017 03:30 10/16/2017 19:40  Hemoglobin A1C Latest Ref Range: 4.8 - 5.6 % 13.2 (H) 12.2 (H) 11.6 (H) 10.9 (H) 11.2 (H)   Patient is difficult to assess diet hx and usual intake; says he eats whatever he can find. We talked about local resources for food and says he has visited the [address], [employer], [employer] and [employer].     Patient easily meets criteria for moderate malnutrition and he is high risk for worsening nutrition status given his socioeconomic situation, drug abuse and limited access to nutritious foods.  Labs: A1C-11.2%  BMP Latest Ref Rng & Units 10/18/2017 10/17/2017 10/17/2017  Glucose 70 - 99 mg/dL 161(W) 960(A) 540(J)  BUN 6 - 20 mg/dL 15 20 81(X)   Creatinine 0.61 - 1.24 mg/dL 9.14 7.82 9.56(O)  Sodium 135 - 145 mmol/L 137 137 144  Potassium 3.5 - 5.1 mmol/L 3.6 3.9 3.7  Chloride 98 - 111 mmol/L 104 108 117(H)  CO2 22 - 32 mmol/L 28 23 22   Calcium 8.9 - 10.3 mg/dL 7.9(L) 7.9(L) 7.5(L)    Medications reviewed and include: neurontin, insulin (short and long acting), prozac, pepcid and protonix.   NUTRITION - FOCUSED PHYSICAL EXAM:    Most Recent Value  Orbital Region  Mild depletion  Upper Arm Region  Moderate depletion  Thoracic and Lumbar Region  Moderate depletion  Buccal Region  Mild depletion  Temple Region  Mild depletion  Clavicle Bone Region  Severe depletion  Clavicle and Acromion Bone Region  Moderate depletion  Scapular Bone Region  Moderate depletion  Dorsal Hand  No depletion  Patellar Region  Moderate depletion  Anterior Thigh Region  Moderate depletion  Posterior Calf Region  Moderate depletion  Edema (RD Assessment)  None  Hair  Reviewed  Eyes  Reviewed  Mouth  Reviewed [endentulous]  Skin  Reviewed  Nails  Reviewed       Diet Order:   Diet Order           Diet Carb Modified Fluid consistency: Thin; Room service appropriate? Yes  Diet effective now          EDUCATION NEEDS:   Education needs have been addressed  Skin:  Skin Assessment: Reviewed RN Assessment  Last BM:  10/13/17  Height:   Ht Readings from Last 1 Encounters:  10/16/17 5\' 9"  (  1.753 m)    Weight:   Wt Readings from Last 1 Encounters:  10/16/17 115 lb 1.3 oz (52.2 kg)    Ideal Body Weight:  73 kg  BMI:  Body mass index is 16.99 kg/m.  Estimated Nutritional Needs:   Kcal:  1800-1976 (35-38 kcal/kg/bw)   Protein:  78-93 gr (1.5-1.8 gr/kg/bw)   Fluid:  1.8 liters daily  Royann ShiversLynn Catarina Huntley MS,RD,CSG,LDN Office: [phone] Pager: [phone]

## 2017-10-18 NOTE — Progress Notes (Signed)
Inpatient Diabetes Program Recommendations  AACE/ADA: New Consensus Statement on Inpatient Glycemic Control (2015)  Target Ranges:  Prepandial:   less than 140 mg/dL      Peak postprandial:   less than 180 mg/dL (1-2 hours)      Critically ill patients:  140 - 180 mg/dL   Lab Results  Component Value Date   GLUCAP 179 (H) 10/18/2017   HGBA1C 11.2 (H) 10/16/2017    Review of Glycemic Control  Diabetes history: DM Outpatient Diabetes medications: Lantus 23 units + Novolog 12 units tid Current orders for Inpatient glycemic control:  Lantus 10 units am + 18 units pm Inpatient Diabetes Program Recommendations:   Spoke with RN Izora Gala who plans to check Lantus orders with MD.  -Decrease Lantus to the 18 units daily -Add meal coverage Novolog as needed  Spoke with pt about by phone (DM coordinator not on campus)A1C results 11.2 with them and explained what an A1C is, basic home care, basic diabetes diet nutrition principles, importance of checking CBGs and maintaining good CBG control to prevent long-term and short-term complications. Reviewed signs and symptoms of hyperglycemia and hypoglycemia and how to treat hypoglycemia at home. Also reviewed blood sugar goals at home.  RNs to provide ongoing basic DM education at bedside with this patient. Have ordered insulin starter kit. Patient is homeless and states he is currently getting a place soon, but has not been able to eat well.  Thank you, Nani Gasser. Erica Richwine, RN, MSN, CDE  Diabetes Coordinator Inpatient Glycemic Control Team Team Pager [phone] (8am-5pm) 10/18/2017 10:58 AM

## 2017-10-18 NOTE — Progress Notes (Signed)
Patient has long-term insulin adjusted readjusted to 12 units of Lantus at bedtime.  His modified barium swallowing study is inconclusive he complains of dysphagia further down therefore we will get a esophagram with tablet or liquid barium to elucidate patient taken off of all antibiotics no evidence of septicemia noticed lactic acid and procalcitonin within normal parameters.  Patient states he has difficulty eating due to dysphagia and therefore cannot increase insulin until he is able to tolerate p.o.'s better.  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 rate 18, height 5\' 9"  (1.753 m), weight 52.2 kg (115 lb 1.3 oz), SpO2 100 %.  Lungs show scattered coarse rhonchi diminished breath sounds in the bases prolonged expiratory phase no rales no wheeze noted heart regular rhythm no S3-S4 no heaves thrills rubs abdomen soft nontender bowel sounds normoactive   Investigations:  Recent Results (from the past 240 hour(s))  Blood culture (routine x 2)     Status: None (Preliminary result)   Collection Time: 10/16/17  4:52 PM  Result Value Ref Range Status   Specimen Description BLOOD LEFT ARM BOTTLES DRAWN AEROBIC AND ANAEROBIC  Final   Special Requests Blood Culture adequate volume  Final   Culture   Final    NO GROWTH 2 DAYS Performed at [facility], [address] [place], [place] [address]    Report Status PENDING  Incomplete  Blood culture (routine x 2)     Status: None (Preliminary result)   Collection Time: 10/16/17  4:54 PM  Result Value Ref Range Status   Specimen Description   Final    BLOOD RIGHT HAND BOTTLES DRAWN AEROBIC AND ANAEROBIC   Special Requests Blood Culture adequate volume  Final   Culture   Final    NO GROWTH 2 DAYS Performed at [facility],  [address] [place], [place] [address]    Report Status PENDING  Incomplete  MRSA PCR Screening     Status: None   Collection Time: 10/16/17  8:22 PM  Result Value Ref Range Status   MRSA by PCR NEGATIVE NEGATIVE Final    Comment:        The GeneXpert MRSA Assay (FDA approved for NASAL specimens only), is one component of a comprehensive MRSA colonization surveillance program. It is not intended to diagnose MRSA infection nor to guide or monitor treatment for MRSA infections. Performed at [facility], [address] [place], [place] [address]      Basic Metabolic Panel: Recent Labs    10/17/17 0301 10/17/17 1456 10/18/17 0559  NA 144 137 137  K 3.7 3.9 3.6  CL 117* 108 104  CO2 22 23 28   GLUCOSE 136* 186* 167*  BUN 26* 20 15  CREATININE 1.40* 0.89 0.82  CALCIUM 7.5* 7.9* 7.9*  MG 2.0  --   --    Liver Function Tests: Recent Labs    10/16/17 1614  AST 23  ALT 26  ALKPHOS 68  BILITOT 2.2*  PROT 6.1*  ALBUMIN 3.4*     CBC: Recent Labs    10/16/17 1614 10/17/17 0301  WBC 16.2* 11.2*  NEUTROABS 14.0* 8.5*  HGB 13.3 12.1*  HCT 43.5 34.8*  MCV 105.8* 92.3  PLT 287 211    Ct Abdomen Pelvis Wo Contrast  Result Date: 10/16/2017 CLINICAL DATA:  Hyperglycemia  and hypotension.  Vomiting. EXAM: CT CHEST, ABDOMEN AND PELVIS WITHOUT CONTRAST TECHNIQUE: Multidetector CT imaging of the chest, abdomen and pelvis was performed following the standard protocol without IV contrast. COMPARISON:  Chest CT April 04, 2016 and CT the abdomen and pelvis February 29, 2016 FINDINGS: CT CHEST FINDINGS Cardiovascular: Minimal coronary artery calcifications. The thoracic aorta is normal in caliber. The pulmonary arteries are unremarkable. The heart size is normal. Mediastinum/Nodes: The thyroid is normal. The esophagus is mildly prominent at and just below the level the carina and appears to contain some fluid. There may be some associated wall thickening as well. The distal esophagus  is normal. No adenopathy. No effusions. Lungs/Pleura: Central airways are normal. No pneumothorax. No pulmonary nodules, masses, or infiltrates. Musculoskeletal: No chest wall mass or suspicious bone lesions identified. CT ABDOMEN PELVIS FINDINGS Evaluation of the abdomen limited due to the paucity of intra-abdominal fat and the lack of intravenous and oral contrast. Hepatobiliary: No focal liver abnormality is seen. No gallstones, gallbladder wall thickening, or biliary dilatation. Pancreas: Unremarkable. No pancreatic ductal dilatation or surrounding inflammatory changes. Spleen: Normal in size without focal abnormality. Adrenals/Urinary Tract: Adrenal glands are normal. No renal stones or perinephric stranding. Pelvicaliectasis bilaterally, left greater than right, is similar since December 2017. No ureteral stones noted. There is a Foley catheter in the bladder but the bladder remains distended. Stomach/Bowel: The stomach is moderately distended with fluid and air. The proximal duodenum is mildly distended as well, particularly the distal second portion as seen on axial image 78. The remainder of the small bowel is unremarkable. The colon is unremarkable. The appendix is not seen but there is no secondary evidence of appendicitis. Vascular/Lymphatic: Mild atherosclerotic change in the nonaneurysmal aorta. No adenopathy. Reproductive: Prostate is unremarkable. Other: No abdominal wall hernia or abnormality. No abdominopelvic ascites. Musculoskeletal: No acute or significant osseous findings. IMPRESSION: 1. There is apparent prominence of the mid esophagus near the level of the carina. The esophagus appears to contain fluid at this level and may be thick walled. Esophagitis is a possibility. Neoplasm not excluded on this study but probably less likely. This could be further evaluated with an upper GI or endoscopy. 2. Moderate distention of the stomach and proximal duodenum of uncertain etiology. This could be  related to the patient's symptoms of vomiting. The remainder of the small bowel and colon are normal. The appendix is not seen but there is no secondary evidence of appendicitis. 3. Mild pelvicaliectasis bilaterally is stable since 2017 and not acute. 4. Mild distention the bladder despite a Foley catheter. 5. Mild coronary artery calcifications. Mild atherosclerotic changes in the abdominal aorta. Electronically Signed   By: Gerome Sam III M.D   On: 10/16/2017 19:30   Ct Chest Wo Contrast  Result Date: 10/16/2017 CLINICAL DATA:  Hyperglycemia and hypotension.  Vomiting. EXAM: CT CHEST, ABDOMEN AND PELVIS WITHOUT CONTRAST TECHNIQUE: Multidetector CT imaging of the chest, abdomen and pelvis was performed following the standard protocol without IV contrast. COMPARISON:  Chest CT April 04, 2016 and CT the abdomen and pelvis February 29, 2016 FINDINGS: CT CHEST FINDINGS Cardiovascular: Minimal coronary artery calcifications. The thoracic aorta is normal in caliber. The pulmonary arteries are unremarkable. The heart size is normal. Mediastinum/Nodes: The thyroid is normal. The esophagus is mildly prominent at and just below the level the carina and appears to contain some fluid. There may be some associated wall thickening as well. The distal esophagus is normal. No adenopathy. No effusions. Lungs/Pleura: Central airways  are normal. No pneumothorax. No pulmonary nodules, masses, or infiltrates. Musculoskeletal: No chest wall mass or suspicious bone lesions identified. CT ABDOMEN PELVIS FINDINGS Evaluation of the abdomen limited due to the paucity of intra-abdominal fat and the lack of intravenous and oral contrast. Hepatobiliary: No focal liver abnormality is seen. No gallstones, gallbladder wall thickening, or biliary dilatation. Pancreas: Unremarkable. No pancreatic ductal dilatation or surrounding inflammatory changes. Spleen: Normal in size without focal abnormality. Adrenals/Urinary Tract: Adrenal glands  are normal. No renal stones or perinephric stranding. Pelvicaliectasis bilaterally, left greater than right, is similar since December 2017. No ureteral stones noted. There is a Foley catheter in the bladder but the bladder remains distended. Stomach/Bowel: The stomach is moderately distended with fluid and air. The proximal duodenum is mildly distended as well, particularly the distal second portion as seen on axial image 78. The remainder of the small bowel is unremarkable. The colon is unremarkable. The appendix is not seen but there is no secondary evidence of appendicitis. Vascular/Lymphatic: Mild atherosclerotic change in the nonaneurysmal aorta. No adenopathy. Reproductive: Prostate is unremarkable. Other: No abdominal wall hernia or abnormality. No abdominopelvic ascites. Musculoskeletal: No acute or significant osseous findings. IMPRESSION: 1. There is apparent prominence of the mid esophagus near the level of the carina. The esophagus appears to contain fluid at this level and may be thick walled. Esophagitis is a possibility. Neoplasm not excluded on this study but probably less likely. This could be further evaluated with an upper GI or endoscopy. 2. Moderate distention of the stomach and proximal duodenum of uncertain etiology. This could be related to the patient's symptoms of vomiting. The remainder of the small bowel and colon are normal. The appendix is not seen but there is no secondary evidence of appendicitis. 3. Mild pelvicaliectasis bilaterally is stable since 2017 and not acute. 4. Mild distention the bladder despite a Foley catheter. 5. Mild coronary artery calcifications. Mild atherosclerotic changes in the abdominal aorta. Electronically Signed   By: Gerome Sam III M.D   On: 10/16/2017 19:30   Dg Chest Port 1 View  Result Date: 10/16/2017 CLINICAL DATA:  Acute chest pain. EXAM: PORTABLE CHEST 1 VIEW COMPARISON:  06/16/2017 FINDINGS: The cardiomediastinal silhouette is unremarkable.  There is no evidence of focal airspace disease, pulmonary edema, suspicious pulmonary nodule/mass, pleural effusion, or pneumothorax. No acute bony abnormalities are identified. IMPRESSION: No active disease. Electronically Signed   By: [name] Pier M.D.   On: 10/16/2017 17:02      Medications:  Impression:  Principal Problem:   Severe sepsis with septic shock (CODE) (HCC) Active Problems:   DKA (diabetic ketoacidoses) (HCC)   Hyperkalemia   Diabetes mellitus (HCC)   Cocaine abuse (HCC)   Lactic acidosis   Acute renal injury (HCC)   Esophagitis     Plan: DC a.m. Lantus changed to 12 units at bedtime daily tinea sliding scale NovoLog coverage continue carb modified diet.  Obtain barium swallow esophagram  Consultants: Speech therapy   Procedures   Antibiotics:    Time spent: 30 minutes   LOS: 2 days   Aijah Lattner M   10/18/2017, 1:44 PM

## 2017-10-18 NOTE — Progress Notes (Signed)
Teaching done for insulin pen but patient states he had had them before and knows how to use them.

## 2017-10-19 DIAGNOSIS — E440 Moderate protein-calorie malnutrition: Secondary | ICD-10-CM

## 2017-10-19 LAB — BASIC METABOLIC PANEL
Anion gap: 5 (ref 5–15)
BUN: 14 mg/dL (ref 6–20)
CALCIUM: 8.6 mg/dL — AB (ref 8.9–10.3)
CO2: 33 mmol/L — ABNORMAL HIGH (ref 22–32)
CREATININE: 0.6 mg/dL — AB (ref 0.61–1.24)
Chloride: 102 mmol/L (ref 98–111)
GFR calc Af Amer: >60 mL/min (ref >60)
GLUCOSE: 63 mg/dL — AB (ref 70–99)
Potassium: 3.8 mmol/L (ref 3.5–5.1)
SODIUM: 140 mmol/L (ref 135–145)

## 2017-10-19 LAB — GLUCOSE, CAPILLARY
GLUCOSE-CAPILLARY: 180 mg/dL — AB (ref 70–99)
GLUCOSE-CAPILLARY: 262 mg/dL — AB (ref 70–99)
Glucose-Capillary: 107 mg/dL — ABNORMAL HIGH (ref 70–99)
Glucose-Capillary: 148 mg/dL — ABNORMAL HIGH (ref 70–99)
Glucose-Capillary: 283 mg/dL — ABNORMAL HIGH (ref 70–99)

## 2017-10-19 MED ORDER — SUCRALFATE 1 GM/10ML PO SUSP
1.0000 g | Freq: Four times a day (QID) | ORAL | Status: DC
  Administered 2017-10-19 – 2017-10-21 (×8): 1 g via ORAL
  Filled 2017-10-19 (×8): qty 10

## 2017-10-19 MED ORDER — PANTOPRAZOLE SODIUM 40 MG PO TBEC
40.0000 mg | DELAYED_RELEASE_TABLET | Freq: Every day | ORAL | Status: DC
  Administered 2017-10-20 – 2017-10-21 (×2): 40 mg via ORAL
  Filled 2017-10-19 (×3): qty 1

## 2017-10-19 NOTE — Clinical Social Work Note (Signed)
Clinical Social Work Assessment  Patient Details  Name: Kenneth [name] MRN: [id number] Date of Birth: August 30, 1965  Date of referral:  10/19/17               Reason for consult:  Housing Concerns/Homelessness                Permission sought to share information with:    Permission granted to share information::     Name::        Agency::     Relationship::     Contact Information:     Housing/Transportation Living arrangements for the past 2 months:  No permanent address Source of Information:  Patient Patient Interpreter Needed:  None Criminal Activity/Legal Involvement Pertinent to Current Situation/Hospitalization:  No - Comment as needed Significant Relationships:  Siblings Lives with:  Self Do you feel safe going back to the place where you live?  No Need for family participation in patient care:  No (Coment)  Care giving concerns: Pt has been homeless   Facilities manager / plan: Pt is a 52 year old male referred to CSW for issues related to homelessness. Met with pt this AM to assess. Per pt, he has been staying here and there since his last hospitalization. Provided verbal and written information on transitional housing options in the area though pt stated that he isn't going to go to a shelter. Pt states he will figure out where to stay. Per pt, his SSI Disability was just approved and he should be getting his money on 8/1. Pt states that he wants to apply for low income housing. He has a Product/process development scientist with the county. Provided phone number to Social Services and encouraged patient to call his caseworker to discuss housing. Pt also needs to talk to someone at [employer] about the amount of withholding from his check. Encouraged pt to follow up on these issues and will plan to follow up with pt tomorrow to further assist with dc planning.  Employment status:  Disabled (Comment on whether or not currently receiving Disability) Insurance information:  Medicaid [place]  Recommendations:  Not assessed at this time Information / Referral to community resources:  Shelter, Outpatient Substance Abuse Treatment Options  Patient/Family's Response to care: Pt accepting of care.  Patient/Family's Understanding of and Emotional Response to Diagnosis, Current Treatment, and Prognosis: Pt appears to have a basic understanding of his diagnosis and treatment recommendations. No emotional distress identified.  Emotional Assessment Appearance:  Appears stated age Attitude/Demeanor/Rapport:  Engaged Affect (typically observed):  Calm, Pleasant Orientation:  Oriented to Self, Oriented to Place, Oriented to  Time, Oriented to Situation Alcohol / Substance use:  Illicit Drugs Psych involvement (Current and /or in the community):  No (Comment)  Discharge Needs  Concerns to be addressed:  Discharge Planning Concerns, Substance Abuse Concerns, Homelessness Readmission within the last 30 days:  No Current discharge risk:  Homeless, Chronically ill, Lack of support system, Substance Abuse Barriers to Discharge:  Homeless with medical needs, Active Substance Use   Shade Flood, LCSW 10/19/2017, 12:53 PM

## 2017-10-19 NOTE — Progress Notes (Signed)
She has much better swallowing eating at present complains of some mild to moderate dysphagia esophagram reveals no evidence of severe obstruction we will add on Protonix 40 p.o. daily and advance diet good glycemic control glucoses ranging from 100-1 50s with 18 units of Lantus at bedtime expect increasing gli see make index with increasing diet Kenneth RainwaterRandy C [name] [id number]:[id number] DOB: 12/08/1965 DOA: 10/16/2017 PCP: Kenneth [name], Kenneth [name], Kenneth [name]   Physical Exam: Blood pressure 103/70, pulse 66, temperature 98.3 F (36.8 C), resp. rate 18, height 5\' 9"  (1.753 m), weight 52.2 kg (115 lb 1.3 oz), SpO2 100 %.  Lungs clear to A&P no rales or rhonchi heart regular rhythm no murmurs gallops heaves or rubs abdomen soft nontender bowel sounds normal   Investigations:  Recent Results (from the past 240 hour(s))  Blood culture (routine x 2)     Status: None (Preliminary result)   Collection Time: 10/16/17  4:52 PM  Result Value Ref Range Status   Specimen Description BLOOD LEFT ARM BOTTLES DRAWN AEROBIC AND ANAEROBIC  Final   Special Requests Blood Culture adequate volume  Final   Culture   Final    NO GROWTH 3 DAYS Performed at [facility], [address] [place], [place] [address]    Report Status PENDING  Incomplete  Blood culture (routine x 2)     Status: None (Preliminary result)   Collection Time: 10/16/17  4:54 PM  Result Value Ref Range Status   Specimen Description   Final    BLOOD RIGHT HAND BOTTLES DRAWN AEROBIC AND ANAEROBIC   Special Requests Blood Culture adequate volume  Final   Culture   Final    NO GROWTH 3 DAYS Performed at [facility], [address] [place], [place] [address]    Report Status PENDING  Incomplete  MRSA PCR Screening     Status: None   Collection Time: 10/16/17  8:22 PM  Result Value Ref Range Status   MRSA by PCR NEGATIVE NEGATIVE Final    Comment:        The GeneXpert MRSA Assay (FDA approved for NASAL specimens only), is one component of a comprehensive MRSA  colonization surveillance program. It is not intended to diagnose MRSA infection nor to guide or monitor treatment for MRSA infections. Performed at [facility], [address] [place], [place] [address]      Basic Metabolic Panel: Recent Labs    10/17/17 0301  10/18/17 0559 10/19/17 0543  NA 144   < > 137 140  K 3.7   < > 3.6 3.8  CL 117*   < > 104 102  CO2 22   < > 28 33*  GLUCOSE 136*   < > 167* 63*  BUN 26*   < > 15 14  CREATININE 1.40*   < > 0.82 0.60*  CALCIUM 7.5*   < > 7.9* 8.6*  MG 2.0  --   --   --    < > = values in this interval not displayed.   Liver Function Tests: Recent Labs    10/16/17 1614  AST 23  ALT 26  ALKPHOS 68  BILITOT 2.2*  PROT 6.1*  ALBUMIN 3.4*     CBC: Recent Labs    10/16/17 1614 10/17/17 0301  WBC 16.2* 11.2*  NEUTROABS 14.0* 8.5*  HGB 13.3 12.1*  HCT 43.5 34.8*  MCV 105.8* 92.3  PLT 287 211    Dg Esophagus  Result Date: 10/18/2017 CLINICAL DATA:  Chest pain with swallowing, dysphagia,  odynophagia, diabetes mellitus EXAM: ESOPHOGRAM / BARIUM SWALLOW / BARIUM TABLET STUDY TECHNIQUE: Combined double contrast and single contrast examination performed using effervescent crystals, thick barium liquid, and thin barium liquid. The patient was observed with fluoroscopy swallowing a 13 mm barium sulphate tablet. Abbreviated exam due to patient condition, with AP view of swallowing and RAO prone imaging not performed. Patient experienced significant chest pain after swallowing the effervescent granules. FLUOROSCOPY TIME:  Fluoroscopy Time:  1 minutes 48 seconds Radiation Exposure Index (if provided by the fluoroscopic device): 19.8 mGy Number of Acquired Spot Images: None COMPARISON:  None FINDINGS: Esophageal distention: Normal without mass or stricture Filling defects:  None 12.5 mm barium tablet: Passed from oral cavity to stomach without delay Motility:  Grossly normal Mucosa: Question subtle mucosal irregularity at the distal  esophagus raising question of esophagitis. Hypopharynx/cervical esophagus: Laryngeal penetration and silent aspiration of a small amount of contrast material Hiatal hernia:  Absent GE reflux:  Not witnessed during exam Other:  N/A IMPRESSION: Laryngeal penetration and minimal aspiration of contrast without spontaneous cough reflex. Question subtle mucosal irregularity at the distal esophagus raising question of esophagitis. Electronically Signed   By: Ulyses Southward M.D.   On: 10/18/2017 16:01      Medications  Impression:  Principal Problem:   Severe sepsis with septic shock (CODE) (HCC) Active Problems:   DKA (diabetic ketoacidoses) (HCC)   Hyperkalemia   Diabetes mellitus (HCC)   Cocaine abuse (HCC)   Lactic acidosis   Acute renal injury (HCC)   Esophagitis   Malnutrition of moderate degree     Plan: Protonix 40 mg p.o. daily, increase to regular diet as tolerated monitor AC and at bedtime glucoses  Consultants:    Procedures barium swallow   Antibiotics:           Time spent: 30 minutes   LOS: 3 days   Kenneth [name] M   10/19/2017, 12:30 PM

## 2017-10-19 NOTE — Progress Notes (Signed)
  Speech Language Pathology Treatment:    Patient Details Name: Kenneth RainwaterRandy C Zambito MRN: [id number] DOB: 03/25/1965 Today's Date: 10/19/2017 Time: [id number] SLP Time Calculation (min) (ACUTE ONLY): 17 min  Assessment / Plan / Recommendation Clinical Impression  SLP reviewed results of esophagram completed yesterday. Pt had one episode of trace, silent aspiration down posterior tracheal wall when taking sequential swallows. Pt continues to c/o odynophagia in esophageal and pharyngeal region, suspicious for esophagitis. D/W RN who called Dr. Janna ArchonDiego to start treatment. SLP reviewed swallow strategies and reflux precautions with an emphasis on taking small sips when drinking liquids and only in upright position. Pt reports that he did "get strangled" when he drank from a straw in supine position and was better when sitting fully upright. Continue diet as ordered and SLP will follow up.   HPI HPI: Kenneth RainwaterRandy C Peach is a [age] y.o. male with a history of type 2 diabetes with noncompliance, chronic generalized pain, thyroid disease.  Patient presents to the hospital with 4 days of nausea and vomiting, hyperglycemia with diffuse chest and abdominal pains.  Denies fevers, chills, diarrhea.  He has been very thirsty but unable to tolerate liquids due to vomiting.  His hyperglycemia repeat preceded his nausea and vomiting, and has been worsening despite taking his insulin as prescribed.  He is not forthcoming with information, stating that his second does not want to talk.  He denies fevers, chills, diarrhea, bleeding.  He denies using alcohol or drugs. CT chest showed: apparent prominence of the mid esophagus near the level of the carina. The esophagus appears to contain fluid at this level      SLP Plan  Continue with current plan of care       Recommendations  Diet recommendations: Dysphagia 3 (mechanical soft);Thin liquid Liquids provided via: Cup;Straw Medication Administration: Whole meds with  liquid Supervision: Patient able to self feed Compensations: Slow rate Postural Changes and/or Swallow Maneuvers: Seated upright 90 degrees;Upright 30-60 min after meal                Oral Care Recommendations: Oral care BID Follow up Recommendations: None SLP Visit Diagnosis: Dysphagia, unspecified (R13.10) Plan: Continue with current plan of care       Thank you,  Havery MorosDabney Porter, CCC-SLP [phone]                PORTER,DABNEY 10/19/2017, 2:36 PM

## 2017-10-19 NOTE — Progress Notes (Signed)
Hypoglycemic Event  CBG: 63 (This RN noticed BG from AM labs)  Treatment: Patient given 240 ml of Coke per request (refused juice). Patient very anxious stating that we were going to kill him because 63 is way too low for him. Patient also eating graham crackers to maintain BG.  Symptoms: Hungry  Follow-up CBG: Time:0707 CBG Result:107  Possible Reasons for Event: Inadequate meal intake  Comments/MD notified: Aundra MilletMegan, RN aware.    Marigene EhlersNicole J Arien Benincasa

## 2017-10-20 LAB — CBC WITH DIFFERENTIAL/PLATELET
Basophils Absolute: 0 10*3/uL (ref 0.0–0.1)
Basophils Relative: 1 %
EOS PCT: 5 %
Eosinophils Absolute: 0.2 10*3/uL (ref 0.0–0.7)
HCT: 37.1 % — ABNORMAL LOW (ref 39.0–52.0)
Hemoglobin: 12.3 g/dL — ABNORMAL LOW (ref 13.0–17.0)
LYMPHS ABS: 1.5 10*3/uL (ref 0.7–4.0)
LYMPHS PCT: 35 %
MCH: 31.6 pg (ref 26.0–34.0)
MCHC: 33.2 g/dL (ref 30.0–36.0)
MCV: 95.4 fL (ref 78.0–100.0)
MONO ABS: 0.4 10*3/uL (ref 0.1–1.0)
MONOS PCT: 9 %
Neutro Abs: 2.1 10*3/uL (ref 1.7–7.7)
Neutrophils Relative %: 50 %
PLATELETS: 142 10*3/uL — AB (ref 150–400)
RBC: 3.89 MIL/uL — ABNORMAL LOW (ref 4.22–5.81)
RDW: 13.8 % (ref 11.5–15.5)
WBC: 4.2 10*3/uL (ref 4.0–10.5)

## 2017-10-20 LAB — GLUCOSE, CAPILLARY
Glucose-Capillary: 104 mg/dL — ABNORMAL HIGH (ref 70–99)
Glucose-Capillary: 342 mg/dL — ABNORMAL HIGH (ref 70–99)
Glucose-Capillary: 280 mg/dL — ABNORMAL HIGH (ref 70–99)
Glucose-Capillary: 320 mg/dL — ABNORMAL HIGH (ref 70–99)

## 2017-10-20 LAB — BASIC METABOLIC PANEL
Anion gap: 6 (ref 5–15)
BUN: 14 mg/dL (ref 6–20)
CO2: 33 mmol/L — ABNORMAL HIGH (ref 22–32)
Calcium: 8.8 mg/dL — ABNORMAL LOW (ref 8.9–10.3)
Chloride: 99 mmol/L (ref 98–111)
Creatinine, Ser: 0.69 mg/dL (ref 0.61–1.24)
GFR calc Af Amer: >60 mL/min (ref >60)
GLUCOSE: 241 mg/dL — AB (ref 70–99)
POTASSIUM: 4.5 mmol/L (ref 3.5–5.1)
Sodium: 138 mmol/L (ref 135–145)

## 2017-10-20 NOTE — Progress Notes (Signed)
  Speech Language Pathology Treatment: Dysphagia  Patient Details Name: Kenneth [name] MRN: [id number] DOB: 03-28-1965 Today's Date: 10/20/2017 Time: [id number] SLP Time Calculation (min) (ACUTE ONLY): 20 min  Assessment / Plan / Recommendation Clinical Impression  Pt seen in room for diet tolerance and upgrades. He reports slight improvement in painful swallow (continues to point to mid chest as source of discomfort). He is asking for a cheeseburger and was told he couldn't have on D3 diet. He also verbalizes that he is going home tomorrow and will eat whatever he wants. Pt was initially placed on D3 due to esophageal concerns, however give improvement, will advance to regular textures. Pt was encouraged to take small bites and chew thoroughly. Suggest GI consult if he continues to c/o esophageal dysphagia. SLP will advance to regular textures and sign off. Reconsult if indicated.    HPI HPI: Kenneth [name] is a 52 y.o. male with a history of type 2 diabetes with noncompliance, chronic generalized pain, thyroid disease.  Patient presents to the hospital with 4 days of nausea and vomiting, hyperglycemia with diffuse chest and abdominal pains.  Denies fevers, chills, diarrhea.  He has been very thirsty but unable to tolerate liquids due to vomiting.  His hyperglycemia repeat preceded his nausea and vomiting, and has been worsening despite taking his insulin as prescribed.  He is not forthcoming with information, stating that his second does not want to talk.  He denies fevers, chills, diarrhea, bleeding.  He denies using alcohol or drugs. CT chest showed: apparent prominence of the mid esophagus near the level of the carina. The esophagus appears to contain fluid at this level      SLP Plan  All goals met;Discharge SLP treatment due to (comment)       Recommendations  Diet recommendations: Regular;Thin liquid Liquids provided via: Cup;Straw Medication Administration: Whole meds with  liquid Supervision: Patient able to self feed Compensations: Slow rate Postural Changes and/or Swallow Maneuvers: Seated upright 90 degrees;Upright 30-60 min after meal                Oral Care Recommendations: Oral care BID Follow up Recommendations: None SLP Visit Diagnosis: Dysphagia, unspecified (R13.10) Plan: All goals met;Discharge SLP treatment due to (comment)       Thank you,  Genene Churn, [place]                 [place] 10/20/2017, 2:46 PM

## 2017-10-20 NOTE — Progress Notes (Signed)
Appreciate speech pathology note have switched to dysphagia 3 diet patient on Protonix 40 mg orally daily glycemic control better than his normal Kenneth RainwaterRandy C [name] [id number] DOB: 09/13/1965 DOA: 10/16/2017 PCP: Oval Linseyondiego, Prue Lingenfelter, MD   Physical Exam: Blood pressure 105/73, pulse 60, temperature 97.7 F (36.5 C), temperature source Oral, resp. rate (!) 21, height 5\' 9"  (1.753 [name]), weight 52.2 kg (115 lb 1.3 oz), SpO2 100 %.  Lungs prolonged expiratory phase scattered rhonchi no rales no wheeze heart regular rhythm no S3-S4 no heaves thrills rubs abdomen soft nontender bowel sounds normoactive  Investigations:  Recent Results (from the past 240 hour(s))  Blood culture (routine x 2)     Status: None (Preliminary result)   Collection Time: 10/16/17  4:52 PM  Result Value Ref Range Status   Specimen Description BLOOD LEFT ARM BOTTLES DRAWN AEROBIC AND ANAEROBIC  Final   Special Requests Blood Culture adequate volume  Final   Culture   Final    NO GROWTH 4 DAYS Performed at [facility], [address] [place], [place] [address]    Report Status PENDING  Incomplete  Blood culture (routine x 2)     Status: None (Preliminary result)   Collection Time: 10/16/17  4:54 PM  Result Value Ref Range Status   Specimen Description   Final    BLOOD RIGHT HAND BOTTLES DRAWN AEROBIC AND ANAEROBIC   Special Requests Blood Culture adequate volume  Final   Culture   Final    NO GROWTH 4 DAYS Performed at [facility], [address] [place], [place] [address]    Report Status PENDING  Incomplete  MRSA PCR Screening     Status: None   Collection Time: 10/16/17  8:22 PM  Result Value Ref Range Status   MRSA by PCR NEGATIVE NEGATIVE Final    Comment:        The GeneXpert MRSA Assay (FDA approved for NASAL specimens only), is one component of a comprehensive MRSA colonization surveillance program. It is not intended to diagnose MRSA infection nor to guide or monitor treatment for MRSA  infections. Performed at [facility], [address] [place], [place] [address]      Basic Metabolic Panel: Recent Labs    10/19/17 0543 10/20/17 0418  NA 140 138  K 3.8 4.5  CL 102 99  CO2 33* 33*  GLUCOSE 63* 241*  BUN 14 14  CREATININE 0.60* 0.69  CALCIUM 8.6* 8.8*   Liver Function Tests: No results for input(s): AST, ALT, ALKPHOS, BILITOT, PROT, ALBUMIN in the last 72 hours.   CBC: Recent Labs    10/20/17 0418  WBC 4.2  NEUTROABS 2.1  HGB 12.3*  HCT 37.1*  MCV 95.4  PLT 142*    Dg Esophagus  Result Date: 10/18/2017 CLINICAL DATA:  Chest pain with swallowing, dysphagia, odynophagia, diabetes mellitus EXAM: ESOPHOGRAM / BARIUM SWALLOW / BARIUM TABLET STUDY TECHNIQUE: Combined double contrast and single contrast examination performed using effervescent crystals, thick barium liquid, and thin barium liquid. The patient was observed with fluoroscopy swallowing a 13 mm barium sulphate tablet. Abbreviated exam due to patient condition, with AP view of swallowing and RAO prone imaging not performed. Patient experienced significant chest pain after swallowing the effervescent granules. FLUOROSCOPY TIME:  Fluoroscopy Time:  1 minutes 48 seconds Radiation Exposure Index (if provided by the fluoroscopic device): 19.8 mGy Number of Acquired Spot Images: None COMPARISON:  None FINDINGS: Esophageal distention: Normal without mass or stricture Filling defects:  None 12.5 mm  barium tablet: Passed from oral cavity to stomach without delay Motility:  Grossly normal Mucosa: Question subtle mucosal irregularity at the distal esophagus raising question of esophagitis. Hypopharynx/cervical esophagus: Laryngeal penetration and silent aspiration of a small amount of contrast material Hiatal hernia:  Absent GE reflux:  Not witnessed during exam Other:  N/A IMPRESSION: Laryngeal penetration and minimal aspiration of contrast without spontaneous cough reflex. Question subtle mucosal irregularity at the  distal esophagus raising question of esophagitis. Electronically Signed   By: Ulyses Southward [name].D.   On: 10/18/2017 16:01      Medications:   Impression:  Principal Problem:   Severe sepsis with septic shock (CODE) (HCC) Active Problems:   DKA (diabetic ketoacidoses) (HCC)   Hyperkalemia   Diabetes mellitus (HCC)   Cocaine abuse (HCC)   Lactic acidosis   Acute renal injury (HCC)   Esophagitis   Malnutrition of moderate degree     Plan: Continue PPI changes despite she 3 diet observe swallowing and observe glycemic control patient improved  Consultants: Speech pathology   Procedures   Antibiotics:           Time spent: 30 minutes   LOS: 4 days   Kenneth [name]   10/20/2017, 1:57 PM

## 2017-10-21 LAB — BASIC METABOLIC PANEL
Anion gap: 6 (ref 5–15)
BUN: 16 mg/dL (ref 6–20)
CHLORIDE: 96 mmol/L — AB (ref 98–111)
CO2: 32 mmol/L (ref 22–32)
Calcium: 9 mg/dL (ref 8.9–10.3)
Creatinine, Ser: 0.78 mg/dL (ref 0.61–1.24)
GFR calc Af Amer: >60 mL/min (ref >60)
GFR calc non Af Amer: >60 mL/min (ref >60)
GLUCOSE: 240 mg/dL — AB (ref 70–99)
Potassium: 4.7 mmol/L (ref 3.5–5.1)
Sodium: 134 mmol/L — ABNORMAL LOW (ref 135–145)

## 2017-10-21 LAB — CULTURE, BLOOD (ROUTINE X 2)
Culture: NO GROWTH
Culture: NO GROWTH
SPECIAL REQUESTS: ADEQUATE
Special Requests: ADEQUATE

## 2017-10-21 LAB — GLUCOSE, CAPILLARY
Glucose-Capillary: 316 mg/dL — ABNORMAL HIGH (ref 70–99)
Glucose-Capillary: 408 mg/dL — ABNORMAL HIGH (ref 70–99)

## 2017-10-21 MED ORDER — FLUOXETINE HCL 20 MG PO CAPS: 20.0000 mg | ORAL_CAPSULE | Freq: Every day | ORAL | 3 refills | Status: DC

## 2017-10-21 MED ORDER — PANTOPRAZOLE SODIUM 40 MG PO TBEC: 40.0000 mg | DELAYED_RELEASE_TABLET | Freq: Every day | ORAL | 3 refills | Status: DC

## 2017-10-21 MED ORDER — INSULIN ASPART 100 UNIT/ML ~~LOC~~ SOLN: 10.0000 [IU] | Freq: Three times a day (TID) | SUBCUTANEOUS | 11 refills | Status: DC

## 2017-10-21 MED ORDER — INSULIN GLARGINE 100 UNIT/ML ~~LOC~~ SOLN
16.0000 [IU] | Freq: Every day | SUBCUTANEOUS | Status: DC
  Filled 2017-10-21 (×3): qty 0.16

## 2017-10-21 MED ORDER — INSULIN GLARGINE 100 UNIT/ML ~~LOC~~ SOLN: 16.0000 [IU] | Freq: Every day | SUBCUTANEOUS | 11 refills | Status: DC

## 2017-10-21 NOTE — Discharge Summary (Signed)
Physician Discharge Summary  Kenneth [name] BZJ:[id number] DOB: 05-01-1965 DOA: 10/16/2017  PCP: [name], MD  Admit date: 10/16/2017 Discharge date: 10/21/2017   Recommendations for Outpatient Follow-up:  Is to take 16 units of Lantus at bedtime nightly along with 10 units of NovoLog before meals 3 times daily with all 3 meals and to monitor his blood sugars with a new glucometer given at time of discharge he is likewise instructed to take Neurontin 300 mg 3 times daily to follow-up in my office within 2 to 5 days time for assessment of glycemic control hydration renal function and pain control he likewise is given Protonix 40 mg p.o. daily for acid reflux Discharge Diagnoses:  Principal Problem:   Severe sepsis with septic shock (CODE) ([place]) Active Problems:   DKA (diabetic ketoacidoses) ([place])   Hyperkalemia   Diabetes mellitus ([place])   Cocaine abuse ([place])   Lactic acidosis   Acute renal injury ([place])   Esophagitis   Malnutrition of moderate degree   Discharge Condition: Good  Filed Weights   10/16/17 1606 10/16/17 2012  Weight: 49.9 kg (110 lb) 52.2 kg (115 lb 1.3 oz)    History of present illness:  The patient is a 52 year old white male known bipolar disorder hypomanic chronic noncompliance oppositional defiance disorder insulin-dependent diabetes peripheral arterial disease acid reflux who was admitted to the hospital with glucoses in the 800s diabetic ketoacidosis put on Glucomander protocol ICU given aggressive fluid resuscitation this was rectified within 24 hours switch to long and short acting insulin he states he stopped his insulin for 3 days prior to admission for unknown reasons this is happened multiple times on previous admissions.  He was placed in hospital had obtained fair glycemic control as he complained of significant his father she had a swallowing study done by speech therapy as well as an esophagram which revealed minimal acid reflux disease no  significant aspiration noted no evidence of strictures or esophageal dysmotility he was for this he was placed on Protonix 40 mg p.o. daily and had dysphagia 3 diet and seemed to do well his Lantus insulin was titrated up to 16 units at bedtime and he had NovoLog estimated 10 units before meals 3 times daily for all 3 meals he will follow-up in the office to moderate his outpatient insulin and hemodynamics  Hospital Course:  See HPI above  Procedures:     Consultations:    Discharge Instructions  Discharge Instructions    Discharge instructions   Complete by:  As directed      Allergies as of 10/21/2017      Reactions   Metformin And Related Other (See Comments)   Abdominal cramps and diarrhea   Nsaids Nausea Only      Medication List    STOP taking these medications   FLUoxetine 20 MG tablet Commonly known as:  PROZAC Replaced by:  FLUoxetine 20 MG capsule     TAKE these medications   FLUoxetine 20 MG capsule Commonly known as:  PROZAC Take 1 capsule (20 mg total) by mouth daily. Start taking on:  10/22/2017 Replaces:  FLUoxetine 20 MG tablet   gabapentin 300 MG capsule Commonly known as:  NEURONTIN Take 1 capsule by mouth 3 (three) times daily.   glucose blood test strip Commonly known as:  TRUE METRIX BLOOD GLUCOSE TEST Use as instructed   insulin aspart 100 UNIT/ML injection Commonly known as:  NOVOLOG Inject 10 Units into the skin 3 (three) times daily with  meals.   insulin glargine 100 UNIT/ML injection Commonly known as:  LANTUS Inject 0.16 mLs (16 Units total) into the skin at bedtime. What changed:    how much to take  when to take this   Insulin Pen Needle 29G X 12.7MM Misc Commonly known as:  BD ULTRA-FINE PEN NEEDLES 1 each by Does not apply route 4 (four) times daily.   lisinopril 10 MG tablet Commonly known as:  ZESTRIL Take 1 tablet (10 mg total) by mouth daily.   pantoprazole 40 MG tablet Commonly known as:  PROTONIX Take 1 tablet  (40 mg total) by mouth daily at 12 noon.   TRUE METRIX AIR GLUCOSE METER w/Device Kit 1 each by Does not apply route 4 (four) times daily -  before meals and at bedtime.      Allergies  Allergen Reactions  . Metformin And Related Other (See Comments)    Abdominal cramps and diarrhea  . Nsaids Nausea Only      The results of significant diagnostics from this hospitalization (including imaging, microbiology, ancillary and laboratory) are listed below for reference.    Significant Diagnostic Studies: Ct Abdomen Pelvis Wo Contrast  Result Date: 10/16/2017 CLINICAL DATA:  Hyperglycemia and hypotension.  Vomiting. EXAM: CT CHEST, ABDOMEN AND PELVIS WITHOUT CONTRAST TECHNIQUE: Multidetector CT imaging of the chest, abdomen and pelvis was performed following the standard protocol without IV contrast. COMPARISON:  Chest CT April 04, 2016 and CT the abdomen and pelvis February 29, 2016 FINDINGS: CT CHEST FINDINGS Cardiovascular: Minimal coronary artery calcifications. The thoracic aorta is normal in caliber. The pulmonary arteries are unremarkable. The heart size is normal. Mediastinum/Nodes: The thyroid is normal. The esophagus is mildly prominent at and just below the level the carina and appears to contain some fluid. There may be some associated wall thickening as well. The distal esophagus is normal. No adenopathy. No effusions. Lungs/Pleura: Central airways are normal. No pneumothorax. No pulmonary nodules, masses, or infiltrates. Musculoskeletal: No chest wall mass or suspicious bone lesions identified. CT ABDOMEN PELVIS FINDINGS Evaluation of the abdomen limited due to the paucity of intra-abdominal fat and the lack of intravenous and oral contrast. Hepatobiliary: No focal liver abnormality is seen. No gallstones, gallbladder wall thickening, or biliary dilatation. Pancreas: Unremarkable. No pancreatic ductal dilatation or surrounding inflammatory changes. Spleen: Normal in size without focal  abnormality. Adrenals/Urinary Tract: Adrenal glands are normal. No renal stones or perinephric stranding. Pelvicaliectasis bilaterally, left greater than right, is similar since December 2017. No ureteral stones noted. There is a Foley catheter in the bladder but the bladder remains distended. Stomach/Bowel: The stomach is moderately distended with fluid and air. The proximal duodenum is mildly distended as well, particularly the distal second portion as seen on axial image 78. The remainder of the small bowel is unremarkable. The colon is unremarkable. The appendix is not seen but there is no secondary evidence of appendicitis. Vascular/Lymphatic: Mild atherosclerotic change in the nonaneurysmal aorta. No adenopathy. Reproductive: Prostate is unremarkable. Other: No abdominal wall hernia or abnormality. No abdominopelvic ascites. Musculoskeletal: No acute or significant osseous findings. IMPRESSION: 1. There is apparent prominence of the mid esophagus near the level of the carina. The esophagus appears to contain fluid at this level and may be thick walled. Esophagitis is a possibility. Neoplasm not excluded on this study but probably less likely. This could be further evaluated with an upper GI or endoscopy. 2. Moderate distention of the stomach and proximal duodenum of uncertain etiology. This could be related  to the patient's symptoms of vomiting. The remainder of the small bowel and colon are normal. The appendix is not seen but there is no secondary evidence of appendicitis. 3. Mild pelvicaliectasis bilaterally is stable since 2017 and not acute. 4. Mild distention the bladder despite a Foley catheter. 5. Mild coronary artery calcifications. Mild atherosclerotic changes in the abdominal aorta. Electronically Signed   By: Dorise Bullion III M.D   On: 10/16/2017 19:30   Ct Chest Wo Contrast  Result Date: 10/16/2017 CLINICAL DATA:  Hyperglycemia and hypotension.  Vomiting. EXAM: CT CHEST, ABDOMEN AND PELVIS  WITHOUT CONTRAST TECHNIQUE: Multidetector CT imaging of the chest, abdomen and pelvis was performed following the standard protocol without IV contrast. COMPARISON:  Chest CT April 04, 2016 and CT the abdomen and pelvis February 29, 2016 FINDINGS: CT CHEST FINDINGS Cardiovascular: Minimal coronary artery calcifications. The thoracic aorta is normal in caliber. The pulmonary arteries are unremarkable. The heart size is normal. Mediastinum/Nodes: The thyroid is normal. The esophagus is mildly prominent at and just below the level the carina and appears to contain some fluid. There may be some associated wall thickening as well. The distal esophagus is normal. No adenopathy. No effusions. Lungs/Pleura: Central airways are normal. No pneumothorax. No pulmonary nodules, masses, or infiltrates. Musculoskeletal: No chest wall mass or suspicious bone lesions identified. CT ABDOMEN PELVIS FINDINGS Evaluation of the abdomen limited due to the paucity of intra-abdominal fat and the lack of intravenous and oral contrast. Hepatobiliary: No focal liver abnormality is seen. No gallstones, gallbladder wall thickening, or biliary dilatation. Pancreas: Unremarkable. No pancreatic ductal dilatation or surrounding inflammatory changes. Spleen: Normal in size without focal abnormality. Adrenals/Urinary Tract: Adrenal glands are normal. No renal stones or perinephric stranding. Pelvicaliectasis bilaterally, left greater than right, is similar since December 2017. No ureteral stones noted. There is a Foley catheter in the bladder but the bladder remains distended. Stomach/Bowel: The stomach is moderately distended with fluid and air. The proximal duodenum is mildly distended as well, particularly the distal second portion as seen on axial image 78. The remainder of the small bowel is unremarkable. The colon is unremarkable. The appendix is not seen but there is no secondary evidence of appendicitis. Vascular/Lymphatic: Mild  atherosclerotic change in the nonaneurysmal aorta. No adenopathy. Reproductive: Prostate is unremarkable. Other: No abdominal wall hernia or abnormality. No abdominopelvic ascites. Musculoskeletal: No acute or significant osseous findings. IMPRESSION: 1. There is apparent prominence of the mid esophagus near the level of the carina. The esophagus appears to contain fluid at this level and may be thick walled. Esophagitis is a possibility. Neoplasm not excluded on this study but probably less likely. This could be further evaluated with an upper GI or endoscopy. 2. Moderate distention of the stomach and proximal duodenum of uncertain etiology. This could be related to the patient's symptoms of vomiting. The remainder of the small bowel and colon are normal. The appendix is not seen but there is no secondary evidence of appendicitis. 3. Mild pelvicaliectasis bilaterally is stable since 2017 and not acute. 4. Mild distention the bladder despite a Foley catheter. 5. Mild coronary artery calcifications. Mild atherosclerotic changes in the abdominal aorta. Electronically Signed   By: Dorise Bullion III M.D   On: 10/16/2017 19:30   Dg Esophagus  Result Date: 10/18/2017 CLINICAL DATA:  Chest pain with swallowing, dysphagia, odynophagia, diabetes mellitus EXAM: ESOPHOGRAM / BARIUM SWALLOW / BARIUM TABLET STUDY TECHNIQUE: Combined double contrast and single contrast examination performed using effervescent crystals, thick barium  liquid, and thin barium liquid. The patient was observed with fluoroscopy swallowing a 13 mm barium sulphate tablet. Abbreviated exam due to patient condition, with AP view of swallowing and RAO prone imaging not performed. Patient experienced significant chest pain after swallowing the effervescent granules. FLUOROSCOPY TIME:  Fluoroscopy Time:  1 minutes 48 seconds Radiation Exposure Index (if provided by the fluoroscopic device): 19.8 mGy Number of Acquired Spot Images: None COMPARISON:  None  FINDINGS: Esophageal distention: Normal without mass or stricture Filling defects:  None 12.5 mm barium tablet: Passed from oral cavity to stomach without delay Motility:  Grossly normal Mucosa: Question subtle mucosal irregularity at the distal esophagus raising question of esophagitis. Hypopharynx/cervical esophagus: Laryngeal penetration and silent aspiration of a small amount of contrast material Hiatal hernia:  Absent GE reflux:  Not witnessed during exam Other:  N/A IMPRESSION: Laryngeal penetration and minimal aspiration of contrast without spontaneous cough reflex. Question subtle mucosal irregularity at the distal esophagus raising question of esophagitis. Electronically Signed   By: Lavonia Dana M.D.   On: 10/18/2017 16:01   Dg Chest Port 1 View  Result Date: 10/16/2017 CLINICAL DATA:  Acute chest pain. EXAM: PORTABLE CHEST 1 VIEW COMPARISON:  06/16/2017 FINDINGS: The cardiomediastinal silhouette is unremarkable. There is no evidence of focal airspace disease, pulmonary edema, suspicious pulmonary nodule/mass, pleural effusion, or pneumothorax. No acute bony abnormalities are identified. IMPRESSION: No active disease. Electronically Signed   By: Margarette Canada M.D.   On: 10/16/2017 17:02    Microbiology: Recent Results (from the past 240 hour(s))  Blood culture (routine x 2)     Status: None   Collection Time: 10/16/17  4:52 PM  Result Value Ref Range Status   Specimen Description BLOOD LEFT ARM BOTTLES DRAWN AEROBIC AND ANAEROBIC  Final   Special Requests Blood Culture adequate volume  Final   Culture   Final    NO GROWTH 5 DAYS Performed at [facility], [address] [place], [place] [address]    Report Status 10/21/2017 FINAL  Final  Blood culture (routine x 2)     Status: None   Collection Time: 10/16/17  4:54 PM  Result Value Ref Range Status   Specimen Description   Final    BLOOD RIGHT HAND BOTTLES DRAWN AEROBIC AND ANAEROBIC   Special Requests Blood Culture adequate volume   Final   Culture   Final    NO GROWTH 5 DAYS Performed at [facility], [address] [place], [place] [address]    Report Status 10/21/2017 FINAL  Final  MRSA PCR Screening     Status: None   Collection Time: 10/16/17  8:22 PM  Result Value Ref Range Status   MRSA by PCR NEGATIVE NEGATIVE Final    Comment:        The GeneXpert MRSA Assay (FDA approved for NASAL specimens only), is one component of a comprehensive MRSA colonization surveillance program. It is not intended to diagnose MRSA infection nor to guide or monitor treatment for MRSA infections. Performed at [facility], [address] [place], [place] [address]      Labs: Basic Metabolic Panel: Recent Labs  Lab 10/17/17 0301 10/17/17 1456 10/18/17 0559 10/19/17 0543 10/20/17 0418 10/21/17 0435  NA 144 137 137 140 138 134*  K 3.7 3.9 3.6 3.8 4.5 4.7  CL 117* 108 104 102 99 96*  CO2 _0 33* 33* 32  GLUCOSE 136* 186* 167* 63* 241* 240*  BUN 26* _1 14  16  CREATININE 1.40* 0.89 0.82 0.60* 0.69 0.78  CALCIUM 7.5* 7.9* 7.9* 8.6* 8.8* 9.0  MG 2.0  --   --   --   --   --    Liver Function Tests: Recent Labs  Lab 10/16/17 1614  AST 23  ALT 26  ALKPHOS 68  BILITOT 2.2*  PROT 6.1*  ALBUMIN 3.4*   Recent Labs  Lab 10/16/17 1614  LIPASE 49   No results for input(s): AMMONIA in the last 168 hours. CBC: Recent Labs  Lab 10/16/17 1614 10/17/17 0301 10/20/17 0418  WBC 16.2* 11.2* 4.2  NEUTROABS 14.0* 8.5* 2.1  HGB 13.3 12.1* 12.3*  HCT 43.5 34.8* 37.1*  MCV 105.8* 92.3 95.4  PLT 287 211 142*   Cardiac Enzymes: Recent Labs  Lab 10/16/17 1614  TROPONINI <0.03   BNP: BNP (last 3 results) No results for input(s): BNP in the last 8760 hours.  ProBNP (last 3 results) No results for input(s): PROBNP in the last 8760 hours.  CBG: Recent Labs  Lab 10/20/17 1102 10/20/17 1620 10/20/17 2219 10/21/17 0754 10/21/17 1140  GLUCAP 342* 280* 320* 316* 408*        Signed:  Andreia Gandolfi M   Pager: [phone] 10/21/2017, 11:53 AM

## 2017-10-21 NOTE — Progress Notes (Signed)
Pt CBG 408. MD notified and received verbal order to administer 9 units per sliding scale coverage. Will continue to monitor patient

## 2017-10-21 NOTE — Progress Notes (Signed)
Pt's IV catheter removed and intact. Pt's IV site clean dry and intact. Discharge instructions including medications and follow up appointments were reviewed and discussed with patient. All questions were answered and no further questions at this time. Pt in stable condition and in no acute distress at time of discharge. Pt escorted by RN. 

## 2018-05-02 ENCOUNTER — Emergency Department (HOSPITAL_COMMUNITY): Payer: Medicaid Other

## 2018-05-02 ENCOUNTER — Other Ambulatory Visit: Payer: Self-pay

## 2018-05-02 ENCOUNTER — Inpatient Hospital Stay (HOSPITAL_COMMUNITY)
Admission: EM | Admit: 2018-05-02 | Discharge: 2018-05-04 | DRG: 637 | Disposition: A | Discharge status: Home / Self Care | Payer: Medicaid Other | Attending: Family Medicine | Admitting: Family Medicine

## 2018-05-02 ENCOUNTER — Encounter (HOSPITAL_COMMUNITY): Payer: Self-pay | Admitting: *Deleted

## 2018-05-02 DIAGNOSIS — D72829 Elevated white blood cell count, unspecified: Secondary | ICD-10-CM | POA: Diagnosis present

## 2018-05-02 DIAGNOSIS — F1410 Cocaine abuse, uncomplicated: Secondary | ICD-10-CM | POA: Diagnosis present

## 2018-05-02 DIAGNOSIS — R6510 Systemic inflammatory response syndrome (SIRS) of non-infectious origin without acute organ dysfunction: Secondary | ICD-10-CM | POA: Diagnosis present

## 2018-05-02 DIAGNOSIS — Z681 Body mass index (BMI) 19 or less, adult: Secondary | ICD-10-CM

## 2018-05-02 DIAGNOSIS — E876 Hypokalemia: Secondary | ICD-10-CM | POA: Diagnosis present

## 2018-05-02 DIAGNOSIS — G629 Polyneuropathy, unspecified: Secondary | ICD-10-CM | POA: Diagnosis not present

## 2018-05-02 DIAGNOSIS — A419 Sepsis, unspecified organism: Secondary | ICD-10-CM

## 2018-05-02 DIAGNOSIS — J069 Acute upper respiratory infection, unspecified: Secondary | ICD-10-CM | POA: Diagnosis present

## 2018-05-02 DIAGNOSIS — Z590 Homelessness: Secondary | ICD-10-CM

## 2018-05-02 DIAGNOSIS — E43 Unspecified severe protein-calorie malnutrition: Secondary | ICD-10-CM | POA: Diagnosis present

## 2018-05-02 DIAGNOSIS — R6520 Severe sepsis without septic shock: Secondary | ICD-10-CM

## 2018-05-02 DIAGNOSIS — Z888 Allergy status to other drugs, medicaments and biological substances status: Secondary | ICD-10-CM

## 2018-05-02 DIAGNOSIS — F17210 Nicotine dependence, cigarettes, uncomplicated: Secondary | ICD-10-CM | POA: Diagnosis present

## 2018-05-02 DIAGNOSIS — Z794 Long term (current) use of insulin: Secondary | ICD-10-CM | POA: Diagnosis not present

## 2018-05-02 DIAGNOSIS — E1110 Type 2 diabetes mellitus with ketoacidosis without coma: Secondary | ICD-10-CM | POA: Diagnosis present

## 2018-05-02 DIAGNOSIS — Z79899 Other long term (current) drug therapy: Secondary | ICD-10-CM

## 2018-05-02 DIAGNOSIS — Z833 Family history of diabetes mellitus: Secondary | ICD-10-CM

## 2018-05-02 DIAGNOSIS — E1040 Type 1 diabetes mellitus with diabetic neuropathy, unspecified: Secondary | ICD-10-CM | POA: Diagnosis present

## 2018-05-02 DIAGNOSIS — N179 Acute kidney failure, unspecified: Secondary | ICD-10-CM | POA: Diagnosis present

## 2018-05-02 DIAGNOSIS — I959 Hypotension, unspecified: Secondary | ICD-10-CM | POA: Diagnosis present

## 2018-05-02 DIAGNOSIS — Z23 Encounter for immunization: Secondary | ICD-10-CM | POA: Diagnosis not present

## 2018-05-02 DIAGNOSIS — E1010 Type 1 diabetes mellitus with ketoacidosis without coma: Secondary | ICD-10-CM | POA: Diagnosis present

## 2018-05-02 DIAGNOSIS — Z91148 Patient's other noncompliance with medication regimen for other reason: Secondary | ICD-10-CM

## 2018-05-02 DIAGNOSIS — Z9114 Patient's other noncompliance with medication regimen: Secondary | ICD-10-CM | POA: Diagnosis not present

## 2018-05-02 DIAGNOSIS — T383X6A Underdosing of insulin and oral hypoglycemic [antidiabetic] drugs, initial encounter: Secondary | ICD-10-CM | POA: Diagnosis present

## 2018-05-02 LAB — COMPREHENSIVE METABOLIC PANEL
ALT: 43 U/L (ref 0–44)
AST: 31 U/L (ref 15–41)
Albumin: 4.4 g/dL (ref 3.5–5.0)
Alkaline Phosphatase: 79 U/L (ref 38–126)
Anion gap: >20 — ABNORMAL HIGH (ref 5–15)
BUN: 44 mg/dL — AB (ref 6–20)
CO2: <7 mmol/L — ABNORMAL LOW (ref 22–32)
Calcium: 8.9 mg/dL (ref 8.9–10.3)
Chloride: 80 mmol/L — ABNORMAL LOW (ref 98–111)
Creatinine, Ser: 2.68 mg/dL — ABNORMAL HIGH (ref 0.61–1.24)
GFR calc Af Amer: 30 mL/min — ABNORMAL LOW (ref >60)
GFR calc non Af Amer: 26 mL/min — ABNORMAL LOW (ref >60)
Glucose, Bld: 1296 mg/dL (ref 70–99)
Potassium: 6.7 mmol/L (ref 3.5–5.1)
Sodium: 124 mmol/L — ABNORMAL LOW (ref 135–145)
Total Bilirubin: 1.7 mg/dL — ABNORMAL HIGH (ref 0.3–1.2)
Total Protein: 7.3 g/dL (ref 6.5–8.1)

## 2018-05-02 LAB — CBC WITH DIFFERENTIAL/PLATELET
Abs Immature Granulocytes: 0.41 10*3/uL — ABNORMAL HIGH (ref 0.00–0.07)
Basophils Absolute: 0.1 10*3/uL (ref 0.0–0.1)
Basophils Relative: 1 %
Eosinophils Absolute: 0 10*3/uL (ref 0.0–0.5)
Eosinophils Relative: 0 %
HEMATOCRIT: 48.1 % (ref 39.0–52.0)
Hemoglobin: 14.1 g/dL (ref 13.0–17.0)
Immature Granulocytes: 2 %
LYMPHS ABS: 0.8 10*3/uL (ref 0.7–4.0)
Lymphocytes Relative: 4 %
MCH: 31.9 pg (ref 26.0–34.0)
MCHC: 29.3 g/dL — ABNORMAL LOW (ref 30.0–36.0)
MCV: 108.8 fL — AB (ref 80.0–100.0)
Monocytes Absolute: 1.8 10*3/uL — ABNORMAL HIGH (ref 0.1–1.0)
Monocytes Relative: 9 %
Neutro Abs: 18.4 10*3/uL — ABNORMAL HIGH (ref 1.7–7.7)
Neutrophils Relative %: 84 %
Platelets: 260 10*3/uL (ref 150–400)
RBC: 4.42 MIL/uL (ref 4.22–5.81)
RDW: 13 % (ref 11.5–15.5)
WBC: 21.5 10*3/uL — ABNORMAL HIGH (ref 4.0–10.5)
nRBC: 0 % (ref 0.0–0.2)

## 2018-05-02 LAB — CBG MONITORING, ED
Glucose-Capillary: >600 mg/dL (ref 70–99)
Glucose-Capillary: >600 mg/dL (ref 70–99)
Glucose-Capillary: >600 mg/dL (ref 70–99)
Glucose-Capillary: >600 mg/dL (ref 70–99)

## 2018-05-02 LAB — INFLUENZA PANEL BY PCR (TYPE A & B)
Influenza A By PCR: NEGATIVE
Influenza B By PCR: NEGATIVE

## 2018-05-02 LAB — ETHANOL

## 2018-05-02 LAB — BLOOD GAS, VENOUS
ACID-BASE DEFICIT: 26.4 mmol/L — AB (ref 0.0–2.0)
FIO2: 21
O2 Saturation: 81.7 %
Patient temperature: 37
pCO2, Ven: 21.7 mmHg — ABNORMAL LOW (ref 44.0–60.0)
pH, Ven: <6.8 — CL (ref 7.250–7.430)
pO2, Ven: 73.3 mmHg — ABNORMAL HIGH (ref 32.0–45.0)

## 2018-05-02 LAB — BASIC METABOLIC PANEL
BUN: 43 mg/dL — ABNORMAL HIGH (ref 6–20)
CO2: <7 mmol/L — ABNORMAL LOW (ref 22–32)
Calcium: 7.7 mg/dL — ABNORMAL LOW (ref 8.9–10.3)
Chloride: 93 mmol/L — ABNORMAL LOW (ref 98–111)
Creatinine, Ser: 2.37 mg/dL — ABNORMAL HIGH (ref 0.61–1.24)
GFR calc Af Amer: 35 mL/min — ABNORMAL LOW (ref >60)
GFR calc non Af Amer: 30 mL/min — ABNORMAL LOW (ref >60)
Glucose, Bld: 1106 mg/dL (ref 70–99)
Potassium: 5.3 mmol/L — ABNORMAL HIGH (ref 3.5–5.1)
Sodium: 131 mmol/L — ABNORMAL LOW (ref 135–145)

## 2018-05-02 LAB — LACTIC ACID, PLASMA
Lactic Acid, Venous: 7.9 mmol/L (ref 0.5–1.9)
Lactic Acid, Venous: 5.8 mmol/L (ref 0.5–1.9)

## 2018-05-02 LAB — BLOOD GAS, ARTERIAL
Acid-base deficit: 25.7 mmol/L — ABNORMAL HIGH (ref 0.0–2.0)
Bicarbonate: 6.3 mmol/L — ABNORMAL LOW (ref 20.0–28.0)
Drawn by: 419771
FIO2: 21
O2 Saturation: 96.8 %
Patient temperature: 36.4
pCO2 arterial: 15.6 mmHg — CL (ref 32.0–48.0)
pH, Arterial: 6.99 — CL (ref 7.350–7.450)
pO2, Arterial: 142 mmHg — ABNORMAL HIGH (ref 83.0–108.0)

## 2018-05-02 LAB — GLUCOSE, CAPILLARY: Glucose-Capillary: >600 mg/dL (ref 70–99)

## 2018-05-02 LAB — LIPASE, BLOOD: Lipase: 89 U/L — ABNORMAL HIGH (ref 11–51)

## 2018-05-02 MED ORDER — ACETAMINOPHEN 650 MG RE SUPP: 650.0000 mg | Freq: Four times a day (QID) | RECTAL | Status: DC | PRN

## 2018-05-02 MED ORDER — SODIUM BICARBONATE 8.4 % IV SOLN
50.0000 meq | INTRAVENOUS | Status: AC
  Administered 2018-05-02 – 2018-05-03 (×2): 50 meq via INTRAVENOUS
  Filled 2018-05-02 (×2): qty 50

## 2018-05-02 MED ORDER — METRONIDAZOLE IN NACL 5-0.79 MG/ML-% IV SOLN
500.0000 mg | Freq: Three times a day (TID) | INTRAVENOUS | Status: DC
  Administered 2018-05-02: 500 mg via INTRAVENOUS
  Filled 2018-05-02: qty 100

## 2018-05-02 MED ORDER — SODIUM CHLORIDE 0.9 % IV BOLUS
1000.0000 mL | Freq: Once | INTRAVENOUS | Status: AC
  Administered 2018-05-02: 1000 mL via INTRAVENOUS

## 2018-05-02 MED ORDER — INSULIN REGULAR(HUMAN) IN NACL 100-0.9 UT/100ML-% IV SOLN
INTRAVENOUS | Status: DC
  Administered 2018-05-03: 3.6 [IU]/h via INTRAVENOUS
  Administered 2018-05-03: 27.1 [IU]/h via INTRAVENOUS
  Filled 2018-05-02 (×2): qty 100

## 2018-05-02 MED ORDER — VANCOMYCIN HCL IN DEXTROSE 750-5 MG/150ML-% IV SOLN: 750.0000 mg | INTRAVENOUS | Status: DC

## 2018-05-02 MED ORDER — TRAMADOL HCL 50 MG PO TABS: 50.0000 mg | ORAL_TABLET | Freq: Three times a day (TID) | ORAL | Status: DC | PRN

## 2018-05-02 MED ORDER — ONDANSETRON HCL 4 MG/2ML IJ SOLN
4.0000 mg | Freq: Once | INTRAMUSCULAR | Status: AC
  Administered 2018-05-02: 4 mg via INTRAVENOUS
  Filled 2018-05-02: qty 2

## 2018-05-02 MED ORDER — PNEUMOCOCCAL VAC POLYVALENT 25 MCG/0.5ML IJ INJ
0.5000 mL | INJECTION | INTRAMUSCULAR | Status: DC
  Filled 2018-05-02: qty 0.5

## 2018-05-02 MED ORDER — SODIUM CHLORIDE 0.9 % IV BOLUS (SEPSIS)
1000.0000 mL | Freq: Once | INTRAVENOUS | Status: AC
  Administered 2018-05-02: 1000 mL via INTRAVENOUS

## 2018-05-02 MED ORDER — INSULIN REGULAR(HUMAN) IN NACL 100-0.9 UT/100ML-% IV SOLN
INTRAVENOUS | Status: DC
  Administered 2018-05-02: 5.4 [IU]/h via INTRAVENOUS
  Filled 2018-05-02: qty 100

## 2018-05-02 MED ORDER — VANCOMYCIN HCL IN DEXTROSE 1-5 GM/200ML-% IV SOLN
1000.0000 mg | Freq: Once | INTRAVENOUS | Status: AC
  Administered 2018-05-03: 1000 mg via INTRAVENOUS
  Filled 2018-05-02: qty 200

## 2018-05-02 MED ORDER — ACETAMINOPHEN 325 MG PO TABS: 650.0000 mg | ORAL_TABLET | Freq: Four times a day (QID) | ORAL | Status: DC | PRN

## 2018-05-02 MED ORDER — INFLUENZA VAC SPLIT QUAD 0.5 ML IM SUSY
0.5000 mL | PREFILLED_SYRINGE | INTRAMUSCULAR | Status: AC
  Administered 2018-05-03: 0.5 mL via INTRAMUSCULAR
  Filled 2018-05-02: qty 0.5

## 2018-05-02 MED ORDER — SODIUM CHLORIDE 0.9 % IV SOLN
1.0000 g | Freq: Two times a day (BID) | INTRAVENOUS | Status: DC
  Administered 2018-05-03 (×2): 1 g via INTRAVENOUS
  Filled 2018-05-02 (×5): qty 1

## 2018-05-02 MED ORDER — SODIUM BICARBONATE 8.4 % IV SOLN: 100.0000 meq | Freq: Once | INTRAVENOUS | Status: DC

## 2018-05-02 MED ORDER — SODIUM CHLORIDE 0.9 % IV SOLN: 1000.0000 mL | INTRAVENOUS | Status: DC

## 2018-05-02 MED ORDER — SODIUM BICARBONATE 8.4 % IV SOLN
50.0000 meq | Freq: Once | INTRAVENOUS | Status: AC
  Administered 2018-05-02: 50 meq via INTRAVENOUS
  Filled 2018-05-02: qty 50

## 2018-05-02 MED ORDER — SODIUM CHLORIDE 0.9 % IV SOLN
2.0000 g | Freq: Once | INTRAVENOUS | Status: AC
  Administered 2018-05-02: 2 g via INTRAVENOUS
  Filled 2018-05-02: qty 2

## 2018-05-02 MED ORDER — DEXTROSE-NACL 5-0.45 % IV SOLN
INTRAVENOUS | Status: DC
  Administered 2018-05-03: 05:00:00 via INTRAVENOUS

## 2018-05-02 MED ORDER — SODIUM CHLORIDE 0.9 % IV SOLN
1000.0000 mL | INTRAVENOUS | Status: DC
  Administered 2018-05-02: 1000 mL via INTRAVENOUS

## 2018-05-02 MED ORDER — ENOXAPARIN SODIUM 40 MG/0.4ML ~~LOC~~ SOLN
40.0000 mg | SUBCUTANEOUS | Status: DC
  Filled 2018-05-02: qty 0.4

## 2018-05-02 MED ORDER — ONDANSETRON HCL 4 MG PO TABS: 4.0000 mg | ORAL_TABLET | Freq: Four times a day (QID) | ORAL | Status: DC | PRN

## 2018-05-02 MED ORDER — ONDANSETRON HCL 4 MG/2ML IJ SOLN: 4.0000 mg | Freq: Four times a day (QID) | INTRAMUSCULAR | Status: DC | PRN

## 2018-05-02 MED ORDER — POLYETHYLENE GLYCOL 3350 17 G PO PACK: 17.0000 g | PACK | Freq: Every day | ORAL | Status: DC | PRN

## 2018-05-02 MED ORDER — GABAPENTIN 300 MG PO CAPS
300.0000 mg | ORAL_CAPSULE | Freq: Every day | ORAL | Status: DC
  Administered 2018-05-03 (×2): 300 mg via ORAL
  Filled 2018-05-02 (×2): qty 1

## 2018-05-02 MED ORDER — SODIUM CHLORIDE 0.9 % IV SOLN
INTRAVENOUS | Status: DC
  Administered 2018-05-03: 01:00:00 via INTRAVENOUS

## 2018-05-02 NOTE — H&P (Addendum)
History and Physical    Kenneth RainwaterRandy C Ernsberger ZOX:[id number] DOB: 08/06/1965 DOA: 05/02/2018  PCP: Oval Linseyondiego, [name], MD   Patient coming from: Home  I have personally briefly reviewed patient's [facility]  Chief Complaint: Nausea and vomiting weakness  HPI: Kenneth RainwaterRandy C Zilberman is a 53 y.o. male with medical history significant for DM 2 with multiple admissions for DKA secondary to noncompliance, hypothyroidism, cocaine abuse, homelessness who came to the ED with complaints of nausea, vomiting over the past few days, with weakness and lightheadedness with subsequent falls twice today.  He reports large volumes of urine over the past few days, none since admission. He admits he has not been compliant with his Lantus, but  That he has been taking his NovoLog. He reports pain all over his body, and chills.   ED Course: hypotensive blood pressure systolic down to 81/1980/46, 2 L bolus given.  Leukocytosis 21.  Marked hyperglycemia 1296, with anion gap greater than 20, low bicarb less than 7, creatinine elevated 2.68.  Compensatory low sodium 124.  Hyperkalemia 6.7.  UA pending.  Portable chest x-ray negative for acute abnormality.  Insulin GTT started.  Blood cultures obtained.  Started on empiric antibiotics for possible sepsis with vancomycin and cefepime.  Hospitalist to admit for DKA.  Review of Systems: As per HPI all other systems reviewed and negative.  Past Medical History:  Diagnosis Date  . Arthritis   . Chronic generalized pain   . Diabetes mellitus   . DKA (diabetic ketoacidosis) (HCC) 07/2014  . Malnutrition (HCC)   . Neuropathy   . Noncompliance with medication regimen   . Thyroid disease    History reviewed. No pertinent surgical history.   reports that he has been smoking. He has a 4.00 pack-year smoking history. He has never used smokeless tobacco. He reports that he does not drink alcohol or use drugs.  Allergies  Allergen Reactions  . Metformin And Related  Other (See Comments)    Abdominal cramps and diarrhea  . Nsaids Nausea Only    Family History  Problem Relation Age of Onset  . Diabetes Mother   . Seizures Sister   . Diabetes Sister     Prior to Admission medications   Medication Sig Start Date End Date Taking? Authorizing Provider  gabapentin (NEURONTIN) 300 MG capsule Take 300 mg by mouth 4 (four) times daily.  03/25/16  Yes [provider]  insulin aspart (NOVOLOG) 100 UNIT/ML injection Inject 10 Units into the skin 3 (three) times daily with meals. Patient taking differently: Inject 12 Units into the skin 3 (three) times daily with meals.  10/21/17 05/02/18 Yes [name], [name], MD  insulin glargine (LANTUS) 100 UNIT/ML injection Inject 0.16 mLs (16 Units total) into the skin at bedtime. 10/21/17  Yes [name], Gerlene Burdockichard, MD  lisinopril (ZESTRIL) 10 MG tablet Take 1 tablet (10 mg total) by mouth daily. 06/25/16  Yes Oval Linseyondiego, [name], MD    Physical Exam: Vitals:   05/02/18 1838 05/02/18 1839 05/02/18 1930 05/02/18 2000  BP: (!) 88/53 (!) 84/58 (!) 80/46 (!) 97/54  Pulse: 94 94    Resp: (!) 21 (!) 22 (!) 22 (!) 23  Temp: (!) 97.5 F (36.4 C)     TempSrc: Axillary     SpO2: 100% 100%    Weight:      Height:        Constitutional: NAD, calm, comfortable Vitals:   05/02/18 1838 05/02/18 1839 05/02/18 1930 05/02/18 2000  BP: Marland Kitchen(!)  88/53 (!) 84/58 (!) 80/46 (!) 97/54  Pulse: 94 94    Resp: (!) 21 (!) 22 (!) 22 (!) 23  Temp: (!) 97.5 F (36.4 C)     TempSrc: Axillary     SpO2: 100% 100%    Weight:      Height:       Eyes: PERRL, lids and conjunctivae normal ENMT: Mucous membranes are very dry. Posterior pharynx clear of any exudate or lesions.poor dentition Neck: normal, supple, no masses, no thyromegaly Respiratory: clear to auscultation bilaterally, no wheezing, no crackles. Normal respiratory effort. No accessory muscle use.  Cardiovascular: Regular rate and rhythm, no murmurs / rubs / gallops. No extremity edema.  2+ pedal pulses. No carotid bruits.  Abdomen: no tenderness, no masses palpated. No hepatosplenomegaly. Bowel sounds positive.  Musculoskeletal: no clubbing / cyanosis. No joint deformity upper and lower extremities. Good ROM, no contractures. Normal muscle tone.  Skin: no rashes, lesions, ulcers. No induration Neurologic: CN 2-12 grossly intact.  Strength 5/5 in all 4.  Psychiatric: Normal judgment and insight. Alert and oriented x 3. Normal mood.   Labs on Admission: I have personally reviewed following labs and imaging studies  CBC: Recent Labs  Lab 05/02/18 1920  WBC 21.5*  NEUTROABS 18.4*  HGB 14.1  HCT 48.1  MCV 108.8*  PLT 260   Basic Metabolic Panel: Recent Labs  Lab 05/02/18 1920  NA 124*  K 6.7*  CL 80*  CO2 <7*  GLUCOSE 1,296*  BUN 44*  CREATININE 2.68*  CALCIUM 8.9   Liver Function Tests: Recent Labs  Lab 05/02/18 1920  AST 31  ALT 43  ALKPHOS 79  BILITOT 1.7*  PROT 7.3  ALBUMIN 4.4   Recent Labs  Lab 05/02/18 1920  LIPASE 89*   CBG: Recent Labs  Lab 05/02/18 1843 05/02/18 2024  GLUCAP >600* >600*    Radiological Exams on Admission: Dg Chest Portable 1 View  Result Date: 05/02/2018 CLINICAL DATA:  Dyspnea and pain all over EXAM: PORTABLE CHEST 1 VIEW COMPARISON:  10/16/2017 CXR FINDINGS: The heart size and mediastinal contours are within normal limits. Both lungs are clear. The visualized skeletal structures are unremarkable. IMPRESSION: No active disease. Electronically Signed   By: Tollie Eth M.D.   On: 05/02/2018 19:16    EKG: Independently reviewed.  Sinus rhythm.  QTc 491.  Assessment/Plan Active Problems:   DKA (diabetic ketoacidoses) (HCC)   DKA -severe, with marked hyperglycemia 1296, likely component of hyperglycemic hyperosmolar state.  With marked anion gap metabolic acidosis-anion gap 37, hypotension systolic 80s, marked lactic acidosis greater than 7.9, AKI creatinine 2.68.  2 L bolus, 50 mEq bicarb given, insulin GTT  started in ED. Negative due to influenza panel.  At this time no focus of infection identified, DKA likely secondary to noncompliance history and prior admissions for same. -ABG- PH- 6.9, Pco2- 15.6.  -Aggressive hydration, repeat bolus, now 4L  - N/s 150cc/hr X 15hrs -with marked acidosis, will repeat bicarb, 50 mEq x 2 (I cannot order bicarb GTT without dextrose) -Trend lactic acid -Follow-up UA, obtain urine cultures, in and out cath -Follow-up blood cultures, -Considering Marked leukocytosis, hypotension, severe lactic acidosis will continue broad-spectrum antibiotics at this time Vanc and cefepime, consider DC pending clinical course -BMP every 4 hourly X 3, monitor K -Continue insulin GTT, per DKA protocol - Hgba1c  Acute kidney injury- cr 2.68.  Baseline 0.6-0.7. -Hydrate -BMP every 4 hourly  Lactic acidosis- 7.9, likely from intravascular contraction and poor tissue  perfusion.  - Doubt infectious etiology at this time, but will cont broad spectrum antibiotics for now. - Trend lactic acid  Hyperkalemia-6.7 -Monitor closely, may need K supplementation  Pseudohyponatremia-compensation for hyperglycemia, corrected sodium 143. - Monitor  Homelessnesss, substance abuse issues-  -UDS -Alcohol level   DVT prophylaxis: Lovenox Code Status: Full Family Communication: None at bedside Disposition Plan: Per rounding team Consults called:  None Admission status: Inpt, Step down   Onnie Boer MD [facility]  05/02/2018, 9:07 PM

## 2018-05-02 NOTE — ED Provider Notes (Signed)
[facility] EMERGENCY DEPARTMENT Provider Note   CSN: [id number] Arrival date & time: 05/02/18  1831     History   Chief Complaint Chief Complaint  Patient presents with  . Hyperglycemia    HPI Kenneth [name] is a 53 y.o. male.  HPI Patient presents to the emergency room for evaluation of elevated blood sugar associated with nausea and vomiting and weakness.  Patient has a history of diabetes.  He also has a history of DKA.  Patient is concerned that he may be in DKA again.  Patient states his blood sugar is chronically elevated and it was elevated again today.  He has been taking his insulin and last took it this afternoon.  He had multiple episodes of nausea and vomiting.  He has felt weak.  He is aching all over but denies any fevers.  Denies any chest pain or shortness of breath.  Past Medical History:  Diagnosis Date  . Arthritis   . Chronic generalized pain   . Diabetes mellitus   . DKA (diabetic ketoacidosis) (HCC) 07/2014  . Malnutrition (HCC)   . Neuropathy   . Noncompliance with medication regimen   . Thyroid disease     Patient Active Problem List   Diagnosis Date Noted  . Malnutrition of moderate degree 10/19/2017  . Severe sepsis with septic shock (CODE) (HCC) 10/16/2017  . Acute renal injury (HCC) 10/16/2017  . Esophagitis 10/16/2017  . Hypotension 06/16/2017  . Cocaine abuse (HCC)   . Homeless   . Lactic acidosis   . DKA, type 2 (HCC) 06/22/2016  . Non compliance w medication regimen   . Protein-calorie malnutrition, severe 06/11/2016  . Chronic generalized pain 04/26/2016  . Neuropathy 04/26/2016  . Diabetes mellitus type 2, uncontrolled, with complications (HCC)   . 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 Depression, recurrent (HCC)   . Abdominal pain   . Hyperglycemia 02/24/2016  . Volume depletion 12/21/2015  . Metabolic acidosis 12/21/2015  . Diabetes mellitus with nonketotic hyperosmolarity  (HCC) 09/21/2014  . Noncompliance with medication regimen   . Diabetes mellitus (HCC)   . DKA (diabetic ketoacidoses) (HCC) 07/24/2014  . Dehydration 07/24/2014  . Acute renal failure (HCC) 07/24/2014  . Hyperkalemia 07/24/2014  . Hyponatremia 07/24/2014  . Hyperthyroidism 07/24/2014    History reviewed. No pertinent surgical history.      Home Medications    Prior to Admission medications   Medication Sig Start Date End Date Taking? Authorizing Provider  gabapentin (NEURONTIN) 300 MG capsule Take 300 mg by mouth 4 (four) times daily.  03/25/16  Yes [provider]  insulin aspart (NOVOLOG) 100 UNIT/ML injection Inject 10 Units into the skin 3 (three) times daily with meals. Patient taking differently: Inject 12 Units into the skin 3 (three) times daily with meals.  10/21/17 05/02/18 Yes [name], [name], MD  insulin glargine (LANTUS) 100 UNIT/ML injection Inject 0.16 mLs (16 Units total) into the skin at bedtime. 10/21/17  Yes [name], Gerlene Burdock, MD  lisinopril (ZESTRIL) 10 MG tablet Take 1 tablet (10 mg total) by mouth daily. 06/25/16  Yes Oval Linsey, MD    Family History Family History  Problem Relation Age of Onset  . Diabetes Mother   . Seizures Sister   . Diabetes Sister     Social History Social History   Tobacco Use  . Smoking status: Current Every Day Smoker    Packs/day: 0.50    Years: 8.00    Pack years:  4.00  . Smokeless tobacco: Never Used  Substance Use Topics  . Alcohol use: No  . Drug use: No     Allergies   Metformin and related and Nsaids   Review of Systems Review of Systems  All other systems reviewed and are negative.    Physical Exam Updated Vital Signs BP (!) 97/54   Pulse 94   Temp (!) 97.5 F (36.4 C) (Axillary)   Resp (!) 23   Ht 1.753 m (5\' 9" )   Wt 63.5 kg   SpO2 100%   BMI 20.67 kg/m   Physical Exam Vitals signs and nursing note reviewed.  Constitutional:      General: He is not in acute distress.     Appearance: He is well-developed. He is ill-appearing.  HENT:     Head: Normocephalic and atraumatic.     Right Ear: External ear normal.     Left Ear: External ear normal.  Eyes:     General: No scleral icterus.       Right eye: No discharge.        Left eye: No discharge.     Conjunctiva/sclera: Conjunctivae normal.  Neck:     Musculoskeletal: Neck supple.     Trachea: No tracheal deviation.  Cardiovascular:     Rate and Rhythm: Normal rate and regular rhythm.  Pulmonary:     Effort: Pulmonary effort is normal. No respiratory distress.     Breath sounds: Normal breath sounds. No stridor. No wheezing or rales.  Abdominal:     General: Bowel sounds are normal. There is no distension.     Palpations: Abdomen is soft.     Tenderness: There is no abdominal tenderness. There is no guarding or rebound.  Musculoskeletal:        General: No tenderness.  Skin:    General: Skin is warm and dry.     Findings: No rash.  Neurological:     Mental Status: He is alert.     Cranial Nerves: No cranial nerve deficit (no facial droop, extraocular movements intact, no slurred speech).     Sensory: No sensory deficit.     Motor: No abnormal muscle tone or seizure activity.     Coordination: Coordination normal.      ED Treatments / Results  Labs (all labs ordered are listed, but only abnormal results are displayed) Labs Reviewed  BLOOD GAS, VENOUS - Abnormal; Notable for the following components:      Result Value   pH, Ven <6.800 (*)    pCO2, Ven 21.7 (*)    pO2, Ven 73.3 (*)    Acid-base deficit 26.4 (*)    All other components within normal limits  CBC WITH DIFFERENTIAL/PLATELET - Abnormal; Notable for the following components:   WBC 21.5 (*)    MCV 108.8 (*)    MCHC 29.3 (*)    Neutro Abs 18.4 (*)    Monocytes Absolute 1.8 (*)    Abs Immature Granulocytes 0.41 (*)    All other components within normal limits  LACTIC ACID, PLASMA - Abnormal; Notable for the following components:    Lactic Acid, Venous 7.9 (*)    All other components within normal limits  COMPREHENSIVE METABOLIC PANEL - Abnormal; Notable for the following components:   Sodium 124 (*)    Potassium 6.7 (*)    Chloride 80 (*)    CO2 <7 (*)    Glucose, Bld 1,296 (*)    BUN 44 (*)  Creatinine, Ser 2.68 (*)    Total Bilirubin 1.7 (*)    GFR calc non Af Amer 26 (*)    GFR calc Af Amer 30 (*)    Anion gap >20 (*)    All other components within normal limits  LIPASE, BLOOD - Abnormal; Notable for the following components:   Lipase 89 (*)    All other components within normal limits  CBG MONITORING, ED - Abnormal; Notable for the following components:   Glucose-Capillary >600 (*)    All other components within normal limits  CBG MONITORING, ED - Abnormal; Notable for the following components:   Glucose-Capillary >600 (*)    All other components within normal limits  CULTURE, BLOOD (ROUTINE X 2)  CULTURE, BLOOD (ROUTINE X 2)  URINE CULTURE  URINALYSIS, ROUTINE W REFLEX MICROSCOPIC  LACTIC ACID, PLASMA  INFLUENZA PANEL BY PCR (TYPE A & B)  RAPID URINE DRUG SCREEN, HOSP PERFORMED    EKG EKG Interpretation  Date/Time:  Tuesday May 02 2018 18:37:40 EST Ventricular Rate:  95 PR Interval:    QRS Duration: 111 QT Interval:  392 QTC Calculation: 491 R Axis:   139 Text Interpretation:  Sinus rhythm Anterolateral infarct, old Baseline wander in lead(s) V6 No significant change since last tracing Confirmed by Linwood Dibbles [phone]) on 05/02/2018 7:02:11 PM   Radiology Dg Chest Portable 1 View  Result Date: 05/02/2018 CLINICAL DATA:  Dyspnea and pain all over EXAM: PORTABLE CHEST 1 VIEW COMPARISON:  10/16/2017 CXR FINDINGS: The heart size and mediastinal contours are within normal limits. Both lungs are clear. The visualized skeletal structures are unremarkable. IMPRESSION: No active disease. Electronically Signed   By: Tollie Eth M.D.   On: 05/02/2018 19:16    Procedures .Critical  Care Performed by: Linwood Dibbles, MD Authorized by: Linwood Dibbles, MD   Critical care provider statement:    Critical care time (minutes):  45   Critical care was time spent personally by me on the following activities:  Discussions with consultants, evaluation of patient's response to treatment, examination of patient, ordering and performing treatments and interventions, ordering and review of laboratory studies, ordering and review of radiographic studies, pulse oximetry, re-evaluation of patient's condition, obtaining history from patient or surrogate and review of old charts   (including critical care time)  Medications Ordered in ED Medications  insulin regular, human (MYXREDLIN) 100 units/ 100 mL infusion (5.4 Units/hr Intravenous New Bag/Given 05/02/18 2028)  sodium chloride 0.9 % bolus 1,000 mL (1,000 mLs Intravenous New Bag/Given 05/02/18 2102)  sodium chloride 0.9 % bolus 1,000 mL (1,000 mLs Intravenous New Bag/Given 05/02/18 1929)    Followed by  sodium chloride 0.9 % bolus 1,000 mL (1,000 mLs Intravenous New Bag/Given 05/02/18 1930)    Followed by  0.9 %  sodium chloride infusion (1,000 mLs Intravenous New Bag/Given 05/02/18 2031)  ceFEPIme (MAXIPIME) 2 g in sodium chloride 0.9 % 100 mL IVPB (2 g Intravenous New Bag/Given 05/02/18 2102)  metroNIDAZOLE (FLAGYL) IVPB 500 mg (has no administration in time range)  vancomycin (VANCOCIN) IVPB 1000 mg/200 mL premix (has no administration in time range)  vancomycin (VANCOCIN) IVPB 750 mg/150 ml premix (has no administration in time range)  ceFEPIme (MAXIPIME) 1 g in sodium chloride 0.9 % 100 mL IVPB (has no administration in time range)  ondansetron (ZOFRAN) injection 4 mg (4 mg Intravenous Given 05/02/18 1930)  sodium bicarbonate injection 50 mEq (50 mEq Intravenous Given 05/02/18 2031)     Initial Impression / Assessment and  Plan / ED Course  I have reviewed the triage vital signs and the nursing notes.  Pertinent labs & imaging results that  were available during my care of the patient were reviewed by me and considered in my medical decision making (see chart for details).  Clinical Course as of May 02 2109  Tue May 02, 2018  2005 Initial labs show venous pH of 6.8.  This is consistent with severe acidosis symptoms most likely related to DKA.  Electrolyte panel still pending.  Continue with IV fluids.  I will give a dose of sodium bicarbonate.  IV insulin infusion ordered.   [JK]  2014 Blood pressure is noted to be in the high 90s now.  He is responding to IV fluid treatment.   [JK]  2015 Now states his chest hurts.  EKG and CXR without acute findings.  Likely related to his vomiting.  Will continue to monitor   [JK]  2018 Notified that lactic acid is elevated.  Suspect due to dehydration and his vomiting rather than infection as their is no clear source but will start on abx   [JK]  2021 Hyperkalemia noted.  Insulin infusion should correct the hyperkalemia   [JK]    Clinical Course User Index [JK] Linwood DibblesKnapp, Kiri Hinderliter, MD    Patient presented to the emergency room with nausea vomiting and hyperglycemia.  Patient has known history of DKA with frequent exacerbations.  ED work-up was notable for significant hyperglycemia as well as acute kidney injury and an anion gap metabolic acidosis.  Patient does have a significant leukocytosis but I suspect this may be stress demargination.  Rule out sepsis so the patient was started on empiric antibiotics.  Blood pressure has improved with hydration.  He was given sodium bicarb for his extreme acidosis.  IV insulin infusion started for the DKA and hyperkalemia.  Patient continues to improve.  Will admit to the hospital for close monitoring and further treatment.  Final Clinical Impressions(s) / ED Diagnoses   Final diagnoses:  Diabetic ketoacidosis without coma associated with type 1 diabetes mellitus (HCC)      Linwood DibblesKnapp, Evamae Rowen, MD 05/02/18 2112

## 2018-05-02 NOTE — ED Notes (Signed)
CRITICAL VALUE ALERT  Critical Value:  Venous pH 6.882  Date & Time Notied:  05/02/2018 1945  Provider Notified: dr.knapp  Orders Received/Actions taken: md notified

## 2018-05-02 NOTE — ED Notes (Signed)
CRITICAL VALUE ALERT  Critical Value:  Potassium 6.7                         Lactic acid 7.9  Date & Time Notied:  05/02/2018 2018  Provider Notified: dr.knapp  Orders Received/Actions taken: md notified

## 2018-05-02 NOTE — ED Notes (Signed)
CRITICAL VALUE ALERT  Critical Value:  Glucose 1106  Date & Time Notied:  05/02/2018 2244  Provider Notified: Dr. Mariea ClontsEmokpae  Orders Received/Actions taken: See chart

## 2018-05-02 NOTE — ED Triage Notes (Signed)
Pt brought in by [facility] with c/o pain all over and SOB. Pt reports he has been dizzy and has been falling at home. Pt was found to be hyperglycemic with EMS. Pt's blood sugar was 587 at home and was given 10-12 units of Novolog. EMS found blood sugar to be 432 and was given 500cc of NS. BP 105/58, HR 93, sat 95% RA for EMS. Pt also c/o severe headache.

## 2018-05-02 NOTE — ED Notes (Signed)
CRITICAL VALUE ALERT  Critical Value:  Glucose 1,296  Date & Time Notied:  05/02/2018 2030  Provider Notified: dr.knapp  Orders Received/Actions taken: md notified

## 2018-05-02 NOTE — ED Notes (Signed)
CRITICAL VALUE ALERT  Critical Value:  pH <7.200                          PCO2 <20.0  Date & Time Notied:  05/02/2018 2152  Provider Notified: dr.knapp  Orders Received/Actions taken: md notified

## 2018-05-02 NOTE — Progress Notes (Signed)
Pharmacy Antibiotic Note  Kenneth [name] is a 53 y.o. male admitted on 05/02/2018 with sepsis.  Pharmacy has been consulted for cefepime and vancomycin dosing.  Plan: Vancomycin 750mg  IV every 24 hours.  Goal trough 15-20 mcg/mL. cefepime 1gm iv q12h  Height: 5\' 9"  (175.3 cm) Weight: 140 lb (63.5 kg) IBW/kg (Calculated) : 70.7  Temp (24hrs), Avg:97.5 F (36.4 C), Min:97.5 F (36.4 C), Max:97.5 F (36.4 C)  Recent Labs  Lab 05/02/18 1920 05/02/18 1922  WBC 21.5*  --   CREATININE 2.68*  --   LATICACIDVEN  --  7.9*    Estimated Creatinine Clearance: 28.3 mL/min (A) (by C-G formula based on SCr of 2.68 mg/dL (H)).    Allergies  Allergen Reactions  . Metformin And Related Other (See Comments)    Abdominal cramps and diarrhea  . Nsaids Nausea Only    Antimicrobials this admission: 2/11 cefepime >>  2/11 vancomycin >>  2/11 metronidazole >>  Microbiology results: 2/11 BCx: sent   Thank you for allowing pharmacy to be a part of this patient's care.  Kenneth [name] 05/02/2018 8:44 PM

## 2018-05-03 ENCOUNTER — Encounter (HOSPITAL_COMMUNITY): Payer: Self-pay | Admitting: Family Medicine

## 2018-05-03 ENCOUNTER — Inpatient Hospital Stay (HOSPITAL_COMMUNITY): Payer: Medicaid Other

## 2018-05-03 DIAGNOSIS — I959 Hypotension, unspecified: Secondary | ICD-10-CM

## 2018-05-03 DIAGNOSIS — N179 Acute kidney failure, unspecified: Secondary | ICD-10-CM

## 2018-05-03 DIAGNOSIS — E43 Unspecified severe protein-calorie malnutrition: Secondary | ICD-10-CM

## 2018-05-03 DIAGNOSIS — F1410 Cocaine abuse, uncomplicated: Secondary | ICD-10-CM

## 2018-05-03 DIAGNOSIS — G629 Polyneuropathy, unspecified: Secondary | ICD-10-CM

## 2018-05-03 DIAGNOSIS — Z9114 Patient's other noncompliance with medication regimen: Secondary | ICD-10-CM

## 2018-05-03 LAB — RAPID URINE DRUG SCREEN, HOSP PERFORMED
Amphetamines: NOT DETECTED
BARBITURATES: NOT DETECTED
Benzodiazepines: NOT DETECTED
COCAINE: POSITIVE — AB
Opiates: NOT DETECTED
Tetrahydrocannabinol: NOT DETECTED

## 2018-05-03 LAB — BASIC METABOLIC PANEL
Anion gap: 21 — ABNORMAL HIGH (ref 5–15)
Anion gap: 10 (ref 5–15)
BUN: 38 mg/dL — ABNORMAL HIGH (ref 6–20)
BUN: 28 mg/dL — ABNORMAL HIGH (ref 6–20)
CO2: 13 mmol/L — AB (ref 22–32)
CO2: 23 mmol/L (ref 22–32)
Calcium: 7.3 mg/dL — ABNORMAL LOW (ref 8.9–10.3)
Calcium: 7.4 mg/dL — ABNORMAL LOW (ref 8.9–10.3)
Chloride: 103 mmol/L (ref 98–111)
Chloride: 104 mmol/L (ref 98–111)
Creatinine, Ser: 1.9 mg/dL — ABNORMAL HIGH (ref 0.61–1.24)
Creatinine, Ser: 1.15 mg/dL (ref 0.61–1.24)
GFR calc Af Amer: 45 mL/min — ABNORMAL LOW (ref >60)
GFR calc Af Amer: >60 mL/min (ref >60)
GFR calc non Af Amer: >60 mL/min (ref >60)
GFR, EST NON AFRICAN AMERICAN: 39 mL/min — AB (ref >60)
GLUCOSE: 622 mg/dL — AB (ref 70–99)
Glucose, Bld: 187 mg/dL — ABNORMAL HIGH (ref 70–99)
Potassium: 3.5 mmol/L (ref 3.5–5.1)
Potassium: 2.7 mmol/L — CL (ref 3.5–5.1)
Sodium: 137 mmol/L (ref 135–145)
Sodium: 137 mmol/L (ref 135–145)

## 2018-05-03 LAB — GLUCOSE, CAPILLARY
GLUCOSE-CAPILLARY: 138 mg/dL — AB (ref 70–99)
GLUCOSE-CAPILLARY: 218 mg/dL — AB (ref 70–99)
Glucose-Capillary: 330 mg/dL — ABNORMAL HIGH (ref 70–99)
Glucose-Capillary: 497 mg/dL — ABNORMAL HIGH (ref 70–99)
Glucose-Capillary: 511 mg/dL (ref 70–99)
Glucose-Capillary: >600 mg/dL (ref 70–99)
Glucose-Capillary: 218 mg/dL — ABNORMAL HIGH (ref 70–99)
Glucose-Capillary: 174 mg/dL — ABNORMAL HIGH (ref 70–99)
Glucose-Capillary: 147 mg/dL — ABNORMAL HIGH (ref 70–99)
Glucose-Capillary: 110 mg/dL — ABNORMAL HIGH (ref 70–99)
Glucose-Capillary: 206 mg/dL — ABNORMAL HIGH (ref 70–99)
Glucose-Capillary: 114 mg/dL — ABNORMAL HIGH (ref 70–99)

## 2018-05-03 LAB — URINALYSIS, ROUTINE W REFLEX MICROSCOPIC
Bacteria, UA: NONE SEEN
Bilirubin Urine: NEGATIVE
Glucose, UA: >=500 mg/dL — AB
Ketones, ur: 80 mg/dL — AB
Leukocytes,Ua: NEGATIVE
Nitrite: NEGATIVE
Protein, ur: NEGATIVE mg/dL
Specific Gravity, Urine: 1.015 (ref 1.005–1.030)
pH: 5 (ref 5.0–8.0)

## 2018-05-03 LAB — CBC
HEMATOCRIT: 33.9 % — AB (ref 39.0–52.0)
Hemoglobin: 11.4 g/dL — ABNORMAL LOW (ref 13.0–17.0)
MCH: 31.3 pg (ref 26.0–34.0)
MCHC: 33.6 g/dL (ref 30.0–36.0)
MCV: 93.1 fL (ref 80.0–100.0)
Platelets: 185 10*3/uL (ref 150–400)
RBC: 3.64 MIL/uL — ABNORMAL LOW (ref 4.22–5.81)
RDW: 12.3 % (ref 11.5–15.5)
WBC: 14.2 10*3/uL — ABNORMAL HIGH (ref 4.0–10.5)
nRBC: 0 % (ref 0.0–0.2)

## 2018-05-03 LAB — MRSA PCR SCREENING: MRSA by PCR: NEGATIVE

## 2018-05-03 LAB — MAGNESIUM: MAGNESIUM: 1.9 mg/dL (ref 1.7–2.4)

## 2018-05-03 MED ORDER — PHENOL 1.4 % MT LIQD
1.0000 | OROMUCOSAL | Status: DC | PRN
  Filled 2018-05-03: qty 177

## 2018-05-03 MED ORDER — OXYCODONE-ACETAMINOPHEN 5-325 MG PO TABS
1.0000 | ORAL_TABLET | Freq: Four times a day (QID) | ORAL | Status: DC | PRN
  Administered 2018-05-03 – 2018-05-04 (×5): 1 via ORAL
  Filled 2018-05-03 (×4): qty 1

## 2018-05-03 MED ORDER — INSULIN ASPART 100 UNIT/ML ~~LOC~~ SOLN: 0.0000 [IU] | Freq: Every day | SUBCUTANEOUS | Status: DC

## 2018-05-03 MED ORDER — PNEUMOCOCCAL VAC POLYVALENT 25 MCG/0.5ML IJ INJ
0.5000 mL | INJECTION | INTRAMUSCULAR | Status: AC
  Administered 2018-05-04: 0.5 mL via INTRAMUSCULAR
  Filled 2018-05-03: qty 0.5

## 2018-05-03 MED ORDER — INSULIN ASPART 100 UNIT/ML ~~LOC~~ SOLN
0.0000 [IU] | Freq: Three times a day (TID) | SUBCUTANEOUS | Status: DC
  Administered 2018-05-03 (×2): 3 [IU] via SUBCUTANEOUS
  Administered 2018-05-04: 2 [IU] via SUBCUTANEOUS

## 2018-05-03 MED ORDER — INSULIN GLARGINE 100 UNIT/ML ~~LOC~~ SOLN
40.0000 [IU] | SUBCUTANEOUS | Status: DC
  Administered 2018-05-03: 40 [IU] via SUBCUTANEOUS
  Filled 2018-05-03 (×2): qty 0.4

## 2018-05-03 MED ORDER — POTASSIUM CHLORIDE 10 MEQ/100ML IV SOLN
10.0000 meq | INTRAVENOUS | Status: AC
  Administered 2018-05-03 (×3): 10 meq via INTRAVENOUS
  Filled 2018-05-03 (×3): qty 100

## 2018-05-03 MED ORDER — ALUM & MAG HYDROXIDE-SIMETH 200-200-20 MG/5ML PO SUSP
30.0000 mL | ORAL | Status: DC | PRN
  Filled 2018-05-03: qty 30

## 2018-05-03 MED ORDER — KCL IN DEXTROSE-NACL 20-5-0.9 MEQ/L-%-% IV SOLN
INTRAVENOUS | Status: DC
  Administered 2018-05-03: 09:00:00 via INTRAVENOUS

## 2018-05-03 MED ORDER — INSULIN GLARGINE 100 UNIT/ML ~~LOC~~ SOLN
30.0000 [IU] | SUBCUTANEOUS | Status: DC
  Filled 2018-05-03: qty 0.3

## 2018-05-03 MED ORDER — POTASSIUM CHLORIDE IN NACL 20-0.9 MEQ/L-% IV SOLN
INTRAVENOUS | Status: DC
  Administered 2018-05-03 – 2018-05-04 (×2): via INTRAVENOUS

## 2018-05-03 MED ORDER — INSULIN ASPART 100 UNIT/ML ~~LOC~~ SOLN
10.0000 [IU] | Freq: Three times a day (TID) | SUBCUTANEOUS | Status: DC
  Administered 2018-05-03 – 2018-05-04 (×2): 10 [IU] via SUBCUTANEOUS

## 2018-05-03 NOTE — Progress Notes (Signed)
Inpatient Diabetes Program Recommendations  AACE/ADA: New Consensus Statement on Inpatient Glycemic Control (2015)  Target Ranges:  Prepandial:   less than 140 mg/dL      Peak postprandial:   less than 180 mg/dL (1-2 hours)      Critically ill patients:  140 - 180 mg/dL   Lab Results  Component Value Date   GLUCAP 206 (H) 05/03/2018   HGBA1C 11.2 (H) 10/16/2017    Review of Glycemic ControlResults for GAY, PEETERS (MRN [id number]) as of 05/03/2018 13:32  Ref. Range 05/03/2018 08:56 05/03/2018 11:33  Glucose-Capillary Latest Ref Range: 70 - 99 mg/dL 782 (H) 956 (H)    Diabetes history: DM 2 Outpatient Diabetes medications: Lantus 16 units q HS, Novolog 10 units tid with meal Current orders for Inpatient glycemic control:  Lantus 40 units q HS, Novolog 10 units tid with meals, Novolog sensitive tid with meals  Inpatient Diabetes Program Recommendations:    Patient admitted with DKA. Transitioned off insulin drip this morning.  He was in the hospital last year in both March and July of 2018 with DKA and hyperglycemia.  Spoke with RN and she states that patient has requested "home health" to help him with his diabetes.  He has Medicaid for medication coverage.  He is scheduled to transfer to floor today.  Asked RN to pass on in report that patient needs to practice giving injections to assess his ability and provide survival skill DM education.  Will f/u on 2/13.  Thanks  Beryl Meager, RN, BC-ADM Inpatient Diabetes Coordinator Pager [phone] (8a-5p)

## 2018-05-03 NOTE — Progress Notes (Signed)
CRITICAL VALUE ALERT  Critical Value:  K 2.7  Date & Time Notied:  05-03-2018 4854  Provider Notified: [place]  Orders Received/Actions taken: awaiting orders

## 2018-05-03 NOTE — Progress Notes (Addendum)
PROGRESS NOTE    Kenneth RainwaterRandy C [name]  XWR:[id number]  DOB: 07/05/1965  DOA: 05/02/2018 PCP: Oval Linseyondiego, [name], MD  Brief Admission Hx: 53 year old male with type 1 diabetes mellitus on insulin and with past medical history of homelessness and substance abuse presented to the emergency department and was admitted with severe diabetic ketoacidosis.  MDM/Assessment & Plan:   1. Diabetic ketoacidosis- patient has been treated in the stepdown unit with an IV insulin infusion and the anion gap has closed.  The patient's blood glucose is now down to 100-200 range.  He is mentating well.  He has no nausea or vomiting.  He has been transitioned over to subcutaneous insulin.  He is being monitored and educated by the diabetes education team. 2. Type 1 diabetes mellitus-hemoglobin A1c greater than 11% which indicates poor glycemic control.  Patient has not been caring for his diabetes likely due to substance abuse.  I have asked the social workers and care management team to meet with the patient and to offer support and assistance for managing diabetes.  The patient says that he has his insulin supplies however I worry that with him being homeless is not able to take care of his insulin and keep it at the proper temperature. 3. Hypotension-treated with IV fluid hydration which is improving his blood pressures. 4. Hypokalemia-IV replacement ordered.  Continue to follow BMP. 5. Lactic acidosis- improved with aggressive treatments. 6. Acute kidney injury- prerenal and responding well to IV fluid hydration. 7. Substance abuse and homelessness- consulted social worker and care management team. 8. Leukocytosis-WBC trending down with treatment.  Follow.  Follow urine cultures and blood cultures.  No signs or symptoms of infection found.  Order a portable chest x-ray.  DVT prophylaxis: Lovenox Code Status: Full  Family Communication: Patient Disposition Plan: Home in 1 to 2 days  Consultants:  Diabetes  coordinator  Procedures:  N/A  Antimicrobials:  N/A  Subjective: The patient says that he feels hungry and he would like to eat this morning.  Objective: Vitals:   05/03/18 1100 05/03/18 1200 05/03/18 1300 05/03/18 1400  BP: (!) 81/60 102/67 (!) 86/59 (!) 80/60  Pulse: 79 86 80 79  Resp: 14 13 19 14   Temp: 97.8 F (36.6 C)     TempSrc: Oral     SpO2: 99% 100% 100% 100%  Weight:      Height:        Intake/Output Summary (Last 24 hours) at 05/03/2018 1453 Last data filed at 05/03/2018 1004 Gross per 24 hour  Intake 3130.22 ml  Output 1775 ml  Net 1355.22 ml   Filed Weights   05/02/18 1835 05/03/18 0000  Weight: 63.5 kg 54.1 kg   REVIEW OF SYSTEMS  As per history otherwise all reviewed and reported negative  Exam:  General exam: Emaciated, chronically ill-appearing male and poorly groomed.  Dry mucous membranes. Respiratory system: Clear. No increased work of breathing. Cardiovascular system: S1 & S2 heard. No JVD, murmurs, gallops, clicks or pedal edema. Gastrointestinal system: Abdomen is nondistended, soft and nontender. Normal bowel sounds heard. Central nervous system: Alert and oriented. No focal neurological deficits. Extremities: no CCE.  Data Reviewed: Basic Metabolic Panel: Recent Labs  Lab 05/02/18 1920 05/02/18 2159 05/03/18 0123 05/03/18 0547  NA 124* 131* 137 137  K 6.7* 5.3* 3.5 2.7*  CL 80* 93* 103 104  CO2 <7* <7* 13* 23  GLUCOSE 1,296* 1,106* 622* 187*  BUN 44* 43* 38* 28*  CREATININE 2.68* 2.37* 1.90* 1.15  CALCIUM 8.9 7.7* 7.3* 7.4*  MG  --   --  1.9  --    Liver Function Tests: Recent Labs  Lab 05/02/18 1920  AST 31  ALT 43  ALKPHOS 79  BILITOT 1.7*  PROT 7.3  ALBUMIN 4.4   Recent Labs  Lab 05/02/18 1920  LIPASE 89*   No results for input(s): AMMONIA in the last 168 hours. CBC: Recent Labs  Lab 05/02/18 1920 05/03/18 0123  WBC 21.5* 14.2*  NEUTROABS 18.4*  --   HGB 14.1 11.4*  HCT 48.1 33.9*  MCV 108.8*  93.1  PLT 260 185   Cardiac Enzymes: No results for input(s): CKTOTAL, CKMB, CKMBINDEX, TROPONINI in the last 168 hours. CBG (last 3)  Recent Labs    05/03/18 0736 05/03/18 0856 05/03/18 1133  GLUCAP 147* 110* 206*   Recent Results (from the past 240 hour(s))  Blood culture (routine x 2)     Status: None (Preliminary result)   Collection Time: 05/02/18  7:22 PM  Result Value Ref Range Status   Specimen Description RIGHT ANTECUBITAL  Final   Special Requests   Final    BOTTLES DRAWN AEROBIC AND ANAEROBIC Blood Culture adequate volume   Culture   Final    NO GROWTH < 24 HOURS Performed at [facility], [address] [place], [place] [address]    Report Status PENDING  Incomplete  Blood culture (routine x 2)     Status: None (Preliminary result)   Collection Time: 05/02/18  7:26 PM  Result Value Ref Range Status   Specimen Description BLOOD RIGHT FOREARM  Final   Special Requests   Final    BOTTLES DRAWN AEROBIC AND ANAEROBIC Blood Culture adequate volume   Culture   Final    NO GROWTH < 24 HOURS Performed at [facility], [address] [place], [place] [address]    Report Status PENDING  Incomplete  MRSA PCR Screening     Status: None   Collection Time: 05/02/18 11:26 PM  Result Value Ref Range Status   MRSA by PCR NEGATIVE NEGATIVE Final    Comment:        The GeneXpert MRSA Assay (FDA approved for NASAL specimens only), is one component of a comprehensive MRSA colonization surveillance program. It is not intended to diagnose MRSA infection nor to guide or monitor treatment for MRSA infections. Performed at [facility], [address]  [place], [place] [address]      Studies: Dg Chest Portable 1 View  Result Date: 05/02/2018 CLINICAL DATA:  Dyspnea and pain all over EXAM: PORTABLE CHEST 1 VIEW COMPARISON:  10/16/2017 CXR FINDINGS: The heart size and mediastinal contours are within normal limits. Both lungs are clear. The visualized skeletal structures are  unremarkable. IMPRESSION: No active disease. Electronically Signed   By: Tollie Ethavid  Kwon M.D.   On: 05/02/2018 19:16     Scheduled Meds: . enoxaparin (LOVENOX) injection  40 mg Subcutaneous Q24H  . gabapentin  300 mg Oral QHS  . insulin aspart  0-5 Units Subcutaneous QHS  . insulin aspart  0-9 Units Subcutaneous TID WC  . insulin aspart  10 Units Subcutaneous TID WC  . insulin glargine  40 Units Subcutaneous Q24H  . [START ON 05/04/2018] pneumococcal 23 valent vaccine  0.5 mL Intramuscular Tomorrow-1000   Continuous Infusions: . ceFEPime (MAXIPIME) IV 200 mL/hr at 05/03/18 1004  . dextrose 5 % and 0.9 % NaCl with KCl 20 mEq/L 140 mL/hr at 05/03/18 0845   Principal Problem:  DKA (diabetic ketoacidoses) (HCC) Active Problems:   Neuropathy   Protein-calorie malnutrition, severe   Non compliance w medication regimen   Hypotension   Cocaine abuse (HCC)   AKI (acute kidney injury) (HCC)  Critical care Time spent: 35 minutes  Standley Dakins, MD [facility] 05/03/2018, 2:53 PM    LOS: 1 day  How to contact the [facility] Attending or Consulting provider 7A - 7P or covering provider during after hours 7P -7A, for this patient?  1. Check the care team in [facility] and look for a) attending/consulting [facility] provider listed and b) the [facility] team listed 2. Log into [email or web address] and use [facility] universal password to access. If you do not have the password, please contact the hospital operator. 3. Locate the [facility] provider you are looking for under [facility] and page to a number that you can be directly reached. 4. If you still have difficulty reaching the provider, please page the [facility] (Director on Call) for the Hospitalists listed on amion for assistance.

## 2018-05-04 LAB — BASIC METABOLIC PANEL
ANION GAP: 6 (ref 5–15)
BUN: 15 mg/dL (ref 6–20)
CO2: 25 mmol/L (ref 22–32)
Calcium: 8.3 mg/dL — ABNORMAL LOW (ref 8.9–10.3)
Chloride: 107 mmol/L (ref 98–111)
Creatinine, Ser: 0.64 mg/dL (ref 0.61–1.24)
GFR calc Af Amer: >60 mL/min (ref >60)
GFR calc non Af Amer: >60 mL/min (ref >60)
Glucose, Bld: 110 mg/dL — ABNORMAL HIGH (ref 70–99)
Potassium: 3.5 mmol/L (ref 3.5–5.1)
Sodium: 138 mmol/L (ref 135–145)

## 2018-05-04 LAB — CBC
HCT: 33 % — ABNORMAL LOW (ref 39.0–52.0)
HEMOGLOBIN: 11.5 g/dL — AB (ref 13.0–17.0)
MCH: 32 pg (ref 26.0–34.0)
MCHC: 34.8 g/dL (ref 30.0–36.0)
MCV: 91.9 fL (ref 80.0–100.0)
Platelets: 144 10*3/uL — ABNORMAL LOW (ref 150–400)
RBC: 3.59 MIL/uL — ABNORMAL LOW (ref 4.22–5.81)
RDW: 13 % (ref 11.5–15.5)
WBC: 9 10*3/uL (ref 4.0–10.5)
nRBC: 0 % (ref 0.0–0.2)

## 2018-05-04 LAB — GLUCOSE, CAPILLARY
GLUCOSE-CAPILLARY: 101 mg/dL — AB (ref 70–99)
GLUCOSE-CAPILLARY: 183 mg/dL — AB (ref 70–99)
Glucose-Capillary: 60 mg/dL — ABNORMAL LOW (ref 70–99)
Glucose-Capillary: 77 mg/dL (ref 70–99)
Glucose-Capillary: 184 mg/dL — ABNORMAL HIGH (ref 70–99)

## 2018-05-04 LAB — HEMOGLOBIN A1C
Hgb A1c MFr Bld: 12.7 % — ABNORMAL HIGH (ref 4.8–5.6)
MEAN PLASMA GLUCOSE: 318 mg/dL

## 2018-05-04 LAB — URINE CULTURE: Culture: NO GROWTH

## 2018-05-04 LAB — MAGNESIUM: Magnesium: 1.9 mg/dL (ref 1.7–2.4)

## 2018-05-04 LAB — LIPASE, BLOOD: Lipase: 16 U/L (ref 11–51)

## 2018-05-04 MED ORDER — DOXYCYCLINE HYCLATE 100 MG PO TABS
100.0000 mg | ORAL_TABLET | Freq: Two times a day (BID) | ORAL | Status: DC
  Administered 2018-05-04: 100 mg via ORAL
  Filled 2018-05-04: qty 1

## 2018-05-04 MED ORDER — INSULIN GLARGINE 100 UNIT/ML ~~LOC~~ SOLN
25.0000 [IU] | SUBCUTANEOUS | Status: DC
  Administered 2018-05-04: 25 [IU] via SUBCUTANEOUS
  Filled 2018-05-04 (×2): qty 0.25

## 2018-05-04 MED ORDER — DOXYCYCLINE HYCLATE 100 MG PO TABS: 100.0000 mg | ORAL_TABLET | Freq: Two times a day (BID) | ORAL | 0 refills | Status: AC

## 2018-05-04 MED ORDER — LISINOPRIL 2.5 MG PO TABS: 2.5000 mg | ORAL_TABLET | Freq: Every day | ORAL | 0 refills | Status: AC

## 2018-05-04 MED ORDER — INSULIN GLARGINE 100 UNIT/ML ~~LOC~~ SOLN: 25.0000 [IU] | Freq: Every day | SUBCUTANEOUS | 0 refills | Status: DC

## 2018-05-04 MED ORDER — INSULIN ASPART 100 UNIT/ML ~~LOC~~ SOLN: 8.0000 [IU] | Freq: Three times a day (TID) | SUBCUTANEOUS | 0 refills | Status: DC

## 2018-05-04 MED ORDER — "INSULIN SYRINGE 29G X 1/2"" 0.5 ML MISC": 0 refills | Status: DC

## 2018-05-04 MED ORDER — MAGIC MOUTHWASH
5.0000 mL | Freq: Once | ORAL | Status: AC
  Administered 2018-05-04: 5 mL via ORAL
  Filled 2018-05-04: qty 5

## 2018-05-04 MED ORDER — BLOOD GLUCOSE METER KIT: PACK | 0 refills | Status: DC

## 2018-05-04 NOTE — Discharge Instructions (Signed)
IMPORTANT INFORMATION: PAY CLOSE ATTENTION   PHYSICIAN DISCHARGE INSTRUCTIONS  Follow with Primary care provider  Kenneth Gaskins, MD  and other consultants as instructed your Hospitalist Physician  SEEK MEDICAL CARE OR RETURN TO EMERGENCY ROOM IF SYMPTOMS COME BACK, WORSEN OR NEW PROBLEM DEVELOPS.   Please note: You were cared for by a hospitalist during your hospital stay. Every effort will be made to forward records to your primary care provider.  You can request that your primary care provider send for your hospital records if they have not received them.  Once you are discharged, your primary care physician will handle any further medical issues. Please note that NO REFILLS for any discharge medications will be authorized once you are discharged, as it is imperative that you return to your primary care physician (or establish a relationship with a primary care physician if you do not have one) for your post hospital discharge needs so that they can reassess your need for medications and monitor your lab values.  Please get a complete blood count and chemistry panel checked by your Primary MD at your next visit, and again as instructed by your Primary MD.  Get Medicines reviewed and adjusted: Please take all your medications with you for your next visit with your Primary MD  Laboratory/radiological data: Please request your Primary MD to go over all hospital tests and procedure/radiological results at the follow up, please ask your primary care provider to get all Hospital records sent to his/her office.  In some cases, they will be blood work, cultures and biopsy results pending at the time of your discharge. Please request that your primary care provider follow up on these results.  If you are diabetic, please bring your blood sugar readings with you to your follow up appointment with primary care.    Please call and make your follow up appointments as soon as possible.    Also Note  the following: If you experience worsening of your admission symptoms, develop shortness of breath, life threatening emergency, suicidal or homicidal thoughts you must seek medical attention immediately by calling 911 or calling your MD immediately  if symptoms less severe.  You must read complete instructions/literature along with all the possible adverse reactions/side effects for all the Medicines you take and that have been prescribed to you. Take any new Medicines after you have completely understood and accpet all the possible adverse reactions/side effects.   Do not drive when taking Pain medications or sleeping medications (Benzodiazepines)  Do not take more than prescribed Pain, Sleep and Anxiety Medications. It is not advisable to combine anxiety,sleep and pain medications without talking with your primary care practitioner  Special Instructions: If you have smoked or chewed Tobacco  in the last 2 yrs please stop smoking, stop any regular Alcohol  and or any Recreational drug use.  Wear Seat belts while driving.        Blood Glucose Monitoring, Adult Monitoring your blood sugar (glucose) is an important part of managing your diabetes (diabetes mellitus). Blood glucose monitoring involves checking your blood glucose as often as directed and keeping a record (log) of your results over time. Checking your blood glucose regularly and keeping a blood glucose log can:  Help you and your health care provider adjust your diabetes management plan as needed, including your medicines or insulin.  Help you understand how food, exercise, illnesses, and medicines affect your blood glucose.  Let you know what your blood glucose is at any time. You  can quickly find out if you have low blood glucose (hypoglycemia) or high blood glucose (hyperglycemia). Your health care provider will set individualized treatment goals for you. Your goals will be based on your age, other medical conditions you  have, and how you respond to diabetes treatment. Generally, the goal of treatment is to maintain the following blood glucose levels:  Before meals (preprandial): 80-130 mg/dL (4.4-7.2 mmol/L).  After meals (postprandial): below 180 mg/dL (10 mmol/L).  A1c level: less than 7%. Supplies needed:  Blood glucose meter.  Test strips for your meter. Each meter has its own strips. You must use the strips that came with your meter.  A needle to prick your finger (lancet). Do not use a lancet more than one time.  A device that holds the lancet (lancing device).  A journal or log book to write down your results. How to check your blood glucose  1. Wash your hands with soap and water. 2. Prick the side of your finger (not the tip) with the lancet. Use a different finger each time. 3. Gently rub the finger until a small drop of blood appears. 4. Follow instructions that come with your meter for inserting the test strip, applying blood to the strip, and using your blood glucose meter. 5. Write down your result and any notes. Some meters allow you to use areas of your body other than your finger (alternative sites) to test your blood. The most common alternative sites are:  Forearm.  Thigh.  Palm of the hand. If you think you may have hypoglycemia, or if you have a history of not knowing when your blood glucose is getting low (hypoglycemia unawareness), do not use alternative sites. Use your finger instead. Alternative sites may not be as accurate as the fingers, because blood flow is slower in these areas. This means that the result you get may be delayed, and it may be different from the result that you would get from your finger. Follow these instructions at home: Blood glucose log   Every time you check your blood glucose, write down your result. Also write down any notes about things that may be affecting your blood glucose, such as your diet and exercise for the day. This information can  help you and your health care provider: ? Look for patterns in your blood glucose over time. ? Adjust your diabetes management plan as needed.  Check if your meter allows you to download your records to a computer. Most glucose meters store a record of glucose readings in the meter. If you have type 1 diabetes:  Check your blood glucose 2 or more times a day.  Also check your blood glucose: ? Before every insulin injection. ? Before and after exercise. ? Before meals. ? 2 hours after a meal. ? Occasionally between 2:00 a.m. and 3:00 a.m., as directed. ? Before potentially dangerous tasks, like driving or using heavy machinery. ? At bedtime.  You may need to check your blood glucose more often, up to 6-10 times a day, if you: ? Use an insulin pump. ? Need multiple daily injections (MDI). ? Have diabetes that is not well-controlled. ? Are ill. ? Have a history of severe hypoglycemia. ? Have hypoglycemia unawareness. If you have type 2 diabetes:  If you take insulin or other diabetes medicines, check your blood glucose 2 or more times a day.  If you are on intensive insulin therapy, check your blood glucose 4 or more times a day. Occasionally, you  may also need to check between 2:00 a.m. and 3:00 a.m., as directed.  Also check your blood glucose: ? Before and after exercise. ? Before potentially dangerous tasks, like driving or using heavy machinery.  You may need to check your blood glucose more often if: ? Your medicine is being adjusted. ? Your diabetes is not well-controlled. ? You are ill. General tips  Always keep your supplies with you.  If you have questions or need help, all blood glucose meters have a 24-hour "hotline" phone number that you can call. You may also contact your health care provider.  After you use a few boxes of test strips, adjust (calibrate) your blood glucose meter by following instructions that came with your meter. Contact a health care  provider if:  Your blood glucose is at or above 240 mg/dL (13.3 mmol/L) for 2 days in a row.  You have been sick or have had a fever for 2 days or longer, and you are not getting better.  You have any of the following problems for more than 6 hours: ? You cannot eat or drink. ? You have nausea or vomiting. ? You have diarrhea. Get help right away if:  Your blood glucose is lower than 54 mg/dL (3 mmol/L).  You become confused or you have trouble thinking clearly.  You have difficulty breathing.  You have moderate or large ketone levels in your urine. Summary  Monitoring your blood sugar (glucose) is an important part of managing your diabetes (diabetes mellitus).  Blood glucose monitoring involves checking your blood glucose as often as directed and keeping a record (log) of your results over time.  Your health care provider will set individualized treatment goals for you. Your goals will be based on your age, other medical conditions you have, and how you respond to diabetes treatment.  Every time you check your blood glucose, write down your result. Also write down any notes about things that may be affecting your blood glucose, such as your diet and exercise for the day. This information is not intended to replace advice given to you by your health care provider. Make sure you discuss any questions you have with your health care provider. Document Released: 03/11/2003 Document Revised: 01/17/2017 Document Reviewed: 08/18/2015 Elsevier Interactive Patient Education  2019 [employer]   Diabetic Ketoacidosis Diabetic ketoacidosis is a serious complication of diabetes. This condition develops when there is not enough insulin in the body. Insulin is an hormone that regulates blood sugar levels in the body. Normally, insulin allows glucose to enter the cells in the body. The cells break down glucose for energy. Without enough insulin, the body cannot break down glucose, so it breaks  down fats instead. This leads to high blood glucose levels in the body and the production of acids that are called ketones. Ketones are poisonous at high levels. If diabetic ketoacidosis is not treated, it can cause severe dehydration and can lead to a coma or death. What are the causes? This condition develops when a lack of insulin causes the body to break down fats instead of glucose. This may be triggered by:  Stress on the body. This stress is brought on by an illness.  Infection.  Medicines that raise blood glucose levels.  Not taking diabetes medicine.  New onset of type 1 diabetes mellitus. What are the signs or symptoms? Symptoms of this condition include:  Fatigue.  Weight loss.  Excessive thirst.  Light-headedness.  Fruity or sweet-smelling breath.  Excessive  urination.  Vision changes.  Confusion or irritability.  Nausea.  Vomiting.  Rapid breathing.  Abdominal pain.  Feeling flushed. How is this diagnosed? This condition is diagnosed based on your medical history, a physical exam, and blood tests. You may also have a urine test to check for ketones. How is this treated? This condition may be treated with:  Fluid replacement. This may be done to correct dehydration.  Insulin injections. These may be given through the skin or through an IV tube.  Electrolyte replacement. Electrolytes are minerals in your blood. Electrolytes such as potassium and sodium may be given in pill form or through an IV tube.  Antibiotic medicines. These may be prescribed if your condition was caused by an infection. Diabetic ketoacidosis is a serious medical condition. You may need emergency treatment in the hospital to monitor your condition. Follow these instructions at home: Eating and drinking  Drink enough fluids to keep your urine clear or pale yellow.  If you are not able to eat, drink clear fluids in small amounts as you are able. Clear fluids include water, ice  chips, fruit juice with water added (diluted), and low-calorie sports drinks. You may also have sugar-free jello or popsicles.  If you are able to eat, follow your usual diet and drink sugar-free liquids, such as water. Medicines  Take over-the-counter and prescription medicines only as told by your health care provider.  Continue to take insulin and other diabetes medicines as told by your health care provider.  If you were prescribed an antibiotic, take it as told by your health care provider. Do not stop taking the antibiotic even if you start to feel better. General instructions   Check your urine for ketones when you are ill and as told by your health care provider. ? If your blood glucose is 240 mg/dL (13.3 mmol/L) or higher, check your urine ketones every 4-6 hours.  Check your blood glucose every day, as often as told by your health care provider. ? If your blood glucose is high, drink plenty of fluids. This helps to flush out ketones. ? If your blood glucose is above your target for 2 tests in a row, contact your health care provider.  Carry a medical alert card or wear medical alert jewelry that says that you have diabetes.  Rest and exercise only as told by your health care provider. Do not exercise when your blood glucose is high and you have ketones in your urine.  If you get sick, call your health care provider and begin treatment quickly. Your body often needs extra insulin to fight an illness. Check your blood glucose every 4-6 hours when you are sick.  Keep all follow-up visits as told by your health care provider. This is important. Contact a health care provider if:  Your blood glucose level is higher than 240 mg/dL (13.3 mmol/L) for 2 days in a row.  You have moderate or large ketones in your urine.  You have a fever.  You cannot eat or drink without vomiting.  You have been vomiting for more than 2 hours.  You continue to have symptoms of diabetic  ketoacidosis.  You develop new symptoms. Get help right away if:  Your blood glucose monitor reads high even when you are taking insulin.  You faint.  You have chest pain.  You have trouble breathing.  You have sudden trouble speaking or swallowing.  You have vomiting or diarrhea that gets worse after 3 hours.  You  are unable to stay awake.  You have trouble thinking.  You are severely dehydrated. Symptoms of severe dehydration include: ? Extreme thirst. ? Dry mouth. ? Rapid breathing. These symptoms may represent a serious problem that is an emergency. Do not wait to see if the symptoms will go away. Get medical help right away. Call your local emergency services (911 in the [place] Do not drive yourself to the hospital. Summary  Diabetic ketoacidosis is a serious complication of diabetes. This condition develops when there is not enough insulin in the body.  This condition is diagnosed based on your medical history, a physical exam, and blood tests. You may also have a urine test to check for ketones.  Diabetic ketoacidosis is a serious medical condition. You may need emergency treatment in the hospital to monitor your condition.  Contact your health care provider if your blood glucose is higher than 240 mg/dl for 2 days in a row or if you have moderate or large ketones in your urine. This information is not intended to replace advice given to you by your health care provider. Make sure you discuss any questions you have with your health care provider. Document Released: 03/05/2000 Document Revised: 04/12/2016 Document Reviewed: 04/12/2016 Elsevier Interactive Patient Education  2019 [place].   Diabetes Mellitus and Sick Day Management Blood sugar (glucose) can be difficult to control when you are sick. Common illnesses that can cause problems for people with diabetes (diabetes mellitus) include colds, fever, flu (influenza), nausea, vomiting, and diarrhea. These  illnesses can cause stress and loss of body fluids (dehydration), and those issues can cause blood glucose levels to increase. Because of this, it is very important to take your insulin and diabetes medicines and eat some form of carbohydrate when you are sick. You should make a plan for days when you are sick (sick day plan) as part of your diabetes management plan. You and your health care provider should make this plan in advance. The following guidelines are intended to help you manage an illness that lasts for about 24 hours or less. Your health care provider may also give you more specific instructions. What do I need to do to manage my blood glucose?   Check your blood glucose every 2-4 hours, or as often as told by your health care provider.  Know your sick day treatment goals. Your target blood glucose levels may be different when you are sick.  If you use insulin, take your usual dose. ? If your blood glucose continues to be too high, you may need to take an additional insulin dose as told by your health care provider.  If you use oral diabetes medicine, you may need to stop taking it if you are not able to eat or drink normally. Ask your health care provider about whether you need to stop taking these medicines while you are sick.  If you use injectable hormone medicines other than insulin to control your diabetes, ask your health care provider about whether you need to stop taking these medicines while you are sick. What else can I do to manage my diabetes when I am sick? Check your ketones  If you have type 1 diabetes, check your urine ketones every 4 hours.  If you have type 2 diabetes, check your urine ketones as often as told by your health care provider. Drink fluids  Drink enough fluid to keep your urine clear or pale yellow. This is especially important if you have a fever,  vomiting, or diarrhea. Those symptoms can lead to dehydration.  Follow any instructions from your  health care provider about beverages to avoid. ? Do not drink alcohol, caffeine, or drinks that contain a lot of sugar. Take medicines as directed  Take-over-the-counter and prescription medicines only as told by your health care provider.  Check medicine labels for added sugars. Some medicines may contain sugar or types of sugars that can raise your blood glucose level. What foods can I eat when I am sick?  You need to eat some form of carbohydrates when you are sick. You should eat 45-50 grams (45-50 g) of carbohydrates every 3-4 hours until you feel better. All of the food choices below contain about 15 g of carbohydrates. Plan ahead and keep some of these foods around so you have them if you get sick.  4-6 oz (120-177 mL) carbonated beverage that contains sugar, such as regular (not diet) soda. You may be able to drink carbonated beverages more easily if you open the beverage and let it sit at room temperature for a few minutes before drinking.   of a twin frozen ice pop.  4 oz (120 g) regular gelatin.  4 oz (120 mL) fruit juice.  4 oz (120 g) ice cream or frozen yogurt.  2 oz (60 g) sherbet.  8 oz (240 mL) clear broth or soup.  4 oz (120 g) regular custard.  4 oz (120 g) regular pudding.  8 oz (240 g) plain yogurt.  1 slice bread or toast.  6 saltine crackers.  5 vanilla wafers. Questions to ask your health care provider Consider asking the following questions so you know what to do on days when you are sick:  Should I adjust my diabetes medicines?  How often do I need to check my blood glucose?  What supplies do I need to manage my diabetes at home when I am sick?  What number can I call if I have questions?  What foods and drinks should I avoid? Contact a health care provider if:  You develop symptoms of diabetic ketoacidosis, such as: ? Fatigue. ? Weight loss. ? Excessive thirst. ? Light-headedness. ? Fruity or sweet-smelling breath. ? Excessive  urination. ? Vision changes. ? Confusion or irritability. ? Nausea. ? Vomiting. ? Rapid breathing. ? Pain in the abdomen. ? Feeling flushed.  You are unable to drink fluids without vomiting.  You have any of the following for more than 6 hours: ? Nausea. ? Vomiting. ? Diarrhea.  Your blood glucose is at or above 240 mg/dL (13.3 mmol/L), even after you take an additional insulin dose.  You have a change in how you think, feel, or act (mental status).  You develop another serious illness.  You have been sick or have had a fever for 2 days or longer and you are not getting better. Get help right away if:  Your blood glucose is lower than 54 mg/dL (3.0 mmol/L).  You have difficulty breathing.  You have moderate or high ketone levels in your urine.  You used emergency glucagon to treat low blood glucose. Summary  Blood sugar (glucose) can be difficult to control when you are sick. Common illnesses that can cause problems for people with diabetes (diabetes mellitus) include colds, fever, flu (influenza), nausea, vomiting, and diarrhea.  Illnesses can cause stress and loss of body fluids (dehydration), and those issues can cause blood glucose levels to increase.  Make a plan for days when you are sick (sick day  plan) as part of your diabetes management plan. You and your health care provider should make this plan in advance.  It is very important to take your insulin and diabetes medicines and to eat some form of carbohydrate when you are sick.  Contact your health care provider if have problems managing your blood glucose levels when you are sick, or if you have been sick or had a fever for 2 days or longer and are not getting better. This information is not intended to replace advice given to you by your health care provider. Make sure you discuss any questions you have with your health care provider. Document Released: 03/11/2003 Document Revised: 12/05/2015 Document Reviewed:  12/05/2015 Elsevier Interactive Patient Education  2019 [place].   Diabetes Mellitus and Exercise Exercising regularly is important for your overall health, especially when you have diabetes (diabetes mellitus). Exercising is not only about losing weight. It has many other health benefits, such as increasing muscle strength and bone density and reducing body fat and stress. This leads to improved fitness, flexibility, and endurance, all of which result in better overall health. Exercise has additional benefits for people with diabetes, including:  Reducing appetite.  Helping to lower and control blood glucose.  Lowering blood pressure.  Helping to control amounts of fatty substances (lipids) in the blood, such as cholesterol and triglycerides.  Helping the body to respond better to insulin (improving insulin sensitivity).  Reducing how much insulin the body needs.  Decreasing the risk for heart disease by: ? Lowering cholesterol and triglyceride levels. ? Increasing the levels of good cholesterol. ? Lowering blood glucose levels. What is my activity plan? Your health care provider or certified diabetes educator can help you make a plan for the type and frequency of exercise (activity plan) that works for you. Make sure that you:  Do at least 150 minutes of moderate-intensity or vigorous-intensity exercise each week. This could be brisk walking, biking, or water aerobics. ? Do stretching and strength exercises, such as yoga or weightlifting, at least 2 times a week. ? Spread out your activity over at least 3 days of the week.  Get some form of physical activity every day. ? Do not go more than 2 days in a row without some kind of physical activity. ? Avoid being inactive for more than 30 minutes at a time. Take frequent breaks to walk or stretch.  Choose a type of exercise or activity that you enjoy, and set realistic goals.  Start slowly, and gradually increase the  intensity of your exercise over time. What do I need to know about managing my diabetes?   Check your blood glucose before and after exercising. ? If your blood glucose is 240 mg/dL (13.3 mmol/L) or higher before you exercise, check your urine for ketones. If you have ketones in your urine, do not exercise until your blood glucose returns to normal. ? If your blood glucose is 100 mg/dL (5.6 mmol/L) or lower, eat a snack containing 15-20 grams of carbohydrate. Check your blood glucose 15 minutes after the snack to make sure that your level is above 100 mg/dL (5.6 mmol/L) before you start your exercise.  Know the symptoms of low blood glucose (hypoglycemia) and how to treat it. Your risk for hypoglycemia increases during and after exercise. Common symptoms of hypoglycemia can include: ? Hunger. ? Anxiety. ? Sweating and feeling clammy. ? Confusion. ? Dizziness or feeling light-headed. ? Increased heart rate or palpitations. ? Blurry vision. ? Tingling  or numbness around the mouth, lips, or tongue. ? Tremors or shakes. ? Irritability.  Keep a rapid-acting carbohydrate snack available before, during, and after exercise to help prevent or treat hypoglycemia.  Avoid injecting insulin into areas of the body that are going to be exercised. For example, avoid injecting insulin into: ? The arms, when playing tennis. ? The legs, when jogging.  Keep records of your exercise habits. Doing this can help you and your health care provider adjust your diabetes management plan as needed. Write down: ? Food that you eat before and after you exercise. ? Blood glucose levels before and after you exercise. ? The type and amount of exercise you have done. ? When your insulin is expected to peak, if you use insulin. Avoid exercising at times when your insulin is peaking.  When you start a new exercise or activity, work with your health care provider to make sure the activity is safe for you, and to adjust  your insulin, medicines, or food intake as needed.  Drink plenty of water while you exercise to prevent dehydration or heat stroke. Drink enough fluid to keep your urine clear or pale yellow. Summary  Exercising regularly is important for your overall health, especially when you have diabetes (diabetes mellitus).  Exercising has many health benefits, such as increasing muscle strength and bone density and reducing body fat and stress.  Your health care provider or certified diabetes educator can help you make a plan for the type and frequency of exercise (activity plan) that works for you.  When you start a new exercise or activity, work with your health care provider to make sure the activity is safe for you, and to adjust your insulin, medicines, or food intake as needed. This information is not intended to replace advice given to you by your health care provider. Make sure you discuss any questions you have with your health care provider. Document Released: 05/29/2003 Document Revised: 09/16/2016 Document Reviewed: 08/18/2015 Elsevier Interactive Patient Education  2019 [place].   Diabetes Mellitus and Nutrition, Adult When you have diabetes (diabetes mellitus), it is very important to have healthy eating habits because your blood sugar (glucose) levels are greatly affected by what you eat and drink. Eating healthy foods in the appropriate amounts, at about the same times every day, can help you:  Control your blood glucose.  Lower your risk of heart disease.  Improve your blood pressure.  Reach or maintain a healthy weight. Every person with diabetes is different, and each person has different needs for a meal plan. Your health care provider may recommend that you work with a diet and nutrition specialist (dietitian) to make a meal plan that is best for you. Your meal plan may vary depending on factors such as:  The calories you need.  The medicines you take.  Your  weight.  Your blood glucose, blood pressure, and cholesterol levels.  Your activity level.  Other health conditions you have, such as heart or kidney disease. How do carbohydrates affect me? Carbohydrates, also called carbs, affect your blood glucose level more than any other type of food. Eating carbs naturally raises the amount of glucose in your blood. Carb counting is a method for keeping track of how many carbs you eat. Counting carbs is important to keep your blood glucose at a healthy level, especially if you use insulin or take certain oral diabetes medicines. It is important to know how many carbs you can safely have in each  meal. This is different for every person. Your dietitian can help you calculate how many carbs you should have at each meal and for each snack. Foods that contain carbs include:  Bread, cereal, rice, pasta, and crackers.  Potatoes and corn.  Peas, beans, and lentils.  Milk and yogurt.  Fruit and juice.  Desserts, such as cakes, cookies, ice cream, and candy. How does alcohol affect me? Alcohol can cause a sudden decrease in blood glucose (hypoglycemia), especially if you use insulin or take certain oral diabetes medicines. Hypoglycemia can be a life-threatening condition. Symptoms of hypoglycemia (sleepiness, dizziness, and confusion) are similar to symptoms of having too much alcohol. If your health care provider says that alcohol is safe for you, follow these guidelines:  Limit alcohol intake to no more than 1 drink per day for nonpregnant women and 2 drinks per day for men. One drink equals 12 oz of beer, 5 oz of wine, or 1 oz of hard liquor.  Do not drink on an empty stomach.  Keep yourself hydrated with water, diet soda, or unsweetened iced tea.  Keep in mind that regular soda, juice, and other mixers may contain a lot of sugar and must be counted as carbs. What are tips for following this plan?  Reading food labels  Start by checking the  serving size on the "Nutrition Facts" label of packaged foods and drinks. The amount of calories, carbs, fats, and other nutrients listed on the label is based on one serving of the item. Many items contain more than one serving per package.  Check the total grams (g) of carbs in one serving. You can calculate the number of servings of carbs in one serving by dividing the total carbs by 15. For example, if a food has 30 g of total carbs, it would be equal to 2 servings of carbs.  Check the number of grams (g) of saturated and trans fats in one serving. Choose foods that have low or no amount of these fats.  Check the number of milligrams (mg) of salt (sodium) in one serving. Most people should limit total sodium intake to less than 2,300 mg per day.  Always check the nutrition information of foods labeled as "low-fat" or "nonfat". These foods may be higher in added sugar or refined carbs and should be avoided.  Talk to your dietitian to identify your daily goals for nutrients listed on the label. Shopping  Avoid buying canned, premade, or processed foods. These foods tend to be high in fat, sodium, and added sugar.  Shop around the outside edge of the grocery store. This includes fresh fruits and vegetables, bulk grains, fresh meats, and fresh dairy. Cooking  Use low-heat cooking methods, such as baking, instead of high-heat cooking methods like deep frying.  Cook using healthy oils, such as olive, canola, or sunflower oil.  Avoid cooking with butter, cream, or high-fat meats. Meal planning  Eat meals and snacks regularly, preferably at the same times every day. Avoid going long periods of time without eating.  Eat foods high in fiber, such as fresh fruits, vegetables, beans, and whole grains. Talk to your dietitian about how many servings of carbs you can eat at each meal.  Eat 4-6 ounces (oz) of lean protein each day, such as lean meat, chicken, fish, eggs, or tofu. One oz of lean  protein is equal to: ? 1 oz of meat, chicken, or fish. ? 1 egg. ?  cup of tofu.  Eat some foods  each day that contain healthy fats, such as avocado, nuts, seeds, and fish. Lifestyle  Check your blood glucose regularly.  Exercise regularly as told by your health care provider. This may include: ? 150 minutes of moderate-intensity or vigorous-intensity exercise each week. This could be brisk walking, biking, or water aerobics. ? Stretching and doing strength exercises, such as yoga or weightlifting, at least 2 times a week.  Take medicines as told by your health care provider.  Do not use any products that contain nicotine or tobacco, such as cigarettes and e-cigarettes. If you need help quitting, ask your health care provider.  Work with a Social worker or diabetes educator to identify strategies to manage stress and any emotional and social challenges. Questions to ask a health care provider  Do I need to meet with a diabetes educator?  Do I need to meet with a dietitian?  What number can I call if I have questions?  When are the best times to check my blood glucose? Where to find more information:  American Diabetes Association: diabetes.org  Academy of Nutrition and Dietetics: www.eatright.[employer] of Diabetes and Digestive and Kidney Diseases (NIH): [employer] Summary  A healthy meal plan will help you control your blood glucose and maintain a healthy lifestyle.  Working with a diet and nutrition specialist (dietitian) can help you make a meal plan that is best for you.  Keep in mind that carbohydrates (carbs) and alcohol have immediate effects on your blood glucose levels. It is important to count carbs and to use alcohol carefully. This information is not intended to replace advice given to you by your health care provider. Make sure you discuss any questions you have with your health care provider. Document Released: 12/03/2004 Document Revised:  10/06/2016 Document Reviewed: 04/12/2016 Elsevier Interactive Patient Education  2019 [place].      Insulin Treatment for Diabetes Mellitus Diabetes (diabetes mellitus) is a long-term (chronic) disease. It occurs when the body does not properly use sugar (glucose) that is released from food after digestion. Glucose levels are controlled by a hormone called insulin. Insulin is made in the pancreas, which is an organ behind the stomach.  If you have type 1 diabetes, you must take insulin because your pancreas does not make any.  If you have type 2 diabetes, you might need to take insulin along with other medicines. In type 2 diabetes, one or both of these problems may be present: ? The pancreas does not make enough insulin. ? Cells in the body do not respond properly to insulin that the body makes (insulin resistance). You must use insulin correctly to control your diabetes. You must have some insulin in your body at all times. Insulin treatment varies depending on your type of diabetes, your treatment goals, and your medical history. Ask questions to understand your insulin treatment plan so you can be an active partner in managing your diabetes. How is insulin given? Insulin can only be given through a shot (injection). It is injected using a syringe and needle, an insulin pen, a pump, or a jet injector. Your health care provider will:  Prescribe the type and amount of insulin that you need.  Tell you when you should inject your insulin. Where on the body should insulin be injected? Insulin is injected into a layer of fatty tissue under the skin. Good places to inject insulin include:  Abdomen. Generally, the abdomen is the best place to inject insulin. However, you should avoid any area  that is less than 2 inches (5 cm) from the belly button (navel).  Front of thigh.  Upper, outer side of thigh.  Upper, outer side of arm.  Upper, outer part of buttock. It is important  to:  Give your injection in a slightly different place each time. This helps to prevent irritation and improve absorption.  Avoid injecting into areas that have scar tissue. Usually, you will give yourself insulin injections. Others can also be taught how to give you injections. You will use a special type of syringe that is made only for insulin. Some people may have an insulin pump that delivers insulin steadily through a tube (cannula) that is placed under the skin. What are the different types of insulin? The following information is a general guide to different types of insulin. Specifics vary depending on the insulin product that your health care provider prescribes.  Rapid-acting insulin: ? Starts working quickly, in as little as 5 minutes. ? Can last for 4-6 hours (or sometimes longer). ? Works well when taken right before a meal to quickly lower your blood glucose.  Short-acting insulin: ? Starts working in about 30 minutes. ? Can last for 6-10 hours. ? Should be taken about 30 minutes before you start eating a meal.  Intermediate-acting insulin: ? Starts working in 1-2 hours. ? Lasts for about 10-18 hours. ? Lowers your blood glucose for a longer period of time, but it is not as effective for lowering blood glucose right after a meal.  Long-acting insulin: ? Mimics the small amount of insulin that your pancreas usually produces throughout the day. ? Should be used one or two times a day. ? Is usually used in combination with other types of insulin or other medicines.  Concentrated insulin, or U-500 insulin: ? Contains a higher dose of insulin than most rapid-acting insulins. U-500 insulin has 5 times the amount of insulin per 1 mL. ? Should only be used with the special U-500 syringe or U-500 insulin pen. It is dangerous to use the wrong type of syringe with this insulin. What are the side effects of insulin? Possible side effects of insulin treatment include:  Low blood  glucose (hypoglycemia).  Weight gain.  High blood glucose (hyperglycemia).  Skin injury or irritation. Some of these side effects can be caused by using improper injection technique. Be sure to learn how to inject insulin properly. What are common terms associated with insulin treatment? Some terms that you might hear include:  Basal insulin, or basal rate. This is the constant amount of insulin that needs to be present in your body to keep your blood glucose levels stable. People who have type 1 diabetes need basal insulin in a steady (continuous) dose 24 hours a day. ? Usually, intermediate-acting or long-acting insulin is used one or two times a day to manage basal insulin levels. ? Medicines that are taken by mouth may also be recommended to manage basal insulin levels.  Prandial or nutrition insulin. This refers to meal-related insulin. ? Blood glucose rises quickly after a meal (postprandial). Rapid-acting or short-acting insulin can be used right before a meal (preprandial) to quickly lower your blood glucose. ? You may be instructed to adjust the amount of prandial insulin that you take, based on how much carbohydrate (starch) is in your meal.  Corrective insulin. This may also be called a correction dose or supplemental dose. This is a small amount of rapid-acting or short-acting insulin that can be used to lower your  blood glucose if it is too high. You may be instructed to check your blood glucose at certain times of the day and use corrective insulin as needed.  Tight control, or intensive therapy. This means keeping your blood glucose as close to your target as possible, and preventing your blood glucose from getting too high after meals. People who have tight control of their diabetes have fewer long-term problems caused by diabetes. Follow these instructions at home: Talk with your health care provider or pharmacist about the type of insulin you should take and when you should  take it. You should know when your insulin goes up the most (peaks) and when it wears off. You need this information so you can plan your meals and exercise. Work with your health care provider to:  Check your blood glucose every day. Your health care provider will tell you how often and when you should do this.  Manage your: ? Weight. ? Blood pressure. ? Cholesterol. ? Stress.  Eat a healthy diet.  Exercise regularly. Summary  Diabetes is a long-term (chronic) disease. It occurs when the body does not properly use sugar (glucose) that is released from food after digestion. Glucose levels are controlled by a hormone called insulin, which is made in an organ behind your stomach (pancreas).  You must use insulin correctly to control your diabetes. You must have some insulin in your body at all times.  Insulin treatment varies depending on your type of diabetes, your treatment goals, and your medical history.  Talk with your health care provider or pharmacist about the type of insulin you should take and when you should take it.  Check your blood glucose every day. Your health care provider will tell you how often and when you should check it. This information is not intended to replace advice given to you by your health care provider. Make sure you discuss any questions you have with your health care provider. Document Released: 06/04/2008 Document Revised: 09/29/2016 Document Reviewed: 04/11/2015 Elsevier Interactive Patient Education  2019 [place].   Insulin Storage and Care  All insulin pens and bottles (vials) have expiration dates. Insulin pens and vials that are refrigerated and unopened are good until the expiration date. Once opened, vials are good for 28 days. "Opened" means that the rubber has been punctured. Once in use, pen expiration dates vary depending on the type of insulin being used. Do not use insulin after the expiration date. Always follow the instructions that  come with your insulin. How to store your insulin  Store your insulin according to instructions on the packaging.  If insulin is kept at room temperature, keep the temperature between 56-60F (13-27C). ? Opened insulin vials may be kept at room temperature, or in the refrigerator and warmed to room temperature before use. ? Opened insulin pens should be kept at room temperature.  If insulin is kept in the refrigerator, keep the temperature between 36-32F (3-8C).  Do not freeze insulin.  Keep insulin away from direct heat or sunlight. How to throw away your supplies  Throw away your insulin if: ? It is discolored. ? It is thick. ? It has clumps in it. ? It has white particles suspended in it.  Discard all used needles in a puncture-proof sharps disposal container. You can ask your local pharmacy about where you can get this kind of disposal container, or you can use an empty liquid laundry detergent bottle that has a lid, for example.  Follow the  disposal regulations for the area where you live.  Do not use any syringe or needle more than one time.  Throw away empty vials and pens (with needles removed) in the regular trash. General recommendations  Always keep extra insulin and supplies with you.  Always inspect your insulin prior to injecting. Do not use if you notice any discoloration, particles, or clumping.  Mix cloudy insulin by gently rolling the vial between your hands or "rocking" the pen from end to end at least 10 times.  Do not leave insulin in your vehicle or in any place where it can get too hot or too cold.  Use an insulated travel pack to store your insulin vials or pens when traveling. Where to find more information  American Diabetes Association: www.diabetes.[employer] of Diabetes and Digestive and Kidney Diseases: [employer] Summary  Do not store insulin in extreme heat or cold, such as in  the freezer, direct sunlight, or your vehicle.  Check the expiration date before using insulin and do not use insulin past the expiration date.  Check insulin before using to make sure it looks normal. Mix cloudy insulin before using. Do not use insulin if it is discolored, has particles, or clumps. This information is not intended to replace advice given to you by your health care provider. Make sure you discuss any questions you have with your health care provider. Document Released: 01/03/2009 Document Revised: 04/15/2016 Document Reviewed: 04/15/2016 Elsevier Interactive Patient Education  2019 [employer]  Hypoglycemia Hypoglycemia is when the sugar (glucose) level in your blood is too low. Signs of low blood sugar may include:  Feeling: ? Hungry. ? Worried or nervous (anxious). ? Sweaty and clammy. ? Confused. ? Dizzy. ? Sleepy. ? Sick to your stomach (nauseous).  Having: ? A fast heartbeat. ? A headache. ? A change in your vision. ? Tingling or no feeling (numbness) around your mouth, lips, or tongue. ? Jerky movements that you cannot control (seizure).  Having trouble with: ? Moving (coordination). ? Sleeping. ? Passing out (fainting). ? Getting upset easily (irritability). Low blood sugar can happen to people who have diabetes and people who do not have diabetes. Low blood sugar can happen quickly, and it can be an emergency. Treating low blood sugar Low blood sugar is often treated by eating or drinking something sugary right away, such as:  Fruit juice, 4-6 oz (120-150 mL).  Regular soda (not diet soda), 4-6 oz (120-150 mL).  Low-fat milk, 4 oz (120 mL).  Several pieces of hard candy.  Sugar or honey, 1 Tbsp (15 mL). Treating low blood sugar if you have diabetes If you can think clearly and swallow safely, follow the 15:15 rule:  Take 15 grams of a fast-acting carb (carbohydrate). Talk with your doctor about how much you should take.  Always keep a  source of fast-acting carb with you, such as: ? Sugar tablets (glucose pills). Take 3-4 pills. ? 6-8 pieces of hard candy. ? 4-6 oz (120-150 mL) of fruit juice. ? 4-6 oz (120-150 mL) of regular (not diet) soda. ? 1 Tbsp (15 mL) honey or sugar.  Check your blood sugar 15 minutes after you take the carb.  If your blood sugar is still at or below 70 mg/dL (3.9 mmol/L), take 15 grams of a carb again.  If your blood sugar does not go above 70 mg/dL (3.9 mmol/L) after 3 tries, get help right away.  After your blood sugar goes back to normal, eat  a meal or a snack within 1 hour.  Treating very low blood sugar If your blood sugar is at or below 54 mg/dL (3 mmol/L), you have very low blood sugar (severe hypoglycemia). This may also cause:  Passing out.  Jerky movements you cannot control (seizure).  Losing consciousness (coma). This is an emergency. Do not wait to see if the symptoms will go away. Get medical help right away. Call your local emergency services (911 in the [place] Do not drive yourself to the hospital. If you have very low blood sugar and you cannot eat or drink, you may need a glucagon shot (injection). A family member or friend should learn how to check your blood sugar and how to give you a glucagon shot. Ask your doctor if you need to have a glucagon shot kit at home. Follow these instructions at home: General instructions  Take over-the-counter and prescription medicines only as told by your doctor.  Stay aware of your blood sugar as told by your doctor.  Limit alcohol intake to no more than 1 drink a day for nonpregnant women and 2 drinks a day for men. One drink equals 12 oz of beer (355 mL), 5 oz of wine (148 mL), or 1 oz of hard liquor (44 mL).  Keep all follow-up visits as told by your doctor. This is important. If you have diabetes:   Follow your diabetes care plan as told by your doctor. Make sure you: ? Know the signs of low blood sugar. ? Take your  medicines as told. ? Follow your exercise and meal plan. ? Eat on time. Do not skip meals. ? Check your blood sugar as often as told by your doctor. Always check it before and after exercise. ? Follow your sick day plan when you cannot eat or drink normally. Make this plan ahead of time with your doctor.  Share your diabetes care plan with: ? Your work or school. ? People you live with.  Check your pee (urine) for ketones: ? When you are sick. ? As told by your doctor.  Carry a card or wear jewelry that says you have diabetes. Contact a doctor if:  You have trouble keeping your blood sugar in your target range.  You have low blood sugar often. Get help right away if:  You still have symptoms after you eat or drink something sugary.  Your blood sugar is at or below 54 mg/dL (3 mmol/L).  You have jerky movements that you cannot control.  You pass out. These symptoms may be an emergency. Do not wait to see if the symptoms will go away. Get medical help right away. Call your local emergency services (911 in the [place] Do not drive yourself to the hospital. Summary  Hypoglycemia happens when the level of sugar (glucose) in your blood is too low.  Low blood sugar can happen to people who have diabetes and people who do not have diabetes. Low blood sugar can happen quickly, and it can be an emergency.  Make sure you know the signs of low blood sugar and know how to treat it.  Always keep a source of sugar (fast-acting carb) with you to treat low blood sugar. This information is not intended to replace advice given to you by your health care provider. Make sure you discuss any questions you have with your health care provider. Document Released: 06/02/2009 Document Revised: 08/30/2017 Document Reviewed: 04/11/2015 Elsevier Interactive Patient Education  2019 [employer].

## 2018-05-04 NOTE — Discharge Summary (Addendum)
Physician Discharge Summary  Kenneth [name] FAO:[id number] DOB: 12/13/1965 DOA: 05/02/2018  PCP: [name], MD  Admit date: 05/02/2018 Discharge date: 05/04/2018  Admitted From: Home  Disposition: Home   Recommendations for Outpatient Follow-up:  1. Follow up with PCP in 1-5 days for recheck  Discharge Condition: STABLE   CODE STATUS: FULL    Brief Hospitalization Summary: Please see all hospital notes, images, labs for full details of the hospitalization. Dr. Talmadge Coventry HPI: Kenneth [name] is a 53 y.o. male with medical history significant for DM 2 with multiple admissions for DKA secondary to noncompliance, hypothyroidism, cocaine abuse, homelessness who came to the ED with complaints of nausea, vomiting over the past few days, with weakness and lightheadedness with subsequent falls twice today.  He reports large volumes of urine over the past few days, none since admission. He admits he has not been compliant with his Lantus, but  That he has been taking his NovoLog. He reports pain all over his body, and chills.   ED Course: hypotensive blood pressure systolic down to 69/62, 2 L bolus given.  Leukocytosis 21.  Marked hyperglycemia 1296, with anion gap greater than 20, low bicarb less than 7, creatinine elevated 2.68.  Compensatory low sodium 124.  Hyperkalemia 6.7.  UA pending.  Portable chest x-ray negative for acute abnormality.  Insulin GTT started.  Blood cultures obtained.  Started on empiric antibiotics for possible sepsis with vancomycin and cefepime.  Hospitalist to admit for DKA.  Brief Admission Hx: 53 year old male with type 1 diabetes mellitus on insulin and with past medical history of homelessness and substance abuse presented to the emergency department and was admitted with severe diabetic ketoacidosis.  MDM/Assessment & Plan:   1. Diabetic ketoacidosis- patient has been treated in the stepdown unit with an IV insulin infusion and the anion gap has closed.  The  patient's blood glucose is now down to 100-200 range.  He is mentating well.  He has no nausea or vomiting.  He has been transitioned over to subcutaneous insulin.  He is being monitored and educated by the diabetes education team. 2. Type 1 diabetes mellitus-hemoglobin A1c greater than 11% which indicates poor glycemic control.  Patient has not been caring for his diabetes likely due to substance abuse.  I have asked the social workers and care management team to meet with the patient and to offer support and assistance for managing diabetes.  The patient says that he has his insulin supplies.  I have asked care management to assist him with his medications and supplies and outpatient follow up.  3. URI - he was initially treated with broad spectrum antibiotics but has been de-escalated. No pneumonia was found.  4. Hypotension-RESOLVED.  This was treated with IV fluid hydration. 5. Hypokalemia-REPLETED.  IV replacement ordered.  6. Lactic acidosis- RESOLVED. Improved with aggressive treatments. 7. Acute kidney injury-RESOLVED. Prerenal and responded well to IV fluid hydration. 8. Substance abuse and homelessness- consulted social worker and care management team. 9. Leukocytosis-RESOLVED.  WBC normalized.  10. Sirs resolved  DVT prophylaxis: Lovenox Code Status: Full  Family Communication: Patient Disposition Plan: Home   Consultants:  Diabetes coordinator  Procedures:  N/A  Antimicrobials:  Cefepime   vanc   doxycycline  Discharge Diagnoses:  Principal Problem:   DKA (diabetic ketoacidoses) ([place]) Active Problems:   Neuropathy   Protein-calorie malnutrition, severe   Non compliance w medication regimen   Hypotension   Cocaine abuse ([place])   AKI (acute kidney injury) ([place])  Discharge Instructions: Discharge Instructions    Call MD for:  difficulty breathing, headache or visual disturbances   Complete by:  As directed    Call MD for:  extreme fatigue   Complete by:   As directed    Call MD for:  persistant dizziness or light-headedness   Complete by:  As directed    Call MD for:  persistant nausea and vomiting   Complete by:  As directed    Call MD for:  severe uncontrolled pain   Complete by:  As directed      Allergies as of 05/04/2018      Reactions   Metformin And Related Other (See Comments)   Abdominal cramps and diarrhea   Nsaids Nausea Only      Medication List    TAKE these medications   blood glucose meter kit and supplies Dispense based on patient and insurance preference. Use up to four times daily as directed. (FOR ICD-10 E10.9, E11.9).   doxycycline 100 MG tablet Commonly known as:  VIBRA-TABS Take 1 tablet (100 mg total) by mouth every 12 (twelve) hours for 3 days.   gabapentin 300 MG capsule Commonly known as:  NEURONTIN Take 300 mg by mouth 4 (four) times daily.   insulin aspart 100 UNIT/ML injection Commonly known as:  NOVOLOG Inject 8 Units into the skin 3 (three) times daily with meals for 30 days. What changed:  how much to take   insulin glargine 100 UNIT/ML injection Commonly known as:  LANTUS Inject 0.25 mLs (25 Units total) into the skin daily. What changed:    how much to take  when to take this   INSULIN SYRINGE .5CC/29G 29G X 1/2" 0.5 ML Misc Use as directed   lisinopril 2.5 MG tablet Commonly known as:  ZESTRIL Take 1 tablet (2.5 mg total) by mouth daily for 30 days. Start taking on:  May 05, 2018 What changed:    medication strength  how much to take      Follow-up Information    [name], MD. Schedule an appointment as soon as possible for a visit in 1 day(s).   Specialty:  Internal Medicine Why:  Hospital Follow Up  Contact information: [place] [address] [phone]          Allergies  Allergen Reactions  . Metformin And Related Other (See Comments)    Abdominal cramps and diarrhea  . Nsaids Nausea Only   Allergies as of 05/04/2018       Reactions   Metformin And Related Other (See Comments)   Abdominal cramps and diarrhea   Nsaids Nausea Only      Medication List    TAKE these medications   blood glucose meter kit and supplies Dispense based on patient and insurance preference. Use up to four times daily as directed. (FOR ICD-10 E10.9, E11.9).   doxycycline 100 MG tablet Commonly known as:  VIBRA-TABS Take 1 tablet (100 mg total) by mouth every 12 (twelve) hours for 3 days.   gabapentin 300 MG capsule Commonly known as:  NEURONTIN Take 300 mg by mouth 4 (four) times daily.   insulin aspart 100 UNIT/ML injection Commonly known as:  NOVOLOG Inject 8 Units into the skin 3 (three) times daily with meals for 30 days. What changed:  how much to take   insulin glargine 100 UNIT/ML injection Commonly known as:  LANTUS Inject 0.25 mLs (25 Units total) into the skin daily. What changed:  how much to take  when to take this   INSULIN SYRINGE .5CC/29G 29G X 1/2" 0.5 ML Misc Use as directed   lisinopril 2.5 MG tablet Commonly known as:  ZESTRIL Take 1 tablet (2.5 mg total) by mouth daily for 30 days. Start taking on:  May 05, 2018 What changed:    medication strength  how much to take       Procedures/Studies: Dg Chest Port 1 View  Result Date: 05/03/2018 CLINICAL DATA:  Severe sepsis EXAM: PORTABLE CHEST 1 VIEW COMPARISON:  Chest radiograph from one day prior. FINDINGS: Stable cardiomediastinal silhouette with normal heart size. No pneumothorax. No pleural effusion. Lungs appear clear, with no acute consolidative airspace disease and no pulmonary edema. IMPRESSION: No active disease. Electronically Signed   By: Ilona Sorrel M.D.   On: 05/03/2018 17:18   Dg Chest Portable 1 View  Result Date: 05/02/2018 CLINICAL DATA:  Dyspnea and pain all over EXAM: PORTABLE CHEST 1 VIEW COMPARISON:  10/16/2017 CXR FINDINGS: The heart size and mediastinal contours are within normal limits. Both lungs are  clear. The visualized skeletal structures are unremarkable. IMPRESSION: No active disease. Electronically Signed   By: Ashley Royalty M.D.   On: 05/02/2018 19:16     Subjective: Pt angry and combattive when told he was going to be discharged today.  Says his throat is sore.  I looked at it and it appeared within normal limits.  He has a few remaining teeth that need to be pulled.   Discharge Exam: Vitals:   05/03/18 2151 05/04/18 0653  BP: 96/77 122/86  Pulse: 77 78  Resp: 16 18  Temp: 98.4 F (36.9 C) 98.2 F (36.8 C)  SpO2: 100% 100%   Vitals:   05/03/18 1400 05/03/18 1950 05/03/18 2151 05/04/18 0653  BP: (!) 80/60  96/77 122/86  Pulse: 79  77 78  Resp: _0 Temp:   98.4 F (36.9 C) 98.2 F (36.8 C)  TempSrc:   Oral Oral  SpO2: 100% 95% 100% 100%  Weight:      Height:       General exam: Emaciated, chronically ill-appearing male and poorly groomed.  poor dentition mostly edentulous, oropharynx appears normal. Respiratory system: Clear. No increased work of breathing. Cardiovascular system: S1 & S2 heard. No JVD, murmurs, gallops, clicks or pedal edema. Gastrointestinal system: Abdomen is nondistended, soft and nontender. Normal bowel sounds heard. Central nervous system: Alert and oriented. No focal neurological deficits. Extremities: no CCE.   The results of significant diagnostics from this hospitalization (including imaging, microbiology, ancillary and laboratory) are listed below for reference.     Microbiology: Recent Results (from the past 240 hour(s))  Blood culture (routine x 2)     Status: None (Preliminary result)   Collection Time: 05/02/18  7:22 PM  Result Value Ref Range Status   Specimen Description RIGHT ANTECUBITAL  Final   Special Requests   Final    BOTTLES DRAWN AEROBIC AND ANAEROBIC Blood Culture adequate volume   Culture   Final    NO GROWTH 2 DAYS Performed at [facility], [address], [place], [place] [address]    Report Status  PENDING  Incomplete  Blood culture (routine x 2)     Status: None (Preliminary result)   Collection Time: 05/02/18  7:26 PM  Result Value Ref Range Status   Specimen Description BLOOD RIGHT FOREARM  Final   Special Requests   Final    BOTTLES DRAWN AEROBIC  AND ANAEROBIC Blood Culture adequate volume   Culture   Final    NO GROWTH 2 DAYS Performed at [facility], [address], [place], [place] [address]    Report Status PENDING  Incomplete  Culture, Urine     Status: None   Collection Time: 05/02/18  9:12 PM  Result Value Ref Range Status   Specimen Description   Final    URINE, CLEAN CATCH Performed at [facility], [address] [place], [place] [address]    Special Requests   Final    NONE Performed at [facility], [address], [place], [place] [address]    Culture   Final    NO GROWTH Performed at [facility], [place] [address], [place], [place] [address]    Report Status 05/04/2018 FINAL  Final  MRSA PCR Screening     Status: None   Collection Time: 05/02/18 11:26 PM  Result Value Ref Range Status   MRSA by PCR NEGATIVE NEGATIVE Final    Comment:        The GeneXpert MRSA Assay (FDA approved for NASAL specimens only), is one component of a comprehensive MRSA colonization surveillance program. It is not intended to diagnose MRSA infection nor to guide or monitor treatment for MRSA infections. Performed at [facility], [address], [place], [place] [address]      Labs: BNP (last 3 results) No results for input(s): BNP in the last 8760 hours. Basic Metabolic Panel: Recent Labs  Lab 05/02/18 1920 05/02/18 2159 05/03/18 0123 05/03/18 0547 05/04/18 0439  NA 124* 131* 137 137 138  K 6.7* 5.3* 3.5 2.7* 3.5  CL 80* 93* 103 104 107  CO2 <7* <7* 13* 23 25  GLUCOSE 1,296* 1,106* 622* 187* 110*  BUN 44* 43* 38* 28* 15  CREATININE 2.68* 2.37* 1.90* 1.15 0.64  CALCIUM 8.9 7.7* 7.3* 7.4* 8.3*  MG  --   --  1.9  --  1.9   Liver Function  Tests: Recent Labs  Lab 05/02/18 1920  AST 31  ALT 43  ALKPHOS 79  BILITOT 1.7*  PROT 7.3  ALBUMIN 4.4   Recent Labs  Lab 05/02/18 1920 05/04/18 0439  LIPASE 89* 16   No results for input(s): AMMONIA in the last 168 hours. CBC: Recent Labs  Lab 05/02/18 1920 05/03/18 0123 05/04/18 0439  WBC 21.5* 14.2* 9.0  NEUTROABS 18.4*  --   --   HGB 14.1 11.4* 11.5*  HCT 48.1 33.9* 33.0*  MCV 108.8* 93.1 91.9  PLT 260 185 144*   Cardiac Enzymes: No results for input(s): CKTOTAL, CKMB, CKMBINDEX, TROPONINI in the last 168 hours. BNP: Invalid input(s): POCBNP CBG: Recent Labs  Lab 05/03/18 2147 05/04/18 0249 05/04/18 0315 05/04/18 0814 05/04/18 1030  GLUCAP 114* 60* 101* 77 183*   D-Dimer No results for input(s): DDIMER in the last 72 hours. Hgb A1c Recent Labs    05/02/18 1920  HGBA1C 12.7*   Lipid Profile No results for input(s): CHOL, HDL, LDLCALC, TRIG, CHOLHDL, LDLDIRECT in the last 72 hours. Thyroid function studies No results for input(s): TSH, T4TOTAL, T3FREE, THYROIDAB in the last 72 hours.  Invalid input(s): FREET3 Anemia work up No results for input(s): VITAMINB12, FOLATE, FERRITIN, TIBC, IRON, RETICCTPCT in the last 72 hours. Urinalysis    Component Value Date/Time   COLORURINE STRAW (A) 05/02/2018 1852   APPEARANCEUR CLEAR 05/02/2018 1852   LABSPEC 1.015 05/02/2018 1852   PHURINE 5.0 05/02/2018 1852   GLUCOSEU >=500 (  A) 05/02/2018 1852   HGBUR SMALL (A) 05/02/2018 1852   BILIRUBINUR NEGATIVE 05/02/2018 1852   KETONESUR 80 (A) 05/02/2018 1852   PROTEINUR NEGATIVE 05/02/2018 1852   UROBILINOGEN 0.2 01/18/2015 2300   NITRITE NEGATIVE 05/02/2018 1852   LEUKOCYTESUR NEGATIVE 05/02/2018 1852   Sepsis Labs Invalid input(s): PROCALCITONIN,  WBC,  LACTICIDVEN Microbiology Recent Results (from the past 240 hour(s))  Blood culture (routine x 2)     Status: None (Preliminary result)   Collection Time: 05/02/18  7:22 PM  Result Value Ref Range  Status   Specimen Description RIGHT ANTECUBITAL  Final   Special Requests   Final    BOTTLES DRAWN AEROBIC AND ANAEROBIC Blood Culture adequate volume   Culture   Final    NO GROWTH 2 DAYS Performed at [facility], [address] [place], [place] [address]    Report Status PENDING  Incomplete  Blood culture (routine x 2)     Status: None (Preliminary result)   Collection Time: 05/02/18  7:26 PM  Result Value Ref Range Status   Specimen Description BLOOD RIGHT FOREARM  Final   Special Requests   Final    BOTTLES DRAWN AEROBIC AND ANAEROBIC Blood Culture adequate volume   Culture   Final    NO GROWTH 2 DAYS Performed at [facility], [address] [place], [place] [address]    Report Status PENDING  Incomplete  Culture, Urine     Status: None   Collection Time: 05/02/18  9:12 PM  Result Value Ref Range Status   Specimen Description   Final    URINE, CLEAN CATCH Performed at [facility], [address], [place], [place] [address]    Special Requests   Final    NONE Performed at [facility], [address], [place], [place] [address]    Culture   Final    NO GROWTH Performed at [facility], [place] [address], [place], [place] [address]    Report Status 05/04/2018 FINAL  Final  MRSA PCR Screening     Status: None   Collection Time: 05/02/18 11:26 PM  Result Value Ref Range Status   MRSA by PCR NEGATIVE NEGATIVE Final    Comment:        The GeneXpert MRSA Assay (FDA approved for NASAL specimens only), is one component of a comprehensive MRSA colonization surveillance program. It is not intended to diagnose MRSA infection nor to guide or monitor treatment for MRSA infections. Performed at [name], [address] [place], [place] [address]     Time coordinating discharge: 32 minutes   SIGNED:  Irwin Brakeman, MD  [facility] 05/04/2018, 10:57 AM How to contact the [facility] Attending or Consulting provider [place] or covering provider during after  hours [place], for this patient?  1. Check the care team in [facility] and look for a) attending/consulting [facility] provider listed and b) the [facility] team listed 2. Log into [email or web address] and use [place]'s universal password to access. If you do not have the password, please contact the hospital operator. 3. Locate the [facility] provider you are looking for under [facility] and page to a number that you can be directly reached. 4. If you still have difficulty reaching the provider, please page the [facility] (Director on Call) for the Hospitalists listed on amion for assistance.

## 2018-05-04 NOTE — Progress Notes (Addendum)
Attempted to review discharge instructions with patient and patient refused all teaching and stated that "all staff are liars". Paperwork was provided to patient and he expressed that he had no questions. IV removed and cab called for patient with voucher. Pt berating and angrily expressing his frustration to [employer] fundraiser. RN answered all questions as able and placed patient safely in cab.

## 2018-05-07 LAB — CULTURE, BLOOD (ROUTINE X 2)
Culture: NO GROWTH
Culture: NO GROWTH
SPECIAL REQUESTS: ADEQUATE
Special Requests: ADEQUATE

## 2018-06-06 ENCOUNTER — Encounter (HOSPITAL_COMMUNITY): Payer: Self-pay | Admitting: Emergency Medicine

## 2018-06-06 ENCOUNTER — Other Ambulatory Visit: Payer: Self-pay

## 2018-06-06 ENCOUNTER — Observation Stay (HOSPITAL_COMMUNITY)
Admission: EM | Admit: 2018-06-06 | Discharge: 2018-06-07 | Disposition: A | Discharge status: Home / Self Care | Payer: Medicaid Other | Attending: Internal Medicine | Admitting: Internal Medicine

## 2018-06-06 ENCOUNTER — Emergency Department (HOSPITAL_COMMUNITY): Payer: Medicaid Other

## 2018-06-06 DIAGNOSIS — K210 Gastro-esophageal reflux disease with esophagitis: Secondary | ICD-10-CM | POA: Diagnosis not present

## 2018-06-06 DIAGNOSIS — Z720 Tobacco use: Secondary | ICD-10-CM

## 2018-06-06 DIAGNOSIS — Z888 Allergy status to other drugs, medicaments and biological substances status: Secondary | ICD-10-CM | POA: Diagnosis not present

## 2018-06-06 DIAGNOSIS — F17210 Nicotine dependence, cigarettes, uncomplicated: Secondary | ICD-10-CM | POA: Diagnosis not present

## 2018-06-06 DIAGNOSIS — E1100 Type 2 diabetes mellitus with hyperosmolarity without nonketotic hyperglycemic-hyperosmolar coma (NKHHC): Principal | ICD-10-CM | POA: Diagnosis present

## 2018-06-06 DIAGNOSIS — G629 Polyneuropathy, unspecified: Secondary | ICD-10-CM

## 2018-06-06 DIAGNOSIS — F1410 Cocaine abuse, uncomplicated: Secondary | ICD-10-CM | POA: Diagnosis present

## 2018-06-06 DIAGNOSIS — Z886 Allergy status to analgesic agent status: Secondary | ICD-10-CM | POA: Diagnosis not present

## 2018-06-06 DIAGNOSIS — Z833 Family history of diabetes mellitus: Secondary | ICD-10-CM | POA: Diagnosis not present

## 2018-06-06 DIAGNOSIS — K209 Esophagitis, unspecified without bleeding: Secondary | ICD-10-CM | POA: Diagnosis present

## 2018-06-06 DIAGNOSIS — E875 Hyperkalemia: Secondary | ICD-10-CM | POA: Diagnosis not present

## 2018-06-06 DIAGNOSIS — E1110 Type 2 diabetes mellitus with ketoacidosis without coma: Secondary | ICD-10-CM | POA: Diagnosis not present

## 2018-06-06 DIAGNOSIS — E860 Dehydration: Secondary | ICD-10-CM | POA: Diagnosis not present

## 2018-06-06 DIAGNOSIS — Z716 Tobacco abuse counseling: Secondary | ICD-10-CM | POA: Diagnosis not present

## 2018-06-06 DIAGNOSIS — Z794 Long term (current) use of insulin: Secondary | ICD-10-CM | POA: Diagnosis not present

## 2018-06-06 DIAGNOSIS — G8929 Other chronic pain: Secondary | ICD-10-CM | POA: Diagnosis not present

## 2018-06-06 DIAGNOSIS — E1140 Type 2 diabetes mellitus with diabetic neuropathy, unspecified: Secondary | ICD-10-CM | POA: Diagnosis not present

## 2018-06-06 DIAGNOSIS — E871 Hypo-osmolality and hyponatremia: Secondary | ICD-10-CM | POA: Diagnosis present

## 2018-06-06 DIAGNOSIS — I10 Essential (primary) hypertension: Secondary | ICD-10-CM | POA: Insufficient documentation

## 2018-06-06 DIAGNOSIS — Z79899 Other long term (current) drug therapy: Secondary | ICD-10-CM | POA: Insufficient documentation

## 2018-06-06 DIAGNOSIS — E079 Disorder of thyroid, unspecified: Secondary | ICD-10-CM | POA: Diagnosis not present

## 2018-06-06 DIAGNOSIS — Z9114 Patient's other noncompliance with medication regimen: Secondary | ICD-10-CM | POA: Insufficient documentation

## 2018-06-06 DIAGNOSIS — Z91148 Patient's other noncompliance with medication regimen for other reason: Secondary | ICD-10-CM

## 2018-06-06 LAB — BASIC METABOLIC PANEL
Anion gap: 8 (ref 5–15)
BUN: 23 mg/dL — ABNORMAL HIGH (ref 6–20)
CALCIUM: 8.5 mg/dL — AB (ref 8.9–10.3)
CO2: 27 mmol/L (ref 22–32)
Chloride: 100 mmol/L (ref 98–111)
Creatinine, Ser: 0.75 mg/dL (ref 0.61–1.24)
GFR calc Af Amer: >60 mL/min (ref >60)
GFR calc non Af Amer: >60 mL/min (ref >60)
Glucose, Bld: 125 mg/dL — ABNORMAL HIGH (ref 70–99)
Potassium: 3.1 mmol/L — ABNORMAL LOW (ref 3.5–5.1)
Sodium: 135 mmol/L (ref 135–145)

## 2018-06-06 LAB — GLUCOSE, CAPILLARY
Glucose-Capillary: >600 mg/dL (ref 70–99)
Glucose-Capillary: >600 mg/dL (ref 70–99)
Glucose-Capillary: 290 mg/dL — ABNORMAL HIGH (ref 70–99)
Glucose-Capillary: 131 mg/dL — ABNORMAL HIGH (ref 70–99)
Glucose-Capillary: 74 mg/dL (ref 70–99)
Glucose-Capillary: 94 mg/dL (ref 70–99)
Glucose-Capillary: 199 mg/dL — ABNORMAL HIGH (ref 70–99)

## 2018-06-06 LAB — COMPREHENSIVE METABOLIC PANEL
ALT: 54 U/L — AB (ref 0–44)
AST: 59 U/L — ABNORMAL HIGH (ref 15–41)
Albumin: 4.6 g/dL (ref 3.5–5.0)
Alkaline Phosphatase: 124 U/L (ref 38–126)
Anion gap: 17 — ABNORMAL HIGH (ref 5–15)
BUN: 28 mg/dL — ABNORMAL HIGH (ref 6–20)
CALCIUM: 8.8 mg/dL — AB (ref 8.9–10.3)
CO2: 21 mmol/L — ABNORMAL LOW (ref 22–32)
Chloride: 83 mmol/L — ABNORMAL LOW (ref 98–111)
Creatinine, Ser: 1.11 mg/dL (ref 0.61–1.24)
GFR calc Af Amer: >60 mL/min (ref >60)
GFR calc non Af Amer: >60 mL/min (ref >60)
Glucose, Bld: 1040 mg/dL (ref 70–99)
Potassium: 5.5 mmol/L — ABNORMAL HIGH (ref 3.5–5.1)
Sodium: 121 mmol/L — ABNORMAL LOW (ref 135–145)
Total Bilirubin: 0.8 mg/dL (ref 0.3–1.2)
Total Protein: 8.3 g/dL — ABNORMAL HIGH (ref 6.5–8.1)

## 2018-06-06 LAB — CBG MONITORING, ED
Glucose-Capillary: >600 mg/dL (ref 70–99)
Glucose-Capillary: >600 mg/dL (ref 70–99)

## 2018-06-06 LAB — MRSA PCR SCREENING: MRSA by PCR: NEGATIVE

## 2018-06-06 LAB — CBC WITH DIFFERENTIAL/PLATELET
Abs Immature Granulocytes: 0.02 10*3/uL (ref 0.00–0.07)
Basophils Absolute: 0.1 10*3/uL (ref 0.0–0.1)
Basophils Relative: 1 %
EOS PCT: 1 %
Eosinophils Absolute: 0.1 10*3/uL (ref 0.0–0.5)
HEMATOCRIT: 43.9 % (ref 39.0–52.0)
Hemoglobin: 13.9 g/dL (ref 13.0–17.0)
Immature Granulocytes: 0 %
LYMPHS PCT: 11 %
Lymphs Abs: 0.7 10*3/uL (ref 0.7–4.0)
MCH: 32.3 pg (ref 26.0–34.0)
MCHC: 31.7 g/dL (ref 30.0–36.0)
MCV: 101.9 fL — ABNORMAL HIGH (ref 80.0–100.0)
Monocytes Absolute: 0.4 10*3/uL (ref 0.1–1.0)
Monocytes Relative: 6 %
Neutro Abs: 4.8 10*3/uL (ref 1.7–7.7)
Neutrophils Relative %: 81 %
Platelets: 219 10*3/uL (ref 150–400)
RBC: 4.31 MIL/uL (ref 4.22–5.81)
RDW: 13.3 % (ref 11.5–15.5)
WBC: 6 10*3/uL (ref 4.0–10.5)
nRBC: 0 % (ref 0.0–0.2)

## 2018-06-06 LAB — RAPID URINE DRUG SCREEN, HOSP PERFORMED
Amphetamines: NOT DETECTED
Barbiturates: NOT DETECTED
Benzodiazepines: NOT DETECTED
Cocaine: POSITIVE — AB
OPIATES: NOT DETECTED
TETRAHYDROCANNABINOL: NOT DETECTED

## 2018-06-06 LAB — URINALYSIS, ROUTINE W REFLEX MICROSCOPIC
Bacteria, UA: NONE SEEN
Bilirubin Urine: NEGATIVE
Glucose, UA: >=500 mg/dL — AB
Hgb urine dipstick: NEGATIVE
Ketones, ur: 20 mg/dL — AB
Leukocytes,Ua: NEGATIVE
Nitrite: NEGATIVE
Protein, ur: NEGATIVE mg/dL
SPECIFIC GRAVITY, URINE: 1.024 (ref 1.005–1.030)
pH: 5 (ref 5.0–8.0)

## 2018-06-06 LAB — INFLUENZA PANEL BY PCR (TYPE A & B)
Influenza A By PCR: NEGATIVE
Influenza B By PCR: NEGATIVE

## 2018-06-06 LAB — MAGNESIUM: MAGNESIUM: 1.8 mg/dL (ref 1.7–2.4)

## 2018-06-06 LAB — OSMOLALITY: Osmolality: 328 mOsm/kg (ref 275–295)

## 2018-06-06 LAB — PHOSPHORUS: Phosphorus: 4.4 mg/dL (ref 2.5–4.6)

## 2018-06-06 LAB — TROPONIN I: Troponin I: <0.03 ng/mL (ref <0.03)

## 2018-06-06 MED ORDER — GABAPENTIN 300 MG PO CAPS
300.0000 mg | ORAL_CAPSULE | Freq: Three times a day (TID) | ORAL | Status: DC
  Administered 2018-06-06 – 2018-06-07 (×3): 300 mg via ORAL
  Filled 2018-06-06 (×3): qty 1

## 2018-06-06 MED ORDER — PANTOPRAZOLE SODIUM 40 MG PO TBEC
40.0000 mg | DELAYED_RELEASE_TABLET | Freq: Every day | ORAL | Status: DC
  Administered 2018-06-06 – 2018-06-07 (×2): 40 mg via ORAL
  Filled 2018-06-06 (×2): qty 1

## 2018-06-06 MED ORDER — SODIUM CHLORIDE 0.9 % IV BOLUS
1000.0000 mL | Freq: Once | INTRAVENOUS | Status: AC
  Administered 2018-06-06: 1000 mL via INTRAVENOUS

## 2018-06-06 MED ORDER — ONDANSETRON HCL 4 MG/2ML IJ SOLN: 4.0000 mg | Freq: Four times a day (QID) | INTRAMUSCULAR | Status: DC | PRN

## 2018-06-06 MED ORDER — INSULIN ASPART 100 UNIT/ML ~~LOC~~ SOLN
0.0000 [IU] | Freq: Three times a day (TID) | SUBCUTANEOUS | Status: DC
  Administered 2018-06-07: 3 [IU] via SUBCUTANEOUS
  Administered 2018-06-07: 2 [IU] via SUBCUTANEOUS

## 2018-06-06 MED ORDER — ACETAMINOPHEN 325 MG PO TABS: 650.0000 mg | ORAL_TABLET | Freq: Four times a day (QID) | ORAL | Status: DC | PRN

## 2018-06-06 MED ORDER — INSULIN ASPART 100 UNIT/ML ~~LOC~~ SOLN: 0.0000 [IU] | Freq: Every day | SUBCUTANEOUS | Status: DC

## 2018-06-06 MED ORDER — INSULIN REGULAR(HUMAN) IN NACL 100-0.9 UT/100ML-% IV SOLN
INTRAVENOUS | Status: DC
  Administered 2018-06-06: 5.4 [IU]/h via INTRAVENOUS
  Filled 2018-06-06: qty 100

## 2018-06-06 MED ORDER — CHLORHEXIDINE GLUCONATE CLOTH 2 % EX PADS
6.0000 | MEDICATED_PAD | Freq: Every day | CUTANEOUS | Status: DC
  Administered 2018-06-07: 6 via TOPICAL

## 2018-06-06 MED ORDER — SODIUM CHLORIDE 0.9 % IV SOLN
INTRAVENOUS | Status: DC
  Administered 2018-06-06 – 2018-06-07 (×3): via INTRAVENOUS

## 2018-06-06 MED ORDER — ADULT MULTIVITAMIN W/MINERALS CH
1.0000 | ORAL_TABLET | Freq: Every day | ORAL | Status: DC
  Administered 2018-06-06 – 2018-06-07 (×2): 1 via ORAL
  Filled 2018-06-06 (×2): qty 1

## 2018-06-06 MED ORDER — ONDANSETRON HCL 4 MG PO TABS: 4.0000 mg | ORAL_TABLET | Freq: Four times a day (QID) | ORAL | Status: DC | PRN

## 2018-06-06 MED ORDER — INSULIN DETEMIR 100 UNIT/ML ~~LOC~~ SOLN
12.0000 [IU] | Freq: Two times a day (BID) | SUBCUTANEOUS | Status: DC
  Administered 2018-06-06 – 2018-06-07 (×2): 12 [IU] via SUBCUTANEOUS
  Filled 2018-06-06 (×4): qty 0.12

## 2018-06-06 MED ORDER — OXYCODONE HCL 5 MG PO TABS
5.0000 mg | ORAL_TABLET | Freq: Four times a day (QID) | ORAL | Status: DC | PRN
  Administered 2018-06-06 – 2018-06-07 (×3): 5 mg via ORAL
  Filled 2018-06-06 (×3): qty 1

## 2018-06-06 MED ORDER — HEPARIN SODIUM (PORCINE) 5000 UNIT/ML IJ SOLN
5000.0000 [IU] | Freq: Three times a day (TID) | INTRAMUSCULAR | Status: DC
  Administered 2018-06-06: 5000 [IU] via SUBCUTANEOUS
  Filled 2018-06-06 (×2): qty 1

## 2018-06-06 MED ORDER — NICOTINE 14 MG/24HR TD PT24
14.0000 mg | MEDICATED_PATCH | Freq: Every day | TRANSDERMAL | Status: DC
  Filled 2018-06-06 (×2): qty 1

## 2018-06-06 NOTE — ED Provider Notes (Signed)
[facility] EMERGENCY DEPARTMENT Provider Note   CSN: [id number] Arrival date & time: 06/06/18  1157    History   Chief Complaint Chief Complaint  Patient presents with  . Weakness    HPI Kenneth [name] is a 53 y.o. male.  53 year old male with history of diabetes poorly controlled and multiple admissions for DKA here with the few days of elevated blood sugars in the setting of headaches body aches cough minimally productive and frequent urination.  He is on insulin injections and studies been using his medications regularly.  Said usually his sugars are in the 3 and 400s but he was getting critical highs.  Some nausea no abdominal pain.  He is having diarrhea.  No blood from either end.  Denies fever.  Patient is homeless.     The history is provided by the patient.  Hyperglycemia  Blood sugar level PTA:  High Severity:  Severe Onset quality:  Gradual Timing:  Constant Progression:  Unchanged Chronicity:  Recurrent Diabetes status:  Controlled with insulin Context: not insulin pump use   Relieved by:  None tried Ineffective treatments:  None tried Associated symptoms: dehydration, dizziness, fatigue, increased thirst, malaise, nausea and weakness   Associated symptoms: no abdominal pain, no chest pain, no dysuria, no fever, no shortness of breath, no syncope and no vomiting   Risk factors: hx of DKA     Past Medical History:  Diagnosis Date  . Arthritis   . Chronic generalized pain   . Diabetes mellitus   . DKA (diabetic ketoacidosis) ([place]) 07/2014  . Malnutrition ([place])   . Neuropathy   . Noncompliance with medication regimen   . Thyroid disease     Patient Active Problem List   Diagnosis Date Noted  . Malnutrition of moderate degree 10/19/2017  . Severe sepsis with septic shock (CODE) ([place]) 10/16/2017  . AKI (acute kidney injury) ([place]) 10/16/2017  . Esophagitis 10/16/2017  . Hypotension 06/16/2017  . Cocaine abuse ([place])   . Homeless   . Lactic acidosis   .  DKA, type 2 ([place]) 06/22/2016  . Non compliance w medication regimen   . Protein-calorie malnutrition, severe 06/11/2016  . Chronic generalized pain 04/26/2016  . Neuropathy 04/26/2016  . Diabetes mellitus type 2, uncontrolled, with complications ([place])   . MDD (major depressive disorder), recurrent severe, without psychosis ([place]) 03/27/2016  . Depression, recurrent ([place])   . Abdominal pain   . Hyperglycemia 02/24/2016  . Volume depletion 12/21/2015  . Metabolic acidosis 01/75/1025  . Diabetes mellitus with nonketotic hyperosmolarity ([place]) 09/21/2014  . Noncompliance with medication regimen   . Diabetes mellitus ([place])   . DKA (diabetic ketoacidoses) ([place]) 07/24/2014  . Dehydration 07/24/2014  . Acute renal failure ([place]) 07/24/2014  . Hyperkalemia 07/24/2014  . Hyponatremia 07/24/2014  . Hyperthyroidism 07/24/2014    History reviewed. No pertinent surgical history.      Home Medications    Prior to Admission medications   Medication Sig Start Date End Date Taking? Authorizing Provider  blood glucose meter kit and supplies Dispense based on patient and insurance preference. Use up to four times daily as directed. (FOR ICD-10 E10.9, E11.9). 05/04/18   Johnson, Clanford L, MD  gabapentin (NEURONTIN) 300 MG capsule Take 300 mg by mouth 4 (four) times daily.  03/25/16   [provider]  insulin aspart (NOVOLOG) 100 UNIT/ML injection Inject 8 Units into the skin 3 (three) times daily with meals for 30 days. 05/04/18 06/03/18  Murlean Iba, MD  insulin glargine (LANTUS) 100 UNIT/ML injection Inject 0.25 mLs (25 Units total) into the skin daily. 05/04/18   Johnson, Clanford L, MD  INSULIN SYRINGE .5CC/29G 29G X 1/2" 0.5 ML MISC Use as directed 05/04/18   Johnson, Clanford L, MD  lisinopril (PRINIVIL,ZESTRIL) 2.5 MG tablet Take 1 tablet (2.5 mg total) by mouth daily for 30 days. 05/05/18 06/04/18  Murlean Iba, MD    Family History Family History  Problem Relation Age of  Onset  . Diabetes Mother   . Seizures Sister   . Diabetes Sister     Social History Social History   Tobacco Use  . Smoking status: Current Every Day Smoker    Packs/day: 0.50    Years: 8.00    Pack years: 4.00  . Smokeless tobacco: Never Used  Substance Use Topics  . Alcohol use: No  . Drug use: No     Allergies   Metformin and related and Nsaids   Review of Systems Review of Systems  Constitutional: Positive for fatigue. Negative for fever.  HENT: Negative for sore throat.   Eyes: Negative for pain.  Respiratory: Negative for shortness of breath.   Cardiovascular: Negative for chest pain and syncope.  Gastrointestinal: Positive for nausea. Negative for abdominal pain and vomiting.  Endocrine: Positive for polydipsia.  Genitourinary: Positive for frequency. Negative for dysuria.  Musculoskeletal: Positive for myalgias. Negative for neck pain.  Skin: Negative for rash.  Neurological: Positive for dizziness, weakness and headaches.     Physical Exam Updated Vital Signs BP 117/74 (BP Location: Right Arm)   Pulse 82   Temp (!) 97.4 F (36.3 C) (Oral)   Resp 18   Ht 5' 9" (1.753 m)   Wt 63.5 kg   SpO2 100%   BMI 20.67 kg/m   Physical Exam Vitals signs and nursing note reviewed.  Constitutional:      Appearance: He is well-developed.  HENT:     Head: Normocephalic and atraumatic.  Eyes:     Conjunctiva/sclera: Conjunctivae normal.  Neck:     Musculoskeletal: Neck supple.  Cardiovascular:     Rate and Rhythm: Normal rate and regular rhythm.     Heart sounds: No murmur.  Pulmonary:     Effort: Pulmonary effort is normal. No respiratory distress.     Breath sounds: Normal breath sounds.  Abdominal:     Palpations: Abdomen is soft.     Tenderness: There is no abdominal tenderness.  Musculoskeletal: Normal range of motion.     Right lower leg: No edema.     Left lower leg: No edema.  Skin:    General: Skin is warm and dry.     Capillary Refill:  Capillary refill takes less than 2 seconds.  Neurological:     General: No focal deficit present.     Mental Status: He is alert and oriented to person, place, and time.     Gait: Gait normal.      ED Treatments / Results  Labs (all labs ordered are listed, but only abnormal results are displayed) Labs Reviewed  URINALYSIS, ROUTINE W REFLEX MICROSCOPIC - Abnormal; Notable for the following components:      Result Value   Color, Urine STRAW (*)    Glucose, UA >=500 (*)    Ketones, ur 20 (*)    All other components within normal limits  COMPREHENSIVE METABOLIC PANEL - Abnormal; Notable for the following components:   Sodium 121 (*)    Potassium 5.5 (*)  Chloride 83 (*)    CO2 21 (*)    Glucose, Bld 1,040 (*)    BUN 28 (*)    Calcium 8.8 (*)    Total Protein 8.3 (*)    AST 59 (*)    ALT 54 (*)    Anion gap 17 (*)    All other components within normal limits  CBC WITH DIFFERENTIAL/PLATELET - Abnormal; Notable for the following components:   MCV 101.9 (*)    All other components within normal limits  OSMOLALITY - Abnormal; Notable for the following components:   Osmolality 328 (*)    All other components within normal limits  RAPID URINE DRUG SCREEN, HOSP PERFORMED - Abnormal; Notable for the following components:   Cocaine POSITIVE (*)    All other components within normal limits  GLUCOSE, CAPILLARY - Abnormal; Notable for the following components:   Glucose-Capillary >600 (*)    All other components within normal limits  GLUCOSE, CAPILLARY - Abnormal; Notable for the following components:   Glucose-Capillary >600 (*)    All other components within normal limits  GLUCOSE, CAPILLARY - Abnormal; Notable for the following components:   Glucose-Capillary 290 (*)    All other components within normal limits  BASIC METABOLIC PANEL - Abnormal; Notable for the following components:   Glucose, Bld 181 (*)    Calcium 8.3 (*)    All other components within normal limits  CBC  - Abnormal; Notable for the following components:   RBC 3.56 (*)    Hemoglobin 11.4 (*)    HCT 34.0 (*)    All other components within normal limits  BASIC METABOLIC PANEL - Abnormal; Notable for the following components:   Potassium 3.1 (*)    Glucose, Bld 125 (*)    BUN 23 (*)    Calcium 8.5 (*)    All other components within normal limits  GLUCOSE, CAPILLARY - Abnormal; Notable for the following components:   Glucose-Capillary 131 (*)    All other components within normal limits  GLUCOSE, CAPILLARY - Abnormal; Notable for the following components:   Glucose-Capillary 199 (*)    All other components within normal limits  GLUCOSE, CAPILLARY - Abnormal; Notable for the following components:   Glucose-Capillary 155 (*)    All other components within normal limits  CBG MONITORING, ED - Abnormal; Notable for the following components:   Glucose-Capillary >600 (*)    All other components within normal limits  CBG MONITORING, ED - Abnormal; Notable for the following components:   Glucose-Capillary >600 (*)    All other components within normal limits  URINE CULTURE  MRSA PCR SCREENING  INFLUENZA PANEL BY PCR (TYPE A & B)  MAGNESIUM  PHOSPHORUS  TROPONIN I  GLUCOSE, CAPILLARY  GLUCOSE, CAPILLARY  BLOOD GAS, VENOUS  HEMOGLOBIN A1C    EKG None  Radiology Dg Chest 2 View  Result Date: 06/06/2018 CLINICAL DATA:  Cough EXAM: CHEST - 2 VIEW COMPARISON:  05/03/2018 FINDINGS: The heart size and mediastinal contours are within normal limits. Both lungs are clear. The visualized skeletal structures are unremarkable. IMPRESSION: No active cardiopulmonary disease. Electronically Signed   By: Franchot Gallo M.D.   On: 06/06/2018 13:23    Procedures .Critical Care Performed by: Hayden Rasmussen, MD Authorized by: Hayden Rasmussen, MD   Critical care provider statement:    Critical care time (minutes):  45   Critical care time was exclusive of:  Separately billable procedures and  treating other patients  Critical care was necessary to treat or prevent imminent or life-threatening deterioration of the following conditions:  Metabolic crisis   Critical care was time spent personally by me on the following activities:  Discussions with consultants, evaluation of patient's response to treatment, examination of patient, ordering and performing treatments and interventions, ordering and review of laboratory studies, ordering and review of radiographic studies, pulse oximetry, re-evaluation of patient's condition, obtaining history from patient or surrogate, review of old charts and development of treatment plan with patient or surrogate   I assumed direction of critical care for this patient from another provider in my specialty: no     (including critical care time)  Medications Ordered in ED Medications  sodium chloride 0.9 % bolus 1,000 mL (1,000 mLs Intravenous New Bag/Given 06/06/18 1400)    And  0.9 %  sodium chloride infusion ( Intravenous New Bag/Given 06/07/18 0558)  heparin injection 5,000 Units (5,000 Units Subcutaneous Not Given 06/07/18 0600)  gabapentin (NEURONTIN) capsule 300 mg (300 mg Oral Given 06/07/18 0919)  acetaminophen (TYLENOL) tablet 650 mg (has no administration in time range)  Chlorhexidine Gluconate Cloth 2 % PADS 6 each (6 each Topical Given 06/07/18 0600)  oxyCODONE (Oxy IR/ROXICODONE) immediate release tablet 5 mg (5 mg Oral Given 06/07/18 0119)  pantoprazole (PROTONIX) EC tablet 40 mg (40 mg Oral Given 06/07/18 0920)  nicotine (NICODERM CQ - dosed in mg/24 hours) patch 14 mg (14 mg Transdermal Not Given 06/07/18 0922)  ondansetron (ZOFRAN) tablet 4 mg (has no administration in time range)    Or  ondansetron (ZOFRAN) injection 4 mg (has no administration in time range)  multivitamin with minerals tablet 1 tablet (1 tablet Oral Given 06/07/18 0920)  insulin aspart (novoLOG) injection 0-15 Units (3 Units Subcutaneous Given 06/07/18 1031)  insulin aspart  (novoLOG) injection 0-5 Units (0 Units Subcutaneous Not Given 06/06/18 2144)  insulin detemir (LEVEMIR) injection 12 Units (12 Units Subcutaneous Given 06/06/18 2144)  sodium chloride 0.9 % bolus 1,000 mL (0 mLs Intravenous Stopped 06/06/18 1326)     Initial Impression / Assessment and Plan / ED Course  I have reviewed the triage vital signs and the nursing notes.  Pertinent labs & imaging results that were available during my care of the patient were reviewed by me and considered in my medical decision making (see chart for details).  Clinical Course as of Jun 07 955  Tue Jun 06, 6554  5254 53 year old male diabetic complicated by homelessness here with elevated blood sugars.  Complaining of headache body aches fatigue polyuria polydipsia.  History of DKA.  He is getting IV fluids and some screening labs and flu test.  Differential includes flu, pneumonia, DKA, metabolic derangement.   [MB]  5945 Patient's chemistry markedly abnormal with a sodium of 121, glucose of 1040, and a gap of 17.  We will start him on fluids and a insulin drip.  This is likely HHNS although he does have some ketones in urine and a gap. Hospitalist paged for admission.    [MB]  1351 Discussed with Dr. Dyann Kief from the hospitalist service who will evaluate him for admission.  Patient updated on labs and plan.   [MB]    Clinical Course User Index [MB] Hayden Rasmussen, MD        Final Clinical Impressions(s) / ED Diagnoses   Final diagnoses:  Diabetic ketoacidosis without coma associated with type 2 diabetes mellitus [facility])    ED Discharge Orders    None  Hayden Rasmussen, MD 06/07/18 [phone]

## 2018-06-06 NOTE — Progress Notes (Signed)
CRITICAL VALUE ALERT  Critical Value: serum osmolality 328  Date & Time Notied:  06-06-2018 2015  Provider Notified: Craige Cotta  Orders Received/Actions taken: awaiting any new orders

## 2018-06-06 NOTE — Progress Notes (Signed)
**Note De-identified  Obfuscation** EKG complete and placed in patient chart 
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
## 2018-06-06 NOTE — ED Notes (Signed)
Date and time results received: 06/06/18 1:36 PM  Test: glucose Critical Value: 1040  Name of Provider Notified: Charm Barges  Orders Received? Or Actions Taken?: Orders placed

## 2018-06-06 NOTE — H&P (Signed)
History and Physical    Kenneth [name]:[id number] DOB: 06-23-1965 DOA: 06/06/2018  Referring MD/NP/PA: Dr. Melina Copa PCP: [name], MD  Patient coming from: Home  Chief Complaint: Generalized fatigue and not feeling good.  Patient with elevated blood sugar.  HPI: Kenneth [name] is a 53 y.o. male with a past medical history significant for chronic generalized pain, type 2 diabetes mellitus insulin-dependent (poorly controlled), diabetic neuropathy, medication noncompliance, prior history of DKA's admissions, GERD and hypertension; who presented to the emergency department secondary to not feeling well, generalized fatigue and drain.  He reported noticing high on his glucometer for the last 2 days.  She reports poor oral intake and nausea.  No fever, no chest pain, no shortness of breath, positive nonproductive intermittent cough (unchanged from baseline given history of chronic tobacco abuse), no vomiting, no abdominal pain, no dysuria, no hematuria, no melena, no hematochezia, no focal weakness, no headaches, no blurred vision or any other complaints.  In the ED chest x-ray negative for underlying cardiopulmonary process, influenza PCR negative, no fever, no leukopenia or lymphopenia, no recent travel or sick contacts to his knowledge.  Blood work demonstrated hyponatremia, hyperkalemia and CBGs in the 1040.  Anion gap just barely elevated at 17, CO2 21.  IV fluid bolus using normal saline given and patient started on insulin drip.  TRH consulted to admit patient for further evaluation and management of hyperosmolar state.  Past Medical/Surgical History: Past Medical History:  Diagnosis Date  . Arthritis   . Chronic generalized pain   . Diabetes mellitus   . DKA (diabetic ketoacidosis) ([place]) 07/2014  . Malnutrition ([place])   . Neuropathy   . Noncompliance with medication regimen   . Thyroid disease     History reviewed. No pertinent surgical history.  Social History:  reports that  he has been smoking. He has a 4.00 pack-year smoking history. He has never used smokeless tobacco. He reports that he does not drink alcohol or use drugs.  Allergies: Allergies  Allergen Reactions  . Metformin And Related Other (See Comments)    Abdominal cramps and diarrhea  . Nsaids Nausea Only    Family History:  Family History  Problem Relation Age of Onset  . Diabetes Mother   . Seizures Sister   . Diabetes Sister     Prior to Admission medications   Medication Sig Start Date End Date Taking? Authorizing Provider  blood glucose meter kit and supplies Dispense based on patient and insurance preference. Use up to four times daily as directed. (FOR ICD-10 E10.9, E11.9). 05/04/18  Yes Johnson, Clanford L, MD  insulin aspart (NOVOLOG) 100 UNIT/ML injection Inject 8 Units into the skin 3 (three) times daily with meals for 30 days. Patient taking differently: Inject 10 Units into the skin 3 (three) times daily with meals.  05/04/18 06/06/18 Yes Johnson, Clanford L, MD  INSULIN SYRINGE .5CC/29G 29G X 1/2" 0.5 ML MISC Use as directed 05/04/18  Yes Johnson, Clanford L, MD  lisinopril (PRINIVIL,ZESTRIL) 2.5 MG tablet Take 1 tablet (2.5 mg total) by mouth daily for 30 days. 05/05/18 06/06/18 Yes Johnson, Clanford L, MD  oxyCODONE (OXY IR/ROXICODONE) 5 MG immediate release tablet Take 5 mg by mouth 3 (three) times daily.   Yes [provider]  pantoprazole (PROTONIX) 40 MG tablet Take 40 mg by mouth daily.   Yes [provider]  gabapentin (NEURONTIN) 300 MG capsule Take 300 mg by mouth 4 (four) times daily.  03/25/16   [provider]  insulin glargine (LANTUS) 100 UNIT/ML injection Inject 0.25 mLs (25 Units total) into the skin daily. 05/04/18   Murlean Iba, MD    Review of Systems:  Negative except as otherwise mentioned in HPI.   Physical Exam: Vitals:   06/06/18 1400 06/06/18 1445 06/06/18 1500 06/06/18 1600  BP: 117/66 107/71 111/71   Pulse: 76 83 78    Resp: _0 Temp:    98.1 F (36.7 C)  TempSrc:    Oral  SpO2: 99% 100% 100%   Weight:  57.5 kg    Height:  _1  (1.753 m)     Constitutional: NAD, calm, comfortable; afebrile.  Reporting chronic lower extremity pain. Eyes: PERRL, lids and conjunctivae normal; no icterus, no nystagmus. ENMT: Mucous membranes are moist. Posterior pharynx clear of any exudate or lesions. Neck: normal, supple, no masses, no thyromegaly Respiratory: clear to auscultation bilaterally, no wheezing, no crackles. Normal respiratory effort. No accessory muscle use.  Cardiovascular: Regular rate and rhythm, no murmurs / rubs / gallops. No extremity edema. 2+ pedal pulses. No carotid bruits.  Abdomen: no tenderness, no masses palpated. No hepatosplenomegaly. Bowel sounds positive.  Musculoskeletal: no clubbing / cyanosis. No joint deformity upper and lower extremities. Good ROM, no contractures. Normal muscle tone.  Skin: no rashes, lesions, ulcers. No induration Neurologic: CN 2-12 grossly intact. Sensation intact, DTR normal. Strength 5/5 in all 4.  Psychiatric: Normal judgment and insight. Alert and oriented x 3. Normal mood.    Labs on Admission: I have personally reviewed the following labs and imaging studies  CBC: Recent Labs  Lab 06/06/18 1230  WBC 6.0  NEUTROABS 4.8  HGB 13.9  HCT 43.9  MCV 101.9*  PLT 916   Basic Metabolic Panel: Recent Labs  Lab 06/06/18 1230  NA 121*  K 5.5*  CL 83*  CO2 21*  GLUCOSE 1,040*  BUN 28*  CREATININE 1.11  CALCIUM 8.8*   GFR: Estimated Creatinine Clearance: 61.9 mL/min (by C-G formula based on SCr of 1.11 mg/dL).   Liver Function Tests: Recent Labs  Lab 06/06/18 1230  AST 59*  ALT 54*  ALKPHOS 124  BILITOT 0.8  PROT 8.3*  ALBUMIN 4.6   CBG: Recent Labs  Lab 06/06/18 1218 06/06/18 1352 06/06/18 1457 06/06/18 1536 06/06/18 1647  GLUCAP >600* >600* >600* >600* 290*   Urine analysis:    Component Value Date/Time   COLORURINE  STRAW (A) 06/06/2018 1231   APPEARANCEUR CLEAR 06/06/2018 1231   LABSPEC 1.024 06/06/2018 1231   PHURINE 5.0 06/06/2018 1231   GLUCOSEU >=500 (A) 06/06/2018 1231   HGBUR NEGATIVE 06/06/2018 1231   BILIRUBINUR NEGATIVE 06/06/2018 1231   KETONESUR 20 (A) 06/06/2018 1231   PROTEINUR NEGATIVE 06/06/2018 1231   UROBILINOGEN 0.2 01/18/2015 2300   NITRITE NEGATIVE 06/06/2018 1231   LEUKOCYTESUR NEGATIVE 06/06/2018 1231   Radiological Exams on Admission: Dg Chest 2 View  Result Date: 06/06/2018 CLINICAL DATA:  Cough EXAM: CHEST - 2 VIEW COMPARISON:  05/03/2018 FINDINGS: The heart size and mediastinal contours are within normal limits. Both lungs are clear. The visualized skeletal structures are unremarkable. IMPRESSION: No active cardiopulmonary disease. Electronically Signed   By: Franchot Gallo M.D.   On: 06/06/2018 13:23    EKG: None  Assessment/Plan 1-hyperosmolar non-ketotic state in patient with type 2 diabetes mellitus ([place]). -In the setting of medication noncompliance. -Patient will be treated with IV insulin drip and subsequent transition to long-acting and sliding scale insulin. -N.p.o. except for  sips with meds and noncaloric drinks for now. -Fluid resuscitation provided normal saline 125 cc/h. -Close monitoring of his CBGs and electrolytes. -No signs of active infection appreciated. -Patient denies chest pain; no ischemic changes on telemetry. -with high concerns for development of DKA without intervention. -Mild elevation on anion gap; normal CO2.  2-Dehydration and hyponatremia -in the setting of hyperglycemia -will provide IVF's resuscitation -control CBG's -follow electrolytes  3-Hyperkalemia -mild -most likely associated with dehydration, chronic use of ACE and hyperglycemic setting -no EKG changes -will monitor on telemetry -patient would received treatment with IV insulin -follow electrolytes  4-Noncompliance with medication regimen -Education about  importance of medication compliance provided.  5-Neuropathy -Resume Neurontin -Continue PRN oxycodone.  6-tobacco abuse and cocaine abuse ([place]) -Cessation counseling has been provided -UDS positive for cocaine during this admission. -Nicotine patch has been ordered.    7-Esophagitis -Continue PPI.   DVT prophylaxis: Heparin Code Status: Full code Family Communication: No family at bedside. Disposition Plan: Anticipate discharge on 06/07/2018 once blood sugar is controlled.  Most likely will go back to shelter; case manager/social worker consulted. Consults called: None Admission status: Observation, stepdown, length of stay less than 2 midnights.   Time Spent: 60 minutes  Barton Dubois MD [facility] Pager [phone]  06/06/2018, 5:53 PM

## 2018-06-06 NOTE — ED Notes (Signed)
Pt back from x-ray.

## 2018-06-06 NOTE — Progress Notes (Signed)
Patient admitted to [facility] ICU on insulin gtt. Patient's CBG down from >600 to 290 in 1 hour while titrating insulin gtt per glucostabilizer. Patient has all of his belongings at bedside and states that he was evicted from his home today due to not paying rent, which patient states that he did pay. Questioned if patient was giving himself any insulin along with the insulin he is receiving with the IV infusion. Patient states that he is not. Continued to give patient IV insulin infusion and notified Dr. Gwenlyn Perking that patient's blood glucose is dropping rapidly. Prior to change of shift, report was given to night shift RN and CBG checked. Patient's CBG 74. Stopped insulin gtt per glucostabilizer and notified Dr. Gwenlyn Perking of CBG 74. Orders to transition off of insulin gtt placed. NIght shift RN aware.

## 2018-06-07 DIAGNOSIS — E860 Dehydration: Secondary | ICD-10-CM | POA: Diagnosis not present

## 2018-06-07 DIAGNOSIS — E1110 Type 2 diabetes mellitus with ketoacidosis without coma: Secondary | ICD-10-CM | POA: Diagnosis not present

## 2018-06-07 DIAGNOSIS — E1100 Type 2 diabetes mellitus with hyperosmolarity without nonketotic hyperglycemic-hyperosmolar coma (NKHHC): Secondary | ICD-10-CM | POA: Diagnosis not present

## 2018-06-07 DIAGNOSIS — F1410 Cocaine abuse, uncomplicated: Secondary | ICD-10-CM | POA: Diagnosis not present

## 2018-06-07 LAB — CBC
HCT: 34 % — ABNORMAL LOW (ref 39.0–52.0)
Hemoglobin: 11.4 g/dL — ABNORMAL LOW (ref 13.0–17.0)
MCH: 32 pg (ref 26.0–34.0)
MCHC: 33.5 g/dL (ref 30.0–36.0)
MCV: 95.5 fL (ref 80.0–100.0)
NRBC: 0 % (ref 0.0–0.2)
Platelets: 188 10*3/uL (ref 150–400)
RBC: 3.56 MIL/uL — ABNORMAL LOW (ref 4.22–5.81)
RDW: 13 % (ref 11.5–15.5)
WBC: 5.9 10*3/uL (ref 4.0–10.5)

## 2018-06-07 LAB — BASIC METABOLIC PANEL
Anion gap: 5 (ref 5–15)
BUN: 17 mg/dL (ref 6–20)
CO2: 27 mmol/L (ref 22–32)
Calcium: 8.3 mg/dL — ABNORMAL LOW (ref 8.9–10.3)
Chloride: 106 mmol/L (ref 98–111)
Creatinine, Ser: 0.65 mg/dL (ref 0.61–1.24)
GFR calc Af Amer: >60 mL/min (ref >60)
GFR calc non Af Amer: >60 mL/min (ref >60)
Glucose, Bld: 181 mg/dL — ABNORMAL HIGH (ref 70–99)
Potassium: 3.9 mmol/L (ref 3.5–5.1)
SODIUM: 138 mmol/L (ref 135–145)

## 2018-06-07 LAB — GLUCOSE, CAPILLARY
Glucose-Capillary: 155 mg/dL — ABNORMAL HIGH (ref 70–99)
Glucose-Capillary: 145 mg/dL — ABNORMAL HIGH (ref 70–99)

## 2018-06-07 LAB — URINE CULTURE: Culture: NO GROWTH

## 2018-06-07 MED ORDER — INSULIN GLARGINE 100 UNIT/ML ~~LOC~~ SOLN: 15.0000 [IU] | Freq: Two times a day (BID) | SUBCUTANEOUS | 1 refills | Status: DC

## 2018-06-07 MED ORDER — NICOTINE 14 MG/24HR TD PT24: 14.0000 mg | MEDICATED_PATCH | Freq: Every day | TRANSDERMAL | 1 refills | Status: DC

## 2018-06-07 MED ORDER — GABAPENTIN 300 MG PO CAPS: 300.0000 mg | ORAL_CAPSULE | Freq: Four times a day (QID) | ORAL | 1 refills | Status: DC

## 2018-06-07 MED ORDER — ADULT MULTIVITAMIN W/MINERALS CH: 1.0000 | ORAL_TABLET | Freq: Every day | ORAL | 1 refills | Status: AC

## 2018-06-07 MED ORDER — INSULIN ASPART 100 UNIT/ML ~~LOC~~ SOLN: 10.0000 [IU] | Freq: Three times a day (TID) | SUBCUTANEOUS | 2 refills | Status: DC

## 2018-06-07 NOTE — Discharge Summary (Signed)
Physician Discharge Summary  Kenneth [name]:[id number] DOB: [birth year] DOA: 06/06/2018  PCP: [name], MD  Admit date: 06/06/2018 Discharge date: 06/07/2018  Time spent: 35 minutes  Recommendations for Outpatient Follow-up:  1. Repeat basic metabolic panel to follow electrolytes and renal function 2. Close follow-up to patient CBGs with further adjustment to hypoglycemic regimen as needed.   Discharge Diagnoses:  Principal Problem:   Hyperosmolar non-ketotic state in patient with type 2 diabetes mellitus ([place]) Active Problems:   Dehydration   Hyperkalemia   Hyponatremia   Noncompliance with medication regimen   Neuropathy   Cocaine abuse ([place])   Esophagitis   Tobacco abuse   Discharge Condition: Stable and improved.  Discharged home with instructions to follow-up with PCP in 1 week.  Diet recommendation: Modified carbohydrate diet  Filed Weights   06/06/18 1215 06/06/18 1445 06/07/18 0500  Weight: 63.5 kg 57.5 kg 57.6 kg    History of present illness:  53 y.o. male with a past medical history significant for chronic generalized pain, type 2 diabetes mellitus insulin-dependent (poorly controlled), diabetic neuropathy, medication noncompliance, prior history of DKA's admissions, GERD and hypertension; who presented to the emergency department secondary to not feeling well, generalized fatigue and drain.  He reported noticing high on his glucometer for the last 2 days.  She reports poor oral intake and nausea.  No fever, no chest pain, no shortness of breath, positive nonproductive intermittent cough (unchanged from baseline given history of chronic tobacco abuse), no vomiting, no abdominal pain, no dysuria, no hematuria, no melena, no hematochezia, no focal weakness, no headaches, no blurred vision or any other complaints.  In the ED chest x-ray negative for underlying cardiopulmonary process, influenza PCR negative, no fever, no leukopenia or lymphopenia, no recent  travel or sick contacts to his knowledge.  Blood work demonstrated hyponatremia, hyperkalemia and CBGs in the 1040.  Anion gap just barely elevated at 17, CO2 21.  IV fluid bolus using normal saline given and patient started on insulin drip.  TRH consulted to admit patient for further evaluation and management of hyperosmolar state.  Hospital Course:  1-hyperosmolar nonketotic state in patient with type 2 diabetes mellitus -Appears to be secondary to medication noncompliance -No DKA appreciated at this time -Negative troponin and no source of infection appreciated. -After fluid resuscitation and IV insulin patient hyperosmolar/hyperglycemic state was corrected. -Adjustment has been done to patient's insulin therapy and instruction provided to maintain adequate hydration and follow modified carbohydrate diet. -Patient with elevated A1c at 11 range at the end of last month; demonstrating poor compliance and control of his diabetes. -Will need close follow-up with PCP.  2-Dehydration and hyponatremia -In the setting of hyperglycemic state and poor oral hydration. -Resolved with IV fluids and control of his CBGs -Repeat basic metabolic panel at follow-up visit.  3-hyperkalemia -Mild and most likely associated with dehydration and chronic use of ACE inhibitors in the setting of hyperglycemic state. -At discharge potassium within normal limits -During hospitalization no changes appreciated on telemetry.  4-medication Noncompliance -Extensively educated about importance of medication compliance.  5-neuropathy -Resume Neurontin -Continue as needed oxycodone.  6-tobacco abuse and cocaine abuse -Cessation counseling has been provided -UDS positive for cocaine during this admission. -Discharged on nicotine patch.  7-gastroesophageal reflux disease/esophagitis -Continue PPI.   Procedures: See below for x-ray reports  Consultations:  None  Discharge Exam: Vitals:   06/07/18 1100  06/07/18 1142  BP: 91/64   Pulse: 67   Resp: 18   Temp:  98.6 F (37 C)  SpO2: 100%     General: Afebrile, no chest pain, no nausea, no vomiting.  Patient reports being hungry with good appetite and wanting to have increase in the amount of food provided. Cardiovascular: S1 and S2, no rubs, no gallops, no murmurs. Respiratory: Clear to auscultation bilaterally, except for scattered rhonchi. Abdomen: Soft, nontender, nondistended, positive bowel sounds Extremities: No edema, no cyanosis or clubbing.  Discharge Instructions   Discharge Instructions    Diet Carb Modified   Complete by:  As directed    Discharge instructions   Complete by:  As directed    Take medications as prescribed Maintain adequate hydration Follow low carbohydrates diet and watch portion. Be compliant with your insulin therapy as prescribed Arrange follow up with PCP in 1 week     Allergies as of 06/07/2018      Reactions   Metformin And Related Other (See Comments)   Abdominal cramps and diarrhea   Nsaids Nausea Only      Medication List    TAKE these medications   blood glucose meter kit and supplies Dispense based on patient and insurance preference. Use up to four times daily as directed. (FOR ICD-10 E10.9, E11.9).   gabapentin 300 MG capsule Commonly known as:  NEURONTIN Take 1 capsule (300 mg total) by mouth 4 (four) times daily.   insulin aspart 100 UNIT/ML injection Commonly known as:  NovoLOG Inject 10 Units into the skin 3 (three) times daily with meals for 30 days.   insulin glargine 100 UNIT/ML injection Commonly known as:  LANTUS Inject 0.15 mLs (15 Units total) into the skin 2 (two) times daily. What changed:    how much to take  when to take this   INSULIN SYRINGE .5CC/29G 29G X 1/2" 0.5 ML Misc Use as directed   lisinopril 2.5 MG tablet Commonly known as:  Zestril Take 1 tablet (2.5 mg total) by mouth daily for 30 days.   multivitamin with minerals Tabs tablet Take  1 tablet by mouth daily. Start taking on:  June 08, 2018   nicotine 14 mg/24hr patch Commonly known as:  NICODERM CQ - dosed in mg/24 hours Place 1 patch (14 mg total) onto the skin daily. Start taking on:  June 08, 2018   oxyCODONE 5 MG immediate release tablet Commonly known as:  Oxy IR/ROXICODONE Take 5 mg by mouth 3 (three) times daily.   pantoprazole 40 MG tablet Commonly known as:  PROTONIX Take 40 mg by mouth daily.      Allergies  Allergen Reactions  . Metformin And Related Other (See Comments)    Abdominal cramps and diarrhea  . Nsaids Nausea Only   Follow-up Information    [name], MD. Schedule an appointment as soon as possible for a visit in 1 week(s).   Specialty:  Internal Medicine Contact information: [place] [address] [phone]            The results of significant diagnostics from this hospitalization (including imaging, microbiology, ancillary and laboratory) are listed below for reference.    Significant Diagnostic Studies: Dg Chest 2 View  Result Date: 06/06/2018 CLINICAL DATA:  Cough EXAM: CHEST - 2 VIEW COMPARISON:  05/03/2018 FINDINGS: The heart size and mediastinal contours are within normal limits. Both lungs are clear. The visualized skeletal structures are unremarkable. IMPRESSION: No active cardiopulmonary disease. Electronically Signed   By: Franchot Gallo M.D.   On: 06/06/2018 13:23    Microbiology: Recent  Results (from the past 240 hour(s))  Urine culture     Status: None   Collection Time: 06/06/18 12:31 PM  Result Value Ref Range Status   Specimen Description   Final    URINE, CLEAN CATCH Performed at [facility], [address], [place], [place] [address]    Special Requests   Final    NONE Performed at [facility], [address], [place], [place] [address]    Culture   Final    NO GROWTH Performed at [facility], [place] [address], [place], [place] [address]    Report Status  06/07/2018 FINAL  Final  MRSA PCR Screening     Status: None   Collection Time: 06/06/18  2:52 PM  Result Value Ref Range Status   MRSA by PCR NEGATIVE NEGATIVE Final    Comment:        The GeneXpert MRSA Assay (FDA approved for NASAL specimens only), is one component of a comprehensive MRSA colonization surveillance program. It is not intended to diagnose MRSA infection nor to guide or monitor treatment for MRSA infections. Performed at [facility], [address] [place], [place] [address]      Labs: Basic Metabolic Panel: Recent Labs  Lab 06/06/18 1230 06/06/18 1825 06/07/18 0405  NA 121* 135 138  K 5.5* 3.1* 3.9  CL 83* 100 106  CO2 21* 27 27  GLUCOSE 1,040* 125* 181*  BUN 28* 23* 17  CREATININE 1.11 0.75 0.65  CALCIUM 8.8* 8.5* 8.3*  MG 1.8  --   --   PHOS 4.4  --   --    Liver Function Tests: Recent Labs  Lab 06/06/18 1230  AST 59*  ALT 54*  ALKPHOS 124  BILITOT 0.8  PROT 8.3*  ALBUMIN 4.6   CBC: Recent Labs  Lab 06/06/18 1230 06/07/18 0405  WBC 6.0 5.9  NEUTROABS 4.8  --   HGB 13.9 11.4*  HCT 43.9 34.0*  MCV 101.9* 95.5  PLT 219 188   Cardiac Enzymes: Recent Labs  Lab 06/06/18 1825  TROPONINI <0.03    CBG: Recent Labs  Lab 06/06/18 1835 06/06/18 1929 06/06/18 2037 06/07/18 0726 06/07/18 1122  GLUCAP 94 74 199* 155* 145*     Signed:  Barton Dubois MD.  [facility] 06/07/2018, 11:58 AM
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
## 2018-06-07 NOTE — Progress Notes (Signed)
Pt stated to social work he had a ride to come pick him up after he refused to go to the homeless shelter but when this RN went into room to discharge him he said he did not have a ride and told me not to call the number in the chart that he did not want her to know anything about him, pt requested that he just be wheeled out of hospital. Pt still refused to leave [facility] called security and pt is being escorted out.

## 2018-06-08 LAB — HEMOGLOBIN A1C
HEMOGLOBIN A1C: 12.7 % — AB (ref 4.8–5.6)
Mean Plasma Glucose: 318 mg/dL

## 2018-06-21 ENCOUNTER — Ambulatory Visit: Payer: Medicaid Other | Admitting: "Endocrinology

## 2018-07-11 ENCOUNTER — Other Ambulatory Visit: Payer: Self-pay

## 2018-07-11 ENCOUNTER — Encounter (HOSPITAL_COMMUNITY): Payer: Self-pay | Admitting: Emergency Medicine

## 2018-07-11 ENCOUNTER — Inpatient Hospital Stay (HOSPITAL_COMMUNITY)
Admission: EM | Admit: 2018-07-11 | Discharge: 2018-07-12 | DRG: 638 | Disposition: A | Discharge status: Home / Self Care | Payer: Medicaid Other | Attending: Internal Medicine | Admitting: Internal Medicine

## 2018-07-11 ENCOUNTER — Emergency Department (HOSPITAL_COMMUNITY): Payer: Medicaid Other

## 2018-07-11 DIAGNOSIS — F339 Major depressive disorder, recurrent, unspecified: Secondary | ICD-10-CM | POA: Diagnosis not present

## 2018-07-11 DIAGNOSIS — K5900 Constipation, unspecified: Secondary | ICD-10-CM | POA: Diagnosis present

## 2018-07-11 DIAGNOSIS — Z9119 Patient's noncompliance with other medical treatment and regimen: Secondary | ICD-10-CM | POA: Diagnosis not present

## 2018-07-11 DIAGNOSIS — F332 Major depressive disorder, recurrent severe without psychotic features: Secondary | ICD-10-CM | POA: Diagnosis present

## 2018-07-11 DIAGNOSIS — Z833 Family history of diabetes mellitus: Secondary | ICD-10-CM | POA: Diagnosis not present

## 2018-07-11 DIAGNOSIS — E1010 Type 1 diabetes mellitus with ketoacidosis without coma: Secondary | ICD-10-CM | POA: Diagnosis present

## 2018-07-11 DIAGNOSIS — E1310 Other specified diabetes mellitus with ketoacidosis without coma: Secondary | ICD-10-CM

## 2018-07-11 DIAGNOSIS — Z5329 Procedure and treatment not carried out because of patient's decision for other reasons: Secondary | ICD-10-CM | POA: Diagnosis present

## 2018-07-11 DIAGNOSIS — N179 Acute kidney failure, unspecified: Secondary | ICD-10-CM | POA: Diagnosis present

## 2018-07-11 DIAGNOSIS — I959 Hypotension, unspecified: Secondary | ICD-10-CM | POA: Diagnosis present

## 2018-07-11 DIAGNOSIS — R45851 Suicidal ideations: Secondary | ICD-10-CM | POA: Diagnosis present

## 2018-07-11 DIAGNOSIS — I10 Essential (primary) hypertension: Secondary | ICD-10-CM | POA: Diagnosis present

## 2018-07-11 DIAGNOSIS — E860 Dehydration: Secondary | ICD-10-CM | POA: Diagnosis present

## 2018-07-11 DIAGNOSIS — F1410 Cocaine abuse, uncomplicated: Secondary | ICD-10-CM | POA: Diagnosis present

## 2018-07-11 DIAGNOSIS — F17210 Nicotine dependence, cigarettes, uncomplicated: Secondary | ICD-10-CM | POA: Diagnosis present

## 2018-07-11 DIAGNOSIS — Z720 Tobacco use: Secondary | ICD-10-CM | POA: Diagnosis not present

## 2018-07-11 DIAGNOSIS — E1042 Type 1 diabetes mellitus with diabetic polyneuropathy: Secondary | ICD-10-CM | POA: Diagnosis present

## 2018-07-11 DIAGNOSIS — Z9114 Patient's other noncompliance with medication regimen: Secondary | ICD-10-CM

## 2018-07-11 DIAGNOSIS — Z590 Homelessness: Secondary | ICD-10-CM

## 2018-07-11 DIAGNOSIS — E039 Hypothyroidism, unspecified: Secondary | ICD-10-CM | POA: Diagnosis present

## 2018-07-11 DIAGNOSIS — K219 Gastro-esophageal reflux disease without esophagitis: Secondary | ICD-10-CM | POA: Diagnosis present

## 2018-07-11 DIAGNOSIS — Z888 Allergy status to other drugs, medicaments and biological substances status: Secondary | ICD-10-CM | POA: Diagnosis not present

## 2018-07-11 DIAGNOSIS — Z79899 Other long term (current) drug therapy: Secondary | ICD-10-CM | POA: Diagnosis not present

## 2018-07-11 DIAGNOSIS — Z794 Long term (current) use of insulin: Secondary | ICD-10-CM

## 2018-07-11 DIAGNOSIS — E1110 Type 2 diabetes mellitus with ketoacidosis without coma: Secondary | ICD-10-CM | POA: Diagnosis present

## 2018-07-11 DIAGNOSIS — E876 Hypokalemia: Secondary | ICD-10-CM | POA: Diagnosis not present

## 2018-07-11 DIAGNOSIS — Z886 Allergy status to analgesic agent status: Secondary | ICD-10-CM | POA: Diagnosis not present

## 2018-07-11 LAB — COMPREHENSIVE METABOLIC PANEL
ALT: 21 U/L (ref 0–44)
AST: 22 U/L (ref 15–41)
Albumin: 4 g/dL (ref 3.5–5.0)
Alkaline Phosphatase: 67 U/L (ref 38–126)
Anion gap: >20 — ABNORMAL HIGH (ref 5–15)
BUN: 24 mg/dL — ABNORMAL HIGH (ref 6–20)
CO2: 10 mmol/L — ABNORMAL LOW (ref 22–32)
Calcium: 8.4 mg/dL — ABNORMAL LOW (ref 8.9–10.3)
Chloride: 83 mmol/L — ABNORMAL LOW (ref 98–111)
Creatinine, Ser: 1.43 mg/dL — ABNORMAL HIGH (ref 0.61–1.24)
GFR calc Af Amer: >60 mL/min (ref >60)
GFR calc non Af Amer: 55 mL/min — ABNORMAL LOW (ref >60)
Glucose, Bld: 945 mg/dL (ref 70–99)
Potassium: 5 mmol/L (ref 3.5–5.1)
Sodium: 120 mmol/L — ABNORMAL LOW (ref 135–145)
Total Bilirubin: 2.2 mg/dL — ABNORMAL HIGH (ref 0.3–1.2)
Total Protein: 6.7 g/dL (ref 6.5–8.1)

## 2018-07-11 LAB — URINALYSIS, ROUTINE W REFLEX MICROSCOPIC
Bacteria, UA: NONE SEEN
Bilirubin Urine: NEGATIVE
Glucose, UA: >=500 mg/dL — AB
Hgb urine dipstick: NEGATIVE
Ketones, ur: 80 mg/dL — AB
Leukocytes,Ua: NEGATIVE
Nitrite: NEGATIVE
Protein, ur: NEGATIVE mg/dL
Specific Gravity, Urine: 1.023 (ref 1.005–1.030)
pH: 5 (ref 5.0–8.0)

## 2018-07-11 LAB — GLUCOSE, CAPILLARY
Glucose-Capillary: 141 mg/dL — ABNORMAL HIGH (ref 70–99)
Glucose-Capillary: 213 mg/dL — ABNORMAL HIGH (ref 70–99)
Glucose-Capillary: 281 mg/dL — ABNORMAL HIGH (ref 70–99)
Glucose-Capillary: 347 mg/dL — ABNORMAL HIGH (ref 70–99)
Glucose-Capillary: 469 mg/dL — ABNORMAL HIGH (ref 70–99)

## 2018-07-11 LAB — CBC WITH DIFFERENTIAL/PLATELET
Abs Immature Granulocytes: 0.04 10*3/uL (ref 0.00–0.07)
Basophils Absolute: 0.1 10*3/uL (ref 0.0–0.1)
Basophils Relative: 1 %
Eosinophils Absolute: 0.1 10*3/uL (ref 0.0–0.5)
Eosinophils Relative: 1 %
HCT: 43.6 % (ref 39.0–52.0)
Hemoglobin: 14.4 g/dL (ref 13.0–17.0)
Immature Granulocytes: 1 %
Lymphocytes Relative: 8 %
Lymphs Abs: 0.7 10*3/uL (ref 0.7–4.0)
MCH: 32.5 pg (ref 26.0–34.0)
MCHC: 33 g/dL (ref 30.0–36.0)
MCV: 98.4 fL (ref 80.0–100.0)
Monocytes Absolute: 0.5 10*3/uL (ref 0.1–1.0)
Monocytes Relative: 6 %
Neutro Abs: 6.9 10*3/uL (ref 1.7–7.7)
Neutrophils Relative %: 83 %
Platelets: 231 10*3/uL (ref 150–400)
RBC: 4.43 MIL/uL (ref 4.22–5.81)
RDW: 12.1 % (ref 11.5–15.5)
WBC: 8.2 10*3/uL (ref 4.0–10.5)
nRBC: 0 % (ref 0.0–0.2)

## 2018-07-11 LAB — BASIC METABOLIC PANEL WITH GFR
Anion gap: 9 (ref 5–15)
BUN: 17 mg/dL (ref 6–20)
CO2: 23 mmol/L (ref 22–32)
Calcium: 8.3 mg/dL — ABNORMAL LOW (ref 8.9–10.3)
Chloride: 100 mmol/L (ref 98–111)
Creatinine, Ser: 0.92 mg/dL (ref 0.61–1.24)
GFR calc Af Amer: >60 mL/min
GFR calc non Af Amer: >60 mL/min
Glucose, Bld: 151 mg/dL — ABNORMAL HIGH (ref 70–99)
Potassium: 3.9 mmol/L (ref 3.5–5.1)
Sodium: 132 mmol/L — ABNORMAL LOW (ref 135–145)

## 2018-07-11 LAB — BASIC METABOLIC PANEL
Anion gap: 18 — ABNORMAL HIGH (ref 5–15)
BUN: 21 mg/dL — ABNORMAL HIGH (ref 6–20)
CO2: 15 mmol/L — ABNORMAL LOW (ref 22–32)
Calcium: 8.4 mg/dL — ABNORMAL LOW (ref 8.9–10.3)
Chloride: 96 mmol/L — ABNORMAL LOW (ref 98–111)
Creatinine, Ser: 1.33 mg/dL — ABNORMAL HIGH (ref 0.61–1.24)
GFR calc Af Amer: >60 mL/min (ref >60)
GFR calc non Af Amer: 60 mL/min — ABNORMAL LOW (ref >60)
Glucose, Bld: 516 mg/dL (ref 70–99)
Potassium: 4.2 mmol/L (ref 3.5–5.1)
Sodium: 129 mmol/L — ABNORMAL LOW (ref 135–145)

## 2018-07-11 LAB — CBG MONITORING, ED
Glucose-Capillary: >600 mg/dL (ref 70–99)
Glucose-Capillary: >600 mg/dL (ref 70–99)

## 2018-07-11 LAB — RAPID URINE DRUG SCREEN, HOSP PERFORMED
Amphetamines: NOT DETECTED
Barbiturates: NOT DETECTED
Benzodiazepines: NOT DETECTED
Cocaine: POSITIVE — AB
Opiates: NOT DETECTED
Tetrahydrocannabinol: NOT DETECTED

## 2018-07-11 LAB — LIPASE, BLOOD: Lipase: 22 U/L (ref 11–51)

## 2018-07-11 MED ORDER — SUCRALFATE 1 GM/10ML PO SUSP
1.0000 g | Freq: Once | ORAL | Status: AC
  Administered 2018-07-11: 1 g via ORAL
  Filled 2018-07-11: qty 10

## 2018-07-11 MED ORDER — ENOXAPARIN SODIUM 40 MG/0.4ML ~~LOC~~ SOLN
40.0000 mg | SUBCUTANEOUS | Status: DC
  Filled 2018-07-11: qty 0.4

## 2018-07-11 MED ORDER — ONDANSETRON HCL 4 MG/2ML IJ SOLN
4.0000 mg | Freq: Once | INTRAMUSCULAR | Status: AC
  Administered 2018-07-11: 15:00:00 4 mg via INTRAVENOUS
  Filled 2018-07-11: qty 2

## 2018-07-11 MED ORDER — NICOTINE 7 MG/24HR TD PT24
7.0000 mg | MEDICATED_PATCH | Freq: Every day | TRANSDERMAL | Status: DC
  Filled 2018-07-11 (×3): qty 1

## 2018-07-11 MED ORDER — OXYCODONE HCL 5 MG PO TABS
5.0000 mg | ORAL_TABLET | Freq: Four times a day (QID) | ORAL | Status: DC | PRN
  Administered 2018-07-11 – 2018-07-12 (×2): 5 mg via ORAL
  Filled 2018-07-11 (×2): qty 1

## 2018-07-11 MED ORDER — SODIUM CHLORIDE 0.9 % IV BOLUS
1000.0000 mL | Freq: Once | INTRAVENOUS | Status: AC
  Administered 2018-07-11: 1000 mL via INTRAVENOUS

## 2018-07-11 MED ORDER — ACETAMINOPHEN 325 MG PO TABS: 650.0000 mg | ORAL_TABLET | Freq: Four times a day (QID) | ORAL | Status: DC | PRN

## 2018-07-11 MED ORDER — DEXTROSE-NACL 5-0.45 % IV SOLN
INTRAVENOUS | Status: DC
  Administered 2018-07-11: 22:00:00 via INTRAVENOUS

## 2018-07-11 MED ORDER — SENNOSIDES-DOCUSATE SODIUM 8.6-50 MG PO TABS: 2.0000 | ORAL_TABLET | Freq: Every day | ORAL | Status: DC

## 2018-07-11 MED ORDER — ONDANSETRON HCL 4 MG PO TABS: 4.0000 mg | ORAL_TABLET | Freq: Four times a day (QID) | ORAL | Status: DC | PRN

## 2018-07-11 MED ORDER — INSULIN REGULAR(HUMAN) IN NACL 100-0.9 UT/100ML-% IV SOLN
INTRAVENOUS | Status: DC
  Administered 2018-07-11: 5.4 [IU]/h via INTRAVENOUS
  Filled 2018-07-11: qty 100

## 2018-07-11 MED ORDER — POLYETHYLENE GLYCOL 3350 17 G PO PACK: 17.0000 g | PACK | Freq: Every day | ORAL | Status: DC

## 2018-07-11 MED ORDER — SODIUM CHLORIDE 0.9 % IV SOLN
INTRAVENOUS | Status: DC
  Administered 2018-07-11 (×2): via INTRAVENOUS

## 2018-07-11 MED ORDER — ACETAMINOPHEN 650 MG RE SUPP: 650.0000 mg | Freq: Four times a day (QID) | RECTAL | Status: DC | PRN

## 2018-07-11 MED ORDER — ONDANSETRON HCL 4 MG/2ML IJ SOLN: 4.0000 mg | Freq: Four times a day (QID) | INTRAMUSCULAR | Status: DC | PRN

## 2018-07-11 MED ORDER — IOHEXOL 300 MG/ML  SOLN
100.0000 mL | Freq: Once | INTRAMUSCULAR | Status: AC | PRN
  Administered 2018-07-11: 100 mL via INTRAVENOUS

## 2018-07-11 NOTE — ED Provider Notes (Signed)
[facility] EMERGENCY DEPARTMENT Provider Note   CSN: [id number] Arrival date & time: 07/11/18  1443    History   Chief Complaint Chief Complaint  Patient presents with   Abdominal Pain    HPI Kenneth [name] is a 53 y.o. male.     HPI Patient presents concern of abdominal pain.  Patient was, as of nausea, vomiting.  Onset was possibly 4 days ago, since that time in addition to nausea, pain he has had anorexia, has had no bowel movements. Pain is diffuse, sore, sharp.  No relief with anything.  Patient states that he takes his medication regularly, with possible exception of some today. He notes difficulty with controlling his diabetes, but reiterates that he takes his insulin regularly. Patient knowledges prior drug use, states no cocaine use in the past for 5 days, during this illness. During review of systems, the patient also notes intent to kill himself, history of depression. Past Medical History:  Diagnosis Date   Arthritis    Chronic generalized pain    Diabetes mellitus    DKA (diabetic ketoacidosis) ([place]) 07/2014   Malnutrition ([place])    Neuropathy    Noncompliance with medication regimen    Thyroid disease     Patient Active Problem List   Diagnosis Date Noted   Hyperosmolar non-ketotic state in patient with type 2 diabetes mellitus ([place]) 06/06/2018   Tobacco abuse 06/06/2018   Malnutrition of moderate degree 10/19/2017   Severe sepsis with septic shock (CODE) ([place]) 10/16/2017   AKI (acute kidney injury) ([place]) 10/16/2017   Esophagitis 10/16/2017   Hypotension 06/16/2017   Cocaine abuse ([place])    Homeless    Lactic acidosis    DKA, type 2 ([place]) 06/22/2016   Non compliance w medication regimen    Protein-calorie malnutrition, severe 06/11/2016   Chronic generalized pain 04/26/2016   Neuropathy 04/26/2016   Diabetes mellitus type 2, uncontrolled, with complications ([place])    MDD (major depressive disorder), recurrent severe,  without psychosis ([place]) 03/27/2016   Depression, recurrent ([facility])    Abdominal pain    Hyperglycemia 02/24/2016   Volume depletion 56/97/9480   Metabolic acidosis 16/55/3748   Diabetes mellitus with nonketotic hyperosmolarity ([place]) 09/21/2014   Noncompliance with medication regimen    Diabetes mellitus ([place])    DKA (diabetic ketoacidoses) ([place]) 07/24/2014   Dehydration 07/24/2014   Acute renal failure ([place]) 07/24/2014   Hyperkalemia 07/24/2014   Hyponatremia 07/24/2014   Hyperthyroidism 07/24/2014    History reviewed. No pertinent surgical history.      Home Medications    Prior to Admission medications   Medication Sig Start Date End Date Taking? Authorizing Provider  blood glucose meter kit and supplies Dispense based on patient and insurance preference. Use up to four times daily as directed. (FOR ICD-10 E10.9, E11.9). 05/04/18   Johnson, Clanford L, MD  gabapentin (NEURONTIN) 300 MG capsule Take 1 capsule (300 mg total) by mouth 4 (four) times daily. 06/07/18   Barton Dubois, MD  insulin aspart (NOVOLOG) 100 UNIT/ML injection Inject 10 Units into the skin 3 (three) times daily with meals for 30 days. 06/07/18 07/07/18  Barton Dubois, MD  insulin glargine (LANTUS) 100 UNIT/ML injection Inject 0.15 mLs (15 Units total) into the skin 2 (two) times daily. 06/07/18   Barton Dubois, MD  INSULIN SYRINGE .5CC/29G 29G X 1/2" 0.5 ML MISC Use as directed 05/04/18   Johnson, Clanford L, MD  lisinopril (PRINIVIL,ZESTRIL) 2.5 MG tablet Take 1 tablet (2.5 mg total) by  mouth daily for 30 days. 05/05/18 06/06/18  Murlean Iba, MD  Multiple Vitamin (MULTIVITAMIN WITH MINERALS) TABS tablet Take 1 tablet by mouth daily. 06/08/18   Barton Dubois, MD  nicotine (NICODERM CQ - DOSED IN MG/24 HOURS) 14 mg/24hr patch Place 1 patch (14 mg total) onto the skin daily. 06/08/18   Barton Dubois, MD  oxyCODONE (OXY IR/ROXICODONE) 5 MG immediate release tablet Take 5 mg by mouth 3 (three) times  daily.    [provider]  pantoprazole (PROTONIX) 40 MG tablet Take 40 mg by mouth daily.    [provider]    Family History Family History  Problem Relation Age of Onset   Diabetes Mother    Seizures Sister    Diabetes Sister     Social History Social History   Tobacco Use   Smoking status: Current Every Day Smoker    Packs/day: 0.50    Years: 8.00    Pack years: 4.00   Smokeless tobacco: Never Used  Substance Use Topics   Alcohol use: No   Drug use: Yes    Types: Cocaine    Comment: states " I am not telling you when I did it, its not of your busy"     Allergies   Metformin and related and Nsaids   Review of Systems Review of Systems  Constitutional:       Per HPI, otherwise negative  HENT:       Per HPI, otherwise negative  Respiratory:       Per HPI, otherwise negative  Cardiovascular:       Per HPI, otherwise negative  Gastrointestinal: Negative for vomiting.  Endocrine:       Negative aside from HPI  Genitourinary:       Neg aside from HPI   Musculoskeletal:       Per HPI, otherwise negative  Skin: Negative.   Neurological: Negative for syncope.  Psychiatric/Behavioral: Positive for suicidal ideas. The patient is nervous/anxious.      Physical Exam Updated Vital Signs BP 117/67    Pulse 92    Resp 20    Ht _0  (1.727 m)    Wt 63.5 kg    SpO2 100%    BMI 21.29 kg/m   Physical Exam Vitals signs and nursing note reviewed.  Constitutional:      General: He is not in acute distress.    Appearance: He is well-developed.  HENT:     Head: Normocephalic and atraumatic.  Eyes:     Conjunctiva/sclera: Conjunctivae normal.  Cardiovascular:     Rate and Rhythm: Normal rate and regular rhythm.  Pulmonary:     Effort: Pulmonary effort is normal. No respiratory distress.     Breath sounds: No stridor.  Abdominal:     General: There is no distension.     Tenderness: There is generalized abdominal tenderness.  Skin:     General: Skin is warm and dry.  Neurological:     Mental Status: He is alert and oriented to person, place, and time.  Psychiatric:        Mood and Affect: Mood is anxious.     Comments: Combative,      ED Treatments / Results  Labs (all labs ordered are listed, but only abnormal results are displayed) Labs Reviewed  COMPREHENSIVE METABOLIC PANEL - Abnormal; Notable for the following components:      Result Value   Sodium 120 (*)    Chloride 83 (*)  CO2 10 (*)    Glucose, Bld 945 (*)    BUN 24 (*)    Creatinine, Ser 1.43 (*)    Calcium 8.4 (*)    Total Bilirubin 2.2 (*)    GFR calc non Af Amer 55 (*)    Anion gap >20 (*)    All other components within normal limits  URINALYSIS, ROUTINE W REFLEX MICROSCOPIC - Abnormal; Notable for the following components:   Color, Urine COLORLESS (*)    Glucose, UA >=500 (*)    Ketones, ur 80 (*)    All other components within normal limits  CBG MONITORING, ED - Abnormal; Notable for the following components:   Glucose-Capillary >600 (*)    All other components within normal limits  LIPASE, BLOOD  CBC WITH DIFFERENTIAL/PLATELET    EKG None  Radiology Ct Abdomen Pelvis W Contrast  Result Date: 07/11/2018 CLINICAL DATA:  Has had a bowel movement in 4 days.  Abdominal pain. EXAM: CT ABDOMEN AND PELVIS WITH CONTRAST TECHNIQUE: Multidetector CT imaging of the abdomen and pelvis was performed using the standard protocol following bolus administration of intravenous contrast. CONTRAST:  151m OMNIPAQUE IOHEXOL 300 MG/ML  SOLN COMPARISON:  02/29/2016 FINDINGS: Lower chest: No acute abnormality. Hepatobiliary: No focal liver abnormality is seen. No gallstones, gallbladder wall thickening, or biliary dilatation. Pancreas: Unremarkable. No pancreatic ductal dilatation or surrounding inflammatory changes. Spleen: Normal in size without focal abnormality. Adrenals/Urinary Tract: Adrenal glands are unremarkable. Kidneys are normal, without renal  calculi, focal lesion, or hydronephrosis. Bladder is unremarkable. Stomach/Bowel: Stomach is within normal limits. Appendix appears normal. No evidence of bowel wall thickening, distention, or inflammatory changes. Moderate amount of stool in the ascending and transverse colon. Vascular/Lymphatic: Abdominal aortic atherosclerosis. No lymphadenopathy Reproductive: Prostate is unremarkable. Other: No abdominal wall hernia or abnormality. No abdominopelvic ascites. Musculoskeletal: No acute osseous abnormality. No aggressive osseous lesion. Mild degenerative disc disease with disc height loss at L5-S1. IMPRESSION: 1. No acute abdominal or pelvic pathology. 2. Moderate amount of stool in the ascending and transverse colon. Electronically Signed   By: HKathreen Devoid  On: 07/11/2018 17:12    Procedures Procedures (including critical care time)  Medications Ordered in ED Medications  0.9 %  sodium chloride infusion ( Intravenous New Bag/Given 07/11/18 1526)  dextrose 5 %-0.45 % sodium chloride infusion (has no administration in time range)  insulin regular, human (MYXREDLIN) 100 units/ 100 mL infusion (5.4 Units/hr Intravenous New Bag/Given 07/11/18 1718)  sucralfate (CARAFATE) 1 GM/10ML suspension 1 g (has no administration in time range)  ondansetron (ZOFRAN) injection 4 mg (4 mg Intravenous Given 07/11/18 1525)  iohexol (OMNIPAQUE) 300 MG/ML solution 100 mL (100 mLs Intravenous Contrast Given 07/11/18 1643)     Initial Impression / Assessment and Plan / ED Course  I have reviewed the triage vital signs and the nursing notes.  Pertinent labs & imaging results that were available during my care of the patient were reviewed by me and considered in my medical decision making (see chart for details).        5:34 PM Patient slightly more comfortable appearance. We have reviewed initial labs with him, labs notable for acute kidney injury, and glucose greater than 900 with substantial gap  acidosis. Patient has already begun to receive insulin drip, IV fluids. Now, with reassuring CT scan, both concern for DKA, critical electrolyte abnormalities, and acute kidney injury, he is preparing for admission for further evaluation, monitoring, management.  Final Clinical Impressions(s) / ED Diagnoses  Final diagnoses:  Diabetic ketoacidosis without coma associated with type 1 diabetes mellitus ([place])     CRITICAL CARE Performed by: Carmin Muskrat Total critical care time: 40 minutes Critical care time was exclusive of separately billable procedures and treating other patients. Critical care was necessary to treat or prevent imminent or life-threatening deterioration. Critical care was time spent personally by me on the following activities: development of treatment plan with patient and/or surrogate as well as nursing, discussions with consultants, evaluation of patient's response to treatment, examination of patient, obtaining history from patient or surrogate, ordering and performing treatments and interventions, ordering and review of laboratory studies, ordering and review of radiographic studies, pulse oximetry and re-evaluation of patient's condition.    Carmin Muskrat, MD 07/11/18 [phone]

## 2018-07-11 NOTE — ED Notes (Signed)
Pt resting. Calm. Cooperative. Pt staets he has no one, he is all alone. Used to take depression meds but not anymore because it did not help. When asked again about being suicidal. He just states " I dont know, I aint gonna do it here, Ill tell you that". Charge RN aware. Fan given per request

## 2018-07-11 NOTE — Progress Notes (Signed)
No order for BMP q4h- Kenneth [name] paged to see if BMP could be ordered q4h. Waiting for orders/call back.  Pt CONTINUOUSLY complaining that he needs to eat, if his sugar drops below 100 then he will have a seizure. Pt educated on orders and why they are in place. Educated on lab work as well. Pt irritable and agitated.

## 2018-07-11 NOTE — H&P (Signed)
History and Physical    Kenneth [name] LKG:[id number] DOB: December 31, 1965 DOA: 07/11/2018  PCP: [name], MD   Patient coming from: Home  I have personally briefly reviewed patient's old medical records in [place]  Chief Complaint: High blood sugars, no bowel movement in 4 days  HPI: Kenneth [name] is a 53 y.o. male with medical history significant for uncontrolled DM with medication noncompliance, frequent DKA admissions, depression, hypothyroidism, cocaine abuse, presented to the ED complaining that he had not had a bowel movement in 4 days.  Patient tells me he hurts all over from head to toe.  He also reports high blood sugars.  Reports compliance with his insulins but has a history of noncompliance. He denies sick contacts, has no cough or difficulty breathing.  No fever or chills.  At the time of my evaluation he denied suicidal ideations.  ED Course: Stable vitals.  Asked about suicidal ideation, if you felt like he was going to hurt himself, he said I do not feel like it, I am going to.  He later said I do not know, I aint gonna do it here.  He became agitated and he was asked about cocaine use.  Glucose 945, with anion gap greater than 20, bicarb 10, with creatinine 1.4, baseline 0.6- 0.7. Marland Kitchen UA Not suggestive of infection.  Unremarkable CBC.  Abd CT and pelvis without contrast-no acute abdominal pelvic pathology, moderate stool ascending and transverse colon.  Review of Systems: As per HPI all other systems reviewed and negative.  Past Medical History:  Diagnosis Date   Arthritis    Chronic generalized pain    Diabetes mellitus    DKA (diabetic ketoacidosis) ([place]) 07/2014   Malnutrition ([place])    Neuropathy    Noncompliance with medication regimen    Thyroid disease     History reviewed. No pertinent surgical history.   reports that he has been smoking. He has a 4.00 pack-year smoking history. He has never used smokeless tobacco. He reports current drug  use. Drug: Cocaine. He reports that he does not drink alcohol.  Allergies  Allergen Reactions   Metformin And Related Other (See Comments)    Abdominal cramps and diarrhea   Nsaids Nausea Only    Family History  Problem Relation Age of Onset   Diabetes Mother    Seizures Sister    Diabetes Sister     Prior to Admission medications   Medication Sig Start Date End Date Taking? Authorizing Provider  gabapentin (NEURONTIN) 300 MG capsule Take 1 capsule (300 mg total) by mouth 4 (four) times daily. Patient taking differently: Take 300 mg by mouth 3 (three) times daily.  06/07/18  Yes Barton Dubois, MD  lisinopril (PRINIVIL,ZESTRIL) 2.5 MG tablet Take 1 tablet (2.5 mg total) by mouth daily for 30 days. Patient taking differently: Take 5 mg by mouth daily.  05/05/18 07/11/18 Yes Johnson, Clanford L, MD  oxyCODONE (OXY IR/ROXICODONE) 5 MG immediate release tablet Take 5 mg by mouth 3 (three) times daily.   Yes [provider]  pantoprazole (PROTONIX) 40 MG tablet Take 40 mg by mouth daily.   Yes [provider]  blood glucose meter kit and supplies Dispense based on patient and insurance preference. Use up to four times daily as directed. (FOR ICD-10 E10.9, E11.9). Patient not taking: Reported on 07/11/2018 05/04/18   Irwin Brakeman L, MD  insulin aspart (NOVOLOG) 100 UNIT/ML injection Inject 10 Units into the skin 3 (three) times daily with  meals for 30 days. 06/07/18 07/07/18  Barton Dubois, MD  insulin glargine (LANTUS) 100 UNIT/ML injection Inject 0.15 mLs (15 Units total) into the skin 2 (two) times daily. 06/07/18   Barton Dubois, MD  INSULIN SYRINGE .5CC/29G 29G X 1/2" 0.5 ML MISC Use as directed 05/04/18   Murlean Iba, MD  Multiple Vitamin (MULTIVITAMIN WITH MINERALS) TABS tablet Take 1 tablet by mouth daily. 06/08/18   Barton Dubois, MD  nicotine (NICODERM CQ - DOSED IN MG/24 HOURS) 14 mg/24hr patch Place 1 patch (14 mg total) onto the skin daily. 06/08/18    Barton Dubois, MD    Physical Exam: Vitals:   07/11/18 1515 07/11/18 1530 07/11/18 1600 07/11/18 1630  BP:  118/68 109/62 117/67  Pulse: (!) 107 98 96 92  Resp: (!) 24 (!) _0 SpO2: 100% 99% 100% 100%  Weight:      Height:        Constitutional: NAD, calm, comfortable Vitals:   07/11/18 1515 07/11/18 1530 07/11/18 1600 07/11/18 1630  BP:  118/68 109/62 117/67  Pulse: (!) 107 98 96 92  Resp: (!) 24 (!) _1 SpO2: 100% 99% 100% 100%  Weight:      Height:       Eyes: PERRL, lids and conjunctivae normal ENMT: Mucous membranes are dry. Posterior pharynx clear of any exudate or lesions. Neck: normal, supple, no masses, no thyromegaly Respiratory: clear to auscultation bilaterally, no wheezing, no crackles. Normal respiratory effort. No accessory muscle use.  Cardiovascular: Regular rate and rhythm, no murmurs / rubs / gallops. No extremity edema. 2+ pedal pulses. No carotid bruits.  Abdomen: mild diffuse tenderness, no masses palpated. No hepatosplenomegaly. Bowel sounds positive.  Musculoskeletal: no clubbing / cyanosis. No joint deformity upper and lower extremities. Good ROM, no contractures. Normal muscle tone.  Skin: no rashes, lesions, ulcers. No induration Neurologic: CN 2-12 grossly intact. Strength 5/5 in all 4.  Psychiatric: Normal judgment and insight. Alert and oriented x 3. Normal mood.   Labs on Admission: I have personally reviewed following labs and imaging studies  CBC: Recent Labs  Lab 07/11/18 1515  WBC 8.2  NEUTROABS 6.9  HGB 14.4  HCT 43.6  MCV 98.4  PLT 967   Basic Metabolic Panel: Recent Labs  Lab 07/11/18 1515  NA 120*  K 5.0  CL 83*  CO2 10*  GLUCOSE 945*  BUN 24*  CREATININE 1.43*  CALCIUM 8.4*   Liver Function Tests: Recent Labs  Lab 07/11/18 1515  AST 22  ALT 21  ALKPHOS 67  BILITOT 2.2*  PROT 6.7  ALBUMIN 4.0   Recent Labs  Lab 07/11/18 1515  LIPASE 22   CBG: Recent Labs  Lab 07/11/18 1447  GLUCAP  >600*   Urine analysis:    Component Value Date/Time   COLORURINE COLORLESS (A) 07/11/2018 1526   APPEARANCEUR CLEAR 07/11/2018 1526   LABSPEC 1.023 07/11/2018 1526   PHURINE 5.0 07/11/2018 1526   GLUCOSEU >=500 (A) 07/11/2018 1526   HGBUR NEGATIVE 07/11/2018 1526   BILIRUBINUR NEGATIVE 07/11/2018 1526   KETONESUR 80 (A) 07/11/2018 1526   PROTEINUR NEGATIVE 07/11/2018 1526   UROBILINOGEN 0.2 01/18/2015 2300   NITRITE NEGATIVE 07/11/2018 1526   LEUKOCYTESUR NEGATIVE 07/11/2018 1526    Radiological Exams on Admission: Ct Abdomen Pelvis W Contrast  Result Date: 07/11/2018 CLINICAL DATA:  Has had a bowel movement in 4 days.  Abdominal pain. EXAM: CT ABDOMEN AND PELVIS WITH CONTRAST TECHNIQUE: Multidetector CT imaging  of the abdomen and pelvis was performed using the standard protocol following bolus administration of intravenous contrast. CONTRAST:  169m OMNIPAQUE IOHEXOL 300 MG/ML  SOLN COMPARISON:  02/29/2016 FINDINGS: Lower chest: No acute abnormality. Hepatobiliary: No focal liver abnormality is seen. No gallstones, gallbladder wall thickening, or biliary dilatation. Pancreas: Unremarkable. No pancreatic ductal dilatation or surrounding inflammatory changes. Spleen: Normal in size without focal abnormality. Adrenals/Urinary Tract: Adrenal glands are unremarkable. Kidneys are normal, without renal calculi, focal lesion, or hydronephrosis. Bladder is unremarkable. Stomach/Bowel: Stomach is within normal limits. Appendix appears normal. No evidence of bowel wall thickening, distention, or inflammatory changes. Moderate amount of stool in the ascending and transverse colon. Vascular/Lymphatic: Abdominal aortic atherosclerosis. No lymphadenopathy Reproductive: Prostate is unremarkable. Other: No abdominal wall hernia or abnormality. No abdominopelvic ascites. Musculoskeletal: No acute osseous abnormality. No aggressive osseous lesion. Mild degenerative disc disease with disc height loss at L5-S1.  IMPRESSION: 1. No acute abdominal or pelvic pathology. 2. Moderate amount of stool in the ascending and transverse colon. Electronically Signed   By: HKathreen Devoid  On: 07/11/2018 17:12    EKG: None.  Assessment/Plan Active Problems:   DKA (diabetic ketoacidoses) (H[facility])   DKA - with hyperglycemia 945,.  With marked anion gap metabolic acidosis-anion gap 27,  bicarb 10, AKI creatinine 1.4, baseline 0.6-0.7. Insulin GTT started in ED. At this time no focus of infection identified, DKA likely secondary to noncompliance history and prior admissions for same.  Is unable to tell me exactly what his home regimen is. - give 1L bolus, continue IVF N/s 125cc/hr - Close BMP monitoring -Continue insulin GTT, per DKA protocol - Hgba1c  Acute kidney injury- cr 1.43,  baseline 0.6-0.7. -Hydrate -BMP a.m -Lisinopril with AKI  Pseudohyponatremia sodium 120, record sodium for glucose is 140. -DKA protocol, hydrate.  Hypertension-Systolic 1270-350K -Hold lisinopril with AKI  Depression, ?Suicidal ideation-  -TSS consult -Suicide precautions  Neuropathy -Resume Neurontin pending med rec -Continue PRN oxycodone.  Tobacco abuse and cocaine abuse  -UDS  -Nicotine patch     DVT prophylaxis: Lovenox Code Status: Full Family Communication:None at bedside Disposition Plan: Per rounding team Consults called:None Admission status: Inpt, Step down I certify that at the point of admission it is my clinical judgment that the patient will require inpatient hospital care spanning beyond 2 midnights from the point of admission due to high intensity of service, high risk for further deterioration and high frequency of surveillance required. The following factors support the patient status of inpatient: DKA requiring insulin drip, frequent monitoring and hence stepdown level of care.   EBethena RoysMD [facility]  07/11/2018, 7:47 PM

## 2018-07-11 NOTE — ED Notes (Signed)
Called ICU to inform pt was being transferred upstairs at this time.

## 2018-07-11 NOTE — ED Notes (Addendum)
This RN, per EMS report, asked about his cocaine abuse history. Pt began to scream at me and the staff, pointed his finger in my face and told me to "shut the fuck up, you dont need to know a damn thing because that's none of your damn business. You shut up bitch."  Pt then began to scream at other two nurses in the room when told why we have to ask that question.  Pt raise up at the nurse and screamed and then pointed his finger at them as well.  Pt then stated "

## 2018-07-11 NOTE — ED Notes (Signed)
See notes. When asked if pt felt like he wanted to hurt himself. Pt states "I dont feel like it, Im going to"denies plan. Security at bedside.

## 2018-07-11 NOTE — ED Notes (Addendum)
TTS clinician awaiting callback from [facility], [place], regarding TTS consult. Message left with unit receptionist.

## 2018-07-11 NOTE — ED Triage Notes (Signed)
Pt states " I havent had a bowel movement in four day, my blood sugar is high and my abdomen hurts

## 2018-07-11 NOTE — ED Notes (Addendum)
Advised pt that glucose was just resulted and edp aware. Pt taken to ct.

## 2018-07-11 NOTE — ED Notes (Signed)
CRITICAL VALUE ALERT  Critical Value:  Glucose 945  Date & Time Notied:  07/11/18 1630  Provider Notified: dr Jeraldine Loots  Orders Received/Actions taken:

## 2018-07-11 NOTE — ED Notes (Signed)
Pt has urinated over clear urine Water given. Pt requesting something for his acid reflux. States carafate works good.

## 2018-07-12 ENCOUNTER — Encounter (HOSPITAL_COMMUNITY): Payer: Self-pay | Admitting: *Deleted

## 2018-07-12 ENCOUNTER — Inpatient Hospital Stay (HOSPITAL_COMMUNITY)
Admission: EM | Admit: 2018-07-12 | Discharge: 2018-07-17 | Disposition: A | Discharge status: Home / Self Care | Payer: Medicaid Other | Source: Home / Self Care | Attending: Family Medicine | Admitting: Family Medicine

## 2018-07-12 ENCOUNTER — Encounter (HOSPITAL_COMMUNITY): Payer: Self-pay

## 2018-07-12 ENCOUNTER — Other Ambulatory Visit: Payer: Self-pay

## 2018-07-12 DIAGNOSIS — R45851 Suicidal ideations: Secondary | ICD-10-CM

## 2018-07-12 DIAGNOSIS — E1310 Other specified diabetes mellitus with ketoacidosis without coma: Secondary | ICD-10-CM

## 2018-07-12 DIAGNOSIS — Z720 Tobacco use: Secondary | ICD-10-CM | POA: Diagnosis present

## 2018-07-12 DIAGNOSIS — F1410 Cocaine abuse, uncomplicated: Secondary | ICD-10-CM

## 2018-07-12 DIAGNOSIS — E1110 Type 2 diabetes mellitus with ketoacidosis without coma: Secondary | ICD-10-CM

## 2018-07-12 DIAGNOSIS — E860 Dehydration: Secondary | ICD-10-CM

## 2018-07-12 DIAGNOSIS — Z9114 Patient's other noncompliance with medication regimen: Secondary | ICD-10-CM

## 2018-07-12 DIAGNOSIS — F339 Major depressive disorder, recurrent, unspecified: Secondary | ICD-10-CM | POA: Diagnosis present

## 2018-07-12 DIAGNOSIS — F332 Major depressive disorder, recurrent severe without psychotic features: Secondary | ICD-10-CM | POA: Diagnosis present

## 2018-07-12 LAB — BLOOD GAS, VENOUS
Acid-base deficit: 9.9 mmol/L — ABNORMAL HIGH (ref 0.0–2.0)
Bicarbonate: 15.7 mmol/L — ABNORMAL LOW (ref 20.0–28.0)
FIO2: 21
O2 Saturation: 61.8 %
Patient temperature: 37
pCO2, Ven: 38.8 mmHg — ABNORMAL LOW (ref 44.0–60.0)
pH, Ven: 7.24 — ABNORMAL LOW (ref 7.250–7.430)
pO2, Ven: 36.3 mmHg (ref 32.0–45.0)

## 2018-07-12 LAB — COMPREHENSIVE METABOLIC PANEL
ALT: 18 U/L (ref 0–44)
AST: 19 U/L (ref 15–41)
Albumin: 3.5 g/dL (ref 3.5–5.0)
Alkaline Phosphatase: 60 U/L (ref 38–126)
Anion gap: 16 — ABNORMAL HIGH (ref 5–15)
BUN: 18 mg/dL (ref 6–20)
CO2: 18 mmol/L — ABNORMAL LOW (ref 22–32)
Calcium: 8.3 mg/dL — ABNORMAL LOW (ref 8.9–10.3)
Chloride: 93 mmol/L — ABNORMAL LOW (ref 98–111)
Creatinine, Ser: 1.03 mg/dL (ref 0.61–1.24)
GFR calc Af Amer: >60 mL/min (ref >60)
GFR calc non Af Amer: >60 mL/min (ref >60)
Glucose, Bld: 567 mg/dL (ref 70–99)
Potassium: 4.7 mmol/L (ref 3.5–5.1)
Sodium: 127 mmol/L — ABNORMAL LOW (ref 135–145)
Total Bilirubin: 1.2 mg/dL (ref 0.3–1.2)
Total Protein: 5.7 g/dL — ABNORMAL LOW (ref 6.5–8.1)

## 2018-07-12 LAB — RAPID URINE DRUG SCREEN, HOSP PERFORMED
Amphetamines: NOT DETECTED
Amphetamines: NOT DETECTED
Barbiturates: NOT DETECTED
Barbiturates: NOT DETECTED
Benzodiazepines: NOT DETECTED
Benzodiazepines: NOT DETECTED
Cocaine: POSITIVE — AB
Cocaine: NOT DETECTED
Opiates: NOT DETECTED
Opiates: NOT DETECTED
Tetrahydrocannabinol: NOT DETECTED
Tetrahydrocannabinol: NOT DETECTED

## 2018-07-12 LAB — GLUCOSE, CAPILLARY
Glucose-Capillary: 106 mg/dL — ABNORMAL HIGH (ref 70–99)
Glucose-Capillary: 143 mg/dL — ABNORMAL HIGH (ref 70–99)
Glucose-Capillary: 149 mg/dL — ABNORMAL HIGH (ref 70–99)
Glucose-Capillary: 158 mg/dL — ABNORMAL HIGH (ref 70–99)
Glucose-Capillary: 159 mg/dL — ABNORMAL HIGH (ref 70–99)
Glucose-Capillary: 178 mg/dL — ABNORMAL HIGH (ref 70–99)
Glucose-Capillary: 207 mg/dL — ABNORMAL HIGH (ref 70–99)
Glucose-Capillary: 228 mg/dL — ABNORMAL HIGH (ref 70–99)
Glucose-Capillary: 410 mg/dL — ABNORMAL HIGH (ref 70–99)
Glucose-Capillary: 451 mg/dL — ABNORMAL HIGH (ref 70–99)
Glucose-Capillary: 536 mg/dL (ref 70–99)
Glucose-Capillary: 59 mg/dL — ABNORMAL LOW (ref 70–99)
Glucose-Capillary: 59 mg/dL — ABNORMAL LOW (ref 70–99)

## 2018-07-12 LAB — CBC WITH DIFFERENTIAL/PLATELET
Abs Immature Granulocytes: 0.02 10*3/uL (ref 0.00–0.07)
Basophils Absolute: 0.1 10*3/uL (ref 0.0–0.1)
Basophils Relative: 1 %
Eosinophils Absolute: 0.1 10*3/uL (ref 0.0–0.5)
Eosinophils Relative: 2 %
HCT: 37.7 % — ABNORMAL LOW (ref 39.0–52.0)
Hemoglobin: 13 g/dL (ref 13.0–17.0)
Immature Granulocytes: 0 %
Lymphocytes Relative: 18 %
Lymphs Abs: 1.1 10*3/uL (ref 0.7–4.0)
MCH: 32.6 pg (ref 26.0–34.0)
MCHC: 34.5 g/dL (ref 30.0–36.0)
MCV: 94.5 fL (ref 80.0–100.0)
Monocytes Absolute: 0.5 10*3/uL (ref 0.1–1.0)
Monocytes Relative: 9 %
Neutro Abs: 4.3 10*3/uL (ref 1.7–7.7)
Neutrophils Relative %: 70 %
Platelets: 189 10*3/uL (ref 150–400)
RBC: 3.99 MIL/uL — ABNORMAL LOW (ref 4.22–5.81)
RDW: 11.8 % (ref 11.5–15.5)
WBC: 6.1 10*3/uL (ref 4.0–10.5)
nRBC: 0 % (ref 0.0–0.2)

## 2018-07-12 LAB — MRSA PCR SCREENING: MRSA by PCR: NEGATIVE

## 2018-07-12 LAB — BASIC METABOLIC PANEL
Anion gap: 9 (ref 5–15)
Anion gap: 19 — ABNORMAL HIGH (ref 5–15)
Anion gap: 13 (ref 5–15)
Anion gap: 7 (ref 5–15)
BUN: 16 mg/dL (ref 6–20)
BUN: 19 mg/dL (ref 6–20)
BUN: 17 mg/dL (ref 6–20)
BUN: 15 mg/dL (ref 6–20)
CO2: 24 mmol/L (ref 22–32)
CO2: 12 mmol/L — ABNORMAL LOW (ref 22–32)
CO2: 15 mmol/L — ABNORMAL LOW (ref 22–32)
CO2: 18 mmol/L — ABNORMAL LOW (ref 22–32)
Calcium: 8.1 mg/dL — ABNORMAL LOW (ref 8.9–10.3)
Calcium: 7.7 mg/dL — ABNORMAL LOW (ref 8.9–10.3)
Calcium: 8 mg/dL — ABNORMAL LOW (ref 8.9–10.3)
Calcium: 7.8 mg/dL — ABNORMAL LOW (ref 8.9–10.3)
Chloride: 98 mmol/L (ref 98–111)
Chloride: 96 mmol/L — ABNORMAL LOW (ref 98–111)
Chloride: 103 mmol/L (ref 98–111)
Chloride: 106 mmol/L (ref 98–111)
Creatinine, Ser: 0.8 mg/dL (ref 0.61–1.24)
Creatinine, Ser: 1.19 mg/dL (ref 0.61–1.24)
Creatinine, Ser: 1.08 mg/dL (ref 0.61–1.24)
Creatinine, Ser: 0.66 mg/dL (ref 0.61–1.24)
GFR calc Af Amer: >60 mL/min (ref >60)
GFR calc Af Amer: >60 mL/min (ref >60)
GFR calc Af Amer: >60 mL/min (ref >60)
GFR calc Af Amer: >60 mL/min (ref >60)
GFR calc non Af Amer: >60 mL/min (ref >60)
GFR calc non Af Amer: >60 mL/min (ref >60)
GFR calc non Af Amer: >60 mL/min (ref >60)
GFR calc non Af Amer: >60 mL/min (ref >60)
Glucose, Bld: 232 mg/dL — ABNORMAL HIGH (ref 70–99)
Glucose, Bld: 574 mg/dL (ref 70–99)
Glucose, Bld: 293 mg/dL — ABNORMAL HIGH (ref 70–99)
Glucose, Bld: 125 mg/dL — ABNORMAL HIGH (ref 70–99)
Potassium: 3.9 mmol/L (ref 3.5–5.1)
Potassium: 4.9 mmol/L (ref 3.5–5.1)
Potassium: 3.9 mmol/L (ref 3.5–5.1)
Potassium: 3.6 mmol/L (ref 3.5–5.1)
Sodium: 131 mmol/L — ABNORMAL LOW (ref 135–145)
Sodium: 127 mmol/L — ABNORMAL LOW (ref 135–145)
Sodium: 131 mmol/L — ABNORMAL LOW (ref 135–145)
Sodium: 131 mmol/L — ABNORMAL LOW (ref 135–145)

## 2018-07-12 LAB — URINALYSIS, ROUTINE W REFLEX MICROSCOPIC
Bacteria, UA: NONE SEEN
Bilirubin Urine: NEGATIVE
Glucose, UA: >=500 mg/dL — AB
Hgb urine dipstick: NEGATIVE
Ketones, ur: 80 mg/dL — AB
Leukocytes,Ua: NEGATIVE
Nitrite: NEGATIVE
Protein, ur: NEGATIVE mg/dL
Specific Gravity, Urine: 1.028 (ref 1.005–1.030)
pH: 5 (ref 5.0–8.0)

## 2018-07-12 LAB — CBG MONITORING, ED
Glucose-Capillary: >600 mg/dL (ref 70–99)
Glucose-Capillary: >600 mg/dL (ref 70–99)
Glucose-Capillary: 431 mg/dL — ABNORMAL HIGH (ref 70–99)

## 2018-07-12 LAB — CBC
HCT: 38.1 % — ABNORMAL LOW (ref 39.0–52.0)
Hemoglobin: 12.8 g/dL — ABNORMAL LOW (ref 13.0–17.0)
MCH: 32.7 pg (ref 26.0–34.0)
MCHC: 33.6 g/dL (ref 30.0–36.0)
MCV: 97.2 fL (ref 80.0–100.0)
Platelets: 163 10*3/uL (ref 150–400)
RBC: 3.92 MIL/uL — ABNORMAL LOW (ref 4.22–5.81)
RDW: 12 % (ref 11.5–15.5)
WBC: 6.8 10*3/uL (ref 4.0–10.5)
nRBC: 0 % (ref 0.0–0.2)

## 2018-07-12 LAB — ETHANOL: Alcohol, Ethyl (B): <10 mg/dL (ref <10)

## 2018-07-12 LAB — BETA-HYDROXYBUTYRIC ACID: Beta-Hydroxybutyric Acid: 6.57 mmol/L — ABNORMAL HIGH (ref 0.05–0.27)

## 2018-07-12 MED ORDER — SODIUM CHLORIDE 0.9 % IV SOLN
INTRAVENOUS | Status: DC
  Administered 2018-07-12: 16:00:00 via INTRAVENOUS

## 2018-07-12 MED ORDER — INSULIN ASPART 100 UNIT/ML ~~LOC~~ SOLN: 12.0000 [IU] | Freq: Three times a day (TID) | SUBCUTANEOUS | 3 refills | Status: DC

## 2018-07-12 MED ORDER — GABAPENTIN 300 MG PO CAPS: 300.0000 mg | ORAL_CAPSULE | Freq: Four times a day (QID) | ORAL | 1 refills | Status: DC

## 2018-07-12 MED ORDER — INSULIN ASPART 100 UNIT/ML IV SOLN: 10.0000 [IU] | Freq: Once | INTRAVENOUS | Status: DC

## 2018-07-12 MED ORDER — OXYCODONE HCL 5 MG PO TABS
5.0000 mg | ORAL_TABLET | ORAL | Status: DC | PRN
  Administered 2018-07-13: 01:00:00 5 mg via ORAL
  Filled 2018-07-12: qty 1

## 2018-07-12 MED ORDER — SODIUM CHLORIDE 0.9 % IV SOLN: INTRAVENOUS | Status: DC

## 2018-07-12 MED ORDER — TEMAZEPAM 15 MG PO CAPS: 15.0000 mg | ORAL_CAPSULE | Freq: Every evening | ORAL | Status: DC | PRN

## 2018-07-12 MED ORDER — DEXTROSE-NACL 5-0.45 % IV SOLN
INTRAVENOUS | Status: DC
  Administered 2018-07-12 – 2018-07-13 (×2): via INTRAVENOUS

## 2018-07-12 MED ORDER — INSULIN ASPART 100 UNIT/ML IV SOLN
5.0000 [IU] | Freq: Once | INTRAVENOUS | Status: AC
  Administered 2018-07-12: 08:00:00 5 [IU] via INTRAVENOUS

## 2018-07-12 MED ORDER — INSULIN REGULAR(HUMAN) IN NACL 100-0.9 UT/100ML-% IV SOLN
INTRAVENOUS | Status: DC
  Administered 2018-07-12: 5.4 [IU]/h via INTRAVENOUS
  Administered 2018-07-13: 04:00:00 1.3 [IU]/h via INTRAVENOUS
  Filled 2018-07-12: qty 100

## 2018-07-12 MED ORDER — ALPRAZOLAM 0.5 MG PO TABS: 0.5000 mg | ORAL_TABLET | Freq: Every evening | ORAL | Status: DC | PRN

## 2018-07-12 MED ORDER — NICOTINE 21 MG/24HR TD PT24: 21.0000 mg | MEDICATED_PATCH | Freq: Every day | TRANSDERMAL | Status: DC | PRN

## 2018-07-12 MED ORDER — DEXTROSE 50 % IV SOLN: 25.0000 mL | Freq: Once | INTRAVENOUS | Status: DC

## 2018-07-12 MED ORDER — FAMOTIDINE IN NACL 20-0.9 MG/50ML-% IV SOLN
20.0000 mg | Freq: Two times a day (BID) | INTRAVENOUS | Status: DC
  Administered 2018-07-12 – 2018-07-15 (×6): 20 mg via INTRAVENOUS
  Filled 2018-07-12 (×6): qty 50

## 2018-07-12 MED ORDER — DEXTROSE 50 % IV SOLN: 50.0000 mL | Freq: Once | INTRAVENOUS | Status: DC

## 2018-07-12 MED ORDER — INSULIN ASPART 100 UNIT/ML ~~LOC~~ SOLN: 0.0000 [IU] | Freq: Three times a day (TID) | SUBCUTANEOUS | Status: DC

## 2018-07-12 MED ORDER — INSULIN GLARGINE 100 UNIT/ML ~~LOC~~ SOLN: 16.0000 [IU] | Freq: Two times a day (BID) | SUBCUTANEOUS | 3 refills | Status: DC

## 2018-07-12 MED ORDER — SODIUM CHLORIDE 0.9 % IV BOLUS
1000.0000 mL | Freq: Once | INTRAVENOUS | Status: AC
  Administered 2018-07-12: 14:00:00 1000 mL via INTRAVENOUS

## 2018-07-12 MED ORDER — LISINOPRIL 2.5 MG PO TABS: 5.0000 mg | ORAL_TABLET | Freq: Every day | ORAL | 0 refills | Status: DC

## 2018-07-12 MED ORDER — ENOXAPARIN SODIUM 40 MG/0.4ML ~~LOC~~ SOLN
40.0000 mg | SUBCUTANEOUS | Status: DC
  Administered 2018-07-12 – 2018-07-15 (×4): 40 mg via SUBCUTANEOUS
  Filled 2018-07-12 (×5): qty 0.4

## 2018-07-12 MED ORDER — INSULIN GLARGINE 100 UNIT/ML ~~LOC~~ SOLN
15.0000 [IU] | Freq: Once | SUBCUTANEOUS | Status: DC
  Filled 2018-07-12: qty 0.15

## 2018-07-12 MED ORDER — LORAZEPAM 2 MG/ML IJ SOLN
1.0000 mg | Freq: Once | INTRAMUSCULAR | Status: AC
  Administered 2018-07-13: 1 mg via INTRAVENOUS
  Filled 2018-07-12: qty 1

## 2018-07-12 MED ORDER — SENNOSIDES-DOCUSATE SODIUM 8.6-50 MG PO TABS: 2.0000 | ORAL_TABLET | Freq: Every day | ORAL | 0 refills | Status: AC

## 2018-07-12 MED ORDER — INSULIN GLARGINE 100 UNIT/ML ~~LOC~~ SOLN
16.0000 [IU] | Freq: Two times a day (BID) | SUBCUTANEOUS | Status: DC
  Filled 2018-07-12 (×3): qty 0.16

## 2018-07-12 MED ORDER — "INSULIN SYRINGE 29G X 1/2"" 0.5 ML MISC": 0 refills | Status: DC

## 2018-07-12 MED ORDER — POTASSIUM CHLORIDE 10 MEQ/100ML IV SOLN
10.0000 meq | INTRAVENOUS | Status: AC
  Administered 2018-07-12 (×2): 10 meq via INTRAVENOUS
  Filled 2018-07-12 (×2): qty 100

## 2018-07-12 NOTE — ED Notes (Signed)
Date and time results received: 07/12/18 1542 (use smartphrase ".now" to insert current time)  Test:Glucose Critical Value:574 Name of Provider Notified: Ardyth Harps PA  Orders Received? Or Actions Taken?: NA

## 2018-07-12 NOTE — Progress Notes (Signed)
Pt advised that his Co2 is still not where it is supposed to be- it is 18, per protocol it is supposed to be 20. Pt started cussing in room and stating that he will NOT WAIT 4 more hours for his labs to be drawn again. Pt educated on DKA protocol- did not want to hear education, and just started cussing.

## 2018-07-12 NOTE — ED Notes (Signed)
Patient is refusing lab work, IV, and IV fluids at this time. Patient stated "I ain't eating this shit!" referring to his meal tray. I offered to move the food out of his room and he stated "Don't even try to touch it!". Patient yelling at registration to get out of his room, also.

## 2018-07-12 NOTE — ED Notes (Signed)
Date and time results received: 07/12/18 12:07 PM  Test: glucose Critical Value: 567  Name of Provider Notified: Rubin Payor, MD  Orders Received? Or Actions Taken?: None at this time

## 2018-07-12 NOTE — ED Notes (Signed)
Pt very hostile at this time.

## 2018-07-12 NOTE — Discharge Summary (Signed)
Physician Discharge Summary  Kenneth [name]:[id number] DOB: 06/20/1965 DOA: 07/11/2018  PCP: [name], MD  Admit date: 07/11/2018  Discharge date: 07/12/2018  Admitted From:Home  Disposition:  Home  Recommendations for Outpatient Follow-up:  1. Follow up with PCP in 1-2 weeks 2. Continue home insulin regimen with refills prescribed  Home Health:None  Equipment/Devices:None  Discharge Condition:Stable  CODE STATUS: Full  Diet recommendation: Heart Healthy/Carb Modified  Brief/Interim Summary: Per HPI: Kenneth [name] is a 53 y.o. male with medical history significant for uncontrolled DM with medication noncompliance, frequent DKA admissions, depression, hypothyroidism, cocaine abuse, presented to the ED complaining that he had not had a bowel movement in 4 days.  Patient tells me he hurts all over from head to toe.  He also reports high blood sugars.  Reports compliance with his insulins but has a history of noncompliance. He denies sick contacts, has no cough or difficulty breathing.  No fever or chills.  At the time of my evaluation he denied suicidal ideations.  Patient was admitted with DKA and started on insulin drip and was given higher rate of IV fluid with resolution noted overnight.  He is tolerating his diet with no abdominal pain, nausea, or vomiting at this time.  His blood glucose is noted to be in the 200 range this morning and has been elevated in the 500 range, but with no symptomatology.  He refuses Lantus and outwardly refuses much of the care we are trying to provide at this point in time.  He has been evaluated by TTS and does not appear to have suicidal ideation and is otherwise competent to make decisions.  He is not participating in adequate care, but will receive IV insulin for better control of his blood glucose prior to discharge should he accept.  He has been advised to continue to use his home insulins as previously prescribed.  He will be given  resources for psychiatry evaluation to follow-up with in the outpatient setting.  He has also been given refills on all of his medications and has been encouraged to follow-up with his PCP in the next 1 week.  No other acute events otherwise noted during the course of this admission.  He is stable for discharge.  Unfortunately, he is a high risk for readmission given the fact that he is a poorly controlled diabetic with recent hemoglobin A1c of 12.7%.  This indicates that his usual mean blood glucose averages in the 300 range.  He was more than likely be also remained non-compliant with his regimen and suffers from depression.  Discharge Diagnoses:  Active Problems:   DKA (diabetic ketoacidoses) ([place])  Principal discharge diagnosis: DKA secondary to medication noncompliance.  Discharge Instructions  Discharge Instructions    Diet - low sodium heart healthy   Complete by:  As directed    Diet - low sodium heart healthy   Complete by:  As directed    Increase activity slowly   Complete by:  As directed    Increase activity slowly   Complete by:  As directed      Allergies as of 07/12/2018      Reactions   Metformin And Related Other (See Comments)   Abdominal cramps and diarrhea   Nsaids Nausea Only      Medication List    TAKE these medications   blood glucose meter kit and supplies Dispense based on patient and insurance preference. Use up to four times daily as directed. (FOR ICD-10 E10.9,  E11.9).   gabapentin 300 MG capsule Commonly known as:  NEURONTIN Take 1 capsule (300 mg total) by mouth 4 (four) times daily. What changed:  when to take this   insulin aspart 100 UNIT/ML injection Commonly known as:  NovoLOG Inject 10 Units into the skin 3 (three) times daily with meals for 30 days. What changed:  how much to take   insulin glargine 100 UNIT/ML injection Commonly known as:  LANTUS Inject 0.15 mLs (15 Units total) into the skin 2 (two) times daily. What changed:   how much to take   INSULIN SYRINGE .5CC/29G 29G X 1/2" 0.5 ML Misc Use as directed   lisinopril 2.5 MG tablet Commonly known as:  Zestril Take 1 tablet (2.5 mg total) by mouth daily for 30 days. What changed:  how much to take   multivitamin with minerals Tabs tablet Take 1 tablet by mouth daily.   nicotine 14 mg/24hr patch Commonly known as:  NICODERM CQ - dosed in mg/24 hours Place 1 patch (14 mg total) onto the skin daily.   oxyCODONE 5 MG immediate release tablet Commonly known as:  Oxy IR/ROXICODONE Take 5 mg by mouth 3 (three) times daily.   pantoprazole 40 MG tablet Commonly known as:  PROTONIX Take 40 mg by mouth daily.   senna-docusate 8.6-50 MG tablet Commonly known as:  Senokot-S Take 2 tablets by mouth daily at 8 pm for 30 days.      Follow-up Information    [name], MD Follow up in 1 week(s).   Specialty:  Internal Medicine Contact information: [place] [address] [phone]          Allergies  Allergen Reactions  . Metformin And Related Other (See Comments)    Abdominal cramps and diarrhea  . Nsaids Nausea Only    Consultations:  None   Procedures/Studies: Ct Abdomen Pelvis W Contrast  Result Date: 07/11/2018 CLINICAL DATA:  Has had a bowel movement in 4 days.  Abdominal pain. EXAM: CT ABDOMEN AND PELVIS WITH CONTRAST TECHNIQUE: Multidetector CT imaging of the abdomen and pelvis was performed using the standard protocol following bolus administration of intravenous contrast. CONTRAST:  162m OMNIPAQUE IOHEXOL 300 MG/ML  SOLN COMPARISON:  02/29/2016 FINDINGS: Lower chest: No acute abnormality. Hepatobiliary: No focal liver abnormality is seen. No gallstones, gallbladder wall thickening, or biliary dilatation. Pancreas: Unremarkable. No pancreatic ductal dilatation or surrounding inflammatory changes. Spleen: Normal in size without focal abnormality. Adrenals/Urinary Tract: Adrenal glands are unremarkable. Kidneys  are normal, without renal calculi, focal lesion, or hydronephrosis. Bladder is unremarkable. Stomach/Bowel: Stomach is within normal limits. Appendix appears normal. No evidence of bowel wall thickening, distention, or inflammatory changes. Moderate amount of stool in the ascending and transverse colon. Vascular/Lymphatic: Abdominal aortic atherosclerosis. No lymphadenopathy Reproductive: Prostate is unremarkable. Other: No abdominal wall hernia or abnormality. No abdominopelvic ascites. Musculoskeletal: No acute osseous abnormality. No aggressive osseous lesion. Mild degenerative disc disease with disc height loss at L5-S1. IMPRESSION: 1. No acute abdominal or pelvic pathology. 2. Moderate amount of stool in the ascending and transverse colon. Electronically Signed   By: HKathreen Devoid  On: 07/11/2018 17:12      Discharge Exam: Vitals:   07/12/18 0600 07/12/18 0744  BP: (!) 89/62   Pulse: 71   Resp: 16   Temp:  98.3 F (36.8 C)  SpO2: 98%    Vitals:   07/12/18 0400 07/12/18 0500 07/12/18 0600 07/12/18 0744  BP: (!) 87/62 (!) 84/53 (Marland Kitchen  89/62   Pulse: 74 73 71   Resp: _0 Temp: 98.2 F (36.8 C)   98.3 F (36.8 C)  TempSrc: Oral   Oral  SpO2: 97% 96% 98%   Weight:  54.4 kg    Height:        General: Pt is alert, awake, not in acute distress Cardiovascular: RRR, S1/S2 +, no rubs, no gallops Respiratory: CTA bilaterally, no wheezing, no rhonchi Abdominal: Soft, NT, ND, bowel sounds + Extremities: no edema, no cyanosis    The results of significant diagnostics from this hospitalization (including imaging, microbiology, ancillary and laboratory) are listed below for reference.     Microbiology: Recent Results (from the past 240 hour(s))  MRSA PCR Screening     Status: None   Collection Time: 07/11/18  7:59 PM  Result Value Ref Range Status   MRSA by PCR NEGATIVE NEGATIVE Final    Comment:        The GeneXpert MRSA Assay (FDA approved for NASAL specimens only), is one  component of a comprehensive MRSA colonization surveillance program. It is not intended to diagnose MRSA infection nor to guide or monitor treatment for MRSA infections. Performed at [facility], [address] [place], [place] [address]      Labs: BNP (last 3 results) No results for input(s): BNP in the last 8760 hours. Basic Metabolic Panel: Recent Labs  Lab 07/11/18 1515 07/11/18 1931 07/11/18 2256 07/12/18 0303  NA 120* 129* 132* 131*  K 5.0 4.2 3.9 3.9  CL 83* 96* 100 98  CO2 10* 15* 23 24  GLUCOSE 945* 516* 151* 232*  BUN 24* 21* 17 16  CREATININE 1.43* 1.33* 0.92 0.80  CALCIUM 8.4* 8.4* 8.3* 8.1*   Liver Function Tests: Recent Labs  Lab 07/11/18 1515  AST 22  ALT 21  ALKPHOS 67  BILITOT 2.2*  PROT 6.7  ALBUMIN 4.0   Recent Labs  Lab 07/11/18 1515  LIPASE 22   No results for input(s): AMMONIA in the last 168 hours. CBC: Recent Labs  Lab 07/11/18 1515  WBC 8.2  NEUTROABS 6.9  HGB 14.4  HCT 43.6  MCV 98.4  PLT 231   Cardiac Enzymes: No results for input(s): CKTOTAL, CKMB, CKMBINDEX, TROPONINI in the last 168 hours. BNP: Invalid input(s): POCBNP CBG: Recent Labs  Lab 07/11/18 2355 07/12/18 0057 07/12/18 0121 07/12/18 0257 07/12/18 0741  GLUCAP 281* 59* 59* 207* 536*   D-Dimer No results for input(s): DDIMER in the last 72 hours. Hgb A1c No results for input(s): HGBA1C in the last 72 hours. Lipid Profile No results for input(s): CHOL, HDL, LDLCALC, TRIG, CHOLHDL, LDLDIRECT in the last 72 hours. Thyroid function studies No results for input(s): TSH, T4TOTAL, T3FREE, THYROIDAB in the last 72 hours.  Invalid input(s): FREET3 Anemia work up No results for input(s): VITAMINB12, FOLATE, FERRITIN, TIBC, IRON, RETICCTPCT in the last 72 hours. Urinalysis    Component Value Date/Time   COLORURINE COLORLESS (A) 07/11/2018 1526   APPEARANCEUR CLEAR 07/11/2018 1526   LABSPEC 1.023 07/11/2018 1526   PHURINE 5.0 07/11/2018 1526   GLUCOSEU  >=500 (A) 07/11/2018 1526   HGBUR NEGATIVE 07/11/2018 1526   BILIRUBINUR NEGATIVE 07/11/2018 1526   KETONESUR 80 (A) 07/11/2018 1526   PROTEINUR NEGATIVE 07/11/2018 1526   UROBILINOGEN 0.2 01/18/2015 2300   NITRITE NEGATIVE 07/11/2018 1526   LEUKOCYTESUR NEGATIVE 07/11/2018 1526   Sepsis Labs Invalid input(s): PROCALCITONIN,  WBC,  LACTICIDVEN Microbiology Recent Results (from the past 240  hour(s))  MRSA PCR Screening     Status: None   Collection Time: 07/11/18  7:59 PM  Result Value Ref Range Status   MRSA by PCR NEGATIVE NEGATIVE Final    Comment:        The GeneXpert MRSA Assay (FDA approved for NASAL specimens only), is one component of a comprehensive MRSA colonization surveillance program. It is not intended to diagnose MRSA infection nor to guide or monitor treatment for MRSA infections. Performed at [facility], [address], [place], [place] [address]      Time coordinating discharge: 35 minutes  SIGNED:   Rodena Goldmann, DO [facility] 07/12/2018, 7:47 AM  If 7PM-7AM, please contact night-coverage [email or web address] Password [email or web address]

## 2018-07-12 NOTE — ED Provider Notes (Signed)
[facility] EMERGENCY DEPARTMENT Provider Note   CSN: [id number] Arrival date & time: 07/12/18  1035    History   Chief Complaint Chief Complaint  Patient presents with  . Suicidal    HPI Kenneth [name] is a 53 y.o. male with a PMH of Diabetes, chronic generalized pain, arthritis, thyroid disease, malnutrition, and noncompliance with medications presenting with suicidal thoughts with a plan to lay on the road. Patient was brought by security after laying on the road with hands folded behind his head. Patient was discharged from the ICU for refusing treatment and becoming hostile today. Patient denies any acute complaints. Patient reports he has attempted to commit suicide in the past, but states, "I do not want to talk about it." Patient reports hallucinations, but refuses to explain what he is hearing or seeing. Patient denies HI. Patient denies fever, cough, nausea, vomiting, abdominal pain, shortness of breath or chest pain. Patient is not very cooperative throughout history.     HPI  Past Medical History:  Diagnosis Date  . Arthritis   . Chronic generalized pain   . Diabetes mellitus   . DKA (diabetic ketoacidosis) ([place]) 07/2014  . Malnutrition ([place])   . Neuropathy   . Noncompliance with medication regimen   . Thyroid disease     Patient Active Problem List   Diagnosis Date Noted  . Hyperosmolar non-ketotic state in patient with type 2 diabetes mellitus ([place]) 06/06/2018  . Tobacco abuse 06/06/2018  . Malnutrition of moderate degree 10/19/2017  . Severe sepsis with septic shock (CODE) ([place]) 10/16/2017  . AKI (acute kidney injury) ([place]) 10/16/2017  . Esophagitis 10/16/2017  . Hypotension 06/16/2017  . Cocaine abuse ([place])   . Homeless   . Lactic acidosis   . DKA, type 2 ([place]) 06/22/2016  . Non compliance w medication regimen   . Protein-calorie malnutrition, severe 06/11/2016  . Chronic generalized pain 04/26/2016  . Neuropathy 04/26/2016  . Diabetes mellitus type  2, uncontrolled, with complications ([place])   . MDD (major depressive disorder), recurrent severe, without psychosis ([place]) 03/27/2016  . Depression, recurrent ([place])   . Abdominal pain   . Hyperglycemia 02/24/2016  . Suicidal ideation 02/23/2016  . Volume depletion 12/21/2015  . Metabolic acidosis 03/88/8280  . Diabetes mellitus with nonketotic hyperosmolarity ([place]) 09/21/2014  . Noncompliance with medication regimen   . Diabetes mellitus ([place])   . DKA (diabetic ketoacidoses) ([place]) 07/24/2014  . Dehydration 07/24/2014  . Acute renal failure ([place]) 07/24/2014  . Hyperkalemia 07/24/2014  . Hyponatremia 07/24/2014  . Hyperthyroidism 07/24/2014    History reviewed. No pertinent surgical history.      Home Medications    Prior to Admission medications   Medication Sig Start Date End Date Taking? Authorizing Provider  gabapentin (NEURONTIN) 300 MG capsule Take 1 capsule (300 mg total) by mouth 4 (four) times daily. 07/12/18  Yes Shah, Pratik D, DO  insulin aspart (NOVOLOG) 100 UNIT/ML injection Inject 10 Units into the skin 3 (three) times daily with meals for 30 days. 06/07/18 07/12/18 Yes Barton Dubois, MD  insulin glargine (LANTUS) 100 UNIT/ML injection Inject 0.16 mLs (16 Units total) into the skin 2 (two) times daily. 07/12/18  Yes Shah, Pratik D, DO  INSULIN SYRINGE .5CC/29G 29G X 1/2" 0.5 ML MISC Use as directed 07/12/18  Yes Shah, Pratik D, DO  lisinopril (PRINIVIL,ZESTRIL) 2.5 MG tablet Take 1 tablet (2.5 mg total) by mouth daily for 30 days. Patient taking differently: Take 5 mg by mouth daily.  05/05/18  07/12/18 Yes Johnson, Clanford L, MD  oxyCODONE (OXY IR/ROXICODONE) 5 MG immediate release tablet Take 5 mg by mouth 3 (three) times daily.   Yes [provider]  pantoprazole (PROTONIX) 40 MG tablet Take 40 mg by mouth daily.   Yes [provider]  senna-docusate (SENOKOT-S) 8.6-50 MG tablet Take 2 tablets by mouth daily at 8 pm for 30 days. 07/12/18 08/11/18 Yes Shah,  Pratik D, DO  blood glucose meter kit and supplies Dispense based on patient and insurance preference. Use up to four times daily as directed. (FOR ICD-10 E10.9, E11.9). Patient not taking: Reported on 07/11/2018 05/04/18   Irwin Brakeman L, MD  insulin aspart (NOVOLOG) 100 UNIT/ML injection Inject 12 Units into the skin 3 (three) times daily with meals for 30 days. Patient not taking: Reported on 07/12/2018 07/12/18 08/11/18  Heath Lark D, DO  Multiple Vitamin (MULTIVITAMIN WITH MINERALS) TABS tablet Take 1 tablet by mouth daily. 06/08/18   Barton Dubois, MD  nicotine (NICODERM CQ - DOSED IN MG/24 HOURS) 14 mg/24hr patch Place 1 patch (14 mg total) onto the skin daily. Patient not taking: Reported on 07/11/2018 06/08/18   Barton Dubois, MD    Family History Family History  Problem Relation Age of Onset  . Diabetes Mother   . Seizures Sister   . Diabetes Sister     Social History Social History   Tobacco Use  . Smoking status: Current Every Day Smoker    Packs/day: 0.50    Years: 8.00    Pack years: 4.00  . Smokeless tobacco: Never Used  Substance Use Topics  . Alcohol use: No  . Drug use: Yes    Types: Cocaine    Comment: states " I am not telling you when I did it, its not of your busy"     Allergies   Metformin and related and Nsaids   Review of Systems Review of Systems  Constitutional: Negative for chills, diaphoresis and fever.  Respiratory: Negative for cough and shortness of breath.   Cardiovascular: Negative for chest pain.  Gastrointestinal: Negative for abdominal pain, nausea and vomiting.  Endocrine: Negative for cold intolerance and heat intolerance.  Genitourinary: Negative for dysuria.  Musculoskeletal: Negative for back pain.  Skin: Negative for rash.  Allergic/Immunologic: Negative for immunocompromised state.  Hematological: Negative for adenopathy.  Psychiatric/Behavioral: Positive for agitation, hallucinations and suicidal ideas.     Physical  Exam Updated Vital Signs BP (!) 98/59 (BP Location: Right Arm)   Pulse 81   Temp 98.2 F (36.8 C)   Resp 16   Ht '5\' 8"'$  (1.727 m)   Wt 54.4 kg   SpO2 100%   BMI 18.24 kg/m   Physical Exam Vitals signs and nursing note reviewed.  Constitutional:      General: He is not in acute distress.    Appearance: He is well-developed. He is not diaphoretic.  HENT:     Head: Normocephalic and atraumatic.  Neck:     Musculoskeletal: Normal range of motion and neck supple.  Cardiovascular:     Rate and Rhythm: Normal rate and regular rhythm.     Heart sounds: Normal heart sounds. No murmur. No friction rub. No gallop.   Pulmonary:     Effort: Pulmonary effort is normal. No respiratory distress.     Breath sounds: Normal breath sounds. No wheezing or rales.  Abdominal:     Palpations: Abdomen is soft.     Tenderness: There is no abdominal tenderness.  Musculoskeletal:  Normal range of motion.  Skin:    General: Skin is warm.     Findings: No erythema, lesion or rash.  Neurological:     Mental Status: He is alert and oriented to person, place, and time.  Psychiatric:        Attention and Perception: Attention normal. He is attentive.        Mood and Affect: Mood is not anxious or depressed. Affect is angry. Affect is not labile, blunt or inappropriate.        Speech: Speech normal.        Behavior: Behavior is agitated.        Thought Content: Thought content includes suicidal ideation. Thought content does not include homicidal ideation. Thought content includes suicidal plan. Thought content does not include homicidal plan.        Judgment: Judgment is impulsive.    ED Treatments / Results  Labs (all labs ordered are listed, but only abnormal results are displayed) Labs Reviewed  COMPREHENSIVE METABOLIC PANEL - Abnormal; Notable for the following components:      Result Value   Sodium 127 (*)    Chloride 93 (*)    CO2 18 (*)    Glucose, Bld 567 (*)    Calcium 8.3 (*)    Total  Protein 5.7 (*)    Anion gap 16 (*)    All other components within normal limits  RAPID URINE DRUG SCREEN, HOSP PERFORMED - Abnormal; Notable for the following components:   Cocaine POSITIVE (*)    All other components within normal limits  CBC WITH DIFFERENTIAL/PLATELET - Abnormal; Notable for the following components:   RBC 3.99 (*)    HCT 37.7 (*)    All other components within normal limits  URINALYSIS, ROUTINE W REFLEX MICROSCOPIC - Abnormal; Notable for the following components:   Color, Urine STRAW (*)    Glucose, UA >=500 (*)    Ketones, ur 80 (*)    All other components within normal limits  BLOOD GAS, VENOUS - Abnormal; Notable for the following components:   pH, Ven 7.240 (*)    pCO2, Ven 38.8 (*)    Bicarbonate 15.7 (*)    Acid-base deficit 9.9 (*)    All other components within normal limits  CULTURE, BLOOD (ROUTINE X 2)  CULTURE, BLOOD (ROUTINE X 2)  ETHANOL  BASIC METABOLIC PANEL  BASIC METABOLIC PANEL  BASIC METABOLIC PANEL  BASIC METABOLIC PANEL  CBC  BETA-HYDROXYBUTYRIC ACID  RAPID URINE DRUG SCREEN, HOSP PERFORMED    EKG None  Radiology Ct Abdomen Pelvis W Contrast  Result Date: 07/11/2018 CLINICAL DATA:  Has had a bowel movement in 4 days.  Abdominal pain. EXAM: CT ABDOMEN AND PELVIS WITH CONTRAST TECHNIQUE: Multidetector CT imaging of the abdomen and pelvis was performed using the standard protocol following bolus administration of intravenous contrast. CONTRAST:  165m OMNIPAQUE IOHEXOL 300 MG/ML  SOLN COMPARISON:  02/29/2016 FINDINGS: Lower chest: No acute abnormality. Hepatobiliary: No focal liver abnormality is seen. No gallstones, gallbladder wall thickening, or biliary dilatation. Pancreas: Unremarkable. No pancreatic ductal dilatation or surrounding inflammatory changes. Spleen: Normal in size without focal abnormality. Adrenals/Urinary Tract: Adrenal glands are unremarkable. Kidneys are normal, without renal calculi, focal lesion, or hydronephrosis.  Bladder is unremarkable. Stomach/Bowel: Stomach is within normal limits. Appendix appears normal. No evidence of bowel wall thickening, distention, or inflammatory changes. Moderate amount of stool in the ascending and transverse colon. Vascular/Lymphatic: Abdominal aortic atherosclerosis. No lymphadenopathy Reproductive: Prostate is unremarkable. Other:  No abdominal wall hernia or abnormality. No abdominopelvic ascites. Musculoskeletal: No acute osseous abnormality. No aggressive osseous lesion. Mild degenerative disc disease with disc height loss at L5-S1. IMPRESSION: 1. No acute abdominal or pelvic pathology. 2. Moderate amount of stool in the ascending and transverse colon. Electronically Signed   By: Kathreen Devoid   On: 07/11/2018 17:12    Procedures Procedures (including critical care time)  Medications Ordered in ED Medications  0.9 %  sodium chloride infusion (has no administration in time range)  dextrose 5 %-0.45 % sodium chloride infusion (has no administration in time range)  insulin regular, human (MYXREDLIN) 100 units/ 100 mL infusion (has no administration in time range)  enoxaparin (LOVENOX) injection 40 mg (has no administration in time range)  potassium chloride 10 mEq in 100 mL IVPB (has no administration in time range)  sodium chloride 0.9 % bolus 1,000 mL (1,000 mLs Intravenous New Bag/Given 07/12/18 1340)     Initial Impression / Assessment and Plan / ED Course  I have reviewed the triage vital signs and the nursing notes.  Pertinent labs & imaging results that were available during my care of the patient were reviewed by me and considered in my medical decision making (see chart for details).  Clinical Course as of Jul 12 1443  Wed Jul 12, 2018  1220 CMP reveals hyponatremia at 127, low Chloride at 93, low CO2 at 18, hyperglycemia at 567, and anion gap of 16.  Glucose(!!): 567 [AH]  1357 UA reveals ketones and glucose.   Ketones, ur(!): 80 [AH]    Clinical Course  User Index [AH] Arville Lime, PA-C      Patient presents with suicidal thoughts. Lab work reveals hyperglycemia at 567, low CO2 at 18, and ketones in urine consistent with DKA. Patient refused treatment and stated he was going to leave the ER to lay on the road. Patient was IVC'd. Patient is now cooperative at this time. IVF provided. Will consult hospitalist for admission. Hospitalist, Dr. Wynetta Emery, has agreed to admit patient.   Findings and plan of care discussed with supervising physician Dr. Alvino Chapel.  Final Clinical Impressions(s) / ED Diagnoses   Final diagnoses:  Suicidal ideation  Diabetic ketoacidosis without coma associated with type 2 diabetes mellitus [facility])    ED Discharge Orders    None       Arville Lime, [place] 07/12/18 1446    Davonna Belling, MD 07/12/18 1510

## 2018-07-12 NOTE — Progress Notes (Signed)
Pt continuously asking for food and stating he will take his insulin. Adv patient that he can not have anything to eat while he is on the insulin gtt, pt demanding that I page the doctor to get him off the gtt since his sugar is 178. Pts CO2=15, adv we will wait to see the next lab values before making any decisions. Pt angry stating we are starving him and he hasn't eaten all day. Nurse adv that we are trying to take care of him if he will let us. Pt continuously calling out to get food after being educated. Will continue to monitor blood sugar, lab work and patient.

## 2018-07-12 NOTE — TOC Transition Note (Signed)
Transition of Care [facility]) - CM/SW Discharge Note   Patient Details  Name: NHAN MARANDOLA MRN: [id number] Date of Birth: [birth year]  Transition of Care [facility]) CM/SW Contact:  Malcolm Metro, RN Phone Number: 07/12/2018, 10:24 AM   Clinical Narrative: CM consult for substance abuse and homelessness. Pt agitated and aggressive. Pt well known by [facility]'s TOC department. Pt does not want to follow hospital orders and does not want to be discharged. Pt threatens suicide if discharged. Pt has been assessed by TTS who does not feel pt meets criteria for inpt admission to a [facility]. Pt does not want to be discharged because he will not have food or housing. Pt gets a check which he rapidly uses on drugs and ETOH. Denied substance abuse but tested cocaine +. At baseline pt does not have his own home but stays with friends or sometimes family. He has received counseling on resources multiple times over the past years by this [facility] team. Pt given resources for substance abuse and housing (including homeless shelters) by this CM. Pt being escorted out by [employer].    Expected Discharge Plan: Home/Self Care    Expected Discharge Plan and Services Expected Discharge Plan: Home/Self Care       Living arrangements for the past 2 months: Homeless Expected Discharge Date: 07/12/18                     Prior Living Arrangements/Services Living arrangements for the past 2 months: Homeless   Activities of Daily Living Home Assistive Devices/Equipment: None ADL Screening (condition at time of admission) Patient's cognitive ability adequate to safely complete daily activities?: Yes Is the patient deaf or have difficulty hearing?: No Does the patient have difficulty seeing, even when wearing glasses/contacts?: No Does the patient have difficulty concentrating, remembering, or making decisions?: No Patient able to express need for assistance with ADLs?: Yes Does the patient have difficulty dressing or bathing?:  No Independently performs ADLs?: Yes (appropriate for developmental age) Does the patient have difficulty walking or climbing stairs?: No Weakness of Legs: Both Weakness of Arms/Hands: None  Emotional Assessment Appearance:: Disheveled Attitude/Demeanor/Rapport: Aggressive (Verbally and/or physically) Affect (typically observed): Explosive, Agitated, Angry Orientation: : Oriented to Place, Oriented to  Time, Oriented to Situation, Oriented to Self Alcohol / Substance Use: Illicit Drugs Psych Involvement: Yes (comment)  Admission diagnosis:  Diabetic ketoacidosis without coma associated with type 1 diabetes mellitus (HCC) [E10.10] Patient Active Problem List   Diagnosis Date Noted  . Hyperosmolar non-ketotic state in patient with type 2 diabetes mellitus (HCC) 06/06/2018  . Tobacco abuse 06/06/2018  . Malnutrition of moderate degree 10/19/2017  . Severe sepsis with septic shock (CODE) (HCC) 10/16/2017  . AKI (acute kidney injury) (HCC) 10/16/2017  . Esophagitis 10/16/2017  . Hypotension 06/16/2017  . Cocaine abuse (HCC)   . Homeless   . Lactic acidosis   . DKA, type 2 (HCC) 06/22/2016  . Non compliance w medication regimen   . Protein-calorie malnutrition, severe 06/11/2016  . Chronic generalized pain 04/26/2016  . Neuropathy 04/26/2016  . Diabetes mellitus type 2, uncontrolled, with complications (HCC)   . 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 Depression, recurrent (HCC)   . Abdominal pain   . Hyperglycemia 02/24/2016  . Volume depletion 12/21/2015  . Metabolic acidosis 12/21/2015  . Diabetes mellitus with nonketotic hyperosmolarity (HCC) 09/21/2014  . Noncompliance with medication regimen   . Diabetes mellitus (HCC)   . DKA (diabetic ketoacidoses) (  HCC) 07/24/2014  . Dehydration 07/24/2014  . Acute renal failure (HCC) 07/24/2014  . Hyperkalemia 07/24/2014  . Hyponatremia 07/24/2014  . Hyperthyroidism 07/24/2014    PCP:  Oval Linseyondiego, [name], MD Pharmacy:   [facility] [place] [place] - [address] [place] [address] Phone: [phone] Fax: [phone]  [facility] - [place], [place] - [address]  [place] [address] Phone: [phone] Fax: [phone]  [facility] [place] [place] [address] Phone: [phone] Fax: [phone]   Readmission Risk Interventions Readmission Risk Prevention Plan 07/12/2018  Transportation Screening Complete  PCP or Specialist Appt within 3-5 Days Not Complete  Not Complete comments pt argumentative and being escorted out by police  HRI or Home Care Consult Complete  Palliative Care Screening Not Applicable  Medication Review (RN Care Manager) Complete  Some recent data might be hidden

## 2018-07-12 NOTE — H&P (Addendum)
History and Physical  Kenneth [name] KCL:[id number] DOB: [birth year] DOA: 07/12/2018  Referring physician: Jerilee Hoh  PCP: [name], MD   Chief Complaint: suicical   HPI: Kenneth [name] is a 53 y.o. male with prior known mental illness, polysubstance abuser, homeless was discharged earlier today from [facility] due to his refusal to receive treatments apparently became more mentally disturbed later today and presented back to ED with severe agitation, suicidal ideation and was IVCd.  Unfortunately he had refused to take his insulin and now is back in DKA after just having recently recovered.  He has had multiple admission for the same and has long history of medical noncompliance and his A1c is greater than 12%.  Earlier today he had refused to allow the nurses to give him his scheduled lantus and became combative and severely agitated.    ED Course:  Pt says that he was homeless.  He threatened to lie in middle of the stress.  He complains that he has not been getting proper care for his diabetes at this hospital.  He initially had refused to allow treatments but has subsequently allowed treatments to begin including IV, IV fluids and IV insulin.  He was noted to yell expletives repeatedly.  He was noted to have BS>600.  He was positive for ketones, he was acidotic with pH of 7.240, he had an anion gap of 16 and CO2 of 18.  He was IVC'd for suicidal ideation.  He was started on IV insulin infusion after initially refusing for IV placement.  He will be admitted for medical stabilization and clearance and then subsequent intensive inpatient psychiatric treatments.   Review of Systems: Pt not cooperating for history.   Past Medical History:  Diagnosis Date   Arthritis    Chronic generalized pain    Diabetes mellitus    DKA (diabetic ketoacidosis) ([place]) 07/2014   Malnutrition ([place])    Neuropathy    Noncompliance with medication regimen    Thyroid disease    History reviewed. No  pertinent surgical history. Social History:  reports that he has been smoking. He has a 4.00 pack-year smoking history. He has never used smokeless tobacco. He reports current drug use. Drug: Cocaine. He reports that he does not drink alcohol.  Allergies  Allergen Reactions   Metformin And Related Other (See Comments)    Abdominal cramps and diarrhea   Nsaids Nausea Only    Family History  Problem Relation Age of Onset   Diabetes Mother    Seizures Sister    Diabetes Sister     Prior to Admission medications   Medication Sig Start Date End Date Taking? Authorizing Provider  gabapentin (NEURONTIN) 300 MG capsule Take 1 capsule (300 mg total) by mouth 4 (four) times daily. 07/12/18  Yes Shah, Pratik D, DO  insulin aspart (NOVOLOG) 100 UNIT/ML injection Inject 10 Units into the skin 3 (three) times daily with meals for 30 days. 06/07/18 07/12/18 Yes Barton Dubois, MD  insulin glargine (LANTUS) 100 UNIT/ML injection Inject 0.16 mLs (16 Units total) into the skin 2 (two) times daily. 07/12/18  Yes Shah, Pratik D, DO  INSULIN SYRINGE .5CC/29G 29G X 1/2" 0.5 ML MISC Use as directed 07/12/18  Yes Shah, Pratik D, DO  lisinopril (PRINIVIL,ZESTRIL) 2.5 MG tablet Take 1 tablet (2.5 mg total) by mouth daily for 30 days. Patient taking differently: Take 5 mg by mouth daily.  05/05/18 07/12/18 Yes Catalyna Reilly L, MD  oxyCODONE (OXY IR/ROXICODONE) 5 MG immediate  release tablet Take 5 mg by mouth 3 (three) times daily.   Yes [provider]  pantoprazole (PROTONIX) 40 MG tablet Take 40 mg by mouth daily.   Yes [provider]  senna-docusate (SENOKOT-S) 8.6-50 MG tablet Take 2 tablets by mouth daily at 8 pm for 30 days. 07/12/18 08/11/18 Yes Shah, Pratik D, DO  blood glucose meter kit and supplies Dispense based on patient and insurance preference. Use up to four times daily as directed. (FOR ICD-10 E10.9, E11.9). Patient not taking: Reported on 07/11/2018 05/04/18   Irwin Brakeman  L, MD  insulin aspart (NOVOLOG) 100 UNIT/ML injection Inject 12 Units into the skin 3 (three) times daily with meals for 30 days. Patient not taking: Reported on 07/12/2018 07/12/18 08/11/18  Heath Lark D, DO  Multiple Vitamin (MULTIVITAMIN WITH MINERALS) TABS tablet Take 1 tablet by mouth daily. 06/08/18   Barton Dubois, MD  nicotine (NICODERM CQ - DOSED IN MG/24 HOURS) 14 mg/24hr patch Place 1 patch (14 mg total) onto the skin daily. Patient not taking: Reported on 07/11/2018 06/08/18   Barton Dubois, MD   Physical Exam: Vitals:   07/12/18 1047 07/12/18 1053 07/12/18 1359  BP:   (!) 98/59  Pulse:   81  Resp:   16  Temp:  98.2 F (36.8 C)   SpO2:   100%  Weight: 54.4 kg    Height: '5\' 8"'$  (1.727 m)      General exam: thin male, agitated, in no apparent distress.  Head, eyes and ENT: Nontraumatic and normocephalic. Pupils equally reacting to light and accommodation. Oral mucosa dry.  Neck: Supple. No JVD, carotid bruit or thyromegaly.  Lymphatics: No lymphadenopathy.  Respiratory system: Clear to auscultation. No increased work of breathing.  Cardiovascular system: S1 and S2 heard, RRR. No JVD, murmurs, gallops, clicks or pedal edema.  Gastrointestinal system: Abdomen is nondistended, soft and nontender. Normal bowel sounds heard. No organomegaly or masses appreciated.  Central nervous system: Alert and oriented. No focal neurological deficits.  Extremities: Symmetric 5 x 5 power. Peripheral pulses symmetrically felt.   Skin: No rashes or acute findings.  Musculoskeletal system: Negative exam.  Psychiatry: Agitated and uncooperative. Hallucinations, agitation and suicidal ideation present.   Labs on Admission:  Basic Metabolic Panel: Recent Labs  Lab 07/11/18 1515 07/11/18 1931 07/11/18 2256 07/12/18 0303 07/12/18 1120  NA 120* 129* 132* 131* 127*  K 5.0 4.2 3.9 3.9 4.7  CL 83* 96* 100 98 93*  CO2 10* 15* 23 24 18*  GLUCOSE 945* 516* 151* 232* 567*  BUN 24* 21*  '17 16 18  '$ CREATININE 1.43* 1.33* 0.92 0.80 1.03  CALCIUM 8.4* 8.4* 8.3* 8.1* 8.3*   Liver Function Tests: Recent Labs  Lab 07/11/18 1515 07/12/18 1120  AST 22 19  ALT 21 18  ALKPHOS 67 60  BILITOT 2.2* 1.2  PROT 6.7 5.7*  ALBUMIN 4.0 3.5   Recent Labs  Lab 07/11/18 1515  LIPASE 22   No results for input(s): AMMONIA in the last 168 hours. CBC: Recent Labs  Lab 07/11/18 1515 07/12/18 1120  WBC 8.2 6.1  NEUTROABS 6.9 4.3  HGB 14.4 13.0  HCT 43.6 37.7*  MCV 98.4 94.5  PLT 231 189   Cardiac Enzymes: No results for input(s): CKTOTAL, CKMB, CKMBINDEX, TROPONINI in the last 168 hours.  BNP (last 3 results) No results for input(s): PROBNP in the last 8760 hours. CBG: Recent Labs  Lab 07/12/18 0057 07/12/18 0121 07/12/18 0257 07/12/18 0741 07/12/18 0902  GLUCAP 59* 59* 207* 536* 410*    Radiological Exams on Admission: Ct Abdomen Pelvis W Contrast  Result Date: 07/11/2018 CLINICAL DATA:  Has had a bowel movement in 4 days.  Abdominal pain. EXAM: CT ABDOMEN AND PELVIS WITH CONTRAST TECHNIQUE: Multidetector CT imaging of the abdomen and pelvis was performed using the standard protocol following bolus administration of intravenous contrast. CONTRAST:  139m OMNIPAQUE IOHEXOL 300 MG/ML  SOLN COMPARISON:  02/29/2016 FINDINGS: Lower chest: No acute abnormality. Hepatobiliary: No focal liver abnormality is seen. No gallstones, gallbladder wall thickening, or biliary dilatation. Pancreas: Unremarkable. No pancreatic ductal dilatation or surrounding inflammatory changes. Spleen: Normal in size without focal abnormality. Adrenals/Urinary Tract: Adrenal glands are unremarkable. Kidneys are normal, without renal calculi, focal lesion, or hydronephrosis. Bladder is unremarkable. Stomach/Bowel: Stomach is within normal limits. Appendix appears normal. No evidence of bowel wall thickening, distention, or inflammatory changes. Moderate amount of stool in the ascending and transverse colon.  Vascular/Lymphatic: Abdominal aortic atherosclerosis. No lymphadenopathy Reproductive: Prostate is unremarkable. Other: No abdominal wall hernia or abnormality. No abdominopelvic ascites. Musculoskeletal: No acute osseous abnormality. No aggressive osseous lesion. Mild degenerative disc disease with disc height loss at L5-S1. IMPRESSION: 1. No acute abdominal or pelvic pathology. 2. Moderate amount of stool in the ascending and transverse colon. Electronically Signed   By: HKathreen Devoid  On: 07/11/2018 17:12   Assessment/Plan Principal Problem:   D[place] (diabetic ketoacidoses) ([place] Active Problems:   Dehydration   Noncompliance with medication regimen   Suicidal ideation   Depression, recurrent ([place]   MDD (major depressive disorder), recurrent severe, without psychosis ([place]   Cocaine abuse ([place]   Tobacco abuse  1. Diabetic ketoacidosis - Pt had refused to take his lantus this morning prior to being discharged from hospital and had consistently refused treatments.  He now presents with recurrent hyperglycemia, acidosis, elevated ion gap and ketones in urine.  He will be restarted on IV insulin and DKA protocol.  He is mentally unstable and had been IVC'd.  When he can be medically stabilized he will need inpatient psychiatric treatments.   2. Suicidal ideation - Pt has been IVC'd and will need inpatient psychiatric treatments.  He will need a 1 to 1 sitter at bedside for active suicide precautions.   3. Dehydration - IV fluids ordered.   4. Poorly controlled diabetes mellitus - from longstanding poor compliance with treatments as evidenced by A1c >12%.   5. Homelessness - will consult TOC teams.   6. Polysubstance abuse - Pt is cocaine positive.  Avoid beta blockers.   7. Hypotension - likely from dehydration - treating with IV fluids.  Follow. Hold BP meds.  8. GERD - protonix ordered for GI protection.   9. Tobacco abuse - will offer nicotine patch. 10. Painful diabetic neuropathy - holding  gabapentin.    DVT Prophylaxis: lovenox Code Status: Full   Family Communication: patient   Disposition Plan: inpatient   Critical CareTime spent: 567mins  Hurshel Bouillon JWynetta Emery MD [facility] How to contact the [facility] YorkAttending or Consulting provider [place] covering provider during after hours [place] for this patient?  1. Check the care team in [facility] look for a) attending/consulting TR[facility] provider listed and b) the [facility] listed 2. Log into [email or web address] and use [place] universal password to access. If you do not have the password, please contact the hospital operator. 3. Locate the TM[facility] Sherman Blvdprovider you are looking for under [facility] and page to  a number that you can be directly reached. 4. If you still have difficulty reaching the provider, please page the [facility] (Director on Call) for the Hospitalists listed on amion for assistance.

## 2018-07-12 NOTE — BH Assessment (Signed)
Tele Assessment Note   Patient Name: Kenneth [name] MRN: [id number] Referring Physician: Dr. Tennis Must [facility] Location of Provider: [place] is an 53 y.o. male presenting initially with high blood sugar. While in the ED patient made statements of planning to hurt himself with no plan. Patient reported increased depression due to medical reasons and being homeless. Patient admitting to [facility] with no plan. Patient was upset during assessment stating "I known my body and what it needs, they messed up by giving me insulin, I need to eat". Patient denied HI and reported hearing his mothers voice along with others voice and having nightmares which is why patient does not sleep at night, he reported only sleeping in the daytime. Patient reported dreams are about dying. Patient reported poor appetite and inability to keep food down. Patient reported inpatient mental health treatment 3 years ago for 13 days at [facility] due to hearing voices and grief/loss issues over mothers death. When asked about suicide attempts, patient stated "don't know". Patient reported increased depressive symptoms, crying spells, poor sleep, fatigue, loss of interest and worthlessness. Patient denied self-harming behaviors. Patient denied access to gun or weapons. TTS clinician asked patient more than once regarding plan, patient denied.   Patient reported being homeless. Patient reported being divorced and having 1 daughter. Patient reported unable to see grand baby for past 5-6 years "that makes me sad". Patient reported everyone doesn't care about him and it hurts, as he would give the shirt off his back to help them. Patient denied receiving any mental health resources. Patient denied HI and drug and alcohol abuse. Patient was cooperative during assessment.   UDS +cocaine ETOH pending  Diagnosis: Major depressive disorder  Past Medical History:  Past Medical History:   Diagnosis Date  . Arthritis   . Chronic generalized pain   . Diabetes mellitus   . DKA (diabetic ketoacidosis) ([place]) 07/2014  . Malnutrition ([place])   . Neuropathy   . Noncompliance with medication regimen   . Thyroid disease     History reviewed. No pertinent surgical history.  Family History:  Family History  Problem Relation Age of Onset  . Diabetes Mother   . Seizures Sister   . Diabetes Sister     Social History:  reports that he has been smoking. He has a 4.00 pack-year smoking history. He has never used smokeless tobacco. He reports current drug use. Drug: Cocaine. He reports that he does not drink alcohol.  Additional Social History:  Alcohol / Drug Use Pain Medications: see MAR Prescriptions: see MAR Over the Counter: see MAR  CIWA: CIWA-Ar BP: (!) 98/58(BP recheck) Pulse Rate: 78 COWS:    Allergies:  Allergies  Allergen Reactions  . Metformin And Related Other (See Comments)    Abdominal cramps and diarrhea  . Nsaids Nausea Only    Home Medications:  Medications Prior to Admission  Medication Sig Dispense Refill  . gabapentin (NEURONTIN) 300 MG capsule Take 1 capsule (300 mg total) by mouth 4 (four) times daily. (Patient taking differently: Take 300 mg by mouth 3 (three) times daily. ) 120 capsule 1  . insulin aspart (NOVOLOG) 100 UNIT/ML injection Inject 10 Units into the skin 3 (three) times daily with meals for 30 days. (Patient taking differently: Inject 12 Units into the skin 3 (three) times daily with meals. ) 10 mL 2  . insulin glargine (LANTUS) 100 UNIT/ML injection Inject 0.15 mLs (15 Units total)  into the skin 2 (two) times daily. (Patient taking differently: Inject 16 Units into the skin 2 (two) times daily. ) 20 mL 1  . INSULIN SYRINGE .5CC/29G 29G X 1/2" 0.5 ML MISC Use as directed 100 each 0  . lisinopril (PRINIVIL,ZESTRIL) 2.5 MG tablet Take 1 tablet (2.5 mg total) by mouth daily for 30 days. (Patient taking differently: Take 5 mg by mouth  daily. ) 30 tablet 0  . Multiple Vitamin (MULTIVITAMIN WITH MINERALS) TABS tablet Take 1 tablet by mouth daily. 30 tablet 1  . oxyCODONE (OXY IR/ROXICODONE) 5 MG immediate release tablet Take 5 mg by mouth 3 (three) times daily.    . pantoprazole (PROTONIX) 40 MG tablet Take 40 mg by mouth daily.    . blood glucose meter kit and supplies Dispense based on patient and insurance preference. Use up to four times daily as directed. (FOR ICD-10 E10.9, E11.9). (Patient not taking: Reported on 07/11/2018) 1 each 0  . nicotine (NICODERM CQ - DOSED IN MG/24 HOURS) 14 mg/24hr patch Place 1 patch (14 mg total) onto the skin daily. (Patient not taking: Reported on 07/11/2018) 28 patch 1    OB/GYN Status:  No LMP for male patient.  General Assessment Data Location of Assessment: [facility] TTS Assessment: In system Is this a Tele or Face-to-Face Assessment?: Tele Assessment Is this an Initial Assessment or a Re-assessment for this encounter?: Initial Assessment Patient Accompanied by:: N/A Language Other than English: No Living Arrangements: Homeless/Shelter What gender do you identify as?: Male Marital status: Divorced Living Arrangements: (homeless) Can pt return to current living arrangement?: Yes Admission Status: Voluntary Is patient capable of signing voluntary admission?: Yes Referral Source: Self/Family/Friend     Crisis Care Plan Living Arrangements: (homeless) Legal Guardian: (self) Name of Psychiatrist: (none) Name of Therapist: (none)  Education Status Is patient currently in school?: No Is the patient employed, unemployed or receiving disability?: Unemployed  Risk to self with the past 6 months Suicidal Ideation: Yes-Currently Present Has patient been a risk to self within the past 6 months prior to admission? : Yes Suicidal Intent: Yes-Currently Present Has patient had any suicidal intent within the past 6 months prior to admission? : Yes Is patient at risk for suicide?:  Yes Suicidal Plan?: No Has patient had any suicidal plan within the past 6 months prior to admission? : Yes Specify Current Suicidal Plan: (denied) Access to Means: (denied) What has been your use of drugs/alcohol within the last 12 months?: (denied) Previous Attempts/Gestures: ("don't know") How many times?: ("dont know") Other Self Harm Risks: (none) Triggers for Past Attempts: (grief loss issues and hallucinations) Intentional Self Injurious Behavior: None Family Suicide History: Yes(uncles) Recent stressful life event(s): (medical issues and homeless) Persecutory voices/beliefs?: No Depression: Yes Depression Symptoms: Insomnia, Tearfulness, Isolating, Fatigue, Guilt, Loss of interest in usual pleasures, Feeling worthless/self pity, Feeling angry/irritable Substance abuse history and/or treatment for substance abuse?: No Suicide prevention information given to non-admitted patients: Not applicable  Risk to Others within the past 6 months Homicidal Ideation: No Does patient have any lifetime risk of violence toward others beyond the six months prior to admission? : No Thoughts of Harm to Others: No Current Homicidal Intent: No Current Homicidal Plan: No Access to Homicidal Means: No Identified Victim: (n/a) History of harm to others?: No Assessment of Violence: None Noted Violent Behavior Description: (none) Does patient have access to weapons?: No Criminal Charges Pending?: No Does patient have a court date: No Is patient on probation?: No  Psychosis Hallucinations: Auditory Delusions: None noted  Mental Status Report Appearance/Hygiene: Unremarkable Eye Contact: Poor Motor Activity: Freedom of movement Speech: Logical/coherent, Argumentative Level of Consciousness: Drowsy Mood: Depressed, Anxious, Irritable Affect: Anxious, Depressed, Irritable Anxiety Level: Moderate Thought Processes: Coherent, Relevant Judgement: Partial Orientation: Person, Place, Time,  Situation Obsessive Compulsive Thoughts/Behaviors: None  Cognitive Functioning Concentration: Fair Memory: Recent Intact Is patient IDD: No Insight: Fair Impulse Control: Poor Appetite: Poor Have you had any weight changes? : No Change Sleep: (0 at night only sleep during day, unsure of hours) Total Hours of Sleep: (0 at night) Vegetative Symptoms: Decreased grooming  ADLScreening [facility] Assessment Services) Patient's cognitive ability adequate to safely complete daily activities?: Yes Patient able to express need for assistance with ADLs?: Yes Independently performs ADLs?: Yes (appropriate for developmental age)  Prior Inpatient Therapy Prior Inpatient Therapy: Yes([facility] 3 years ago) Prior Therapy Dates: (3 years ago) Prior Therapy Facilty/Provider(s): (unknown) Reason for Treatment: (grief loss and auditory hallucinations)  Prior Outpatient Therapy Prior Outpatient Therapy: No Does patient have an ACCT team?: No Does patient have Intensive In-House Services?  : No Does patient have [facility] services? : No Does patient have P4CC services?: No  ADL Screening (condition at time of admission) Patient's cognitive ability adequate to safely complete daily activities?: Yes Is the patient deaf or have difficulty hearing?: No Does the patient have difficulty seeing, even when wearing glasses/contacts?: No Does the patient have difficulty concentrating, remembering, or making decisions?: No Patient able to express need for assistance with ADLs?: Yes Does the patient have difficulty dressing or bathing?: No Independently performs ADLs?: Yes (appropriate for developmental age) Does the patient have difficulty walking or climbing stairs?: No Weakness of Legs: Both Weakness of Arms/Hands: None  Home Assistive Devices/Equipment Home Assistive Devices/Equipment: None  Therapy Consults (therapy consults require a physician order) PT Evaluation Needed: No OT Evalulation Needed: No SLP  Evaluation Needed: No Abuse/Neglect Assessment (Assessment to be complete while patient is alone) Abuse/Neglect Assessment Can Be Completed: Yes Physical Abuse: Denies Verbal Abuse: Denies Sexual Abuse: Denies Exploitation of patient/patient's resources: Denies Self-Neglect: Denies Values / Beliefs Cultural Requests During Hospitalization: None Spiritual Requests During Hospitalization: None Consults Spiritual Care Consult Needed: No Social Work Consult Needed: No Regulatory affairs officer (For Healthcare) Does Patient Have a Medical Advance Directive?: No Would patient like information on creating a medical advance directive?: No - Patient declined Nutrition Screen- [facility] Adult/[facility] Patient's home diet: Regular(pt refusing to eat a modified diet in the hospital) Has the patient recently lost weight without trying?: No Has the patient been eating poorly because of a decreased appetite?: No Malnutrition Screening Tool Score: 0  Disposition:  Disposition Initial Assessment Completed for this Encounter: Yes  Patriciaann Clan, PA, patient does not meet inpatient criteria. TTS to fax outpatient therapy resources to patient. Wynell Balloon, RN, informed of disposition.   This service was provided via telemedicine using a 2-way, interactive audio and video technology.  Names of all persons participating in this telemedicine service and their role in this encounter. Name: Molli Posey Role: Patient  Name: Kirtland Bouchard, [facility] Role: TTS Clinician  Name:  Role:   Name:  Role:     Venora Maples, [facility] 07/12/2018 2:51 AM

## 2018-07-12 NOTE — Progress Notes (Signed)
Pt is refusing dextrose, Lantus, warm-up meal. Dr. Robb Matar paged and made aware that pt is refusing all treatment. Order to increase IVF to 165ml/hr.

## 2018-07-12 NOTE — ED Notes (Signed)
Donell Sievert, PA, patient does not meet inpatient criteria. TTS to fax outpatient therapy resources to patient. Corinne Ports, RN, informed of disposition.

## 2018-07-12 NOTE — Progress Notes (Signed)
Pt upset and stating he hasn't received his pain medication "every 8 hours" as he should have. RN adv pt that his pain medications is ordered 'as needed' as he needs to ask for it, its not just given. Pt stated that was bull and he knows its due. RN gave pt his pain medication and repeated that he has to ask for it when needed.

## 2018-07-12 NOTE — Progress Notes (Signed)
Pt is cussing stating he is going to leave the floor to go find food. Adv pt if he leaves the floor he will have to sign AMA paper. Pt is mad and stating that he needs to eat. Offered snack but blood sugar is increased to 200's after patient refused insulin. Pt is mad and cussing at staff.

## 2018-07-12 NOTE — ED Notes (Signed)
Spoke with pt states he is willing to allow nurse to start IV and begin fluids. Pt made comment that he was going to leave here soon and go and lay in road again. Spoke with provider IVC paperwork to be initiated
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
## 2018-07-12 NOTE — Progress Notes (Signed)
Donnamarie Poag, NP paged to see if pts pain medication and Gabapentin could be ordered per pt request. Waiting for call back/orders. Will continue to monitor pt

## 2018-07-12 NOTE — Progress Notes (Signed)
Pt adv he is to get Dextrose 50% and Lantus insulin to discontinue the drip- pt is refusing both. Stating he just wants food. Adv pt we are finding him a tray to eat and he needs to take his medications. Pt states he refuses to take his medications because we are "killing him" and he isn't stupid.
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
## 2018-07-12 NOTE — Progress Notes (Signed)
This RN called TTS clinician Debbe Odea back regarding pts TTS consult. [employer] computer is in room and ready for consult.

## 2018-07-12 NOTE — Progress Notes (Signed)
Pts blood sugar 59- insulin gtt off per protocol. Dr. Robb Matar paged and made aware-also adv of anion gap and CO2 in normal range. Will see if insulin gtt can be turned off

## 2018-07-12 NOTE — Progress Notes (Signed)
Late entry- Pt offered Miralax as well as Senna d/t pt complaining of constipation in ED (stated no BM x4 days) Pt refused both medications. Pt was educated it would help his constipation and pt still refused.  Will continue to monitor pt.
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
## 2018-07-13 DIAGNOSIS — F339 Major depressive disorder, recurrent, unspecified: Secondary | ICD-10-CM

## 2018-07-13 LAB — CBC WITH DIFFERENTIAL/PLATELET
Abs Immature Granulocytes: 0.02 10*3/uL (ref 0.00–0.07)
Basophils Absolute: 0.1 10*3/uL (ref 0.0–0.1)
Basophils Relative: 1 %
Eosinophils Absolute: 0.2 10*3/uL (ref 0.0–0.5)
Eosinophils Relative: 3 %
HCT: 35.2 % — ABNORMAL LOW (ref 39.0–52.0)
Hemoglobin: 12.1 g/dL — ABNORMAL LOW (ref 13.0–17.0)
Immature Granulocytes: 0 %
Lymphocytes Relative: 40 %
Lymphs Abs: 2.4 10*3/uL (ref 0.7–4.0)
MCH: 32.4 pg (ref 26.0–34.0)
MCHC: 34.4 g/dL (ref 30.0–36.0)
MCV: 94.4 fL (ref 80.0–100.0)
Monocytes Absolute: 0.6 10*3/uL (ref 0.1–1.0)
Monocytes Relative: 9 %
Neutro Abs: 2.8 10*3/uL (ref 1.7–7.7)
Neutrophils Relative %: 47 %
Platelets: 177 10*3/uL (ref 150–400)
RBC: 3.73 MIL/uL — ABNORMAL LOW (ref 4.22–5.81)
RDW: 11.7 % (ref 11.5–15.5)
WBC: 6 10*3/uL (ref 4.0–10.5)
nRBC: 0 % (ref 0.0–0.2)

## 2018-07-13 LAB — BASIC METABOLIC PANEL
Anion gap: 8 (ref 5–15)
Anion gap: 7 (ref 5–15)
BUN: 13 mg/dL (ref 6–20)
BUN: 12 mg/dL (ref 6–20)
CO2: 18 mmol/L — ABNORMAL LOW (ref 22–32)
CO2: 19 mmol/L — ABNORMAL LOW (ref 22–32)
Calcium: 7.7 mg/dL — ABNORMAL LOW (ref 8.9–10.3)
Calcium: 7.8 mg/dL — ABNORMAL LOW (ref 8.9–10.3)
Chloride: 106 mmol/L (ref 98–111)
Chloride: 107 mmol/L (ref 98–111)
Creatinine, Ser: 0.7 mg/dL (ref 0.61–1.24)
Creatinine, Ser: 0.71 mg/dL (ref 0.61–1.24)
GFR calc Af Amer: >60 mL/min (ref >60)
GFR calc Af Amer: >60 mL/min (ref >60)
GFR calc non Af Amer: >60 mL/min (ref >60)
GFR calc non Af Amer: >60 mL/min (ref >60)
Glucose, Bld: 148 mg/dL — ABNORMAL HIGH (ref 70–99)
Glucose, Bld: 142 mg/dL — ABNORMAL HIGH (ref 70–99)
Potassium: 3.3 mmol/L — ABNORMAL LOW (ref 3.5–5.1)
Potassium: 3.1 mmol/L — ABNORMAL LOW (ref 3.5–5.1)
Sodium: 132 mmol/L — ABNORMAL LOW (ref 135–145)
Sodium: 133 mmol/L — ABNORMAL LOW (ref 135–145)

## 2018-07-13 LAB — GLUCOSE, CAPILLARY
Glucose-Capillary: 101 mg/dL — ABNORMAL HIGH (ref 70–99)
Glucose-Capillary: 109 mg/dL — ABNORMAL HIGH (ref 70–99)
Glucose-Capillary: 142 mg/dL — ABNORMAL HIGH (ref 70–99)
Glucose-Capillary: 191 mg/dL — ABNORMAL HIGH (ref 70–99)
Glucose-Capillary: 196 mg/dL — ABNORMAL HIGH (ref 70–99)
Glucose-Capillary: 474 mg/dL — ABNORMAL HIGH (ref 70–99)
Glucose-Capillary: 131 mg/dL — ABNORMAL HIGH (ref 70–99)
Glucose-Capillary: 160 mg/dL — ABNORMAL HIGH (ref 70–99)
Glucose-Capillary: 98 mg/dL (ref 70–99)
Glucose-Capillary: 162 mg/dL — ABNORMAL HIGH (ref 70–99)
Glucose-Capillary: 112 mg/dL — ABNORMAL HIGH (ref 70–99)
Glucose-Capillary: 179 mg/dL — ABNORMAL HIGH (ref 70–99)
Glucose-Capillary: 155 mg/dL — ABNORMAL HIGH (ref 70–99)
Glucose-Capillary: 128 mg/dL — ABNORMAL HIGH (ref 70–99)
Glucose-Capillary: 113 mg/dL — ABNORMAL HIGH (ref 70–99)

## 2018-07-13 LAB — HIV ANTIBODY (ROUTINE TESTING W REFLEX): HIV Screen 4th Generation wRfx: NONREACTIVE

## 2018-07-13 LAB — MAGNESIUM: Magnesium: 1.8 mg/dL (ref 1.7–2.4)

## 2018-07-13 MED ORDER — INSULIN GLARGINE 100 UNIT/ML ~~LOC~~ SOLN
15.0000 [IU] | SUBCUTANEOUS | Status: DC
  Administered 2018-07-13: 10:00:00 15 [IU] via SUBCUTANEOUS
  Filled 2018-07-13 (×2): qty 0.15

## 2018-07-13 MED ORDER — OXYCODONE HCL 5 MG PO TABS: 2.5000 mg | ORAL_TABLET | ORAL | Status: DC | PRN

## 2018-07-13 MED ORDER — OXYCODONE HCL 5 MG PO TABS
2.5000 mg | ORAL_TABLET | Freq: Four times a day (QID) | ORAL | Status: DC | PRN
  Administered 2018-07-13 – 2018-07-17 (×9): 2.5 mg via ORAL
  Filled 2018-07-13 (×11): qty 1

## 2018-07-13 MED ORDER — INSULIN ASPART 100 UNIT/ML ~~LOC~~ SOLN
0.0000 [IU] | Freq: Three times a day (TID) | SUBCUTANEOUS | Status: DC
  Administered 2018-07-13: 1 [IU] via SUBCUTANEOUS
  Administered 2018-07-13: 12:00:00 2 [IU] via SUBCUTANEOUS
  Administered 2018-07-14 (×2): 3 [IU] via SUBCUTANEOUS
  Administered 2018-07-15: 2 [IU] via SUBCUTANEOUS
  Administered 2018-07-15 (×2): 1 [IU] via SUBCUTANEOUS
  Administered 2018-07-16: 12:00:00 2 [IU] via SUBCUTANEOUS
  Administered 2018-07-16: 17:00:00 3 [IU] via SUBCUTANEOUS
  Administered 2018-07-16: 08:00:00 5 [IU] via SUBCUTANEOUS
  Administered 2018-07-17: 09:00:00 3 [IU] via SUBCUTANEOUS

## 2018-07-13 MED ORDER — MAGNESIUM SULFATE 2 GM/50ML IV SOLN
2.0000 g | Freq: Once | INTRAVENOUS | Status: AC
  Administered 2018-07-13: 2 g via INTRAVENOUS
  Filled 2018-07-13: qty 50

## 2018-07-13 MED ORDER — POTASSIUM CHLORIDE 10 MEQ/100ML IV SOLN
10.0000 meq | INTRAVENOUS | Status: AC
  Administered 2018-07-13 (×4): 10 meq via INTRAVENOUS
  Filled 2018-07-13 (×4): qty 100

## 2018-07-13 MED ORDER — TEMAZEPAM 7.5 MG PO CAPS
7.5000 mg | ORAL_CAPSULE | Freq: Every evening | ORAL | Status: DC | PRN
  Administered 2018-07-13: 21:00:00 7.5 mg via ORAL
  Filled 2018-07-13: qty 1

## 2018-07-13 MED ORDER — INSULIN ASPART 100 UNIT/ML ~~LOC~~ SOLN
5.0000 [IU] | Freq: Three times a day (TID) | SUBCUTANEOUS | Status: DC
  Administered 2018-07-13 (×2): 5 [IU] via SUBCUTANEOUS

## 2018-07-13 MED ORDER — SODIUM CHLORIDE 0.9 % IV SOLN
INTRAVENOUS | Status: DC
  Administered 2018-07-13 – 2018-07-14 (×2): via INTRAVENOUS

## 2018-07-13 MED ORDER — INSULIN ASPART 100 UNIT/ML ~~LOC~~ SOLN
7.0000 [IU] | Freq: Three times a day (TID) | SUBCUTANEOUS | Status: DC
  Administered 2018-07-13 – 2018-07-14 (×3): 7 [IU] via SUBCUTANEOUS
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
## 2018-07-13 NOTE — Progress Notes (Signed)
PROGRESS NOTE    Kenneth RainwaterRandy C Blades  RUE:[id number]  DOB: 12/09/1965  DOA: 07/12/2018 PCP: Oval Linseyondiego, [name], MD   Brief Admission Hx: 53 y.o. male with prior known mental illness, polysubstance abuser, homeless was discharged earlier today from [facility] due to his refusal to receive treatments apparently became more mentally disturbed later today and presented back to ED with severe agitation, suicidal ideation and was IVCd and was in DKA due to having not taken his insulin.   MDM/Assessment & Plan:   1. DKA - anion gap has closed and acidosis mostly resolved, patient's blood glucose is much better controlled and he is being transitioned to lantus, novolog plus supplemental sliding scale coverage.  Unfortunately patient has long history of poor compliance and refuses to follow diet and refuses to take insulin consistently.  2. Suicidal ideation - persistent, sitter at bedside, Pt has been IVC'd and when he is medically stable will order for a TTS consultation and pursue inpatient behavioral health treatment.  3. Dehydration - corrected with IV fluids.  4. Poorly controlled diabetes mellitus, type 1, with neurological complications - A1c>12%.  Disease much worse due to uncontrolled mental illness.  5. [facility]  6. Hypotension - His blood pressures normally run low and he is totally asymptomatic.  We have monitored him closely and he has remained stable.  7. GERD - protonix ordered for GI protection.  8. Tobacco abuse - nicotine patch ordered as needed.   9. Painful diabetic neuropathy - he takes gabapentin for this.  He reports that he takes opioids for this on occasion.    DVT prophylaxis: lovenix  Code Status: Full  Family Communication: patient/he refuses to give another contact Disposition Plan: inpatient behavioral health when medically stable    Consultants:    Procedures:    Antimicrobials:    Subjective: Pt reports that he wants more food to  eat, nurse says he already ate 2 trays this morning for breakfast.    Objective: Vitals:   07/13/18 0700 07/13/18 0702 07/13/18 0800 07/13/18 0900  BP: (!) 79/58 (!) 80/58 (!) 110/97 98/66  Pulse: (!) 58  62 68  Resp: 15 17 (!) 24 18  Temp:      TempSrc:      SpO2: 100%  100% 100%  Weight:      Height:        Intake/Output Summary (Last 24 hours) at 07/13/2018 1011 Last data filed at 07/13/2018 0856 Gross per 24 hour  Intake 3601.65 ml  Output 800 ml  Net 2801.65 ml   Filed Weights   07/12/18 1047 07/12/18 1700 07/13/18 0500  Weight: 54.4 kg 53.9 kg 56.3 kg     REVIEW OF SYSTEMS  As per history otherwise all reviewed and reported negative  Exam:  General exam: thin, emaciated male, NAD.   Respiratory system: Clear. No increased work of breathing. Cardiovascular system: S1 & S2 heard. No JVD, murmurs, gallops, clicks or pedal edema. Gastrointestinal system: Abdomen is nondistended, soft and nontender. Normal bowel sounds heard. Central nervous system: Alert and oriented. No focal neurological deficits. Extremities: no CCE.  Data Reviewed: Basic Metabolic Panel: Recent Labs  Lab 07/12/18 1509 07/12/18 1843 07/12/18 2224 07/13/18 0302 07/13/18 0658  NA 127* 131* 131* 132* 133*  K 4.9 3.9 3.6 3.3* 3.1*  CL 96* 103 106 106 107  CO2 12* 15* 18* 18* 19*  GLUCOSE 574* 293* 125* 148* 142*  BUN 19 17 15 13 12   CREATININE  1.19 1.08 0.66 0.70 0.71  CALCIUM 7.7* 8.0* 7.8* 7.7* 7.8*  MG  --   --   --  1.8  --    Liver Function Tests: Recent Labs  Lab 07/11/18 1515 07/12/18 1120  AST 22 19  ALT 21 18  ALKPHOS 67 60  BILITOT 2.2* 1.2  PROT 6.7 5.7*  ALBUMIN 4.0 3.5   Recent Labs  Lab 07/11/18 1515  LIPASE 22   No results for input(s): AMMONIA in the last 168 hours. CBC: Recent Labs  Lab 07/11/18 1515 07/12/18 1120 07/12/18 1509 07/13/18 0302  WBC 8.2 6.1 6.8 6.0  NEUTROABS 6.9 4.3  --  2.8  HGB 14.4 13.0 12.8* 12.1*  HCT 43.6 37.7* 38.1* 35.2*   MCV 98.4 94.5 97.2 94.4  PLT 231 189 163 177   Cardiac Enzymes: No results for input(s): CKTOTAL, CKMB, CKMBINDEX, TROPONINI in the last 168 hours. CBG (last 3)  Recent Labs    07/13/18 0700 07/13/18 0755 07/13/18 0854  GLUCAP 131* 98 162*   Recent Results (from the past 240 hour(s))  MRSA PCR Screening     Status: None   Collection Time: 07/11/18  7:59 PM  Result Value Ref Range Status   MRSA by PCR NEGATIVE NEGATIVE Final    Comment:        The GeneXpert MRSA Assay (FDA approved for NASAL specimens only), is one component of a comprehensive MRSA colonization surveillance program. It is not intended to diagnose MRSA infection nor to guide or monitor treatment for MRSA infections. Performed at [facility], [address], [place], [place] [address]   Culture, blood (routine x 2)     Status: None (Preliminary result)   Collection Time: 07/12/18  3:09 PM  Result Value Ref Range Status   Specimen Description RIGHT ANTECUBITAL  Final   Special Requests   Final    Blood Culture adequate volume BOTTLES DRAWN AEROBIC AND ANAEROBIC   Culture   Final    NO GROWTH < 24 HOURS Performed at [facility], [address] [place], [place] [address]    Report Status PENDING  Incomplete  Culture, blood (routine x 2)     Status: None (Preliminary result)   Collection Time: 07/12/18  3:45 PM  Result Value Ref Range Status   Specimen Description RIGHT ANTECUBITAL  Final   Special Requests   Final    BOTTLES DRAWN AEROBIC AND ANAEROBIC Blood Culture adequate volume   Culture   Final    NO GROWTH < 24 HOURS Performed at [facility], [address] [place], [place] [address]    Report Status PENDING  Incomplete     Studies: Ct Abdomen Pelvis W Contrast  Result Date: 07/11/2018 CLINICAL DATA:  Has had a bowel movement in 4 days.  Abdominal pain. EXAM: CT ABDOMEN AND PELVIS WITH CONTRAST TECHNIQUE: Multidetector CT imaging of the abdomen and pelvis was performed using the standard  protocol following bolus administration of intravenous contrast. CONTRAST:  OMNIPAQUE IOHEXOL 300 MG/ML  SOLN COMPARISON:  02/29/2016 FINDINGS: Lower chest: No acute abnormality. Hepatobiliary: No focal liver abnormality is seen. No gallstones, gallbladder wall thickening, or biliary dilatation. Pancreas: Unremarkable. No pancreatic ductal dilatation or surrounding inflammatory changes. Spleen: Normal in size without focal abnormality. Adrenals/Urinary Tract: Adrenal glands are unremarkable. Kidneys are normal, without renal calculi, focal lesion, or hydronephrosis. Bladder is unremarkable. Stomach/Bowel: Stomach is within normal limits. Appendix appears normal. No evidence of bowel wall thickening, distention, or inflammatory changes. Moderate amount  of stool in the ascending and transverse colon. Vascular/Lymphatic: Abdominal aortic atherosclerosis. No lymphadenopathy Reproductive: Prostate is unremarkable. Other: No abdominal wall hernia or abnormality. No abdominopelvic ascites. Musculoskeletal: No acute osseous abnormality. No aggressive osseous lesion. Mild degenerative disc disease with disc height loss at L5-S1. IMPRESSION: 1. No acute abdominal or pelvic pathology. 2. Moderate amount of stool in the ascending and transverse colon. Electronically Signed   By: Elige Ko   On: 07/11/2018 17:12   Scheduled Meds: . enoxaparin (LOVENOX) injection  40 mg Subcutaneous Q24H  . insulin aspart  0-9 Units Subcutaneous TID WC  . insulin aspart  5 Units Subcutaneous TID WC  . insulin glargine  15 Units Subcutaneous Q24H   Continuous Infusions: . dextrose 5 % and 0.45% NaCl 150 mL/hr at 07/13/18 0756  . famotidine (PEPCID) IV Stopped (07/12/18 1848)  . insulin 0.7 mL/hr at 07/13/18 0753  . magnesium sulfate 1 - 4 g bolus IVPB    . potassium chloride 10 mEq (07/13/18 0909)    Principal Problem:   DKA (diabetic ketoacidoses) (HCC) Active Problems:   Dehydration   Noncompliance with medication  regimen   Suicidal ideation   Depression, recurrent (HCC)   MDD (major depressive disorder), recurrent severe, without psychosis (HCC)   Cocaine abuse (HCC)   Tobacco abuse  Critical Care Time spent: 33 minutes   Standley Dakins, MD [facility] 07/13/2018, 10:11 AM    LOS: 1 day  How to contact the [facility] Attending or Consulting provider 7A - 7P or covering provider during after hours 7P -7A, for this patient?  1. Check the care team in [facility] and look for a) attending/consulting TRH provider listed and b) the [facility] team listed 2. Log into [email or web address] and use [place] universal password to access. If you do not have the password, please contact the hospital operator. 3. Locate the [facility] provider you are looking for under [facility] and page to a number that you can be directly reached. 4. If you still have difficulty reaching the provider, please page the [facility] (Director on Call) for the Hospitalists listed on amion for assistance.

## 2018-07-14 LAB — CBC WITH DIFFERENTIAL/PLATELET
Abs Immature Granulocytes: 0 10*3/uL (ref 0.00–0.07)
Basophils Absolute: 0 10*3/uL (ref 0.0–0.1)
Basophils Relative: 1 %
Eosinophils Absolute: 0.2 10*3/uL (ref 0.0–0.5)
Eosinophils Relative: 5 %
HCT: 34.1 % — ABNORMAL LOW (ref 39.0–52.0)
Hemoglobin: 11.9 g/dL — ABNORMAL LOW (ref 13.0–17.0)
Immature Granulocytes: 0 %
Lymphocytes Relative: 41 %
Lymphs Abs: 1.9 10*3/uL (ref 0.7–4.0)
MCH: 33.1 pg (ref 26.0–34.0)
MCHC: 34.9 g/dL (ref 30.0–36.0)
MCV: 94.7 fL (ref 80.0–100.0)
Monocytes Absolute: 0.4 10*3/uL (ref 0.1–1.0)
Monocytes Relative: 9 %
Neutro Abs: 2 10*3/uL (ref 1.7–7.7)
Neutrophils Relative %: 44 %
Platelets: 147 10*3/uL — ABNORMAL LOW (ref 150–400)
RBC: 3.6 MIL/uL — ABNORMAL LOW (ref 4.22–5.81)
RDW: 11.9 % (ref 11.5–15.5)
WBC: 4.6 10*3/uL (ref 4.0–10.5)
nRBC: 0 % (ref 0.0–0.2)

## 2018-07-14 LAB — GLUCOSE, CAPILLARY
Glucose-Capillary: 139 mg/dL — ABNORMAL HIGH (ref 70–99)
Glucose-Capillary: 246 mg/dL — ABNORMAL HIGH (ref 70–99)
Glucose-Capillary: 206 mg/dL — ABNORMAL HIGH (ref 70–99)
Glucose-Capillary: 114 mg/dL — ABNORMAL HIGH (ref 70–99)
Glucose-Capillary: 168 mg/dL — ABNORMAL HIGH (ref 70–99)

## 2018-07-14 LAB — BASIC METABOLIC PANEL
Anion gap: 6 (ref 5–15)
BUN: 13 mg/dL (ref 6–20)
CO2: 22 mmol/L (ref 22–32)
Calcium: 7.9 mg/dL — ABNORMAL LOW (ref 8.9–10.3)
Chloride: 107 mmol/L (ref 98–111)
Creatinine, Ser: 0.62 mg/dL (ref 0.61–1.24)
GFR calc Af Amer: >60 mL/min (ref >60)
GFR calc non Af Amer: >60 mL/min (ref >60)
Glucose, Bld: 249 mg/dL — ABNORMAL HIGH (ref 70–99)
Potassium: 3.7 mmol/L (ref 3.5–5.1)
Sodium: 135 mmol/L (ref 135–145)

## 2018-07-14 LAB — C-PEPTIDE: C-Peptide: <0.1 ng/mL — ABNORMAL LOW (ref 1.1–4.4)

## 2018-07-14 LAB — MAGNESIUM: Magnesium: 1.8 mg/dL (ref 1.7–2.4)

## 2018-07-14 MED ORDER — GLUCERNA SHAKE PO LIQD
237.0000 mL | Freq: Three times a day (TID) | ORAL | Status: DC
  Administered 2018-07-14 – 2018-07-17 (×8): 237 mL via ORAL

## 2018-07-14 MED ORDER — ADULT MULTIVITAMIN W/MINERALS CH
1.0000 | ORAL_TABLET | Freq: Every day | ORAL | Status: DC
  Administered 2018-07-14 – 2018-07-17 (×4): 1 via ORAL
  Filled 2018-07-14 (×4): qty 1

## 2018-07-14 MED ORDER — INSULIN GLARGINE 100 UNIT/ML ~~LOC~~ SOLN
18.0000 [IU] | SUBCUTANEOUS | Status: DC
  Administered 2018-07-15 – 2018-07-17 (×3): 18 [IU] via SUBCUTANEOUS
  Filled 2018-07-14 (×5): qty 0.18

## 2018-07-14 MED ORDER — INSULIN ASPART 100 UNIT/ML ~~LOC~~ SOLN
8.0000 [IU] | Freq: Three times a day (TID) | SUBCUTANEOUS | Status: DC
  Administered 2018-07-14 – 2018-07-17 (×8): 8 [IU] via SUBCUTANEOUS

## 2018-07-14 MED ORDER — INSULIN GLARGINE 100 UNIT/ML ~~LOC~~ SOLN
16.0000 [IU] | SUBCUTANEOUS | Status: DC
  Administered 2018-07-14: 10:00:00 16 [IU] via SUBCUTANEOUS
  Filled 2018-07-14 (×4): qty 0.16

## 2018-07-14 MED ORDER — OXYCODONE HCL 5 MG PO TABS
5.0000 mg | ORAL_TABLET | Freq: Once | ORAL | Status: AC
  Administered 2018-07-14: 5 mg via ORAL
  Filled 2018-07-14: qty 1

## 2018-07-14 MED ORDER — OXYCODONE HCL 5 MG PO TABS: 5.0000 mg | ORAL_TABLET | Freq: Once | ORAL | Status: DC | PRN

## 2018-07-14 NOTE — Progress Notes (Signed)
Inpatient Diabetes Program Recommendations  AACE/ADA: New Consensus Statement on Inpatient Glycemic Control (2015)  Target Ranges:  Prepandial:   less than 140 mg/dL      Peak postprandial:   less than 180 mg/dL (1-2 hours)      Critically ill patients:  140 - 180 mg/dL   Lab Results  Component Value Date   GLUCAP 206 (H) 07/14/2018   HGBA1C 12.7 (H) 06/06/2018    Review of Glycemic Control Results for Kenneth [name], Kenneth [name] (MRN [id number]) as of 07/14/2018 13:19  Ref. Range 07/14/2018 02:06 07/14/2018 07:40 07/14/2018 11:08  Glucose-Capillary Latest Ref Range: 70 - 99 mg/dL 427 (H) 062 (H) 376 (H)   Diabetes history: Type 2 DM Outpatient Diabetes medications: Lantus 16 units BID, Novolog 10 units TID Current orders for Inpatient glycemic control: Lantus 16 units QD, Novolog 7 units TID, Novolog 0-9 units TID  Inpatient Diabetes Program Recommendations:    Noted consult.   Spoke with patient regarding DM. This is patient's 4th admission for DKA in the last 3 months related to SI. Patient is homeless.  Reviewed patient's current A1c of 12.7%. Explained what a A1c is and what it measures. Also reviewed goal A1c with patient, importance of good glucose control @ home, and blood sugar goals. Reviewed DKA, need for insulin, role of pancreas, vascular changes and comorbidites. Patient has a meter and uses insulin pens. States, "I have plenty of supplies and things I need, I just got to use them like I should. One thing I could use is more Novolog." Patient admits to missing doses and not taking them as he should because of his mental health. Again, he states,"I know what to do, I just need a place to stay." Encouraged patient to follow up with the SW regarding outpatient shelters and food banks to help until his check comes in. Patient is resistant to live in a place with others. Encouraged goal setting to help build on where he is and encouraged him to utilize resources that were needed for him until he  was able to get back on his feet. Noted dietitian spoke with pt. Patient has no further questions related to DM at this time.   Thanks, Lujean Rave, MSN, RNC-OB Diabetes Coordinator [phone] (8a-5p)
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
## 2018-07-14 NOTE — Progress Notes (Signed)
Removed tick from rt underarm. No redness noted at site.

## 2018-07-14 NOTE — TOC Progression Note (Signed)
Transition of Care [facility]) - Progression Note    Patient Details  Name: Kenneth [name] MRN: [id number] Date of Birth: [birth year]  Transition of Care [facility] CM/SW [place], [place] Phone Number: 07/14/2018, 2:28 PM  Clinical Narrative:   Met with patient to update d/c plan following psych clear status by TTS.  Found [name] disappointed, but surprisingly willing to engage in conversation about plan.  He continues to state that "I am not walking the streets.  I am not able to cope right now, and I feel like a ticking time bomb."  Let him know that shelters are still the only option, but I would check on whether residents are still required to leave during the day.  Ms Adah Perl with [facility] in [place] let me know they are sheltering in place, but due to pt's requirement of monitoring CBG and insulin, as well as mental health diagnosis, he would not be an appropriate candidate.  Spoke with Will Bahoboy at [facility] in [place].  They are also sheltering in place, meaning residents must stay there during the day to reduce risk of spreak of COVID, and that insulin and mental health diagnosis is OK.  They do not have any openings today.  Returned to talk to patient about this. "I am not gonna turn anything down that will allow me to stay there around the clock."  He is an agreement to go when a bed becomes available.  Number for shelter is [phone].  Staff will need to call daily when pt is cleared for d/c to find out if a bed is available.  IF so, pt will need to go with 30 day scripts, 7 day supply of meds including insulin.  He states his meter is in his belongings.  He would also need needles for injection if he has none.  Pt states he has no transportation. Last time we cabbed someone there it was $85.00.  [employer] at [phone] is local company.   Expected Discharge Plan: (P) Home/Self Care Barriers to Discharge: (P) Homeless with medical needs  Expected  Discharge Plan and Services Expected Discharge Plan: (P) Home/Self Care In-house Referral: (P) Clinical Social Work Discharge Planning Services: (P) CM Consult   Living arrangements for the past 2 months: (P) No permanent address                                       Social Determinants of Health (SDOH) Interventions    Readmission Risk Interventions Readmission Risk Prevention Plan 07/13/2018 07/12/2018  Transportation Screening Complete Complete  PCP or Specialist Appt within 3-5 Days Not Complete Not Complete  Not Complete comments - pt argumentative and being escorted out by police  [place] or [place] Complete Complete  Social Work Consult for [place] Planning/Counseling Complete -  Palliative Care Screening Not Applicable Not Applicable  Medication Review Press photographer) Complete Complete  Some recent data might be hidden

## 2018-07-14 NOTE — Progress Notes (Signed)
PROGRESS NOTE  Kenneth [name]  WNU:[id number]  DOB: [birth year]  DOA: 07/12/2018 PCP: Oval Linsey, MD   Brief Admission Hx: 53 y/o severely mentally ill suicidal male with known polysubstance abuse, homelessness, severe agitation was IVC'd due to suicidal thoughts and plans and DKA due to refusal to take insulin admitted to ICU.   MDM/Assessment & Plan:   1. Diabetic ketoacidosis- resolved now, anion gap is closed and blood sugars much better controlled.  He is on Lantus and prandial NovoLog plus supplemental sliding scale coverage.  His hemoglobin A1c is 12.7% which indicates poorly controlled disease.  Now that his DKA has resolved and is diabetes is much better controlled he is medically stable to transfer to behavioral health for psychiatric treatment. 2. Suicidal ideation and plan-He continues to threaten to lie in street or run into traffic to attempt suicide.  I have asked for a TTS consultation and arrangements for inpatient behavioral health treatment.  3. Dehydration - Corrected with IV fluids.  4. 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  His DM is much better controlled when he is taking the insulin.  5. Hypotension - His blood pressure normally runs low normal and he is asymptomatic from this.  He is not on antihypertensives.  He has been monitored in ICU and has remained stable >48 hours.  6. GERD - IV pepcid ordered for GI protection.  7. Painful diabetic peripheral polyneuropathy - continue gabapentin, occasionally takes oxycodone for this but we have tried to avoid this with his history of polysubstance abuse.  8. Hypokalemia - this has been repleted.   DVT prophylaxis: lovenox Code Status: Full  Family Communication: patient updated bedside Disposition Plan: Pt is medically stable to transfer to inpatient behavioral health   Consultants:  TTS  Procedures:   n/a  Antimicrobials:  n/a   Subjective: Pt without specific complaints but asking for 2 breakfast trays.   Objective: Vitals:   07/14/18 0400 07/14/18 0500 07/14/18 0600 07/14/18 0740  BP: 98/73 99/68 91/73    Pulse: 67 64 64   Resp: 13 12 15    Temp: 98.1 F (36.7 C)   98.2 F (36.8 C)  TempSrc: Oral   Oral  SpO2: 95% 98% 97%   Weight:  57.1 kg    Height:        Intake/Output Summary (Last 24 hours) at 07/14/2018 0900 Last data filed at 07/14/2018 0700 Gross per 24 hour  Intake 2846.96 ml  Output 1825 ml  Net 1021.96 ml   Filed Weights   07/12/18 1700 07/13/18 0500 07/14/18 0500  Weight: 53.9 kg 56.3 kg 57.1 kg     REVIEW OF SYSTEMS  As per history otherwise all reviewed and reported negative  Exam:  General exam: thin, emaciated, poorly groomed male, speaking suicidal thoughts of harming himself and plans to run into traffic if discharged. Respiratory system: Clear. No increased work of breathing. Cardiovascular system: S1 & S2 heard. No JVD, murmurs, gallops, clicks or pedal edema. Gastrointestinal system: Abdomen is nondistended, soft and nontender. Normal bowel sounds heard. Central nervous system: Alert and oriented. No focal neurological deficits. Extremities: no CCE.  Data Reviewed: Basic Metabolic Panel: Recent Labs  Lab 07/12/18 1843 07/12/18 2224 07/13/18 0302 07/13/18 0658 07/14/18 0546  NA 131* 131* 132* 133* 135  K 3.9 3.6 3.3* 3.1* 3.7  CL 103 106 106 107 107  CO2 15* 18* 18* 19* 22  GLUCOSE  293* 125* 148* 142* 249*  BUN CREATININE 1.08 0.66 0.70 0.71 0.62  CALCIUM 8.0* 7.8* 7.7* 7.8* 7.9*  MG  --   --  1.8  --  1.8   Liver Function Tests: Recent Labs  Lab 07/11/18 1515 07/12/18 1120  AST 22 19  ALT 21 18  ALKPHOS 67 60  BILITOT 2.2* 1.2  PROT 6.7 5.7*  ALBUMIN 4.0 3.5   Recent Labs  Lab 07/11/18 1515  LIPASE 22   No results for input(s): AMMONIA in the last 168 hours. CBC: Recent Labs  Lab 07/11/18  1515 07/12/18 1120 07/12/18 1509 07/13/18 0302 07/14/18 0546  WBC 8.2 6.1 6.8 6.0 4.6  NEUTROABS 6.9 4.3  --  2.8 2.0  HGB 14.4 13.0 12.8* 12.1* 11.9*  HCT 43.6 37.7* 38.1* 35.2* 34.1*  MCV 98.4 94.5 97.2 94.4 94.7  PLT 231 189 163 177 147*   Cardiac Enzymes: No results for input(s): CKTOTAL, CKMB, CKMBINDEX, TROPONINI in the last 168 hours. CBG (last 3)  Recent Labs    07/13/18 2127 07/14/18 0206 07/14/18 0740  GLUCAP 113* 139* 246*   Recent Results (from the past 240 hour(s))  MRSA PCR Screening     Status: None   Collection Time: 07/11/18  7:59 PM  Result Value Ref Range Status   MRSA by PCR NEGATIVE NEGATIVE Final    Comment:        The GeneXpert MRSA Assay (FDA approved for NASAL specimens only), is one component of a comprehensive MRSA colonization surveillance program. It is not intended to diagnose MRSA infection nor to guide or monitor treatment for MRSA infections. Performed at [facility], [address], [place], [place] [address]   Culture, blood (routine x 2)     Status: None (Preliminary result)   Collection Time: 07/12/18  3:09 PM  Result Value Ref Range Status   Specimen Description RIGHT ANTECUBITAL  Final   Special Requests   Final    Blood Culture adequate volume BOTTLES DRAWN AEROBIC AND ANAEROBIC   Culture   Final    NO GROWTH 2 DAYS Performed at [facility], [address], [place], [place] [address]    Report Status PENDING  Incomplete  Culture, blood (routine x 2)     Status: None (Preliminary result)   Collection Time: 07/12/18  3:45 PM  Result Value Ref Range Status   Specimen Description RIGHT ANTECUBITAL  Final   Special Requests   Final    BOTTLES DRAWN AEROBIC AND ANAEROBIC Blood Culture adequate volume   Culture   Final    NO GROWTH 2 DAYS Performed at [facility], [address], [place], [place] [address]    Report Status PENDING  Incomplete     Studies: No results found.   Scheduled Meds: . enoxaparin (LOVENOX)  injection  40 mg Subcutaneous Q24H  . insulin aspart  0-9 Units Subcutaneous TID WC  . insulin aspart  7 Units Subcutaneous TID WC  . insulin glargine  16 Units Subcutaneous Q24H   Continuous Infusions: . sodium chloride 100 mL/hr at 07/14/18 0602  . famotidine (PEPCID) IV Stopped (07/13/18 2144)    Principal Problem:   DKA (diabetic ketoacidoses) (HCC) Active Problems:   Dehydration   Noncompliance with medication regimen   Suicidal ideation   Depression, recurrent (HCC)   MDD (major depressive disorder), recurrent severe, without psychosis (HCC)   Cocaine abuse (HCC)   Tobacco abuse  Time spent:   [employer],  MD [facility] 07/14/2018, 9:00 AM    LOS: 2 days  How to contact the [facility] Attending or Consulting provider 7A - 7P or covering provider during after hours 7P -7A, for this patient?  1. Check the care team in [facility] and look for a) attending/consulting TRH provider listed and b) the [facility] team listed 2. Log into [email or web address] and use [place]'s universal password to access. If you do not have the password, please contact the hospital operator. 3. Locate the [facility] provider you are looking for under [facility] and page to a number that you can be directly reached. 4. If you still have difficulty reaching the provider, please page the [facility] (Director on Call) for the Hospitalists listed on amion for assistance.
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
## 2018-07-15 LAB — CBC WITH DIFFERENTIAL/PLATELET
Abs Immature Granulocytes: 0.01 10*3/uL (ref 0.00–0.07)
Basophils Absolute: 0 10*3/uL (ref 0.0–0.1)
Basophils Relative: 1 %
Eosinophils Absolute: 0.3 10*3/uL (ref 0.0–0.5)
Eosinophils Relative: 6 %
HCT: 33.9 % — ABNORMAL LOW (ref 39.0–52.0)
Hemoglobin: 11.6 g/dL — ABNORMAL LOW (ref 13.0–17.0)
Immature Granulocytes: 0 %
Lymphocytes Relative: 39 %
Lymphs Abs: 1.6 10*3/uL (ref 0.7–4.0)
MCH: 32.4 pg (ref 26.0–34.0)
MCHC: 34.2 g/dL (ref 30.0–36.0)
MCV: 94.7 fL (ref 80.0–100.0)
Monocytes Absolute: 0.5 10*3/uL (ref 0.1–1.0)
Monocytes Relative: 11 %
Neutro Abs: 1.8 10*3/uL (ref 1.7–7.7)
Neutrophils Relative %: 43 %
Platelets: 133 10*3/uL — ABNORMAL LOW (ref 150–400)
RBC: 3.58 MIL/uL — ABNORMAL LOW (ref 4.22–5.81)
RDW: 11.9 % (ref 11.5–15.5)
WBC: 4.1 10*3/uL (ref 4.0–10.5)
nRBC: 0 % (ref 0.0–0.2)

## 2018-07-15 LAB — GLUCOSE, CAPILLARY
Glucose-Capillary: 189 mg/dL — ABNORMAL HIGH (ref 70–99)
Glucose-Capillary: 135 mg/dL — ABNORMAL HIGH (ref 70–99)
Glucose-Capillary: 187 mg/dL — ABNORMAL HIGH (ref 70–99)
Glucose-Capillary: 141 mg/dL — ABNORMAL HIGH (ref 70–99)
Glucose-Capillary: 184 mg/dL — ABNORMAL HIGH (ref 70–99)

## 2018-07-15 LAB — MAGNESIUM: Magnesium: 1.8 mg/dL (ref 1.7–2.4)

## 2018-07-15 MED ORDER — POLYETHYLENE GLYCOL 3350 17 G PO PACK
17.0000 g | PACK | Freq: Every day | ORAL | Status: DC
  Administered 2018-07-15 – 2018-07-17 (×3): 17 g via ORAL
  Filled 2018-07-15 (×3): qty 1

## 2018-07-15 MED ORDER — GABAPENTIN 300 MG PO CAPS
300.0000 mg | ORAL_CAPSULE | Freq: Three times a day (TID) | ORAL | Status: DC
  Administered 2018-07-15 – 2018-07-17 (×5): 300 mg via ORAL
  Filled 2018-07-15 (×6): qty 1

## 2018-07-15 MED ORDER — FAMOTIDINE 20 MG PO TABS
20.0000 mg | ORAL_TABLET | Freq: Two times a day (BID) | ORAL | Status: DC
  Administered 2018-07-15 – 2018-07-17 (×4): 20 mg via ORAL
  Filled 2018-07-15 (×5): qty 1

## 2018-07-15 NOTE — TOC Transition Note (Signed)
Transition of Care [facility]) - CM/SW Discharge Note   Patient Details  Name: Kenneth [name] MRN: [id number] Date of Birth: [birth year]  Transition of Care [facility]) CM/SW Contact:  Allayne Butcher, RN Phone Number: 07/15/2018, 3:28 PM   Clinical Narrative:     Patient has been cleared for discharge by medical team.  Patient has Medicaid he can go and get his prescriptions filled for little to no cost.  [facility] does not supply medications so unable to give patient 7 day supply, he needs to take his prescriptions to a pharmacy.  LCSW attempted to call shelter with no answer.  There is no reason to keep patient in the hospital so he can be discharged.    Final next level of care: Other (comment)(Homeless) Barriers to Discharge: No Barriers Identified   Patient Goals and CMS Choice Patient states their goals for this hospitalization and ongoing recovery are:: (P) "I need some extra help for a week or two.  I'll be fine then."      Discharge Placement                       Discharge Plan and Services In-house Referral: (P) Clinical Social Work Discharge Planning Services: (P) CM Consult                                 Social Determinants of Health (SDOH) Interventions     Readmission Risk Interventions Readmission Risk Prevention Plan 07/13/2018 07/12/2018  Transportation Screening Complete Complete  PCP or Specialist Appt within 3-5 Days Not Complete Not Complete  Not Complete comments - pt argumentative and being escorted out by police  HRI or Home Care Consult Complete Complete  Social Work Consult for Recovery Care Planning/Counseling Complete -  Palliative Care Screening Not Applicable Not Applicable  Medication Review Oceanographer) Complete Complete  Some recent data might be hidden

## 2018-07-15 NOTE — Progress Notes (Signed)
PROGRESS NOTE  Kenneth RainwaterRandy C [name]  EXB:[id number]  DOB: 10/15/1965  DOA: 07/12/2018 PCP: Oval Linseyondiego, [name], MD   Brief Admission Hx: 53 y/o severely mentally ill suicidal male with known polysubstance abuse, homelessness, severe agitation was IVC'd due to suicidal thoughts and plans and DKA due to refusal to take insulin admitted to ICU.   07/15/2018: DKA has resolved.  Patient is awaiting disposition.  Patient seems to be homeless.  Social work team's input is highly appreciated.  Pursue disposition if okay with psychiatry, and if an ideal and safe disposition is feasible.  MDM/Assessment & Plan:   Diabetic ketoacidosis: Resolved.  Continue to optimize blood sugar control.   Continue on long-acting insulin and SSI coverage.   Continue diabetic teaching   Suicidal ideation: See psych note.  Cleared by psychiatry as per collateral information  Dehydration - Corrected with IV fluids.   Hypotension: Resolved. Blood pressure normally runs low normal    Diabetic neuropathy:  Continue gabapentin.      DVT prophylaxis: lovenox Code Status: Full  Family Communication:  Disposition Plan: Pt is medically stable.   Consultants:  TTS  Procedures:  n/a  Antimicrobials:  n/a   Subjective: Reports constipation.  Otherwise, no other new complaints.  Objective: Vitals:   07/14/18 1700 07/14/18 1948 07/14/18 2200 07/15/18 0605  BP:  (!) 127/92 105/76 101/73  Pulse: 78 81 71 72  Resp: 19 16 16 16   Temp:  98.6 F (37 C) 98.6 F (37 C) 97.8 F (36.6 C)  TempSrc:  Oral Oral Oral  SpO2: 98% 100% 100% 99%  Weight:      Height:        Intake/Output Summary (Last 24 hours) at 07/15/2018 1430 Last data filed at 07/15/2018 0900 Gross per 24 hour  Intake 1312.5 ml  Output -  Net 1312.5 ml   Filed Weights   07/12/18 1700 07/13/18 0500 07/14/18 0500  Weight: 53.9 kg 56.3 kg 57.1 kg     REVIEW OF SYSTEMS  As per history otherwise all reviewed and reported negative   Exam:  General exam: thin.  Not in any obvious distress. Respiratory system: Clear. No increased work of breathing. Cardiovascular system: S1 & S2 heard. No JVD, murmurs, gallops, clicks or pedal edema. Gastrointestinal system: Abdomen is nondistended, soft and nontender. Normal bowel sounds heard. Central nervous system: Alert and oriented.  Patient moves all extremities.   Extremities: No leg edema.    Data Reviewed: Basic Metabolic Panel: Recent Labs  Lab 07/12/18 1843 07/12/18 2224 07/13/18 0302 07/13/18 0658 07/14/18 0546 07/15/18 0649  NA 131* 131* 132* 133* 135  --   K 3.9 3.6 3.3* 3.1* 3.7  --   CL 103 106 106 107 107  --   CO2 15* 18* 18* 19* 22  --   GLUCOSE 293* 125* 148* 142* 249*  --   BUN 17 15 13 12 13   --   CREATININE 1.08 0.66 0.70 0.71 0.62  --   CALCIUM 8.0* 7.8* 7.7* 7.8* 7.9*  --   MG  --   --  1.8  --  1.8 1.8   Liver Function Tests: Recent Labs  Lab 07/11/18 1515 07/12/18 1120  AST 22 19  ALT 21 18  ALKPHOS 67 60  BILITOT 2.2* 1.2  PROT 6.7 5.7*  ALBUMIN 4.0 3.5   Recent Labs  Lab 07/11/18 1515  LIPASE 22   No results for input(s): AMMONIA in the last 168 hours. CBC: Recent Labs  Lab 07/11/18  1515 07/12/18 1120 07/12/18 1509 07/13/18 0302 07/14/18 0546 07/15/18 0649  WBC 8.2 6.1 6.8 6.0 4.6 4.1  NEUTROABS 6.9 4.3  --  2.8 2.0 1.8  HGB 14.4 13.0 12.8* 12.1* 11.9* 11.6*  HCT 43.6 37.7* 38.1* 35.2* 34.1* 33.9*  MCV 98.4 94.5 97.2 94.4 94.7 94.7  PLT 231 189 163 177 147* 133*   Cardiac Enzymes: No results for input(s): CKTOTAL, CKMB, CKMBINDEX, TROPONINI in the last 168 hours. CBG (last 3)  Recent Labs    07/15/18 0306 07/15/18 0735 07/15/18 1126  GLUCAP 189* 135* 187*   Recent Results (from the past 240 hour(s))  MRSA PCR Screening     Status: None   Collection Time: 07/11/18  7:59 PM  Result Value Ref Range Status   MRSA by PCR NEGATIVE NEGATIVE Final    Comment:        The GeneXpert MRSA Assay (FDA approved for  NASAL specimens only), is one component of a comprehensive MRSA colonization surveillance program. It is not intended to diagnose MRSA infection nor to guide or monitor treatment for MRSA infections. Performed at [facility], [address], [place], [place] [address]   Culture, blood (routine x 2)     Status: None (Preliminary result)   Collection Time: 07/12/18  3:09 PM  Result Value Ref Range Status   Specimen Description RIGHT ANTECUBITAL  Final   Special Requests   Final    Blood Culture adequate volume BOTTLES DRAWN AEROBIC AND ANAEROBIC   Culture   Final    NO GROWTH 3 DAYS Performed at [facility], [address], [place], [place] [address]    Report Status PENDING  Incomplete  Culture, blood (routine x 2)     Status: None (Preliminary result)   Collection Time: 07/12/18  3:45 PM  Result Value Ref Range Status   Specimen Description RIGHT ANTECUBITAL  Final   Special Requests   Final    BOTTLES DRAWN AEROBIC AND ANAEROBIC Blood Culture adequate volume   Culture   Final    NO GROWTH 3 DAYS Performed at [facility], [address], [place], [place] [address]    Report Status PENDING  Incomplete     Studies: No results found.   Scheduled Meds: . enoxaparin (LOVENOX) injection  40 mg Subcutaneous Q24H  . famotidine  20 mg Oral BID  . feeding supplement (GLUCERNA SHAKE)  237 mL Oral TID BM  . insulin aspart  0-9 Units Subcutaneous TID WC  . insulin aspart  8 Units Subcutaneous TID WC  . insulin glargine  18 Units Subcutaneous Q24H  . multivitamin with minerals  1 tablet Oral Daily   Continuous Infusions: . sodium chloride 75 mL/hr at 07/14/18 1142    Principal Problem:   DKA (diabetic ketoacidoses) (HCC) Active Problems:   Dehydration   Noncompliance with medication regimen   Suicidal ideation   Depression, recurrent (HCC)   MDD (major depressive disorder), recurrent severe, without psychosis (HCC)   Cocaine abuse (HCC)   Tobacco abuse  Time spent:    Barnetta Chapel, MD [facility] 07/15/2018, 2:30 PM    LOS: 3 days  How to contact the [facility] Attending or Consulting provider 7A - 7P or covering provider during after hours 7P -7A, for this patient?  1. Check the care team in [facility] and look for a) attending/consulting TRH provider listed and b) the [facility] team listed 2. Log into [email or web address] and use [place]'s universal password to access. If you do not  have the password, please contact the hospital operator. 3. Locate the [facility] provider you are looking for under [facility] and page to a number that you can be directly reached. 4. If you still have difficulty reaching the provider, please page the [facility] (Director on Call) for the Hospitalists listed on amion for assistance.

## 2018-07-15 NOTE — Progress Notes (Signed)
PHARMACIST - PHYSICIAN COMMUNICATION  DR:   Dartha Lodge CONCERNING: IV- to- Oral Route Change Policy  RECOMMENDATION: This patient is receiving famotidine by the intravenous route.  Based on criteria approved by the Pharmacy and Therapeutics Committee, the intravenous medication(s) is/are being converted to the equivalent oral dose form(s).   DESCRIPTION: These criteria include:  The patient is eating (either orally or via tube) and/or has been taking other orally administered medications for a least 24 hours  The patient has no evidence of active gastrointestinal bleeding or impaired GI absorption (gastrectomy, short bowel, patient on TNA or NPO).  If you have questions about this conversion, please contact the Pharmacy Department  [x]   [phone] )  [facility] []   [phone] )  [facility] []   [phone] )  [facility] []   [phone] )  [facility] []   [phone] )  [facility]   [place], [facility] 07/15/2018 12:15 PM

## 2018-07-16 LAB — GLUCOSE, CAPILLARY
Glucose-Capillary: 236 mg/dL — ABNORMAL HIGH (ref 70–99)
Glucose-Capillary: 254 mg/dL — ABNORMAL HIGH (ref 70–99)
Glucose-Capillary: 154 mg/dL — ABNORMAL HIGH (ref 70–99)
Glucose-Capillary: 198 mg/dL — ABNORMAL HIGH (ref 70–99)
Glucose-Capillary: 210 mg/dL — ABNORMAL HIGH (ref 70–99)
Glucose-Capillary: 176 mg/dL — ABNORMAL HIGH (ref 70–99)

## 2018-07-16 NOTE — Progress Notes (Signed)
PROGRESS NOTE  Kenneth [name]  KGU:[id number]  DOB: 1965/04/24  DOA: 07/12/2018 PCP: Oval Linsey, MD   Brief Admission Hx: 53 y/o severely mentally ill suicidal male with known polysubstance abuse, homelessness, severe agitation was IVC'd due to suicidal thoughts and plans and DKA due to refusal to take insulin admitted to ICU.   07/16/2018: DKA has resolved.  Patient is awaiting disposition.  Patient seems to be homeless.  Social work team's input is highly appreciated.  Pursue disposition if okay with psychiatry, and if an ideal and safe disposition is feasible.  Social work team is looking into possibility of shelter.  MDM/Assessment & Plan:   Diabetic ketoacidosis: Resolved.  Continue to optimize blood sugar control.   Continue on long-acting insulin and SSI coverage.   Continue diabetic teaching   Suicidal ideation: See psych note.  Cleared by psychiatry as per collateral information  Dehydration - Corrected with IV fluids.   Hypotension: Resolved. Blood pressure normally runs low normal    Diabetic neuropathy:  Continue gabapentin.      DVT prophylaxis: lovenox Code Status: Full  Family Communication:  Disposition Plan: Pt is medically stable.   Consultants:  TTS  Procedures:  n/a  Antimicrobials:  n/a   Subjective: No new complaints   Objective: Vitals:   07/15/18 0605 07/15/18 1500 07/15/18 2128 07/16/18 0538  BP: 101/73 131/83 108/69 106/80  Pulse: 72 87 80 71  Resp: 16 18 16 16   Temp: 97.8 F (36.6 C) 98.5 F (36.9 C) 98.7 F (37.1 C) 98.3 F (36.8 C)  TempSrc: Oral Oral Oral Oral  SpO2: 99% 100% 99% 99%  Weight:      Height:        Intake/Output Summary (Last 24 hours) at 07/16/2018 1205 Last data filed at 07/16/2018 0900 Gross per 24 hour  Intake 1320 ml  Output -  Net 1320 ml   Filed Weights   07/12/18 1700 07/13/18 0500 07/14/18 0500  Weight: 53.9 kg 56.3 kg 57.1 kg     REVIEW OF SYSTEMS  As per history otherwise  all reviewed and reported negative  Exam:  General exam: thin.  Not in any obvious distress. Respiratory system: Clear. No increased work of breathing. Cardiovascular system: S1 & S2 heard. No JVD, murmurs, gallops, clicks or pedal edema. Gastrointestinal system: Abdomen is nondistended, soft and nontender. Normal bowel sounds heard. Central nervous system: Alert and oriented.  Patient moves all extremities.   Extremities: No leg edema.    Data Reviewed: Basic Metabolic Panel: Recent Labs  Lab 07/12/18 1843 07/12/18 2224 07/13/18 0302 07/13/18 0658 07/14/18 0546 07/15/18 0649  NA 131* 131* 132* 133* 135  --   K 3.9 3.6 3.3* 3.1* 3.7  --   CL 103 106 106 107 107  --   CO2 15* 18* 18* 19* 22  --   GLUCOSE 293* 125* 148* 142* 249*  --   BUN 17 15 13 12 13   --   CREATININE 1.08 0.66 0.70 0.71 0.62  --   CALCIUM 8.0* 7.8* 7.7* 7.8* 7.9*  --   MG  --   --  1.8  --  1.8 1.8   Liver Function Tests: Recent Labs  Lab 07/11/18 1515 07/12/18 1120  AST 22 19  ALT 21 18  ALKPHOS 67 60  BILITOT 2.2* 1.2  PROT 6.7 5.7*  ALBUMIN 4.0 3.5   Recent Labs  Lab 07/11/18 1515  LIPASE 22   No results for input(s): AMMONIA in the last  168 hours. CBC: Recent Labs  Lab 07/11/18 1515 07/12/18 1120 07/12/18 1509 07/13/18 0302 07/14/18 0546 07/15/18 0649  WBC 8.2 6.1 6.8 6.0 4.6 4.1  NEUTROABS 6.9 4.3  --  2.8 2.0 1.8  HGB 14.4 13.0 12.8* 12.1* 11.9* 11.6*  HCT 43.6 37.7* 38.1* 35.2* 34.1* 33.9*  MCV 98.4 94.5 97.2 94.4 94.7 94.7  PLT 231 189 163 177 147* 133*   Cardiac Enzymes: No results for input(s): CKTOTAL, CKMB, CKMBINDEX, TROPONINI in the last 168 hours. CBG (last 3)  Recent Labs    07/16/18 0323 07/16/18 0731 07/16/18 1121  GLUCAP 236* 254* 154*   Recent Results (from the past 240 hour(s))  MRSA PCR Screening     Status: None   Collection Time: 07/11/18  7:59 PM  Result Value Ref Range Status   MRSA by PCR NEGATIVE NEGATIVE Final    Comment:        The  GeneXpert MRSA Assay (FDA approved for NASAL specimens only), is one component of a comprehensive MRSA colonization surveillance program. It is not intended to diagnose MRSA infection nor to guide or monitor treatment for MRSA infections. Performed at [facility], [address] [place], [place] [address]   Culture, blood (routine x 2)     Status: None (Preliminary result)   Collection Time: 07/12/18  3:09 PM  Result Value Ref Range Status   Specimen Description RIGHT ANTECUBITAL  Final   Special Requests   Final    Blood Culture adequate volume BOTTLES DRAWN AEROBIC AND ANAEROBIC   Culture   Final    NO GROWTH 4 DAYS Performed at [facility], [address] [place], [place] [address]    Report Status PENDING  Incomplete  Culture, blood (routine x 2)     Status: None (Preliminary result)   Collection Time: 07/12/18  3:45 PM  Result Value Ref Range Status   Specimen Description RIGHT ANTECUBITAL  Final   Special Requests   Final    BOTTLES DRAWN AEROBIC AND ANAEROBIC Blood Culture adequate volume   Culture   Final    NO GROWTH 4 DAYS Performed at [facility], [address] [place], [place] [address]    Report Status PENDING  Incomplete     Studies: No results found.   Scheduled Meds: . enoxaparin (LOVENOX) injection  40 mg Subcutaneous Q24H  . famotidine  20 mg Oral BID  . feeding supplement (GLUCERNA SHAKE)  237 mL Oral TID BM  . gabapentin  300 mg Oral TID  . insulin aspart  0-9 Units Subcutaneous TID WC  . insulin aspart  8 Units Subcutaneous TID WC  . insulin glargine  18 Units Subcutaneous Q24H  . multivitamin with minerals  1 tablet Oral Daily  . polyethylene glycol  17 g Oral Daily   Continuous Infusions:   Principal Problem:   DKA (diabetic ketoacidoses) (HCC) Active Problems:   Dehydration   Noncompliance with medication regimen   Suicidal ideation   Depression, recurrent (HCC)   MDD (major depressive disorder), recurrent severe, without  psychosis (HCC)   Cocaine abuse (HCC)   Tobacco abuse  Time spent:   Barnetta ChapelSylvester I Jazel Nimmons, MD [facility] 07/16/2018, 12:05 PM    LOS: 4 days  How to contact the [facility] Attending or Consulting provider 7A - 7P or covering provider during after hours 7P -7A, for this patient?  1. Check the care team in [facility] and look for a) attending/consulting TRH provider listed and b) the [facility] team listed  2. Log into [email or web address] and use [place]'s universal password to access. If you do not have the password, please contact the hospital operator. 3. Locate the [facility] provider you are looking for under [facility] and page to a number that you can be directly reached. 4. If you still have difficulty reaching the provider, please page the [facility] (Director on Call) for the Hospitalists listed on amion for assistance.

## 2018-07-16 NOTE — Plan of Care (Signed)
Patient is progressing with care plan. Provided diabetes education. Patient needs reinforcement with regard to a diabetic diet.

## 2018-07-17 DIAGNOSIS — E1010 Type 1 diabetes mellitus with ketoacidosis without coma: Principal | ICD-10-CM

## 2018-07-17 LAB — BASIC METABOLIC PANEL
Anion gap: 9 (ref 5–15)
BUN: 20 mg/dL (ref 6–20)
CO2: 26 mmol/L (ref 22–32)
Calcium: 8.7 mg/dL — ABNORMAL LOW (ref 8.9–10.3)
Chloride: 100 mmol/L (ref 98–111)
Creatinine, Ser: 0.6 mg/dL — ABNORMAL LOW (ref 0.61–1.24)
GFR calc Af Amer: >60 mL/min (ref >60)
GFR calc non Af Amer: >60 mL/min (ref >60)
Glucose, Bld: 207 mg/dL — ABNORMAL HIGH (ref 70–99)
Potassium: 4.2 mmol/L (ref 3.5–5.1)
Sodium: 135 mmol/L (ref 135–145)

## 2018-07-17 LAB — GLUCOSE, CAPILLARY
Glucose-Capillary: 234 mg/dL — ABNORMAL HIGH (ref 70–99)
Glucose-Capillary: 218 mg/dL — ABNORMAL HIGH (ref 70–99)
Glucose-Capillary: 209 mg/dL — ABNORMAL HIGH (ref 70–99)

## 2018-07-17 LAB — CULTURE, BLOOD (ROUTINE X 2)
Culture: NO GROWTH
Culture: NO GROWTH
Special Requests: ADEQUATE
Special Requests: ADEQUATE

## 2018-07-17 LAB — MAGNESIUM: Magnesium: 1.9 mg/dL (ref 1.7–2.4)

## 2018-07-17 MED ORDER — INSULIN ASPART 100 UNIT/ML ~~LOC~~ SOLN
10.0000 [IU] | Freq: Three times a day (TID) | SUBCUTANEOUS | Status: DC
  Administered 2018-07-17: 12:00:00 10 [IU] via SUBCUTANEOUS
  Filled 2018-07-17: qty 0.1

## 2018-07-17 MED ORDER — INSULIN ASPART 100 UNIT/ML ~~LOC~~ SOLN: 0.0000 [IU] | Freq: Every day | SUBCUTANEOUS | Status: DC

## 2018-07-17 MED ORDER — FAMOTIDINE 20 MG PO TABS: 20.0000 mg | ORAL_TABLET | Freq: Two times a day (BID) | ORAL | 0 refills | Status: DC

## 2018-07-17 MED ORDER — GABAPENTIN 300 MG PO CAPS: 300.0000 mg | ORAL_CAPSULE | Freq: Four times a day (QID) | ORAL | 0 refills | Status: DC

## 2018-07-17 MED ORDER — INSULIN ASPART 100 UNIT/ML ~~LOC~~ SOLN: 10.0000 [IU] | Freq: Three times a day (TID) | SUBCUTANEOUS | 0 refills | Status: DC

## 2018-07-17 MED ORDER — INSULIN GLARGINE 100 UNIT/ML ~~LOC~~ SOLN: 16.0000 [IU] | Freq: Two times a day (BID) | SUBCUTANEOUS | 0 refills | Status: DC

## 2018-07-17 MED ORDER — INSULIN ASPART 100 UNIT/ML ~~LOC~~ SOLN
0.0000 [IU] | Freq: Three times a day (TID) | SUBCUTANEOUS | Status: DC
  Administered 2018-07-17: 5 [IU] via SUBCUTANEOUS

## 2018-07-17 MED ORDER — INSULIN GLARGINE 100 UNIT/ML ~~LOC~~ SOLN
23.0000 [IU] | SUBCUTANEOUS | Status: DC
  Filled 2018-07-17: qty 0.23

## 2018-07-17 NOTE — Plan of Care (Signed)
Patient ready for discharge pending placement and case management.

## 2018-07-17 NOTE — Discharge Summary (Signed)
°Physician Discharge Summary  °Kenneth [name] MRN:[id number] DOB: 01/29/1966 DOA: 07/12/2018 ° °PCP: [name], [name], MD ° °Admit date: 07/12/2018 °Discharge date: 07/17/2018 ° °Admitted From: Home °Disposition:  Home ° °Recommendations for Outpatient Follow-up:  °1. Follow up with PCP in 1-2 weeks °2. Please obtain BMP/CBC in one week ° ° ° ° °Discharge Condition: Stable °CODE STATUS: FULL °Diet recommendation: Heart Healthy / Carb Modified  ° ° °Brief/Interim Summary: °53 y.o. male with prior known mental illness, polysubstance abuser, homeless was discharged earlier 07/12/18 from [facility] due to his refusal to receive treatments apparently became more mentally disturbed later today and presented back to ED with severe agitation, suicidal ideation and was IVCd.  Unfortunately he had refused to take his insulin and now is back in DKA after just having recently recovered.   He was positive for ketones, he was acidotic with pH of 7.240, he had an anion gap of 16 and CO2 of 18.  He was IVC'd for suicidal ideation.  He was started on IV insulin infusion after initially refusing for IV placement.  He will be admitted for medical stabilization and clearance and then subsequent intensive inpatient psychiatric treatments. He was subsequently seen by telepsychiatry and cleared.  He agreed to medical treatment and was subsequently weaned weaned off of IVF and transitioned to [place] insulin.  He was able to tolerate his diet and his DM regimen was optimized.   ° ° °Discharge Diagnoses:  °DKA °-patient started on IV insulin with q 1 hour CBG check and q 4 hour BMPs °-pt started on aggressive fluid resuscitation °-Electrolytes were monitored and repleted °-transitioned to [place] insulin once anion gap closed °-diet was advanced once anion gap closed °-06/06/18--HbA1C--12.7 ° °Diabetes mellitus type 1, poorly controlled with hyperglycemia °-Restart home Lantus dose 16 units twice daily and NovoLog pre-meal ° °Diabetic  polyneuropathy °-Continue gabapentin ° °Polysubstance abuse °-Including tobacco, cocaine °-Cessation discussed ° °GERD °-Continue Pepcid ° °Hypokalemia °-Replete ° °Suicidal ideation °-Patient has been cleared by tele-psychiatry ° °Hypotension °-Patient's blood pressure normally runs soft ° °Discharge Instructions ° ° °Allergies as of 07/17/2018   °   Reactions  ° Metformin And Related Other (See Comments)  ° Abdominal cramps and diarrhea  ° Nsaids Nausea Only  °  °  °Medication List  °  °STOP taking these medications   °nicotine 14 mg/24hr patch °Commonly known as:  NICODERM CQ - dosed in mg/24 hours °  °  °TAKE these medications   °blood glucose meter kit and supplies °Dispense based on patient and insurance preference. Use up to four times daily as directed. (FOR ICD-10 E10.9, E11.9). °  °gabapentin 300 MG capsule °Commonly known as:  NEURONTIN °Take 1 capsule (300 mg total) by mouth 4 (four) times daily. °  °insulin aspart 100 UNIT/ML injection °Commonly known as:  NovoLOG °Inject 12 Units into the skin 3 (three) times daily with meals for 30 days. °  °insulin glargine 100 UNIT/ML injection °Commonly known as:  LANTUS °Inject 0.16 mLs (16 Units total) into the skin 2 (two) times daily. °  °INSULIN SYRINGE .5CC/29G 29G X 1/2" 0.5 ML Misc °Use as directed °  °multivitamin with minerals Tabs tablet °Take 1 tablet by mouth daily. °  °oxyCODONE 5 MG immediate release tablet °Commonly known as:  Oxy IR/ROXICODONE °Take 5 mg by mouth 3 (three) times daily. °  °pantoprazole 40 MG tablet °Commonly known as:  PROTONIX °Take 40 mg by mouth daily. °  °senna-docusate 8.6-50 MG tablet °Commonly   tablet Commonly known as:  Senokot-S Take 2 tablets by mouth daily at 8 pm for 30 days.      Follow-up Information    [name], MD Follow up on 07/21/2018.   Specialty:  Internal Medicine Why:  Friday at [facility] with the Dr.  Call to reschedule if you are unable to make this appointment Contact information: [place]  [place] [address] [phone]        [facility] [place] Follow up on 07/19/2018.   Why:  Wednesday at 8:30 with [name] for your hospital follow up appointment. Contact information: [place] [address] [phone]          Allergies  Allergen Reactions   Metformin And Related Other (See Comments)    Abdominal cramps and diarrhea   Nsaids Nausea Only    Consultations:  none   Procedures/Studies: Ct Abdomen Pelvis W Contrast  Result Date: 07/11/2018 CLINICAL DATA:  Has had a bowel movement in 4 days.  Abdominal pain. EXAM: CT ABDOMEN AND PELVIS WITH CONTRAST TECHNIQUE: Multidetector CT imaging of the abdomen and pelvis was performed using the standard protocol following bolus administration of intravenous contrast. CONTRAST:  143m OMNIPAQUE IOHEXOL 300 MG/ML  SOLN COMPARISON:  02/29/2016 FINDINGS: Lower chest: No acute abnormality. Hepatobiliary: No focal liver abnormality is seen. No gallstones, gallbladder wall thickening, or biliary dilatation. Pancreas: Unremarkable. No pancreatic ductal dilatation or surrounding inflammatory changes. Spleen: Normal in size without focal abnormality. Adrenals/Urinary Tract: Adrenal glands are unremarkable. Kidneys are normal, without renal calculi, focal lesion, or hydronephrosis. Bladder is unremarkable. Stomach/Bowel: Stomach is within normal limits. Appendix appears normal. No evidence of bowel wall thickening, distention, or inflammatory changes. Moderate amount of stool in the ascending and transverse colon. Vascular/Lymphatic: Abdominal aortic atherosclerosis. No lymphadenopathy Reproductive: Prostate is unremarkable. Other: No abdominal wall hernia or abnormality. No abdominopelvic ascites. Musculoskeletal: No acute osseous abnormality. No aggressive osseous lesion. Mild degenerative disc disease with disc height loss at L5-S1. IMPRESSION: 1. No acute abdominal or pelvic pathology. 2. Moderate amount of stool in  the ascending and transverse colon. Electronically Signed   By: HKathreen Devoid  On: 07/11/2018 17:12        Discharge Exam: Vitals:   07/17/18 0559 07/17/18 0601  BP: 91/67 96/71  Pulse: 64 64  Resp: 18 18  Temp: 97.7 F (36.5 C)   SpO2: 99%    Vitals:   07/16/18 1300 07/16/18 2126 07/17/18 0559 07/17/18 0601  BP: 133/79 (!) 94/57 91/67 96/71  Pulse: 77 68 64 64  Resp: _0 Temp: 98.5 F (36.9 C) 98.1 F (36.7 C) 97.7 F (36.5 C)   TempSrc: Oral Oral Oral   SpO2: 99% 99% 99%   Weight:      Height:        General: Pt is alert, awake, not in acute distress Cardiovascular: RRR, S1/S2 +, no rubs, no gallops Respiratory: CTA bilaterally, no wheezing, no rhonchi Abdominal: Soft, NT, ND, bowel sounds + Extremities: no edema, no cyanosis   The results of significant diagnostics from this hospitalization (including imaging, microbiology, ancillary and laboratory) are listed below for reference.    Significant Diagnostic Studies: Ct Abdomen Pelvis W Contrast  Result Date: 07/11/2018 CLINICAL DATA:  Has had a bowel movement in 4 days.  Abdominal pain. EXAM: CT ABDOMEN AND PELVIS WITH CONTRAST TECHNIQUE: Multidetector CT imaging of the abdomen and pelvis was performed using the standard protocol following bolus  administration of intravenous contrast. CONTRAST:  19m OMNIPAQUE IOHEXOL 300 MG/ML  SOLN COMPARISON:  02/29/2016 FINDINGS: Lower chest: No acute abnormality. Hepatobiliary: No focal liver abnormality is seen. No gallstones, gallbladder wall thickening, or biliary dilatation. Pancreas: Unremarkable. No pancreatic ductal dilatation or surrounding inflammatory changes. Spleen: Normal in size without focal abnormality. Adrenals/Urinary Tract: Adrenal glands are unremarkable. Kidneys are normal, without renal calculi, focal lesion, or hydronephrosis. Bladder is unremarkable. Stomach/Bowel: Stomach is within normal limits. Appendix appears normal. No evidence of bowel wall  thickening, distention, or inflammatory changes. Moderate amount of stool in the ascending and transverse colon. Vascular/Lymphatic: Abdominal aortic atherosclerosis. No lymphadenopathy Reproductive: Prostate is unremarkable. Other: No abdominal wall hernia or abnormality. No abdominopelvic ascites. Musculoskeletal: No acute osseous abnormality. No aggressive osseous lesion. Mild degenerative disc disease with disc height loss at L5-S1. IMPRESSION: 1. No acute abdominal or pelvic pathology. 2. Moderate amount of stool in the ascending and transverse colon. Electronically Signed   By: HKathreen Devoid  On: 07/11/2018 17:12     Microbiology: Recent Results (from the past 240 hour(s))  MRSA PCR Screening     Status: None   Collection Time: 07/11/18  7:59 PM  Result Value Ref Range Status   MRSA by PCR NEGATIVE NEGATIVE Final    Comment:        The GeneXpert MRSA Assay (FDA approved for NASAL specimens only), is one component of a comprehensive MRSA colonization surveillance program. It is not intended to diagnose MRSA infection nor to guide or monitor treatment for MRSA infections. Performed at [facility] [address], [place] [address]  Culture, blood (routine x 2)     Status: None   Collection Time: 07/12/18  3:09 PM  Result Value Ref Range Status   Specimen Description RIGHT ANTECUBITAL  Final   Special Requests   Final    Blood Culture adequate volume BOTTLES DRAWN AEROBIC AND ANAEROBIC   Culture   Final    NO GROWTH 5 DAYS Performed at [facility] [address], [place] [address]   Report Status 07/17/2018 FINAL  Final  Culture, blood (routine x 2)     Status: None   Collection Time: 07/12/18  3:45 PM  Result Value Ref Range Status   Specimen Description RIGHT ANTECUBITAL  Final   Special Requests   Final    BOTTLES DRAWN AEROBIC AND ANAEROBIC Blood Culture adequate volume   Culture   Final    NO GROWTH 5 DAYS [facility] [address] [place] [address]   Report Status 07/17/2018 FINAL  Final     Labs: Basic Metabolic Panel: Recent Labs  Lab 07/12/18 2224 07/13/18 0302 07/13/18 0658 07/14/18 0546 07/15/18 0649 07/17/18 0710  NA 131* 132* 133* 135  --  135  K 3.6 3.3* 3.1* 3.7  --  4.2  CL 106 106 107 107  --  100  CO2 18* 18* 19* 22  --  26  GLUCOSE 125* 148* 142* 249*  --  207*  BUN _0 --  20  CREATININE 0.66 0.70 0.71 0.62  --  0.60*  CALCIUM 7.8* 7.7* 7.8* 7.9*  --  8.7*  MG  --  1.8  --  1.8 1.8 1.9   Liver Function Tests: Recent Labs  Lab 07/11/18 1515 07/12/18 1120  AST 22 19  ALT 21 18  ALKPHOS 67 60  BILITOT 2.2* 1.2  PROT 6.7 5.7*  ALBUMIN 4.0 3.5   Recent Labs  Lab 07/11/18 1515  LIPASE 22   No results for input(s): AMMONIA in the last 168 hours. CBC: Recent Labs  Lab 07/11/18 1515 07/12/18 1120 07/12/18 1509 07/13/18 0302 07/14/18 0546 07/15/18 0649  WBC 8.2 6.1 6.8 6.0 4.6 4.1  NEUTROABS 6.9 4.3  --  2.8 2.0 1.8  HGB 14.4 13.0 12.8* 12.1* 11.9* 11.6*  HCT 43.6 37.7* 38.1* 35.2* 34.1* 33.9*  MCV 98.4 94.5 97.2 94.4 94.7 94.7  PLT 231 189 163 177 147* 133*   Cardiac Enzymes: No results for input(s): CKTOTAL, CKMB, CKMBINDEX, TROPONINI in the last 168 hours. BNP: Invalid input(s): POCBNP CBG: Recent Labs  Lab 07/16/18 1624 07/16/18 2127 07/17/18 0258 07/17/18 0727 07/17/18 1053  GLUCAP 210* 176* 234* 218* 209*    Time coordinating discharge:  36 minutes  Signed:  Orson Eva, DO [facility] Pager: [phone] 07/17/2018, 11:36 AM

## 2018-07-17 NOTE — TOC Transition Note (Signed)
Transition of Care [facility]) - CM/SW Discharge Note   Patient Details  Name: Kenneth [name] MRN: [id number] Date of Birth: Oct 21, 1965  [facility] CM/SW Contact:  Ida Rogue, LCSW Phone Number:[phone] 07/17/2018, 11:44 AM   Clinical Narrative:   Confirmed with patient that he is still open to going to [facility] shelter in [place] from hospital.  Pt states he has ID, is willing to abide by shelter rules, and has glucose meter and syringes, as well as lantus.  Pharmacey is willing to provide 3 day supply of meds, and patient will use 30 day scripts on Friday when his disability check is deposited.  Pt will travel to [place] via cab that [employer] is paying for. Thanx to pharmacy and Fara Olden, for work on this discharge plan.    Final next level of care: Homeless Shelter Barriers to Discharge: No Barriers Identified   Patient Goals and CMS Choice Patient states their goals for this hospitalization and ongoing recovery are:: (P) "I need some extra help for a week or two.  I'll be fine then."      Discharge Placement   [facility].  Pt also has an appointment with his PCP and with [facility] in [facility].                       Discharge Plan and Services In-house Referral: (P) Clinical Social Work Discharge Planning Services: (P) CM Consult                                 Social Determinants of Health (SDOH) Interventions     Readmission Risk Interventions Readmission Risk Prevention Plan 07/13/2018 07/12/2018  Transportation Screening Complete Complete  PCP or Specialist Appt within 3-5 Days  Complete  Complete  Not Complete comments -   HRI or Home Care Consult Complete Complete  Social Work Consult for Recovery Care Planning/Counseling Complete -  Palliative Care Screening Not Applicable Not Applicable  Medication Review Ocean[facility]) Complete Complete  Some recent data might be hidden

## 2018-07-17 NOTE — Discharge Instructions (Signed)
Blood Glucose Monitoring, Adult °Monitoring your blood sugar (glucose) is an important part of managing your diabetes (diabetes mellitus). Blood glucose monitoring involves checking your blood glucose as often as directed and keeping a record (log) of your results over time. °Checking your blood glucose regularly and keeping a blood glucose log can: °· Help you and your health care provider adjust your diabetes management plan as needed, including your medicines or insulin. °· Help you understand how food, exercise, illnesses, and medicines affect your blood glucose. °· Let you know what your blood glucose is at any time. You can quickly find out if you have low blood glucose (hypoglycemia) or high blood glucose (hyperglycemia). °Your health care provider will set individualized treatment goals for you. Your goals will be based on your age, other medical conditions you have, and how you respond to diabetes treatment. Generally, the goal of treatment is to maintain the following blood glucose levels: °· Before meals (preprandial): 80-130 mg/dL (4.4-7.2 mmol/L). °· After meals (postprandial): below 180 mg/dL (10 mmol/L). °· A1c level: less than 7%. °Supplies needed: °· Blood glucose meter. °· Test strips for your meter. Each meter has its own strips. You must use the strips that came with your meter. °· A needle to prick your finger (lancet). Do not use a lancet more than one time. °· A device that holds the lancet (lancing device). °· A journal or log book to write down your results. °How to check your blood glucose ° °1. Wash your hands with soap and water. °2. Prick the side of your finger (not the tip) with the lancet. Use a different finger each time. °3. Gently rub the finger until a small drop of blood appears. °4. Follow instructions that come with your meter for inserting the test strip, applying blood to the strip, and using your blood glucose meter. °5. Write down your result and any notes. °Some meters  allow you to use areas of your body other than your finger (alternative sites) to test your blood. The most common alternative sites are: °· Forearm. °· Thigh. °· Palm of the hand. °If you think you may have hypoglycemia, or if you have a history of not knowing when your blood glucose is getting low (hypoglycemia unawareness), do not use alternative sites. Use your finger instead. Alternative sites may not be as accurate as the fingers, because blood flow is slower in these areas. This means that the result you get may be delayed, and it may be different from the result that you would get from your finger. °Follow these instructions at home: °Blood glucose log ° °· Every time you check your blood glucose, write down your result. Also write down any notes about things that may be affecting your blood glucose, such as your diet and exercise for the day. This information can help you and your health care provider: °? Look for patterns in your blood glucose over time. °? Adjust your diabetes management plan as needed. °· Check if your meter allows you to download your records to a computer. Most glucose meters store a record of glucose readings in the meter. °If you have type 1 diabetes: °· Check your blood glucose 2 or more times a day. °· Also check your blood glucose: °? Before every insulin injection. °? Before and after exercise. °? Before meals. °? 2 hours after a meal. °? Occasionally between 2:00 a.m. and 3:00 a.m., as directed. °? Before potentially dangerous tasks, like driving or using heavy machinery. °?   At bedtime.  You may need to check your blood glucose more often, up to 6-10 times a day, if you: ? Use an insulin pump. ? Need multiple daily injections (MDI). ? Have diabetes that is not well-controlled. ? Are ill. ? Have a history of severe hypoglycemia. ? Have hypoglycemia unawareness. If you have type 2 diabetes:  If you take insulin or other diabetes medicines, check your blood glucose 2 or  more times a day.  If you are on intensive insulin therapy, check your blood glucose 4 or more times a day. Occasionally, you may also need to check between 2:00 a.m. and 3:00 a.m., as directed.  Also check your blood glucose: ? Before and after exercise. ? Before potentially dangerous tasks, like driving or using heavy machinery.  You may need to check your blood glucose more often if: ? Your medicine is being adjusted. ? Your diabetes is not well-controlled. ? You are ill. General tips  Always keep your supplies with you.  If you have questions or need help, all blood glucose meters have a 24-hour "hotline" phone number that you can call. You may also contact your health care provider.  After you use a few boxes of test strips, adjust (calibrate) your blood glucose meter by following instructions that came with your meter. Contact a health care provider if:  Your blood glucose is at or above 240 mg/dL (13.3 mmol/L) for 2 days in a row.  You have been sick or have had a fever for 2 days or longer, and you are not getting better.  You have any of the following problems for more than 6 hours: ? You cannot eat or drink. ? You have nausea or vomiting. ? You have diarrhea. Get help right away if:  Your blood glucose is lower than 54 mg/dL (3 mmol/L).  You become confused or you have trouble thinking clearly.  You have difficulty breathing.  You have moderate or large ketone levels in your urine. Summary  Monitoring your blood sugar (glucose) is an important part of managing your diabetes (diabetes mellitus).  Blood glucose monitoring involves checking your blood glucose as often as directed and keeping a record (log) of your results over time.  Your health care provider will set individualized treatment goals for you. Your goals will be based on your age, other medical conditions you have, and how you respond to diabetes treatment.  Every time you check your blood glucose,  write down your result. Also write down any notes about things that may be affecting your blood glucose, such as your diet and exercise for the day. This information is not intended to replace advice given to you by your health care provider. Make sure you discuss any questions you have with your health care provider. Document Released: 03/11/2003 Document Revised: 01/17/2017 Document Reviewed: 08/18/2015 Elsevier Interactive Patient Education  2019 [employer].   Diabetes Mellitus and Exercise Exercising regularly is important for your overall health, especially when you have diabetes (diabetes mellitus). Exercising is not only about losing weight. It has many other health benefits, such as increasing muscle strength and bone density and reducing body fat and stress. This leads to improved fitness, flexibility, and endurance, all of which result in better overall health. Exercise has additional benefits for people with diabetes, including:  Reducing appetite.  Helping to lower and control blood glucose.  Lowering blood pressure.  Helping to control amounts of fatty substances (lipids) in the blood, such as cholesterol and triglycerides.  Helping the body to respond better to insulin (improving insulin sensitivity).  Reducing how much insulin the body needs.  Decreasing the risk for heart disease by: ? Lowering cholesterol and triglyceride levels. ? Increasing the levels of good cholesterol. ? Lowering blood glucose levels. What is my activity plan? Your health care provider or certified diabetes educator can help you make a plan for the type and frequency of exercise (activity plan) that works for you. Make sure that you:  Do at least 150 minutes of moderate-intensity or vigorous-intensity exercise each week. This could be brisk walking, biking, or water aerobics. ? Do stretching and strength exercises, such as yoga or weightlifting, at least 2 times a week. ? Spread out your  activity over at least 3 days of the week.  Get some form of physical activity every day. ? Do not go more than 2 days in a row without some kind of physical activity. ? Avoid being inactive for more than 30 minutes at a time. Take frequent breaks to walk or stretch.  Choose a type of exercise or activity that you enjoy, and set realistic goals.  Start slowly, and gradually increase the intensity of your exercise over time. What do I need to know about managing my diabetes?   Check your blood glucose before and after exercising. ? If your blood glucose is 240 mg/dL (13.3 mmol/L) or higher before you exercise, check your urine for ketones. If you have ketones in your urine, do not exercise until your blood glucose returns to normal. ? If your blood glucose is 100 mg/dL (5.6 mmol/L) or lower, eat a snack containing 15-20 grams of carbohydrate. Check your blood glucose 15 minutes after the snack to make sure that your level is above 100 mg/dL (5.6 mmol/L) before you start your exercise.  Know the symptoms of low blood glucose (hypoglycemia) and how to treat it. Your risk for hypoglycemia increases during and after exercise. Common symptoms of hypoglycemia can include: ? Hunger. ? Anxiety. ? Sweating and feeling clammy. ? Confusion. ? Dizziness or feeling light-headed. ? Increased heart rate or palpitations. ? Blurry vision. ? Tingling or numbness around the mouth, lips, or tongue. ? Tremors or shakes. ? Irritability.  Keep a rapid-acting carbohydrate snack available before, during, and after exercise to help prevent or treat hypoglycemia.  Avoid injecting insulin into areas of the body that are going to be exercised. For example, avoid injecting insulin into: ? The arms, when playing tennis. ? The legs, when jogging.  Keep records of your exercise habits. Doing this can help you and your health care provider adjust your diabetes management plan as needed. Write down: ? Food that you  eat before and after you exercise. ? Blood glucose levels before and after you exercise. ? The type and amount of exercise you have done. ? When your insulin is expected to peak, if you use insulin. Avoid exercising at times when your insulin is peaking.  When you start a new exercise or activity, work with your health care provider to make sure the activity is safe for you, and to adjust your insulin, medicines, or food intake as needed.  Drink plenty of water while you exercise to prevent dehydration or heat stroke. Drink enough fluid to keep your urine clear or pale yellow. Summary  Exercising regularly is important for your overall health, especially when you have diabetes (diabetes mellitus).  Exercising has many health benefits, such as increasing muscle strength and bone density and reducing  body fat and stress.  Your health care provider or certified diabetes educator can help you make a plan for the type and frequency of exercise (activity plan) that works for you.  When you start a new exercise or activity, work with your health care provider to make sure the activity is safe for you, and to adjust your insulin, medicines, or food intake as needed. This information is not intended to replace advice given to you by your health care provider. Make sure you discuss any questions you have with your health care provider. Document Released: 05/29/2003 Document Revised: 09/16/2016 Document Reviewed: 08/18/2015 Elsevier Interactive Patient Education  2019 [place].   Correction Insulin  Correction insulin, also called corrective insulin or a supplemental dose, is a small amount of insulin that can be used to lower your blood sugar (glucose) if it is too high. You may be instructed to check your blood glucose at certain times of the day and to use correction insulin as needed to lower your blood glucose to your target range. Correction insulin is primarily used as part of diabetes  management. It may also be prescribed for people who do not have diabetes. What is a correction scale? A correction scale, also called a sliding scale, is prescribed by your health care provider to help you determine when you need correction insulin. Your correction scale is based on your individual treatment goals, and it has two parts:  Ranges of blood glucose levels.  How much correction insulin to give yourself if your blood sugar falls within a certain range. If your blood glucose is in your desired range, you will not need correction insulin and you should take your normal insulin dose. What type of insulin do I need? Your health care provider may prescribe rapid-acting or short-acting insulin for you to use as correction insulin. Rapid-acting insulin:  Starts working quickly, in as little as 5 minutes.  Can last for 3-6 hours.  Works well when taken right before a meal to quickly lower blood glucose. Short-acting insulin:  Starts working in about 30 minutes.  Can last for 6-8 hours.  Should be taken about 30 minutes before you start eating a meal. Talk with your health care provider or pharmacist about which type of correction insulin to take and when to take it. If you use insulin to control your diabetes, you should use correction insulin in addition to the longer-acting (basal) insulin that you normally use. How do I manage my blood glucose with correction insulin? Giving a correction dose  Check your blood glucose as directed by your health care provider.  Use your correction scale to find the range that your blood glucose is in.  Identify the units of insulin that match your blood glucose range.  Make sure you have food available that you can eat in the next 15-30 minutes, after your correction dose.  Give yourself the dose of correction insulin that your health care provider has prescribed in your correction scale. Always make sure you are using the right type of  insulin. ? If your correction insulin is rapid-acting, start eating a meal within 15 minutes after your correction dose to keep your blood glucose from getting too low. ? If your correction insulin is short-acting, start eating a meal within 30 minutes after your correction dose to keep your blood glucose from getting too low. Keeping a blood glucose log   Write down your blood glucose test results and the amount of insulin that  you give yourself. Do this every time you check blood glucose or take insulin. Bring this log with you to your medical visits. This information will help your health care provider to manage your medicines.  Note anything that may affect your blood glucose, such as: ? Changes in normal exercise or activity. ? Changes in your normal schedule, such as changes in your sleep routine, going on vacation, changing your diet, or holidays. ? New over-the-counter or prescription medicines. ? Illness, stress, or anxiety. ? Changes in the time that you took your medicine or insulin. ? Changes in your meals, such as skipping a meal, having a late meal, or dining out. ? Eating things that may affect blood glucose, such as snacks, meal portions that are larger than normal, drinks that contain sugar, or eating less than usual. What do I need to know about hyperglycemia and hypoglycemia? What is hyperglycemia? Hyperglycemia, also called high blood glucose, occurs when blood glucose is too high. Make sure you know the early signs of hyperglycemia, such as:  Increased thirst.  Hunger.  Feeling very tired.  Needing to urinate more often than usual.  Blurry vision. What is hypoglycemia? Hypoglycemia is also called low blood glucose. Be aware of stacking your insulin doses. This happens when you correct a high blood glucose by giving yourself extra insulin too soon after a previous correction dose or mealtime dose. This may cause you to have too much insulin in your body and may put  you at risk for hypoglycemia. Hypoglycemia occurs with a blood glucose level at or below 70 mg/dL (3.9 mmol/L). It is important to know the symptoms of hypoglycemia and treat it right away. Always have a 15-gram rapid-acting carbohydrate snack with you to treat low blood glucose. Family members and close friends should also know the symptoms and should understand how to treat hypoglycemia, in case you are not able to treat yourself. What are the symptoms of hypoglycemia? Hypoglycemia symptoms can include:  Hunger.  Anxiety.  Sweating and feeling clammy.  Confusion.  Dizziness or light-headedness.  Sleepiness.  Nausea.  Increased heart rate.  Headache.  Blurry vision.  Jerky movements that you cannot control (seizure).  Nightmares.  Tingling or numbness around the mouth, lips, or tongue.  A change in speech.  Decreased ability to concentrate.  A change in coordination.  Restless sleep.  Tremors or shakes.  Fainting.  Irritability. How do I treat hypoglycemia? If you are alert and able to swallow safely, follow the 15:15 rule:  Take 15 grams of a rapid-acting carbohydrate. Rapid-acting options include: ? 1 tube of glucose gel. ? 3 glucose pills. ? 6-8 pieces of hard candy. ? 4 oz (120 mL) of fruit juice. ? 4 oz (120 mL) of regular (not diet) soda.  Check your blood glucose 15 minutes after you take the carbohydrate. ? If the repeat blood glucose level is still at or below 70 mg/dL (3.9 mmol/L), take 15 grams of a carbohydrate again. ? If your blood glucose level does not increase above 70 mg/dL (3.9 mmol/L) after 3 tries, seek emergency medical care.  After your blood glucose level returns to normal, eat a meal or a snack within 1 hour.  How do I treat severe hypoglycemia? Severe hypoglycemia is when your blood glucose level is at or below 54 mg/dL (3 mmol/L). Severe hypoglycemia is an emergency. Do not wait to see if the symptoms will go away. Get medical  help right away. Call your local emergency services (911  in the [place] Do not drive yourself to the hospital. If you have severe hypoglycemia and you cannot eat or drink, you may need an injection of glucagon. A family member or close friend should learn how to check your blood glucose and how to give you a glucagon injection. Ask your health care provider if you need to have an emergency glucagon injection kit available. Severe hypoglycemia may need to be treated in a hospital. The treatment may include getting glucose through an IV tube. You may also need treatment for the cause of your hypoglycemia. Why do I need correction insulin if I do not have diabetes? If you do not have diabetes, your health care provider may prescribe insulin because:  Keeping your blood glucose in the target range is important for your overall health.  You are taking medicines that cause your blood glucose to be higher than normal. Contact a health care provider if:  You develop low blood glucose that you are not able to treat yourself.  You have high blood glucose that you are not able to correct with correction insulin.  Your blood glucose is often too low.  You used emergency glucagon to treat low blood glucose. Get help right away if:  You become unresponsive. If this happens, someone else should call emergency services (911 in the [place] right away.  Your blood glucose is lower than 54 mg/dL (3.0 mmol/L).  You become confused or you have trouble thinking clearly.  You have difficulty breathing. Summary  Correction insulin is a small amount of insulin that can be used to lower your blood sugar (glucose) if it is too high.  Talk with your health care provider or pharmacist about which type of correction insulin to take and when to take it. If you use insulin to control your diabetes, you should use correction insulin in addition to the longer-acting (basal) insulin that you normally use.  You may be  instructed to check your blood glucose at certain times of the day and to use correction insulin as needed to lower your blood glucose to your target range. Always keep a log of your blood glucose values and the amount of insulin that you used.  It is important to know the symptoms of hypoglycemia and treat it right away. Always have a 15-gram rapid-acting carbohydrate snack with you to treat low blood glucose. Family members and close friends should also know the symptoms and should understand how to treat hypoglycemia, in case you are not able to treat yourself. This information is not intended to replace advice given to you by your health care provider. Make sure you discuss any questions you have with your health care provider. Document Released: 07/30/2010 Document Revised: 12/05/2015 Document Reviewed: 12/05/2015 Elsevier Interactive Patient Education  [employer].

## 2018-09-11 IMAGING — DX DG LUMBAR SPINE COMPLETE 4+V
5 series · 5 of 5 positions shown · non-contrast
Comparison: CT scan 02/29/2016 and lumbar spine 11/15/2014

CLINICAL DATA: Constant burning back pain

EXAM:
LUMBAR SPINE - COMPLETE 4+ VIEW

[l-spine ap]
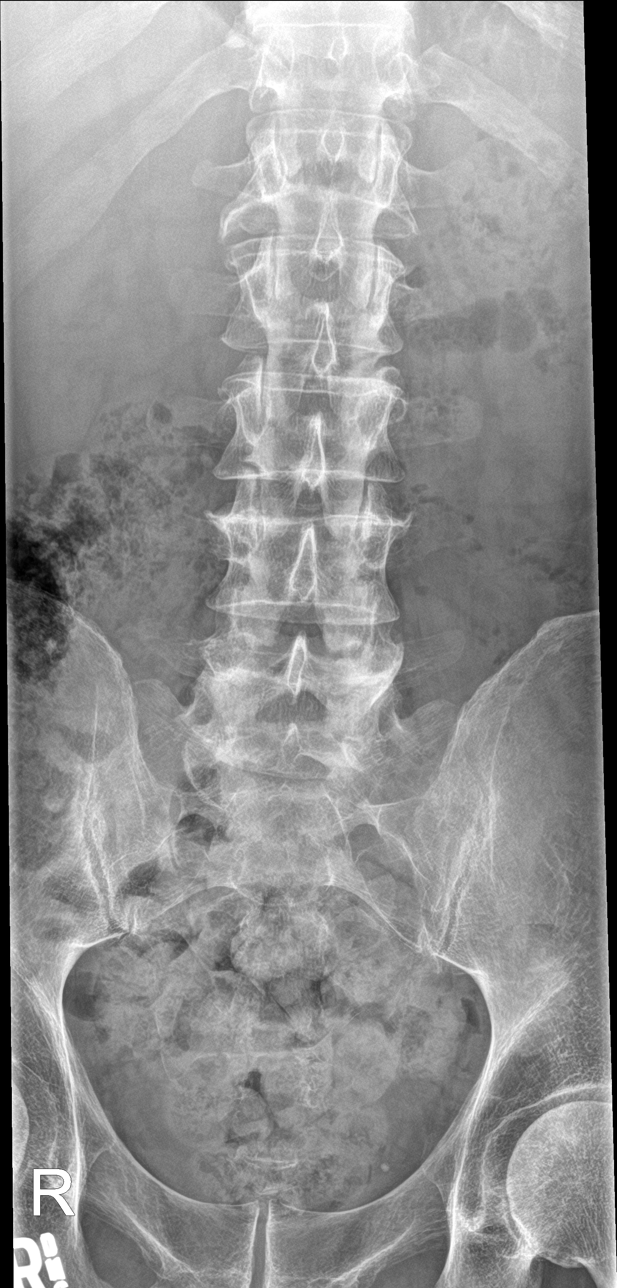

[l-spine obl (1 of 2)]
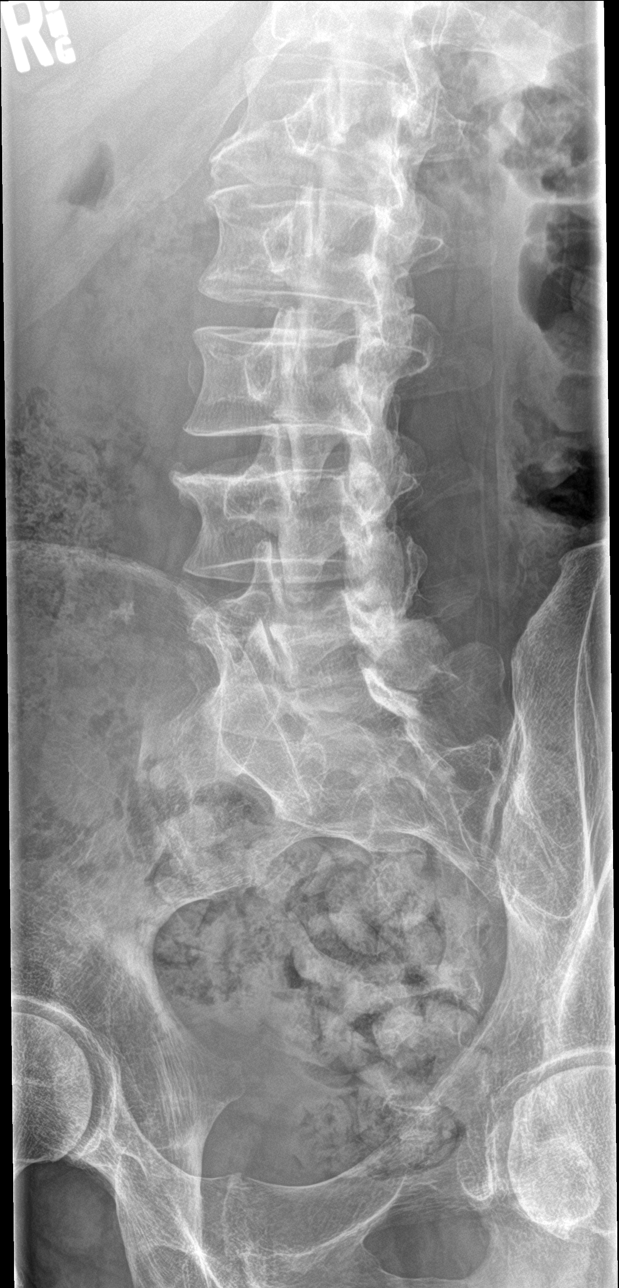

[l-spine obl (2 of 2)]
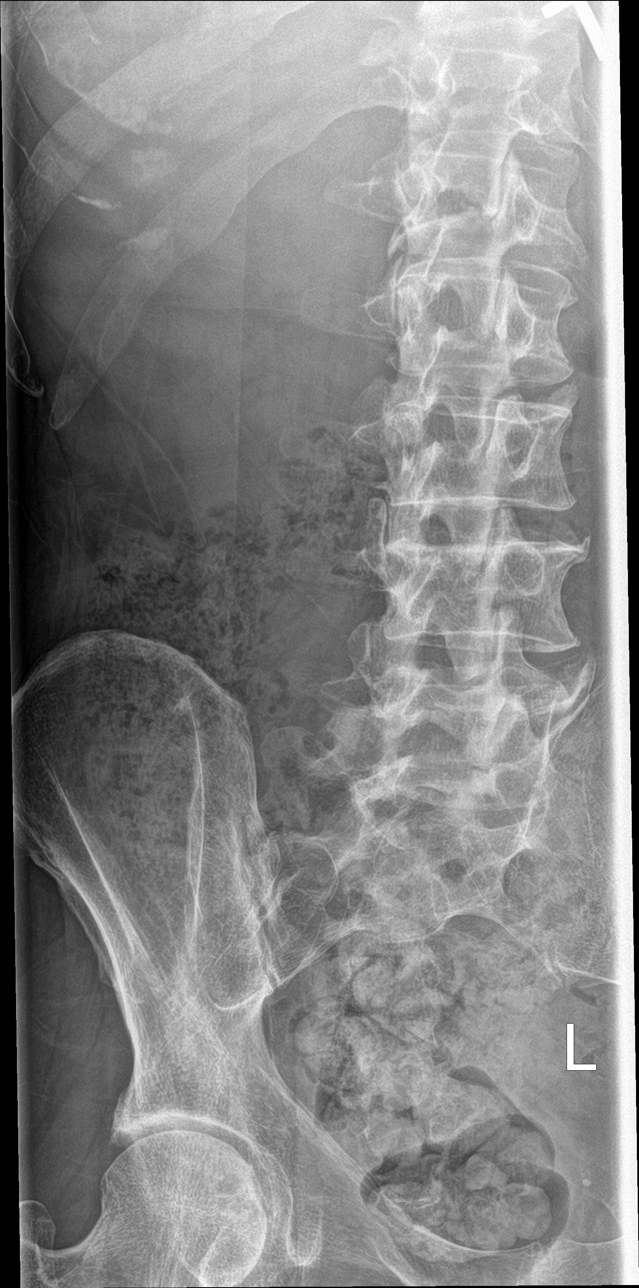

[l-spine lat]
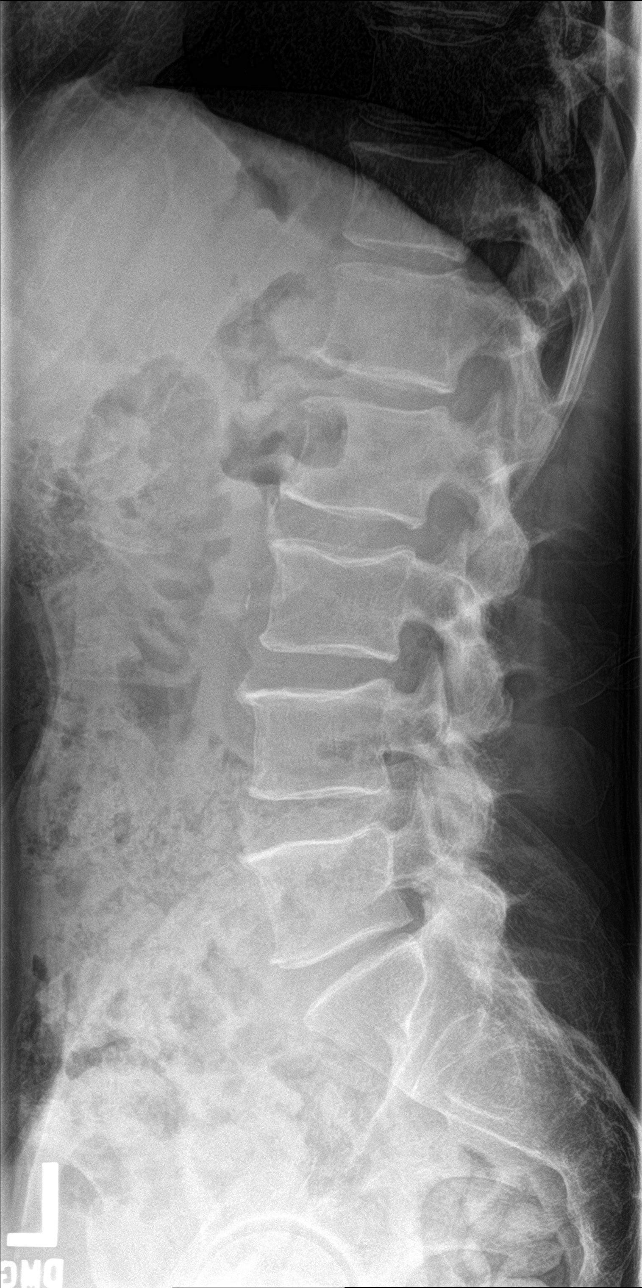

[l-spine spot]
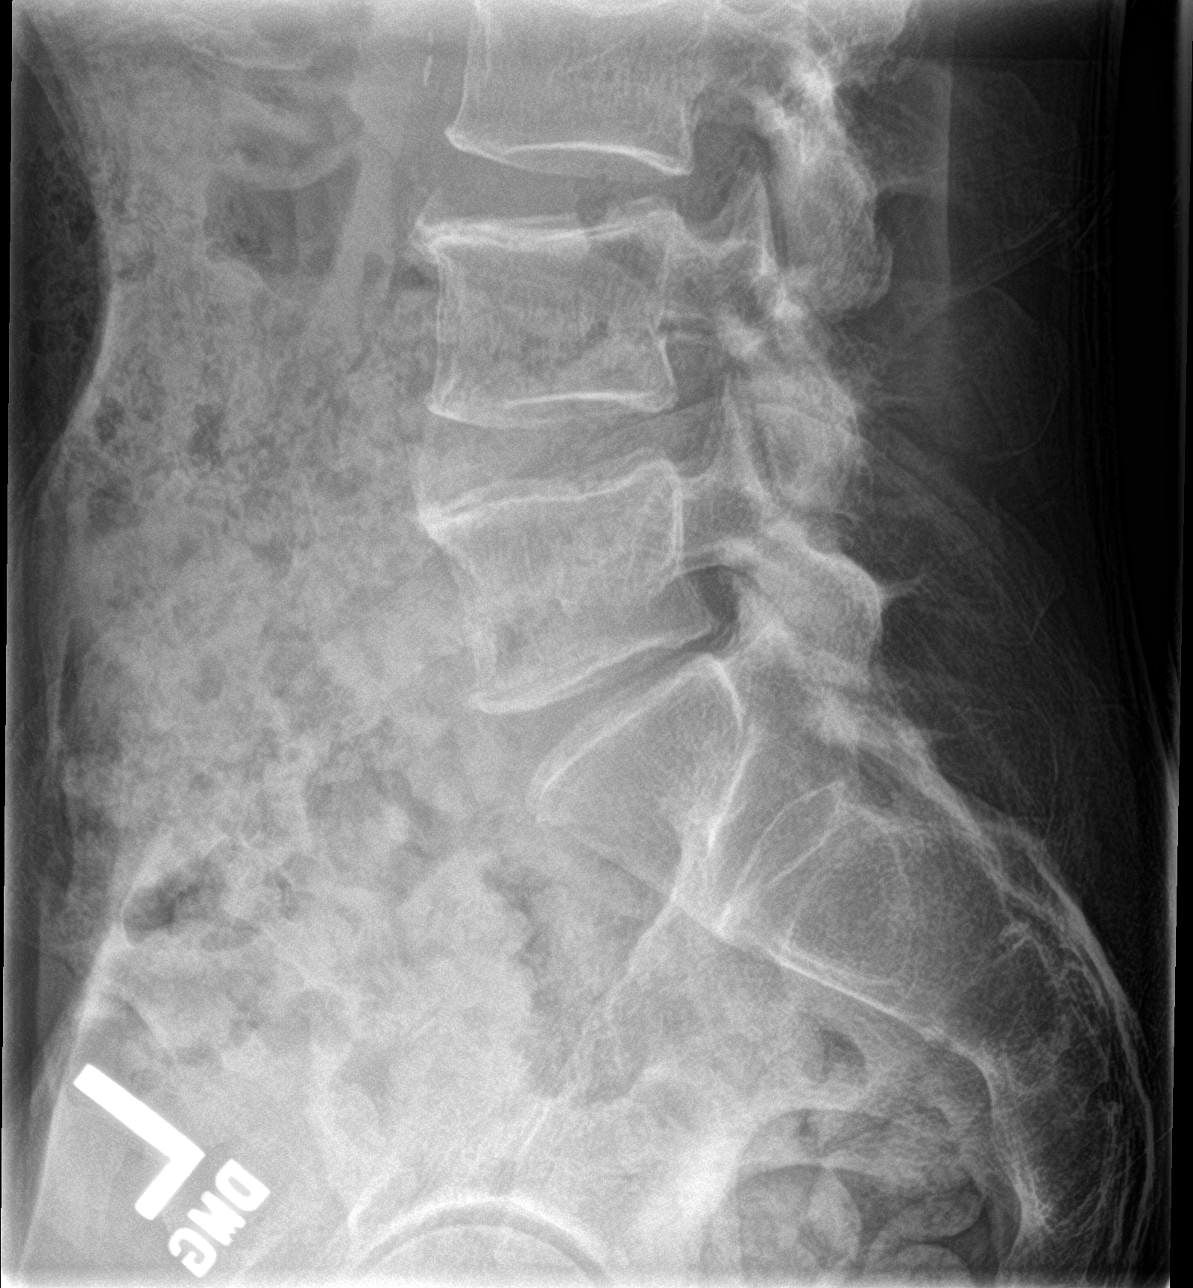

[5 of 5 positions shown; findings below may reference images not displayed]

FINDINGS: Five views of the lumbar spine submitted. No acute fracture or
subluxation. Moderate anterior spurring upper endplate of L3-L4 and
L5 vertebral body. There is minimal disc space flattening with mild
anterior spurring at L1-L2 level. Mild facet degenerative changes
noted L4 and L5 level. Moderate disc space flattening at L5-S1
level.
IMPRESSION: No acute fracture or subluxation. Mild degenerative changes as
described above.

## 2018-09-29 ENCOUNTER — Inpatient Hospital Stay (HOSPITAL_COMMUNITY): Payer: Medicaid Other

## 2018-09-29 ENCOUNTER — Inpatient Hospital Stay (HOSPITAL_COMMUNITY)
Admission: EM | Admit: 2018-09-29 | Discharge: 2018-10-05 | DRG: 637 | Disposition: A | Discharge status: Home / Self Care | Payer: Medicaid Other | Attending: Internal Medicine | Admitting: Internal Medicine

## 2018-09-29 ENCOUNTER — Other Ambulatory Visit: Payer: Self-pay

## 2018-09-29 ENCOUNTER — Encounter (HOSPITAL_COMMUNITY): Payer: Self-pay

## 2018-09-29 DIAGNOSIS — F1410 Cocaine abuse, uncomplicated: Secondary | ICD-10-CM | POA: Diagnosis present

## 2018-09-29 DIAGNOSIS — E1110 Type 2 diabetes mellitus with ketoacidosis without coma: Secondary | ICD-10-CM | POA: Diagnosis present

## 2018-09-29 DIAGNOSIS — E1140 Type 2 diabetes mellitus with diabetic neuropathy, unspecified: Secondary | ICD-10-CM | POA: Diagnosis present

## 2018-09-29 DIAGNOSIS — Z79899 Other long term (current) drug therapy: Secondary | ICD-10-CM

## 2018-09-29 DIAGNOSIS — F332 Major depressive disorder, recurrent severe without psychotic features: Secondary | ICD-10-CM | POA: Diagnosis present

## 2018-09-29 DIAGNOSIS — E871 Hypo-osmolality and hyponatremia: Secondary | ICD-10-CM | POA: Diagnosis present

## 2018-09-29 DIAGNOSIS — Z9119 Patient's noncompliance with other medical treatment and regimen: Secondary | ICD-10-CM | POA: Diagnosis not present

## 2018-09-29 DIAGNOSIS — N179 Acute kidney failure, unspecified: Secondary | ICD-10-CM | POA: Diagnosis present

## 2018-09-29 DIAGNOSIS — G8929 Other chronic pain: Secondary | ICD-10-CM | POA: Diagnosis present

## 2018-09-29 DIAGNOSIS — Z590 Homelessness: Secondary | ICD-10-CM | POA: Diagnosis not present

## 2018-09-29 DIAGNOSIS — Z794 Long term (current) use of insulin: Secondary | ICD-10-CM | POA: Diagnosis not present

## 2018-09-29 DIAGNOSIS — K219 Gastro-esophageal reflux disease without esophagitis: Secondary | ICD-10-CM | POA: Diagnosis present

## 2018-09-29 DIAGNOSIS — E119 Type 2 diabetes mellitus without complications: Secondary | ICD-10-CM

## 2018-09-29 DIAGNOSIS — E0590 Thyrotoxicosis, unspecified without thyrotoxic crisis or storm: Secondary | ICD-10-CM | POA: Diagnosis present

## 2018-09-29 DIAGNOSIS — E1010 Type 1 diabetes mellitus with ketoacidosis without coma: Secondary | ICD-10-CM | POA: Diagnosis not present

## 2018-09-29 DIAGNOSIS — Z720 Tobacco use: Secondary | ICD-10-CM | POA: Diagnosis present

## 2018-09-29 DIAGNOSIS — F17210 Nicotine dependence, cigarettes, uncomplicated: Secondary | ICD-10-CM | POA: Diagnosis present

## 2018-09-29 DIAGNOSIS — E875 Hyperkalemia: Secondary | ICD-10-CM | POA: Diagnosis present

## 2018-09-29 DIAGNOSIS — U071 COVID-19: Secondary | ICD-10-CM | POA: Diagnosis present

## 2018-09-29 DIAGNOSIS — I10 Essential (primary) hypertension: Secondary | ICD-10-CM | POA: Diagnosis present

## 2018-09-29 DIAGNOSIS — Z9114 Patient's other noncompliance with medication regimen: Secondary | ICD-10-CM | POA: Diagnosis not present

## 2018-09-29 DIAGNOSIS — Z833 Family history of diabetes mellitus: Secondary | ICD-10-CM

## 2018-09-29 LAB — CBC
HCT: 44.8 % (ref 39.0–52.0)
Hemoglobin: 14.9 g/dL (ref 13.0–17.0)
MCH: 32.3 pg (ref 26.0–34.0)
MCHC: 33.3 g/dL (ref 30.0–36.0)
MCV: 97.2 fL (ref 80.0–100.0)
Platelets: 212 10*3/uL (ref 150–400)
RBC: 4.61 MIL/uL (ref 4.22–5.81)
RDW: 12.4 % (ref 11.5–15.5)
WBC: 7.6 10*3/uL (ref 4.0–10.5)
nRBC: 0 % (ref 0.0–0.2)

## 2018-09-29 LAB — BASIC METABOLIC PANEL
Anion gap: 27 — ABNORMAL HIGH (ref 5–15)
Anion gap: 17 — ABNORMAL HIGH (ref 5–15)
Anion gap: 15 (ref 5–15)
Anion gap: 11 (ref 5–15)
BUN: 29 mg/dL — ABNORMAL HIGH (ref 6–20)
BUN: 25 mg/dL — ABNORMAL HIGH (ref 6–20)
BUN: 23 mg/dL — ABNORMAL HIGH (ref 6–20)
BUN: 19 mg/dL (ref 6–20)
CO2: 11 mmol/L — ABNORMAL LOW (ref 22–32)
CO2: 14 mmol/L — ABNORMAL LOW (ref 22–32)
CO2: 16 mmol/L — ABNORMAL LOW (ref 22–32)
CO2: 20 mmol/L — ABNORMAL LOW (ref 22–32)
Calcium: 9 mg/dL (ref 8.9–10.3)
Calcium: 8.1 mg/dL — ABNORMAL LOW (ref 8.9–10.3)
Calcium: 8.4 mg/dL — ABNORMAL LOW (ref 8.9–10.3)
Calcium: 8.1 mg/dL — ABNORMAL LOW (ref 8.9–10.3)
Chloride: 84 mmol/L — ABNORMAL LOW (ref 98–111)
Chloride: 99 mmol/L (ref 98–111)
Chloride: 100 mmol/L (ref 98–111)
Chloride: 100 mmol/L (ref 98–111)
Creatinine, Ser: 1.57 mg/dL — ABNORMAL HIGH (ref 0.61–1.24)
Creatinine, Ser: 1.25 mg/dL — ABNORMAL HIGH (ref 0.61–1.24)
Creatinine, Ser: 1.07 mg/dL (ref 0.61–1.24)
Creatinine, Ser: 0.86 mg/dL (ref 0.61–1.24)
GFR calc Af Amer: 57 mL/min — ABNORMAL LOW (ref >60)
GFR calc Af Amer: >60 mL/min (ref >60)
GFR calc Af Amer: >60 mL/min (ref >60)
GFR calc Af Amer: >60 mL/min (ref >60)
GFR calc non Af Amer: 49 mL/min — ABNORMAL LOW (ref >60)
GFR calc non Af Amer: >60 mL/min (ref >60)
GFR calc non Af Amer: >60 mL/min (ref >60)
GFR calc non Af Amer: >60 mL/min (ref >60)
Glucose, Bld: 757 mg/dL (ref 70–99)
Glucose, Bld: 351 mg/dL — ABNORMAL HIGH (ref 70–99)
Glucose, Bld: 235 mg/dL — ABNORMAL HIGH (ref 70–99)
Glucose, Bld: 121 mg/dL — ABNORMAL HIGH (ref 70–99)
Potassium: 5.6 mmol/L — ABNORMAL HIGH (ref 3.5–5.1)
Potassium: 3.9 mmol/L (ref 3.5–5.1)
Potassium: 4 mmol/L (ref 3.5–5.1)
Potassium: 3.7 mmol/L (ref 3.5–5.1)
Sodium: 122 mmol/L — ABNORMAL LOW (ref 135–145)
Sodium: 130 mmol/L — ABNORMAL LOW (ref 135–145)
Sodium: 131 mmol/L — ABNORMAL LOW (ref 135–145)
Sodium: 131 mmol/L — ABNORMAL LOW (ref 135–145)

## 2018-09-29 LAB — BLOOD GAS, VENOUS
Acid-base deficit: 19.1 mmol/L — ABNORMAL HIGH (ref 0.0–2.0)
Bicarbonate: 9.2 mmol/L — ABNORMAL LOW (ref 20.0–28.0)
FIO2: 21
O2 Saturation: 80.8 %
Patient temperature: 98.6
pCO2, Ven: 28.7 mmHg — ABNORMAL LOW (ref 44.0–60.0)
pH, Ven: 7.133 — CL (ref 7.250–7.430)
pO2, Ven: 56.9 mmHg — ABNORMAL HIGH (ref 32.0–45.0)

## 2018-09-29 LAB — HEMOGLOBIN A1C
Hgb A1c MFr Bld: 10 % — ABNORMAL HIGH (ref 4.8–5.6)
Mean Plasma Glucose: 240.3 mg/dL

## 2018-09-29 LAB — URINALYSIS, ROUTINE W REFLEX MICROSCOPIC
Bacteria, UA: NONE SEEN
Bilirubin Urine: NEGATIVE
Glucose, UA: >=500 mg/dL — AB
Hgb urine dipstick: NEGATIVE
Ketones, ur: 80 mg/dL — AB
Leukocytes,Ua: NEGATIVE
Nitrite: NEGATIVE
Protein, ur: NEGATIVE mg/dL
Specific Gravity, Urine: 1.023 (ref 1.005–1.030)
pH: 5 (ref 5.0–8.0)

## 2018-09-29 LAB — RAPID URINE DRUG SCREEN, HOSP PERFORMED
Amphetamines: NOT DETECTED
Barbiturates: NOT DETECTED
Benzodiazepines: NOT DETECTED
Cocaine: POSITIVE — AB
Opiates: NOT DETECTED
Tetrahydrocannabinol: NOT DETECTED

## 2018-09-29 LAB — SARS CORONAVIRUS 2 BY RT PCR (HOSPITAL ORDER, PERFORMED IN ~~LOC~~ HOSPITAL LAB): SARS Coronavirus 2: POSITIVE — AB

## 2018-09-29 LAB — PROTIME-INR
INR: 1.4 — ABNORMAL HIGH (ref 0.8–1.2)
Prothrombin Time: 16.6 seconds — ABNORMAL HIGH (ref 11.4–15.2)

## 2018-09-29 LAB — GLUCOSE, CAPILLARY
Glucose-Capillary: 263 mg/dL — ABNORMAL HIGH (ref 70–99)
Glucose-Capillary: 183 mg/dL — ABNORMAL HIGH (ref 70–99)
Glucose-Capillary: 146 mg/dL — ABNORMAL HIGH (ref 70–99)
Glucose-Capillary: 113 mg/dL — ABNORMAL HIGH (ref 70–99)

## 2018-09-29 LAB — CBG MONITORING, ED
Glucose-Capillary: >600 mg/dL (ref 70–99)
Glucose-Capillary: >600 mg/dL (ref 70–99)
Glucose-Capillary: 512 mg/dL (ref 70–99)
Glucose-Capillary: 372 mg/dL — ABNORMAL HIGH (ref 70–99)

## 2018-09-29 MED ORDER — SODIUM CHLORIDE 0.9 % IV SOLN
INTRAVENOUS | Status: DC
  Administered 2018-09-29 – 2018-09-30 (×3): via INTRAVENOUS

## 2018-09-29 MED ORDER — INSULIN GLARGINE 100 UNIT/ML ~~LOC~~ SOLN
10.0000 [IU] | Freq: Every day | SUBCUTANEOUS | Status: DC
  Administered 2018-09-30 – 2018-10-03 (×5): 10 [IU] via SUBCUTANEOUS
  Filled 2018-09-29 (×6): qty 0.1

## 2018-09-29 MED ORDER — INSULIN REGULAR(HUMAN) IN NACL 100-0.9 UT/100ML-% IV SOLN: INTRAVENOUS | Status: DC

## 2018-09-29 MED ORDER — HEPARIN SODIUM (PORCINE) 5000 UNIT/ML IJ SOLN
5000.0000 [IU] | Freq: Three times a day (TID) | INTRAMUSCULAR | Status: DC
  Administered 2018-09-29 – 2018-10-05 (×15): 5000 [IU] via SUBCUTANEOUS
  Filled 2018-09-29 (×15): qty 1

## 2018-09-29 MED ORDER — SODIUM CHLORIDE 0.9 % IV SOLN: INTRAVENOUS | Status: DC

## 2018-09-29 MED ORDER — INSULIN ASPART 100 UNIT/ML ~~LOC~~ SOLN
0.0000 [IU] | SUBCUTANEOUS | Status: DC
  Administered 2018-09-30: 05:00:00 15 [IU] via SUBCUTANEOUS
  Administered 2018-09-30 (×3): 8 [IU] via SUBCUTANEOUS
  Administered 2018-10-01: 2 [IU] via SUBCUTANEOUS
  Administered 2018-10-01: 15 [IU] via SUBCUTANEOUS
  Administered 2018-10-01: 2 [IU] via SUBCUTANEOUS
  Administered 2018-10-01 (×2): 11 [IU] via SUBCUTANEOUS
  Administered 2018-10-01: 2 [IU] via SUBCUTANEOUS
  Administered 2018-10-02: 8 [IU] via SUBCUTANEOUS
  Administered 2018-10-02: 3 [IU] via SUBCUTANEOUS
  Administered 2018-10-02: 11 [IU] via SUBCUTANEOUS
  Administered 2018-10-02: 3 [IU] via SUBCUTANEOUS
  Administered 2018-10-03 (×2): 8 [IU] via SUBCUTANEOUS
  Administered 2018-10-03: 2 [IU] via SUBCUTANEOUS
  Administered 2018-10-03: 8 [IU] via SUBCUTANEOUS
  Administered 2018-10-04: 16:00:00 5 [IU] via SUBCUTANEOUS
  Administered 2018-10-04: 8 [IU] via SUBCUTANEOUS
  Administered 2018-10-04: 3 [IU] via SUBCUTANEOUS
  Administered 2018-10-04 (×2): 8 [IU] via SUBCUTANEOUS
  Administered 2018-10-05: 08:00:00 3 [IU] via SUBCUTANEOUS
  Administered 2018-10-05: 5 [IU] via SUBCUTANEOUS
  Administered 2018-10-05: 3 [IU] via SUBCUTANEOUS

## 2018-09-29 MED ORDER — INSULIN ASPART 100 UNIT/ML ~~LOC~~ SOLN: 0.0000 [IU] | SUBCUTANEOUS | Status: DC

## 2018-09-29 MED ORDER — DEXTROSE-NACL 5-0.45 % IV SOLN: INTRAVENOUS | Status: DC

## 2018-09-29 MED ORDER — LACTATED RINGERS IV BOLUS
1000.0000 mL | Freq: Once | INTRAVENOUS | Status: AC
  Administered 2018-09-29: 1000 mL via INTRAVENOUS

## 2018-09-29 MED ORDER — NICOTINE 14 MG/24HR TD PT24
14.0000 mg | MEDICATED_PATCH | TRANSDERMAL | Status: DC
  Administered 2018-09-30 – 2018-10-04 (×5): 14 mg via TRANSDERMAL
  Filled 2018-09-29 (×7): qty 1

## 2018-09-29 MED ORDER — DEXTROSE-NACL 5-0.45 % IV SOLN
INTRAVENOUS | Status: DC
  Administered 2018-09-29: 19:00:00 via INTRAVENOUS

## 2018-09-29 MED ORDER — INSULIN REGULAR(HUMAN) IN NACL 100-0.9 UT/100ML-% IV SOLN
INTRAVENOUS | Status: DC
  Administered 2018-09-29: 13:00:00 5.4 [IU]/h via INTRAVENOUS
  Filled 2018-09-29: qty 100

## 2018-09-29 MED ORDER — CHLORHEXIDINE GLUCONATE CLOTH 2 % EX PADS
6.0000 | MEDICATED_PAD | Freq: Every day | CUTANEOUS | Status: DC
  Administered 2018-10-02 – 2018-10-05 (×4): 6 via TOPICAL

## 2018-09-29 MED ORDER — METOCLOPRAMIDE HCL 5 MG/ML IJ SOLN
10.0000 mg | Freq: Once | INTRAMUSCULAR | Status: AC
  Administered 2018-09-29: 10 mg via INTRAVENOUS
  Filled 2018-09-29: qty 2

## 2018-09-29 NOTE — H&P (Addendum)
History and Physical  Kenneth [name] IZT:[id number] DOB: [birth year] DOA: 09/29/2018   Patient coming from: Homeless & is able to ambulate  Chief Complaint: Not feeling well  HPI: Kenneth [name] is a 53 y.o. male with medical history significant for uncontrolled diabetes mellitus, tobacco abuse, polysubstance abuse, hypertension, GERD presented to the ED complaining of generalized pain, nausea, lethargy for the past couple of days.  Patient also noted significant polyuria and polydipsia in the past few days.  Patient denies any significant abdominal pain, vomiting, chest pain, cough, runny nose, sore throat, denies any fever/chills.  Of note, patient is homeless, and does not remember the last time he took his medications including his insulin.  ED Course: On presentation, vital signs noted for some tachycardia, otherwise unremarkable.  Labs notable for BG 757, VBG showed metabolic acidosis, with anion gap, hyponatremia, mild hyperkalemia, AKI, UA showed greater than 500 glucose with ketones, UDS positive for cocaine.  Chest x-ray unremarkable.  Patient noted to be in DKA, received IV fluid boluses, insulin drip started in the ED. [facility] hospitalist consulted to admit patient for further management.  Review of Systems: Review of systems are otherwise negative   Past Medical History:  Diagnosis Date   Arthritis    Chronic generalized pain    Diabetes mellitus    DKA (diabetic ketoacidosis) ([place]) 07/2014   Malnutrition ([place])    Neuropathy    Noncompliance with medication regimen    Thyroid disease    History reviewed. No pertinent surgical history.  Social History:  reports that he has been smoking. He has a 4.00 pack-year smoking history. He has never used smokeless tobacco. He reports current drug use. Drug: Cocaine. He reports that he does not drink alcohol.   Allergies  Allergen Reactions   Metformin And Related Other (See Comments)    Abdominal cramps and diarrhea    Nsaids Nausea Only    Family History  Problem Relation Age of Onset   Diabetes Mother    Seizures Sister    Diabetes Sister       Prior to Admission medications   Medication Sig Start Date End Date Taking? Authorizing Provider  blood glucose meter kit and supplies Dispense based on patient and insurance preference. Use up to four times daily as directed. (FOR ICD-10 E10.9, E11.9). 05/04/18  Yes Johnson, Clanford L, MD  gabapentin (NEURONTIN) 300 MG capsule Take 1 capsule (300 mg total) by mouth 4 (four) times daily. 07/17/18  Yes Tat, Shanon Brow, MD  insulin glargine (LANTUS) 100 UNIT/ML injection Inject 0.16 mLs (16 Units total) into the skin 2 (two) times daily. 07/17/18  Yes Tat, Shanon Brow, MD  INSULIN SYRINGE .5CC/29G 29G X 1/2" 0.5 ML MISC Use as directed 07/12/18  Yes Shah, Pratik D, DO  lisinopril (ZESTRIL) 5 MG tablet Take 5 mg by mouth daily. 08/08/18  Yes [provider]  Multiple Vitamin (MULTIVITAMIN WITH MINERALS) TABS tablet Take 1 tablet by mouth daily. 06/08/18  Yes Barton Dubois, MD  oxyCODONE (OXY IR/ROXICODONE) 5 MG immediate release tablet Take 5 mg by mouth 3 (three) times daily.   Yes [provider]  pantoprazole (PROTONIX) 40 MG tablet Take 40 mg by mouth daily.   Yes [provider]  famotidine (PEPCID) 20 MG tablet Take 1 tablet (20 mg total) by mouth 2 (two) times daily. Patient not taking: Reported on 09/29/2018 07/17/18   TatShanon Brow, MD  insulin aspart (NOVOLOG) 100 UNIT/ML injection Inject 10 Units into the skin 3 (three)  times daily with meals for 30 days. 06/07/18 07/12/18  Barton Dubois, MD  insulin aspart (NOVOLOG) 100 UNIT/ML injection Inject 10 Units into the skin 3 (three) times daily with meals for 30 days. 07/17/18 08/16/18  Orson Eva, MD  lisinopril (PRINIVIL,ZESTRIL) 2.5 MG tablet Take 1 tablet (2.5 mg total) by mouth daily for 30 days. Patient taking differently: Take 5 mg by mouth daily.  05/05/18 07/12/18  Murlean Iba, MD     Physical Exam: BP 120/73    Pulse 74    Temp 98 F (36.7 C) (Oral)    Resp 17    Ht _0  (1.753 m)    Wt 57.6 kg    SpO2 100%    BMI 18.75 kg/m   General: Lethargic, NAD Eyes: Normal ENT: Normal Neck: Supple Cardiovascular: S1, S2 present Respiratory: CTA B Abdomen: Soft, nontender, nondistended, bowel sounds present Skin: Normal Musculoskeletal: No pedal edema noted bilaterally Psychiatric: Normal mood Neurologic: No focal neurologic deficit noted          Labs on Admission:  Basic Metabolic Panel: Recent Labs  Lab 09/29/18 1233 09/29/18 1614  NA 122* 130*  K 5.6* 3.9  CL 84* 99  CO2 11* 14*  GLUCOSE 757* 351*  BUN 29* 25*  CREATININE 1.57* 1.25*  CALCIUM 9.0 8.1*   Liver Function Tests: No results for input(s): AST, ALT, ALKPHOS, BILITOT, PROT, ALBUMIN in the last 168 hours. No results for input(s): LIPASE, AMYLASE in the last 168 hours. No results for input(s): AMMONIA in the last 168 hours. CBC: Recent Labs  Lab 09/29/18 1233  WBC 7.6  HGB 14.9  HCT 44.8  MCV 97.2  PLT 212   Cardiac Enzymes: No results for input(s): CKTOTAL, CKMB, CKMBINDEX, TROPONINI in the last 168 hours.  BNP (last 3 results) No results for input(s): BNP in the last 8760 hours.  ProBNP (last 3 results) No results for input(s): PROBNP in the last 8760 hours.  CBG: Recent Labs  Lab 09/29/18 1214 09/29/18 1434 09/29/18 1549 09/29/18 1650  GLUCAP >600* >600* 512* 372*    Radiological Exams on Admission: Dg Chest Port 1 View  Result Date: 09/29/2018 CLINICAL DATA:  Hyperglycemia, generalized pain, nausea, diabetes mellitus EXAM: PORTABLE CHEST 1 VIEW COMPARISON:  Portable exam 1648 hours compared to 09/03/2018 FINDINGS: Normal heart size, mediastinal contours, and pulmonary vascularity. Lungs mildly hyperinflated but clear. No pulmonary infiltrate, pleural effusion or pneumothorax. Bones demineralized. IMPRESSION: No acute abnormalities. Electronically Signed   By: Lavonia Dana M.D.   On: 09/29/2018 17:12    EKG: Independently reviewed.  Normal sinus rhythm, no acute ST changes  Assessment/Plan Present on Admission:  DKA (diabetic ketoacidoses) (HCC)  MDD (major depressive disorder), recurrent severe, without psychosis ([place])  Tobacco abuse  Hyperthyroidism  Cocaine abuse ([place])  Principal Problem:   DKA (diabetic ketoacidoses) ([place]) Active Problems:   Hyperthyroidism   Diabetes mellitus ([place])   MDD (major depressive disorder), recurrent severe, without psychosis ([place])   Cocaine abuse ([place])   Tobacco abuse   DKA with uncontrolled diabetes mellitus Last A1c 06/06/2018 was 12.7, will repeat Very noncompliant, homeless VBG showed metabolic acidosis with anion gap BG 757 on presentation, UA with ketones, glucose greater than 500 Afebrile, no leukocytosis BG x2 pending Chest x-ray unremarkable Status post IV fluid bolus, insulin drip Continue maintenance IV fluids, insulin drip Frequent BMP, CBG checks, DKA protocol Monitor closely in SDU  Hyponatremia Likely due to above Frequent BMP checks  AKI Likely due to  above IV fluids Daily BMP  Hyperthyroidism TSH and free T4 pending  Hypertension Stable Hold home lisinopril due to AKI  GERD Continue PPI  Polysubstance/tobacco abuse Advised to quit Nicotine patch ordered    DVT prophylaxis: Heparin  Code Status: Full  Family Communication: None at bedside  Disposition Plan: To be determined  Consults called: None  Admission status: Inpatient    Alma Friendly MD [facility]   If 7PM-7AM, please contact night-coverage [email or web address]   09/29/2018, 7:13 PM

## 2018-09-29 NOTE — ED Notes (Addendum)
Date and time results received: 09/29/18 12:55 PM  Test: Glucose Critical Value: 64   Little, MD made aware and RN.

## 2018-09-29 NOTE — ED Notes (Signed)
Placed urinal @ bedside pt aware we need speicmen

## 2018-09-29 NOTE — ED Notes (Signed)
Vital signs stable. 

## 2018-09-29 NOTE — ED Provider Notes (Signed)
[facility] DEPT Provider Note   CSN: [id number] Arrival date & time: 09/29/18  1151    History   Chief Complaint Chief Complaint  Patient presents with  . Hyperglycemia    HPI YUE GLASHEEN is a 53 y.o. male.     The history is provided by the patient and medical records. No language interpreter was used.   CALBERT HULSEBUS is a 53 y.o. male  with a PMH as listed below including DM who presents to the Emergency Department complaining of hyperglycemia.  Associated symptoms include generalized pain and nausea.  Denies any emesis.  No fevers.  Denies cough, congestion or shortness of breath.  Reports that he should be taking NovoLog 12 units before meals as well as Lantus 16 units twice daily.  He is unsure of the last time he took his medications.  He tells me that some of his medications were in a friend's car and he could not get to them.  Thinks he might have taken some insulin yesterday, but that could have been the day before.   Past Medical History:  Diagnosis Date  . Arthritis   . Chronic generalized pain   . Diabetes mellitus   . DKA (diabetic ketoacidosis) ([place]) 07/2014  . Malnutrition ([place])   . Neuropathy   . Noncompliance with medication regimen   . Thyroid disease     Patient Active Problem List   Diagnosis Date Noted  . Hyperosmolar non-ketotic state in patient with type 2 diabetes mellitus ([place]) 06/06/2018  . Tobacco abuse 06/06/2018  . Malnutrition of moderate degree 10/19/2017  . Severe sepsis with septic shock (CODE) ([place]) 10/16/2017  . AKI (acute kidney injury) ([place]) 10/16/2017  . Esophagitis 10/16/2017  . Hypotension 06/16/2017  . Cocaine abuse ([place])   . Homeless   . Lactic acidosis   . DKA, type 2 ([place]) 06/22/2016  . Non compliance w medication regimen   . Protein-calorie malnutrition, severe 06/11/2016  . Chronic generalized pain 04/26/2016  . Neuropathy 04/26/2016  . Diabetes mellitus type 2, uncontrolled, with  complications ([place])   . MDD (major depressive disorder), recurrent severe, without psychosis ([place]) 03/27/2016  . Depression, recurrent ([place])   . Abdominal pain   . Hyperglycemia 02/24/2016  . Suicidal ideation 02/23/2016  . Volume depletion 12/21/2015  . Metabolic acidosis 86/76/1950  . Diabetes mellitus with nonketotic hyperosmolarity ([place]) 09/21/2014  . Noncompliance with medication regimen   . Diabetes mellitus ([place])   . DKA (diabetic ketoacidoses) ([place]) 07/24/2014  . Dehydration 07/24/2014  . Acute renal failure ([place]) 07/24/2014  . Hyperkalemia 07/24/2014  . Hyponatremia 07/24/2014  . Hyperthyroidism 07/24/2014    History reviewed. No pertinent surgical history.      Home Medications    Prior to Admission medications   Medication Sig Start Date End Date Taking? Authorizing Provider  blood glucose meter kit and supplies Dispense based on patient and insurance preference. Use up to four times daily as directed. (FOR ICD-10 E10.9, E11.9). 05/04/18  Yes Johnson, Clanford L, MD  gabapentin (NEURONTIN) 300 MG capsule Take 1 capsule (300 mg total) by mouth 4 (four) times daily. 07/17/18  Yes Tat, Shanon Brow, MD  insulin glargine (LANTUS) 100 UNIT/ML injection Inject 0.16 mLs (16 Units total) into the skin 2 (two) times daily. 07/17/18  Yes Tat, Shanon Brow, MD  INSULIN SYRINGE .5CC/29G 29G X 1/2" 0.5 ML MISC Use as directed 07/12/18  Yes Shah, Pratik D, DO  lisinopril (ZESTRIL) 5 MG tablet Take 5 mg  by mouth daily. 08/08/18  Yes [provider]  Multiple Vitamin (MULTIVITAMIN WITH MINERALS) TABS tablet Take 1 tablet by mouth daily. 06/08/18  Yes Barton Dubois, MD  oxyCODONE (OXY IR/ROXICODONE) 5 MG immediate release tablet Take 5 mg by mouth 3 (three) times daily.   Yes [provider]  pantoprazole (PROTONIX) 40 MG tablet Take 40 mg by mouth daily.   Yes [provider]  famotidine (PEPCID) 20 MG tablet Take 1 tablet (20 mg total) by mouth 2 (two) times daily. Patient  not taking: Reported on 09/29/2018 07/17/18   Orson Eva, MD  insulin aspart (NOVOLOG) 100 UNIT/ML injection Inject 10 Units into the skin 3 (three) times daily with meals for 30 days. 06/07/18 07/12/18  Barton Dubois, MD  insulin aspart (NOVOLOG) 100 UNIT/ML injection Inject 10 Units into the skin 3 (three) times daily with meals for 30 days. 07/17/18 08/16/18  Orson Eva, MD  lisinopril (PRINIVIL,ZESTRIL) 2.5 MG tablet Take 1 tablet (2.5 mg total) by mouth daily for 30 days. Patient taking differently: Take 5 mg by mouth daily.  05/05/18 07/12/18  Murlean Iba, MD    Family History Family History  Problem Relation Age of Onset  . Diabetes Mother   . Seizures Sister   . Diabetes Sister     Social History Social History   Tobacco Use  . Smoking status: Current Every Day Smoker    Packs/day: 0.50    Years: 8.00    Pack years: 4.00  . Smokeless tobacco: Never Used  Substance Use Topics  . Alcohol use: No  . Drug use: Yes    Types: Cocaine    Comment: states " I am not telling you when I did it, its not of your busy"     Allergies   Metformin and related and Nsaids   Review of Systems Review of Systems  Gastrointestinal: Positive for abdominal pain and nausea. Negative for constipation, diarrhea and vomiting.  Musculoskeletal: Positive for myalgias.  All other systems reviewed and are negative.    Physical Exam Updated Vital Signs BP 112/66 (BP Location: Right Arm)   Pulse (!) 101   Temp 97.9 F (36.6 C) (Oral)   Resp 18   SpO2 100%   Physical Exam Vitals signs and nursing note reviewed.  Constitutional:      General: He is not in acute distress.    Appearance: He is well-developed.  HENT:     Head: Normocephalic and atraumatic.  Neck:     Musculoskeletal: Neck supple.  Cardiovascular:     Rate and Rhythm: Normal rate and regular rhythm.     Heart sounds: Normal heart sounds. No murmur.  Pulmonary:     Effort: Pulmonary effort is normal. No respiratory  distress.     Breath sounds: Normal breath sounds.  Abdominal:     General: There is no distension.     Palpations: Abdomen is soft.     Comments: Generalized abdominal tenderness without focality.  Skin:    General: Skin is warm and dry.  Neurological:     Mental Status: He is alert and oriented to person, place, and time.      ED Treatments / Results  Labs (all labs ordered are listed, but only abnormal results are displayed) Labs Reviewed  BASIC METABOLIC PANEL - Abnormal; Notable for the following components:      Result Value   Sodium 122 (*)    Potassium 5.6 (*)    Chloride 84 (*)  CO2 11 (*)    Glucose, Bld 757 (*)    BUN 29 (*)    Creatinine, Ser 1.57 (*)    GFR calc non Af Amer 49 (*)    GFR calc Af Amer 57 (*)    Anion gap 27 (*)    All other components within normal limits  URINALYSIS, ROUTINE W REFLEX MICROSCOPIC - Abnormal; Notable for the following components:   Color, Urine STRAW (*)    Glucose, UA >=500 (*)    Ketones, ur 80 (*)    All other components within normal limits  BLOOD GAS, VENOUS - Abnormal; Notable for the following components:   pH, Ven 7.133 (*)    pCO2, Ven 28.7 (*)    pO2, Ven 56.9 (*)    Bicarbonate 9.2 (*)    Acid-base deficit 19.1 (*)    All other components within normal limits  PROTIME-INR - Abnormal; Notable for the following components:   Prothrombin Time 16.6 (*)    INR 1.4 (*)    All other components within normal limits  CBG MONITORING, ED - Abnormal; Notable for the following components:   Glucose-Capillary >600 (*)    All other components within normal limits  CBG MONITORING, ED - Abnormal; Notable for the following components:   Glucose-Capillary >600 (*)    All other components within normal limits  SARS CORONAVIRUS 2 (HOSPITAL ORDER, [facility])  CBC  RAPID URINE DRUG SCREEN, HOSP PERFORMED    EKG None  Radiology No results found.  Procedures .Critical Care Performed by:  , Ozella Almond, PA-C Authorized by: , Ozella Almond, PA-C   Critical care provider statement:    Critical care time (minutes):  35   Critical care was necessary to treat or prevent imminent or life-threatening deterioration of the following conditions:  Endocrine crisis and metabolic crisis   Critical care was time spent personally by me on the following activities:  Discussions with consultants, evaluation of patient's response to treatment, examination of patient, ordering and performing treatments and interventions, ordering and review of laboratory studies, ordering and review of radiographic studies, pulse oximetry, re-evaluation of patient's condition, obtaining history from patient or surrogate and review of old charts   (including critical care time)  Medications Ordered in ED Medications  dextrose 5 %-0.45 % sodium chloride infusion (has no administration in time range)  insulin regular, human (MYXREDLIN) 100 units/ 100 mL infusion (10.8 Units/hr Intravenous Rate/Dose Change 09/29/18 1435)  lactated ringers bolus 1,000 mL (has no administration in time range)  lactated ringers bolus 1,000 mL (1,000 mLs Intravenous New Bag/Given 09/29/18 1323)  metoCLOPramide (REGLAN) injection 10 mg (10 mg Intravenous Given 09/29/18 1245)     Initial Impression / Assessment and Plan / ED Course  I have reviewed the triage vital signs and the nursing notes.  Pertinent labs & imaging results that were available during my care of the patient were reviewed by me and considered in my medical decision making (see chart for details).       GREYDON BETKE is a 53 y.o. male who presents to ED for hyperglycemia, nausea, generalized pain. Feels similar to when he has had DKA in the past. Endorses non-compliance with medications. Denies infectious symptoms. Lungs clear. UA without signs of infection. Labs confirming DKA with AG of 27, co2 of 11, glucose of 757. VBG with ph of 7.133. Started on IVF and  insulin gtt. Hospitalist consulted who will admit.   Patient seen by  and discussed with Dr. Rex Kras who agrees with treatment plan.   Final Clinical Impressions(s) / ED Diagnoses   Final diagnoses:  Diabetic ketoacidosis without coma associated with type 2 diabetes mellitus [facility])    ED Discharge Orders    None       Depaul Arizpe, Ozella Almond, PA-C 09/29/18 1443    Little, Wenda Overland, MD 10/08/18 2036

## 2018-09-29 NOTE — Progress Notes (Signed)
Pt's Covid test results came back positive, made on call NP aware. Orders given to transport the pt to [facility] at this time. Pt alert and resting. I will continue to monitor.

## 2018-09-29 NOTE — ED Triage Notes (Signed)
Per EMS-states pain all over-CBG 487-noncompliant with meds-"maybe took insulin yesterday"

## 2018-09-29 NOTE — ED Notes (Signed)
ED TO INPATIENT HANDOFF REPORT  Name/Age/Gender Kenneth Rainwaterandy Kenneth [name] 53 y.o. male  Code Status Code Status History    Date Active Date Inactive Code Status Order ID Comments User Context   07/12/2018 1442 07/17/2018 1559 Full Code [id number]  Cleora FleetJohnson, Clanford L, MD ED   07/11/2018 2027 07/12/2018 1036 Full Code [id number]  Onnie BoerEmokpae, Ejiroghene E, MD Inpatient   06/06/2018 1752 06/07/2018 1711 Full Code [id number]  Vassie LollMadera, Carlos, MD Inpatient   05/02/2018 2343 05/04/2018 1659 Full Code [id number]  Onnie BoerEmokpae, Ejiroghene E, MD Inpatient   10/16/2017 1933 10/21/2017 1554 Full Code [id number]  Levie HeritageStinson, Jacob J, DO ED   06/16/2017 1605 06/24/2017 1633 Full Code [id number]  Haydee Monicaavid, Rachal A, MD ED   06/16/2017 0717 06/16/2017 1605 Full Code [id number]  Devoria AlbeKnapp, Iva, MD ED   11/29/2016 0639 11/30/2016 1858 Full Code [id number]  Pearson GrippeKim, James, MD ED   06/22/2016 1753 06/25/2016 1715 Full Code [id number]  Catarina Hartshornat, David, MD Inpatient   06/10/2016 1431 06/12/2016 2043 Full Code [id number]  Filbert SchilderKadolph, Alexandria U, MD Inpatient   03/26/2016 1928 04/04/2016 2114 Full Code [id number]  Truman HaywardStarkes, Takia S, FNP Inpatient   03/26/2016 1928 03/26/2016 1928 Full Code [id number]  Truman HaywardStarkes, Takia S, FNP Inpatient   03/25/2016 1440 03/26/2016 1903 Full Code [id number]  Tilden Fossaees, Elizabeth, MD ED   02/23/2016 1304 03/03/2016 2202 Full Code [id number]  Narda BondsNettey, Ralph A, MD Inpatient   12/21/2015 2315 12/23/2015 1423 Full Code [id number]  Delano MetzSchertz, Robert, MD Inpatient   12/16/2014 2335 12/17/2014 2106 Full Code [id number]  Meredeth IdeLama, Gagan S, MD Inpatient   09/21/2014 2201 09/22/2014 2152 Full Code [id number]  Haydee Monicaavid, Rachal A, MD Inpatient   07/24/2014 1149 07/27/2014 2052 Full Code [id number]  Erick BlinksMemon, Jehanzeb, MD Inpatient   Advance Care Planning Activity      Home/SNF/Other Home  Chief Complaint All over Pain  Level of Care/Admitting Diagnosis ED Disposition    ED Disposition Condition Comment   Admit  [facility] [[id number]]  Level of Care: Stepdown [14]  Admit  to SDU based on following criteria: Severe physiological/psychological symptoms:  Any diagnosis requiring assessment & intervention at least every 4 hours on an ongoing basis to obtain desired patient outcomes including stability and rehabilitation  Covid Evaluation: Confirmed COVID Negative  Diagnosis: DKA (diabetic ketoacidoses) [facility](HCC) [[id number]]  Admitting Physician: Briant CedarEZENDUKA, NKEIRUKA J [[id number]]  Attending Physician: Briant CedarEZENDUKA, NKEIRUKA J [[id number]]  Estimated length of stay: past midnight tomorrow  Certification:: I certify this patient will need inpatient services for at least 2 midnights  PT Class (Do Not Modify): Inpatient [101]  PT Acc Code (Do Not Modify): Private [1]       Medical History Past Medical History:  Diagnosis Date  . Arthritis   . Chronic generalized pain   . Diabetes mellitus   . DKA (diabetic ketoacidosis) (HCC) 07/2014  . Malnutrition (HCC)   . Neuropathy   . Noncompliance with medication regimen   . Thyroid disease     Allergies Allergies  Allergen Reactions  . Metformin And Related Other (See Comments)    Abdominal cramps and diarrhea  . Nsaids Nausea Only    IV Location/Drains/Wounds Patient Lines/Drains/Airways Status   Active Line/Drains/Airways    Name:   Placement date:   Placement time:   Site:   Days:   Peripheral IV 09/29/18 Right Forearm   09/29/18    1220    Forearm   less than 1   Peripheral IV 09/29/18 Left Antecubital   09/29/18  1323    Antecubital   less than 1          Labs/Imaging Results for orders placed or performed during the hospital encounter of 09/29/18 (from the past 48 hour(s))  CBG monitoring, ED     Status: Abnormal   Collection Time: 09/29/18 12:14 PM  Result Value Ref Range   Glucose-Capillary >600 (HH) 70 - 99 mg/dL  Basic metabolic panel     Status: Abnormal   Collection Time: 09/29/18 12:33 PM  Result Value Ref Range   Sodium 122 (L) 135 - 145 mmol/L   Potassium 5.6 (H) 3.5 - 5.1 mmol/L   Chloride 84 (L)  98 - 111 mmol/L   CO2 11 (L) 22 - 32 mmol/L   Glucose, Bld 757 (HH) 70 - 99 mg/dL    Comment: CRITICAL RESULT CALLED TO, READ BACK BY AND VERIFIED WITH: L.VAUGHN RN AT 1256 ON 09/29/2018 BY S.VANHOORNE    BUN 29 (H) 6 - 20 mg/dL   Creatinine, Ser 1.611.57 (H) 0.61 - 1.24 mg/dL   Calcium 9.0 8.9 - 09.610.3 mg/dL   GFR calc non Af Amer 49 (L) >60 mL/min   GFR calc Af Amer 57 (L) >60 mL/min   Anion gap 27 (H) 5 - 15    Comment: Performed at [facility], [address] [place], [place] [address]  CBC     Status: None   Collection Time: 09/29/18 12:33 PM  Result Value Ref Range   WBC 7.6 4.0 - 10.5 K/uL   RBC 4.61 4.22 - 5.81 MIL/uL   Hemoglobin 14.9 13.0 - 17.0 g/dL   HCT 98.144.8 19.139.0 - 47.852.0 %   MCV 97.2 80.0 - 100.0 fL   MCH 32.3 26.0 - 34.0 pg   MCHC 33.3 30.0 - 36.0 g/dL   RDW 29.512.4 62.111.5 - 30.815.5 %   Platelets 212 150 - 400 K/uL   nRBC 0.0 0.0 - 0.2 %    Comment: Performed at [facility], [address] [place], [place] [address]  Blood gas, venous (at [facility], not at [facility])     Status: Abnormal   Collection Time: 09/29/18 12:50 PM  Result Value Ref Range   FIO2 21.00    Delivery systems ROOM AIR    pH, Ven 7.133 (LL) 7.250 - 7.430    Comment: RBV RACHEL MORGAN, MD AT 1300 BY KENNAN DEPUE,RRT,RCP ON 09/29/2018   pCO2, Ven 28.7 (L) 44.0 - 60.0 mmHg   pO2, Ven 56.9 (H) 32.0 - 45.0 mmHg   Bicarbonate 9.2 (L) 20.0 - 28.0 mmol/L   Acid-base deficit 19.1 (H) 0.0 - 2.0 mmol/L   O2 Saturation 80.8 %   Patient temperature 98.6    Collection site VEIN    Drawn by RN    Sample type VEIN     Comment: Performed at [facility], [address] [place], [place] [address]  Urinalysis, Routine w reflex microscopic     Status: Abnormal   Collection Time: 09/29/18  1:17 PM  Result Value Ref Range   Color, Urine STRAW (A) YELLOW   APPearance CLEAR CLEAR   Specific Gravity, Urine 1.023 1.005 - 1.030   pH 5.0 5.0 - 8.0   Glucose, UA >=500  (A) NEGATIVE mg/dL   Hgb urine dipstick NEGATIVE NEGATIVE   Bilirubin Urine NEGATIVE NEGATIVE   Ketones, ur 80 (A) NEGATIVE mg/dL   Protein, ur NEGATIVE NEGATIVE mg/dL   Nitrite NEGATIVE NEGATIVE   Leukocytes,Ua  NEGATIVE NEGATIVE   RBC / HPF 0-5 0 - 5 RBC/hpf   WBC, UA 0-5 0 - 5 WBC/hpf   Bacteria, UA NONE SEEN NONE SEEN   Squamous Epithelial / LPF 0-5 0 - 5   Mucus PRESENT     Comment: Performed at [facility], [address] [place], [place] [address]  Urine rapid drug screen (hosp performed)     Status: Abnormal   Collection Time: 09/29/18  1:17 PM  Result Value Ref Range   Opiates NONE DETECTED NONE DETECTED   Cocaine POSITIVE (A) NONE DETECTED   Benzodiazepines NONE DETECTED NONE DETECTED   Amphetamines NONE DETECTED NONE DETECTED   Tetrahydrocannabinol NONE DETECTED NONE DETECTED   Barbiturates NONE DETECTED NONE DETECTED    Comment: (NOTE) DRUG SCREEN FOR MEDICAL PURPOSES ONLY.  IF CONFIRMATION IS NEEDED FOR ANY PURPOSE, NOTIFY LAB WITHIN 5 DAYS. LOWEST DETECTABLE LIMITS FOR URINE DRUG SCREEN Drug Class                     Cutoff (ng/mL) Amphetamine and metabolites    1000 Barbiturate and metabolites    200 Benzodiazepine                 200 Tricyclics and metabolites     300 Opiates and metabolites        300 Cocaine and metabolites        300 THC                            50 Performed at [facility], [address] [place], [place] [address]   Protime-INR     Status: Abnormal   Collection Time: 09/29/18  1:39 PM  Result Value Ref Range   Prothrombin Time 16.6 (H) 11.4 - 15.2 seconds   INR 1.4 (H) 0.8 - 1.2    Comment: (NOTE) INR goal varies based on device and disease states. Performed at [facility], [address] [place], [place] [address]   CBG monitoring, ED     Status: Abnormal   Collection Time: 09/29/18  2:34 PM  Result Value Ref Range   Glucose-Capillary >600 (HH) 70 - 99 mg/dL  CBG  monitoring, ED     Status: Abnormal   Collection Time: 09/29/18  3:49 PM  Result Value Ref Range   Glucose-Capillary 512 (HH) 70 - 99 mg/dL  CBG monitoring, ED     Status: Abnormal   Collection Time: 09/29/18  4:50 PM  Result Value Ref Range   Glucose-Capillary 372 (H) 70 - 99 mg/dL   No results found.  Pending Labs Unresulted Labs (From admission, onward)    Start     Ordered   09/29/18 1614  Basic metabolic panel  Once,   STAT     09/29/18 1613   09/29/18 1613  Culture, blood (routine x 2)  BLOOD CULTURE X 2,   R (with STAT occurrences)     09/29/18 1612   09/29/18 1311  SARS Coronavirus 2 (CEPHEID - Performed in [facility] hospital lab), Hosp Order  (Asymptomatic Patients Labs)  Once,   STAT    Question:  Rule Out  Answer:  Yes   09/29/18 1310   Signed and Held  Basic metabolic panel  STAT Now then every 4 hours ,   STAT     Signed and Held          Vitals/Pain  Today's Vitals   09/29/18 1630 09/29/18 1645 09/29/18 1656 09/29/18 1700  BP: 123/76 124/75 124/75 124/73  Pulse: 94 86 96 83  Resp: 18 (!) 21 17 18   Temp:      TempSrc:      SpO2: 100% 100% 100% 100%  PainSc:        Isolation Precautions No active isolations  Medications Medications  dextrose 5 %-0.45 % sodium chloride infusion (has no administration in time range)  insulin regular, human (MYXREDLIN) 100 units/ 100 mL infusion (10.8 Units/hr Intravenous Rate/Dose Change 09/29/18 1435)  0.9 %  sodium chloride infusion (has no administration in time range)  lactated ringers bolus 1,000 mL (0 mLs Intravenous Stopped 09/29/18 1552)  metoCLOPramide (REGLAN) injection 10 mg (10 mg Intravenous Given 09/29/18 1245)  lactated ringers bolus 1,000 mL (1,000 mLs Intravenous New Bag/Given 09/29/18 1554)    Mobility walks

## 2018-09-30 DIAGNOSIS — Z9114 Patient's other noncompliance with medication regimen: Secondary | ICD-10-CM

## 2018-09-30 DIAGNOSIS — F332 Major depressive disorder, recurrent severe without psychotic features: Secondary | ICD-10-CM

## 2018-09-30 DIAGNOSIS — E1010 Type 1 diabetes mellitus with ketoacidosis without coma: Secondary | ICD-10-CM

## 2018-09-30 LAB — GLUCOSE, CAPILLARY
Glucose-Capillary: 108 mg/dL — ABNORMAL HIGH (ref 70–99)
Glucose-Capillary: 141 mg/dL — ABNORMAL HIGH (ref 70–99)
Glucose-Capillary: 251 mg/dL — ABNORMAL HIGH (ref 70–99)
Glucose-Capillary: 279 mg/dL — ABNORMAL HIGH (ref 70–99)
Glucose-Capillary: 290 mg/dL — ABNORMAL HIGH (ref 70–99)
Glucose-Capillary: 359 mg/dL — ABNORMAL HIGH (ref 70–99)
Glucose-Capillary: 95 mg/dL (ref 70–99)

## 2018-09-30 LAB — T4, FREE: Free T4: 0.96 ng/dL (ref 0.61–1.12)

## 2018-09-30 LAB — BASIC METABOLIC PANEL
Anion gap: 14 (ref 5–15)
BUN: 19 mg/dL (ref 6–20)
CO2: 18 mmol/L — ABNORMAL LOW (ref 22–32)
Calcium: 7.9 mg/dL — ABNORMAL LOW (ref 8.9–10.3)
Chloride: 97 mmol/L — ABNORMAL LOW (ref 98–111)
Creatinine, Ser: 0.96 mg/dL (ref 0.61–1.24)
GFR calc Af Amer: >60 mL/min (ref >60)
GFR calc non Af Amer: >60 mL/min (ref >60)
Glucose, Bld: 293 mg/dL — ABNORMAL HIGH (ref 70–99)
Potassium: 3.8 mmol/L (ref 3.5–5.1)
Sodium: 129 mmol/L — ABNORMAL LOW (ref 135–145)

## 2018-09-30 LAB — TSH: TSH: 0.894 u[IU]/mL (ref 0.350–4.500)

## 2018-09-30 LAB — MRSA PCR SCREENING: MRSA by PCR: NEGATIVE

## 2018-09-30 MED ORDER — GABAPENTIN 100 MG PO CAPS
100.0000 mg | ORAL_CAPSULE | Freq: Three times a day (TID) | ORAL | Status: DC
  Administered 2018-09-30 – 2018-10-05 (×14): 100 mg via ORAL
  Filled 2018-09-30 (×14): qty 1

## 2018-09-30 MED ORDER — GABAPENTIN 100 MG PO CAPS: 100.0000 mg | ORAL_CAPSULE | Freq: Four times a day (QID) | ORAL | Status: DC

## 2018-09-30 MED ORDER — INSULIN ASPART 100 UNIT/ML ~~LOC~~ SOLN: 10.0000 [IU] | Freq: Three times a day (TID) | SUBCUTANEOUS | 0 refills | Status: DC

## 2018-09-30 MED ORDER — OXYCODONE HCL 5 MG PO TABS
5.0000 mg | ORAL_TABLET | Freq: Three times a day (TID) | ORAL | Status: DC | PRN
  Administered 2018-09-30 – 2018-10-01 (×2): 5 mg via ORAL
  Filled 2018-09-30 (×2): qty 1

## 2018-09-30 MED ORDER — INSULIN GLARGINE 100 UNIT/ML ~~LOC~~ SOLN: 16.0000 [IU] | Freq: Two times a day (BID) | SUBCUTANEOUS | 0 refills | Status: DC

## 2018-09-30 NOTE — Plan of Care (Signed)

## 2018-09-30 NOTE — Progress Notes (Signed)
Pt recived via [facility] from [facility] ED at 0617  Pt alert and oreinted x4 vital signs stable Pt was on an insulin drip while at [facility] but arrived with no insulin infusing

## 2018-09-30 NOTE — Progress Notes (Signed)
Report given to [facility] RN who will resume care at [facility].

## 2018-09-30 NOTE — Progress Notes (Signed)
Dr. Curly Rim because patient complained of generalized pain and is asking for his PTA medication of oxy IR 5mg .  See new orders per MD.

## 2018-09-30 NOTE — Progress Notes (Signed)
With permission from the Pt, a call was made to Randal Buba at [phone] who is the pt's sister to update her about, the Pt being transferred to [facility]. At this time, I was unable to reach [name].I will continue to monitor.

## 2018-09-30 NOTE — Care Management (Addendum)
Case manager acknowledges consult. Will continue to follow. Social worker notified for Kenneth [name] possibly at discharge. Patient is homeless.

## 2018-09-30 NOTE — Discharge Summary (Addendum)
Kenneth [name] ZHG:[id number] DOB: [birth year] DOA: 09/29/2018   Patient coming from: Homeless & is able to ambulate  Chief Complaint: Not feeling well  HPI: Kenneth [name] is a 53 y.o. male with medical history significant for uncontrolled diabetes mellitus, tobacco abuse, polysubstance abuse, hypertension, GERD presented to the ED complaining of generalized pain, nausea, lethargy for the past couple of days.  Patient also noted significant polyuria and polydipsia in the past few days.  Patient denies any significant abdominal pain, vomiting, chest pain, cough, runny nose, sore throat, denies any fever/chills.  Of note, patient is homeless, and does not remember the last time he took his medications including his insulin. On presentation, vital signs noted for some tachycardia, otherwise unremarkable.  Labs notable for BG 757, VBG showed metabolic acidosis, with anion gap, hyponatremia, mild hyperkalemia, AKI, UA showed greater than 500 glucose with ketones, UDS positive for cocaine.  Chest x-ray unremarkable.  Patient noted to be in DKA, received IV fluid boluses, insulin drip started in the ED. [facility] hospitalist consulted to admit patient for further management.  Patient admitted as above with market hyperglycemia, noncompliant with insulin for upwards of a week per discussion, patient's anion gap is now closed, DKA resolving, tolerating p.o. well at this point patient otherwise medically stable and agreeable for discharge.  Unfortunately patient is COVID positive and given his homeless status disposition will be difficult, case management involved to assist in this endeavor.  Patient otherwise medically stable and agreeable for discharge.  Review of Systems: Review of systems are otherwise negative   Past Medical History:  Diagnosis Date  . Arthritis   . Chronic generalized pain   . Diabetes mellitus   . DKA (diabetic ketoacidosis) ([place]) 07/2014  . Malnutrition ([place])   . Neuropathy   .  Noncompliance with medication regimen   . Thyroid disease    History reviewed. No pertinent surgical history.  Social History:  reports that he has been smoking. He has a 4.00 pack-year smoking history. He has never used smokeless tobacco. He reports current drug use. Drug: Cocaine. He reports that he does not drink alcohol.   Allergies  Allergen Reactions  . Metformin And Related Other (See Comments)    Abdominal cramps and diarrhea  . Nsaids Nausea Only    Family History  Problem Relation Age of Onset  . Diabetes Mother   . Seizures Sister   . Diabetes Sister     Allergies as of 09/30/2018      Reactions   Metformin And Related Other (See Comments)   Abdominal cramps and diarrhea   Nsaids Nausea Only      Medication List    STOP taking these medications   blood glucose meter kit and supplies   famotidine 20 MG tablet Commonly known as: PEPCID   oxyCODONE 5 MG immediate release tablet Commonly known as: Oxy IR/ROXICODONE     TAKE these medications   gabapentin 300 MG capsule Commonly known as: NEURONTIN Take 1 capsule (300 mg total) by mouth 4 (four) times daily.   insulin aspart 100 UNIT/ML injection Commonly known as: NovoLOG Inject 10 Units into the skin 3 (three) times daily with meals for 30 days.   insulin glargine 100 UNIT/ML injection Commonly known as: LANTUS Inject 0.16 mLs (16 Units total) into the skin 2 (two) times daily.   INSULIN SYRINGE .5CC/29G 29G X 1/2" 0.5 ML Misc Use as directed   lisinopril 2.5 MG tablet Commonly known as: Zestril Take  1 tablet (2.5 mg total) by mouth daily for 30 days. What changed:   how much to take  Another medication with the same name was removed. Continue taking this medication, and follow the directions you see here.   multivitamin with minerals Tabs tablet Take 1 tablet by mouth daily.   pantoprazole 40 MG tablet Commonly known as: PROTONIX Take 40 mg by mouth daily.        Physical Exam: BP  95/66 (BP Location: Right Arm)   Pulse 70   Temp 98.4 F (36.9 C) (Oral)   Resp 17   Ht '5\' 9"'$  (1.753 m)   Wt 57.6 kg   SpO2 99%   BMI 18.75 kg/m   General:  Pleasantly resting in bed, No acute distress. HEENT:  Normocephalic atraumatic.  Sclerae nonicteric, noninjected.  Extraocular movements intact bilaterally. Neck:  Without mass or deformity.  Trachea is midline. Lungs:  Clear to auscultate bilaterally without rhonchi, wheeze, or rales. Heart:  Regular rate and rhythm.  Without murmurs, rubs, or gallops. Abdomen:  Soft, nontender, nondistended.  Without guarding or rebound. Extremities: Without cyanosis, clubbing, edema, or obvious deformity. Vascular:  Dorsalis pedis and posterior tibial pulses palpable bilaterally. Skin:  Warm and dry, no erythema, no ulcerations.          Labs on Admission:  Basic Metabolic Panel: Recent Labs  Lab 09/29/18 1233 09/29/18 1614 09/29/18 1827 09/29/18 2223 09/30/18 0139  NA 122* 130* 131* 131* 129*  K 5.6* 3.9 4.0 3.7 3.8  CL 84* 99 100 100 97*  CO2 11* 14* 16* 20* 18*  GLUCOSE 757* 351* 235* 121* 293*  BUN 29* 25* 23* 19 19  CREATININE 1.57* 1.25* 1.07 0.86 0.96  CALCIUM 9.0 8.1* 8.4* 8.1* 7.9*   Liver Function Tests: No results for input(s): AST, ALT, ALKPHOS, BILITOT, PROT, ALBUMIN in the last 168 hours. No results for input(s): LIPASE, AMYLASE in the last 168 hours. No results for input(s): AMMONIA in the last 168 hours. CBC: Recent Labs  Lab 09/29/18 1233  WBC 7.6  HGB 14.9  HCT 44.8  MCV 97.2  PLT 212   Cardiac Enzymes: No results for input(s): CKTOTAL, CKMB, CKMBINDEX, TROPONINI in the last 168 hours.  BNP (last 3 results) No results for input(s): BNP in the last 8760 hours.  ProBNP (last 3 results) No results for input(s): PROBNP in the last 8760 hours.  CBG: Recent Labs  Lab 09/29/18 2320 09/30/18 0010 09/30/18 0409 09/30/18 0747 09/30/18 1137  GLUCAP 108* 141* 359* 95 290*    Radiological Exams on  Admission: Dg Chest Port 1 View  Result Date: 09/29/2018 CLINICAL DATA:  Hyperglycemia, generalized pain, nausea, diabetes mellitus EXAM: PORTABLE CHEST 1 VIEW COMPARISON:  Portable exam 1648 hours compared to 09/03/2018 FINDINGS: Normal heart size, mediastinal contours, and pulmonary vascularity. Lungs mildly hyperinflated but clear. No pulmonary infiltrate, pleural effusion or pneumothorax. Bones demineralized. IMPRESSION: No acute abnormalities. Electronically Signed   By: Lavonia Dana M.D.   On: 09/29/2018 17:12    EKG: Independently reviewed.  Normal sinus rhythm, no acute ST changes  Assessment/Plan Present on Admission: . DKA (diabetic ketoacidoses) ([place]) . MDD (major depressive disorder), recurrent severe, without psychosis ([place]) . Tobacco abuse . Hyperthyroidism . Cocaine abuse ([place])  Principal Problem:   DKA (diabetic ketoacidoses) ([place]) Active Problems:   Hyperthyroidism   Diabetes mellitus ([place])   MDD (major depressive disorder), recurrent severe, without psychosis ([place])   Cocaine abuse ([place])   Tobacco abuse   DKA  with uncontrolled diabetes mellitus, POA, resolved Last A1c 06/06/2018 was 12.7 Marked medical noncompliance VBG showed metabolic acidosis with anion gap BG 757 on presentation, UA with ketones, glucose greater than 500 Afebrile, no leukocytosis Gap closed, patient not tolerating p.o., symptoms resolved  Pseudo-hyponatremia Secondary to above  AKI, resolved Secondary to above Now tolerating p.o.  Hyperthyroidism TSH and free T4 within normal limits  Hypertension Stable Hold home lisinopril due to AKI  GERD Continue PPI  Polysubstance/tobacco abuse Advised to quit Nicotine patch ordered  DVT prophylaxis: Heparin  Code Status: Full  Family Communication: None at bedside  Disposition Plan: Patient resolved much more drastically than expected, DKA resolved, nausea vomiting resolved, patient is not hypoxic, medically stable for discharge,  disposition pending safe location for discharge given, with positive status, unclear if patient is able to return to homeless shelter  Consults called: None  Little Ishikawa MD [facility]   If 7PM-7AM, please contact night-coverage [email or web address]   09/30/2018, 12:17 PM

## 2018-10-01 LAB — GLUCOSE, CAPILLARY
Glucose-Capillary: 188 mg/dL — ABNORMAL HIGH (ref 70–99)
Glucose-Capillary: 141 mg/dL — ABNORMAL HIGH (ref 70–99)
Glucose-Capillary: 392 mg/dL — ABNORMAL HIGH (ref 70–99)
Glucose-Capillary: 342 mg/dL — ABNORMAL HIGH (ref 70–99)
Glucose-Capillary: 369 mg/dL — ABNORMAL HIGH (ref 70–99)

## 2018-10-01 MED ORDER — ACETAMINOPHEN 325 MG PO TABS: 650.0000 mg | ORAL_TABLET | Freq: Four times a day (QID) | ORAL | Status: DC | PRN

## 2018-10-01 MED ORDER — ALUM & MAG HYDROXIDE-SIMETH 200-200-20 MG/5ML PO SUSP
15.0000 mL | Freq: Four times a day (QID) | ORAL | Status: DC | PRN
  Administered 2018-10-01 – 2018-10-05 (×4): 15 mL via ORAL
  Filled 2018-10-01 (×4): qty 30

## 2018-10-01 NOTE — TOC Initial Note (Signed)
Transition of Care [facility]) - Initial/Assessment Note    Patient Details  Name: Kenneth [name] MRN: [id number] Date of Birth: 11/20/1965  Transition of Care [facility]) CM/SW Contact:    Karolynn Infantino, Francetta Found, LCSW Phone Number: 10/01/2018, 10:21 AM  Clinical Narrative:                  CSW spoke with patient over the phone to offer support and discuss patient needs at discharge.  Patient states that he had been staying at [facility] and was being asked to leave.  Patient had been gone roughly 4 days, staying on the street and with a friend prior to coming to the hospital not feeling well.  Patient states that he has been approved for [place] housing but has yet to find an apartment.  Patient is agreeable with hotel option and confirms that he is independent with ADL's and agreeable with terms for the hotel.    CSW has contacted on call MD for Health Department regarding potential hotel placement.  Patient is unable to obtain insulin from [place] over the weekend, therefore patient unable to discharge until Monday - MD and patient aware.  CSW remains available for support and to facilitate patient discharge needs.  Expected Discharge Plan: Home/Self Care(COVID Hotel) Barriers to Discharge: Homeless with medical needs, Other (comment)(Patient unable to receive insulin over the weekend to be medically appropriate for discharge to [place])   Patient Goals and CMS Choice     Choice offered to / list presented to : NA  Expected Discharge Plan and Services Expected Discharge Plan: Home/Self Care(COVID Hotel) In-house Referral: Clinical Social Work Discharge Planning Services: Medication Assistance, [place] Acute Care Choice: NA Living arrangements for the past 2 months: Homeless, [place] Expected Discharge Date: 09/30/18                         [place] Arranged: NA          Prior Living Arrangements/Services Living arrangements for the past 2 months: Homeless,  Canadian with:: Self, Facility Resident Patient language and need for interpreter reviewed:: Yes Do you feel safe going back to the place where you live?: No   Patient was living at [facility] and many people have [place] - patient was also couch hopping but no longer available  Need for Family Participation in Patient Care: No (Comment) Care giver support system in place?: No (comment)   Criminal Activity/Legal Involvement Pertinent to Current Situation/Hospitalization: No - Comment as needed  Activities of Daily Living Home Assistive Devices/Equipment: Other (Comment)(glucometer) ADL Screening (condition at time of admission) Patient's cognitive ability adequate to safely complete daily activities?: Yes Is the patient deaf or have difficulty hearing?: No Does the patient have difficulty seeing, even when wearing glasses/contacts?: No Does the patient have difficulty concentrating, remembering, or making decisions?: Yes Patient able to express need for assistance with ADLs?: Yes Does the patient have difficulty dressing or bathing?: No Independently performs ADLs?: Yes (appropriate for developmental age) Does the patient have difficulty walking or climbing stairs?: No Weakness of Legs: None Weakness of Arms/Hands: None  Permission Sought/Granted Permission sought to share information with : Case Manager, Chartered certified accountant granted to share information with : Yes, Verbal Permission Granted     Permission granted to share info w AGENCY: [facility] Department        Emotional Assessment Appearance:: Appears older than stated age Attitude/Demeanor/Rapport:  Engaged Affect (typically observed): Accepting, Calm, Frustrated Orientation: : Oriented to Self, Oriented to Place, Oriented to  Time, Oriented to Situation Alcohol / Substance Use: Illicit Drugs, Alcohol Use Psych Involvement: No (comment)  Admission diagnosis:  DKA  (diabetic ketoacidoses) (HCC) [E11.10] Diabetic ketoacidosis without coma associated with type 2 diabetes mellitus (HCC) [E11.10] Patient Active Problem List   Diagnosis Date Noted  . Hyperosmolar non-ketotic state in patient with type 2 diabetes mellitus (HCC) 06/06/2018  . Tobacco abuse 06/06/2018  . Malnutrition of moderate degree 10/19/2017  . Severe sepsis with septic shock (CODE) (HCC) 10/16/2017  . AKI (acute kidney injury) (HCC) 10/16/2017  . Esophagitis 10/16/2017  . Hypotension 06/16/2017  . Cocaine abuse (HCC)   . Homeless   . Lactic acidosis   . DKA, type 2 (HCC) 06/22/2016  . Non compliance w medication regimen   . Protein-calorie malnutrition, severe 06/11/2016  . Chronic generalized pain 04/26/2016  . Neuropathy 04/26/2016  . Diabetes mellitus type 2, uncontrolled, with complications (HCC)   . 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 Depression, recurrent (HCC)   . Abdominal pain   . Hyperglycemia 02/24/2016  . Suicidal ideation 02/23/2016  . Volume depletion 12/21/2015  . Metabolic acidosis 12/21/2015  . Diabetes mellitus with nonketotic hyperosmolarity (HCC) 09/21/2014  . Noncompliance with medication regimen   . Diabetes mellitus (HCC)   . DKA (diabetic ketoacidoses) (HCC) 07/24/2014  . Dehydration 07/24/2014  . Acute renal failure (HCC) 07/24/2014  . Hyperkalemia 07/24/2014  . Hyponatremia 07/24/2014  . Hyperthyroidism 07/24/2014   PCP:  Oval Linseyondiego, [name], MD Pharmacy:   [facility] - [place], [place] - [address] [place] [address] Phone: [phone] Fax: [phone]  [facility] - [place], [place] - [address] [place] [address] Phone: [phone] Fax: [phone]  [facility] [place] [place] [address] Phone: [phone] Fax: [phone]     Social Determinants of  Health (SDOH) Interventions    Readmission Risk Interventions Readmission Risk Prevention Plan 07/17/2018 07/13/2018 07/12/2018  Transportation Screening Complete Complete Complete  PCP or Specialist Appt within 3-5 Days Complete Not Complete Not Complete  Not Complete comments - - pt argumentative and being escorted out by police  HRI or Home Care Consult Complete Complete Complete  Social Work Consult for Recovery Care Planning/Counseling Complete Complete -  Palliative Care Screening Not Applicable Not Applicable Not Applicable  Medication Review Oceanographer(RN Care Manager) Complete Complete Complete  Some recent data might be hidden

## 2018-10-01 NOTE — Discharge Summary (Signed)
Kenneth RainwaterRandy C [name] [id number] DOB: 02/15/1966 DOA: 09/29/2018   Patient coming from: Homeless  Chief Complaint: Not feeling well  HPI: Kenneth RainwaterRandy C Humphries is a 53 y.o. male with medical history significant for uncontrolled diabetes mellitus, tobacco abuse, polysubstance abuse, hypertension, GERD presented to the ED complaining of generalized pain, nausea, lethargy for the past couple of days.  Patient also noted significant polyuria and polydipsia in the past few days.  Patient denies any significant abdominal pain, vomiting, chest pain, cough, runny nose, sore throat, denies any fever/chills.  Of note, patient is homeless, and does not remember the last time he took his medications including his insulin. On presentation, vital signs noted for some tachycardia, otherwise unremarkable.  Labs notable for BG 757, VBG showed metabolic acidosis, with anion gap, hyponatremia, mild hyperkalemia, AKI, UA showed greater than 500 glucose with ketones, UDS positive for cocaine.  Chest x-ray unremarkable.  Patient noted to be in DKA, received IV fluid boluses, insulin drip started in the ED. [facility] hospitalist consulted to admit patient for further management.  Patient admitted as above with marked hyperglycemia, noncompliant with insulin for upwards of a week per discussion, patient's anion gap is now closed, DKA resolving, tolerating p.o. well at this point patient otherwise medically stable and agreeable for discharge.  Unfortunately patient is COVID positive and given his homeless status disposition will be difficult, case management involved to assist in this endeavor.  Patient otherwise medically stable and agreeable for discharge.  Patient remains in the hospital 2/2 social/pharmacy issues - unable to obtain insulin pen until Monday 7/13 and is homeless - social/case working on placement. Pharmacy working on obtaining insulin pen for patient.  Review of Systems: Review of systems are otherwise negative    Past Medical History:  Diagnosis Date  . Arthritis   . Chronic generalized pain   . Diabetes mellitus   . DKA (diabetic ketoacidosis) (HCC) 07/2014  . Malnutrition (HCC)   . Neuropathy   . Noncompliance with medication regimen   . Thyroid disease    History reviewed. No pertinent surgical history.  Social History:  reports that he has been smoking. He has a 4.00 pack-year smoking history. He has never used smokeless tobacco. He reports current drug use. Drug: Cocaine. He reports that he does not drink alcohol.   Allergies  Allergen Reactions  . Metformin And Related Other (See Comments)    Abdominal cramps and diarrhea  . Nsaids Nausea Only    Family History  Problem Relation Age of Onset  . Diabetes Mother   . Seizures Sister   . Diabetes Sister      Physical Exam: BP 124/80   Pulse 82   Temp 98.1 F (36.7 C)   Resp 18   Ht 5\' 9"  (1.753 m)   Wt 57.6 kg   SpO2 100%   BMI 18.75 kg/m   General:  Pleasantly resting in bed, No acute distress. HEENT:  Normocephalic atraumatic.  Sclerae nonicteric, noninjected.  Extraocular movements intact bilaterally. Neck:  Without mass or deformity.  Trachea is midline. Lungs:  Clear to auscultate bilaterally without rhonchi, wheeze, or rales. Heart:  Regular rate and rhythm.  Without murmurs, rubs, or gallops. Abdomen:  Soft, nontender, nondistended.  Without guarding or rebound. Extremities: Without cyanosis, clubbing, edema, or obvious deformity. Vascular:  Dorsalis pedis and posterior tibial pulses palpable bilaterally. Skin:  Warm and dry, no erythema, no ulcerations.          Labs on Admission:  Basic  Metabolic Panel: Recent Labs  Lab 09/29/18 1233 09/29/18 1614 09/29/18 1827 09/29/18 2223 09/30/18 0139  NA 122* 130* 131* 131* 129*  K 5.6* 3.9 4.0 3.7 3.8  CL 84* 99 100 100 97*  CO2 11* 14* 16* 20* 18*  GLUCOSE 757* 351* 235* 121* 293*  BUN 29* 25* 23* 19 19  CREATININE 1.57* 1.25* 1.07 0.86 0.96  CALCIUM 9.0  8.1* 8.4* 8.1* 7.9*   Liver Function Tests: No results for input(s): AST, ALT, ALKPHOS, BILITOT, PROT, ALBUMIN in the last 168 hours. No results for input(s): LIPASE, AMYLASE in the last 168 hours. No results for input(s): AMMONIA in the last 168 hours. CBC: Recent Labs  Lab 09/29/18 1233  WBC 7.6  HGB 14.9  HCT 44.8  MCV 97.2  PLT 212   Cardiac Enzymes: No results for input(s): CKTOTAL, CKMB, CKMBINDEX, TROPONINI in the last 168 hours.  BNP (last 3 results) No results for input(s): BNP in the last 8760 hours.  ProBNP (last 3 results) No results for input(s): PROBNP in the last 8760 hours.  CBG: Recent Labs  Lab 09/30/18 0747 09/30/18 1137 09/30/18 1621 09/30/18 2010 10/01/18 0356  GLUCAP 95 290* 279* 251* 188*    Radiological Exams on Admission: Dg Chest Port 1 View  Result Date: 09/29/2018 CLINICAL DATA:  Hyperglycemia, generalized pain, nausea, diabetes mellitus EXAM: PORTABLE CHEST 1 VIEW COMPARISON:  Portable exam 1648 hours compared to 09/03/2018 FINDINGS: Normal heart size, mediastinal contours, and pulmonary vascularity. Lungs mildly hyperinflated but clear. No pulmonary infiltrate, pleural effusion or pneumothorax. Bones demineralized. IMPRESSION: No acute abnormalities. Electronically Signed   By: Lavonia Dana M.D.   On: 09/29/2018 17:12    EKG: Independently reviewed.  Normal sinus rhythm, no acute ST changes  Assessment/Plan Present on Admission: . DKA (diabetic ketoacidoses) ([place]) . MDD (major depressive disorder), recurrent severe, without psychosis ([place]) . Tobacco abuse . Hyperthyroidism . Cocaine abuse (HCC)  Principal Problem:   DKA (diabetic ketoacidoses) ([place]) Active Problems:   Hyperthyroidism   Diabetes mellitus ([place])   MDD (major depressive disorder), recurrent severe, without psychosis ([place])   Cocaine abuse ([place])   Tobacco abuse   DKA with uncontrolled diabetes mellitus, POA, resolved Last A1c 06/06/2018 was 12.7 Marked medical  noncompliance VBG showed metabolic acidosis with anion gap BG 757 on presentation, UA with ketones, glucose greater than 500 Afebrile, no leukocytosis Gap closed, patient not tolerating p.o., symptoms resolved  Pseudo-hyponatremia Secondary to above  AKI, resolved Secondary to above Now tolerating p.o.  Hyperthyroidism TSH and free T4 within normal limits  Hypertension Stable Hold home lisinopril due to AKI  GERD Continue PPI  Polysubstance/tobacco abuse Advised to quit Nicotine patch ordered  DVT prophylaxis: Heparin  Code Status: Full  Family Communication: None at bedside  Disposition Plan: Patient resolved much more drastically than expected, DKA resolved, nausea vomiting resolved, patient is not hypoxic, medically stable for discharge, disposition pending safe location for discharge given, with positive status, unclear if patient is able to return to homeless shelter/hotel  Consults called: None  Little Ishikawa MD [facility]   If 7PM-7AM, please contact night-coverage [email or web address]   10/01/2018, 7:39 AM

## 2018-10-01 NOTE — Plan of Care (Signed)

## 2018-10-02 LAB — GLUCOSE, CAPILLARY
Glucose-Capillary: 114 mg/dL — ABNORMAL HIGH (ref 70–99)
Glucose-Capillary: 124 mg/dL — ABNORMAL HIGH (ref 70–99)
Glucose-Capillary: 133 mg/dL — ABNORMAL HIGH (ref 70–99)
Glucose-Capillary: 138 mg/dL — ABNORMAL HIGH (ref 70–99)
Glucose-Capillary: 150 mg/dL — ABNORMAL HIGH (ref 70–99)
Glucose-Capillary: 175 mg/dL — ABNORMAL HIGH (ref 70–99)
Glucose-Capillary: 191 mg/dL — ABNORMAL HIGH (ref 70–99)
Glucose-Capillary: 267 mg/dL — ABNORMAL HIGH (ref 70–99)
Glucose-Capillary: 287 mg/dL — ABNORMAL HIGH (ref 70–99)
Glucose-Capillary: 304 mg/dL — ABNORMAL HIGH (ref 70–99)
Glucose-Capillary: 432 mg/dL — ABNORMAL HIGH (ref 70–99)
Glucose-Capillary: 62 mg/dL — ABNORMAL LOW (ref 70–99)
Glucose-Capillary: 70 mg/dL (ref 70–99)
Glucose-Capillary: 93 mg/dL (ref 70–99)

## 2018-10-02 MED ORDER — PANTOPRAZOLE SODIUM 40 MG PO TBEC: 40.0000 mg | DELAYED_RELEASE_TABLET | Freq: Every day | ORAL | 0 refills | Status: AC

## 2018-10-02 MED ORDER — SENNOSIDES-DOCUSATE SODIUM 8.6-50 MG PO TABS
1.0000 | ORAL_TABLET | Freq: Two times a day (BID) | ORAL | Status: DC | PRN
  Administered 2018-10-02 – 2018-10-05 (×4): 1 via ORAL
  Filled 2018-10-02 (×4): qty 1

## 2018-10-02 MED ORDER — GABAPENTIN 300 MG PO CAPS: 300.0000 mg | ORAL_CAPSULE | Freq: Four times a day (QID) | ORAL | 0 refills | Status: AC

## 2018-10-02 MED ORDER — INSULIN ASPART 100 UNIT/ML ~~LOC~~ SOLN: 10.0000 [IU] | Freq: Three times a day (TID) | SUBCUTANEOUS | 0 refills | Status: DC

## 2018-10-02 MED ORDER — "INSULIN SYRINGE 29G X 1/2"" 0.5 ML MISC": 0 refills | Status: AC

## 2018-10-02 MED ORDER — LISINOPRIL 2.5 MG PO TABS: 2.5000 mg | ORAL_TABLET | Freq: Every day | ORAL | 0 refills | Status: AC

## 2018-10-02 MED ORDER — INSULIN GLARGINE 100 UNIT/ML ~~LOC~~ SOLN: 10.0000 [IU] | Freq: Every day | SUBCUTANEOUS | 0 refills | Status: DC

## 2018-10-02 MED FILL — ULTICARE INS 0.5 ML 30GX1/2: 30G X 1/2" | 30 days supply | Qty: 100 | Fill #0

## 2018-10-02 MED FILL — LISINOPRIL 2.5 MG TABLET: 2.5 | 30 days supply | Qty: 30 | Fill #0

## 2018-10-02 MED FILL — PANTOPRAZOLE SOD DR 40 MG T: 40 | 30 days supply | Qty: 30 | Fill #0

## 2018-10-02 MED FILL — FREESTYLE LITE METER: 1 days supply | Qty: 1 | Fill #0

## 2018-10-02 MED FILL — FREESTYLE LITE TEST STRIP: 25 days supply | Qty: 100 | Fill #0

## 2018-10-02 MED FILL — FREESTYLE LANCETS: 30 days supply | Qty: 100 | Fill #0

## 2018-10-02 MED FILL — LANTUS 100 UNITS/ML VIAL: 100 | 30 days supply | Qty: 10 | Fill #0

## 2018-10-02 MED FILL — NovoLOG 100 UNIT/ML SOLN: 100 | 30 days supply | Qty: 10 | Fill #0

## 2018-10-02 NOTE — Discharge Instructions (Signed)
Blood Glucose Monitoring, Adult °Monitoring your blood sugar (glucose) is an important part of managing your diabetes (diabetes mellitus). Blood glucose monitoring involves checking your blood glucose as often as directed and keeping a record (log) of your results over time. °Checking your blood glucose regularly and keeping a blood glucose log can: °· Help you and your health care provider adjust your diabetes management plan as needed, including your medicines or insulin. °· Help you understand how food, exercise, illnesses, and medicines affect your blood glucose. °· Let you know what your blood glucose is at any time. You can quickly find out if you have low blood glucose (hypoglycemia) or high blood glucose (hyperglycemia). °Your health care provider will set individualized treatment goals for you. Your goals will be based on your age, other medical conditions you have, and how you respond to diabetes treatment. Generally, the goal of treatment is to maintain the following blood glucose levels: °· Before meals (preprandial): 80-130 mg/dL (4.4-7.2 mmol/L). °· After meals (postprandial): below 180 mg/dL (10 mmol/L). °· A1c level: less than 7%. °Supplies needed: °· Blood glucose meter. °· Test strips for your meter. Each meter has its own strips. You must use the strips that came with your meter. °· A needle to prick your finger (lancet). Do not use a lancet more than one time. °· A device that holds the lancet (lancing device). °· A journal or log book to write down your results. °How to check your blood glucose ° °1. Wash your hands with soap and water. °2. Prick the side of your finger (not the tip) with the lancet. Use a different finger each time. °3. Gently rub the finger until a small drop of blood appears. °4. Follow instructions that come with your meter for inserting the test strip, applying blood to the strip, and using your blood glucose meter. °5. Write down your result and any notes. °Some meters  allow you to use areas of your body other than your finger (alternative sites) to test your blood. The most common alternative sites are: °· Forearm. °· Thigh. °· Palm of the hand. °If you think you may have hypoglycemia, or if you have a history of not knowing when your blood glucose is getting low (hypoglycemia unawareness), do not use alternative sites. Use your finger instead. Alternative sites may not be as accurate as the fingers, because blood flow is slower in these areas. This means that the result you get may be delayed, and it may be different from the result that you would get from your finger. °Follow these instructions at home: °Blood glucose log ° °· Every time you check your blood glucose, write down your result. Also write down any notes about things that may be affecting your blood glucose, such as your diet and exercise for the day. This information can help you and your health care provider: °? Look for patterns in your blood glucose over time. °? Adjust your diabetes management plan as needed. °· Check if your meter allows you to download your records to a computer. Most glucose meters store a record of glucose readings in the meter. °If you have type 1 diabetes: °· Check your blood glucose 2 or more times a day. °· Also check your blood glucose: °? Before every insulin injection. °? Before and after exercise. °? Before meals. °? 2 hours after a meal. °? Occasionally between 2:00 a.m. and 3:00 a.m., as directed. °? Before potentially dangerous tasks, like driving or using heavy machinery. °?   At bedtime. °· You may need to check your blood glucose more often, up to 6-10 times a day, if you: °? Use an insulin pump. °? Need multiple daily injections (MDI). °? Have diabetes that is not well-controlled. °? Are ill. °? Have a history of severe hypoglycemia. °? Have hypoglycemia unawareness. °If you have type 2 diabetes: °· If you take insulin or other diabetes medicines, check your blood glucose 2 or  more times a day. °· If you are on intensive insulin therapy, check your blood glucose 4 or more times a day. Occasionally, you may also need to check between 2:00 a.m. and 3:00 a.m., as directed. °· Also check your blood glucose: °? Before and after exercise. °? Before potentially dangerous tasks, like driving or using heavy machinery. °· You may need to check your blood glucose more often if: °? Your medicine is being adjusted. °? Your diabetes is not well-controlled. °? You are ill. °General tips °· Always keep your supplies with you. °· If you have questions or need help, all blood glucose meters have a 24-hour "hotline" phone number that you can call. You may also contact your health care provider. °· After you use a few boxes of test strips, adjust (calibrate) your blood glucose meter by following instructions that came with your meter. °Contact a health care provider if: °· Your blood glucose is at or above 240 mg/dL (13.3 mmol/L) for 2 days in a row. °· You have been sick or have had a fever for 2 days or longer, and you are not getting better. °· You have any of the following problems for more than 6 hours: °? You cannot eat or drink. °? You have nausea or vomiting. °? You have diarrhea. °Get help right away if: °· Your blood glucose is lower than 54 mg/dL (3 mmol/L). °· You become confused or you have trouble thinking clearly. °· You have difficulty breathing. °· You have moderate or large ketone levels in your urine. °Summary °· Monitoring your blood sugar (glucose) is an important part of managing your diabetes (diabetes mellitus). °· Blood glucose monitoring involves checking your blood glucose as often as directed and keeping a record (log) of your results over time. °· Your health care provider will set individualized treatment goals for you. Your goals will be based on your age, other medical conditions you have, and how you respond to diabetes treatment. °· Every time you check your blood glucose,  write down your result. Also write down any notes about things that may be affecting your blood glucose, such as your diet and exercise for the day. °This information is not intended to replace advice given to you by your health care provider. Make sure you discuss any questions you have with your health care provider. °Document Released: 03/11/2003 Document Revised: 12/30/2017 Document Reviewed: 08/18/2015 °Elsevier Patient Education © 2020 [employer] Inc[employer] ° °

## 2018-10-02 NOTE — TOC Transition Note (Signed)
Transition of Care [facility]) - CM/SW Discharge Note   Patient Details  Name: RASHID WHITENIGHT MRN: [id number] Date of Birth: 1965/10/17  [facility]) CM/SW Contact:  Midge Minium RN, BSN, NCM-BC, ACM-RN [phone] (working remotely) Phone Number: 10/02/2018, 11:14 AM   Clinical Narrative:    CM spoke to the patient via phone to discuss the POC and barriers to discharge. Patient states he had been staying at [facility], but had to be quarantined d/t still being COVID +. Patient verified having Medicaid, (3) BG/testing supplies and all of his Rx, but they are located at [facility] and he doesn't have access until he is COVID (-). Patient will need Rx for ALL of his prescriptions to ensure medication adherence. CM working with [place], to ensure his Rx are filled and will utilize the [facility] courier to transport his Rx to [facility]. Working with [place] to obtain a BG/testing supplies before discharge.    Final next level of care: Other (comment)(Hotel) Barriers to Discharge: No Barriers Identified   Patient Goals and CMS Choice Patient states their goals for this hospitalization and ongoing recovery are:: "to get better..I never recovered"   Choice offered to / list presented to : NA   Discharge Plan and Services In-house Referral: Clinical Social Work Discharge Planning Services: Medication Assistance, CM Consult Post Acute Care Choice: NA          DME Arranged: N/A DME Agency: NA       HH Arranged: NA HH Agency: NA        Social Determinants of Health (SDOH) Interventions     Readmission Risk Interventions Readmission Risk Prevention Plan 07/17/2018 07/13/2018 07/12/2018  Transportation Screening Complete Complete Complete  PCP or Specialist Appt within 3-5 Days Complete Not Complete Not Complete  Not Complete comments - - pt argumentative and being escorted out by police  [place] or [place] Complete Complete Complete  Social  Work Consult for [place] Planning/Counseling Complete Complete -  Palliative Care Screening Not Applicable Not Applicable Not Applicable  Medication Review Press photographer) Complete Complete Complete  Some recent data might be hidden

## 2018-10-02 NOTE — TOC Progression Note (Signed)
Transition of Care [facility]) - Progression Note    Patient Details  Name: Kenneth [name] MRN: [id number] Date of Birth: 1965-04-16  Transition of Care [facility]) CM/SW [place], LCSW Phone Number: 10/02/2018, 2:28 PM  Clinical Narrative:     Patient to discharge to with hotel voucher provided by [facility] health department. Requirement from the health department included that patient has shelter to return to at the end of hotel stay confirmed and in place- at this time [facility] in [facility] has declined to accept patient back. CSW has reached out to [facility] in [place] but has not heard back. Patient will not be able to dischargen to hotel until shelter can be put in place.    Expected Discharge Plan: Home/Self Care(COVID Hotel) Barriers to Discharge: No Barriers Identified  Expected Discharge Plan and Services Expected Discharge Plan: Home/Self Care(COVID Hotel) In-house Referral: Clinical Social Work Discharge Planning Services: Medication Assistance, CM Consult Post Acute Care Choice: NA Living arrangements for the past 2 months: Homeless, [place] Expected Discharge Date: 10/02/18               DME Arranged: N/A DME Agency: NA       HH Arranged: NA HH Agency: NA         Social Determinants of Health (SDOH) Interventions    Readmission Risk Interventions Readmission Risk Prevention Plan 10/02/2018 07/17/2018 07/13/2018  Transportation Screening Complete Complete Complete  PCP or Specialist Appt within 3-5 Days - Complete Not Complete  Not Complete comments - - -  HRI or [place] - Complete Complete  Social Work Consult for [place] Planning/Counseling - Complete Complete  Palliative Care Screening - Not Applicable Not Applicable  Medication Review Press photographer) Referral to Pharmacy Complete Complete  PCP or Specialist appointment within 3-5 days of discharge Complete - -  [place] or Home Care Consult Complete - -  [place]  Care/Counseling Consult Complete - -  Palliative Care Screening Not Applicable - -  [place] Not Applicable - -  Some recent data might be hidden

## 2018-10-02 NOTE — Discharge Summary (Signed)
CAMRIN GEARHEART WLN:[id number] DOB: 1965-04-04 DOA: 09/29/2018   Patient coming from: Homeless  Chief Complaint: Not feeling well  HPI: Kenneth [name] is a 53 y.o. male with medical history significant for uncontrolled diabetes mellitus, tobacco abuse, polysubstance abuse, hypertension, GERD presented to the ED complaining of generalized pain, nausea, lethargy for the past couple of days.  Patient also noted significant polyuria and polydipsia in the past few days.  Patient denies any significant abdominal pain, vomiting, chest pain, cough, runny nose, sore throat, denies any fever/chills.  Of note, patient is homeless, and does not remember the last time he took his medications including his insulin. On presentation, vital signs noted for some tachycardia, otherwise unremarkable.  Labs notable for BG 757, VBG showed metabolic acidosis, with anion gap, hyponatremia, mild hyperkalemia, AKI, UA showed greater than 500 glucose with ketones, UDS positive for cocaine.  Chest x-ray unremarkable.  Patient noted to be in DKA, received IV fluid boluses, insulin drip started in the ED. [facility] hospitalist consulted to admit patient for further management.  Patient admitted as above with marked hyperglycemia, noncompliant with insulin for upwards of a week per discussion, patient's anion gap is now closed, DKA resolving, tolerating p.o. well at this point patient otherwise medically stable and agreeable for discharge.  Unfortunately patient is COVID positive and given his homeless status disposition remains difficult.  Patient to discharge today 10/02/18 now that [employer] is open - pending hotel availability w/ Case/Social and health department.  Review of Systems: Review of systems are otherwise negative   Past Medical History:  Diagnosis Date  . Arthritis   . Chronic generalized pain   . Diabetes mellitus   . DKA (diabetic ketoacidosis) ([place]) 07/2014  . Malnutrition ([place])   . Neuropathy   .  Noncompliance with medication regimen   . Thyroid disease    History reviewed. No pertinent surgical history.  Social History:  reports that he has been smoking. He has a 4.00 pack-year smoking history. He has never used smokeless tobacco. He reports current drug use. Drug: Cocaine. He reports that he does not drink alcohol.   Allergies  Allergen Reactions  . Metformin And Related Other (See Comments)    Abdominal cramps and diarrhea  . Nsaids Nausea Only    Family History  Problem Relation Age of Onset  . Diabetes Mother   . Seizures Sister   . Diabetes Sister      Physical Exam: BP 104/81   Pulse 72   Temp 97.8 F (36.6 C) (Oral)   Resp 20   Ht 5\' 9"  (1.753 m)   Wt 57.6 kg   SpO2 100%   BMI 18.75 kg/m   General:  Pleasantly resting in bed, No acute distress. HEENT:  Normocephalic atraumatic.  Sclerae nonicteric, noninjected.  Extraocular movements intact bilaterally. Neck:  Without mass or deformity.  Trachea is midline. Lungs:  Clear to auscultate bilaterally without rhonchi, wheeze, or rales. Heart:  Regular rate and rhythm.  Without murmurs, rubs, or gallops. Abdomen:  Soft, nontender, nondistended.  Without guarding or rebound. Extremities: Without cyanosis, clubbing, edema, or obvious deformity. Vascular:  Dorsalis pedis and posterior tibial pulses palpable bilaterally. Skin:  Warm and dry, no erythema, no ulcerations.          Labs on Admission:  Basic Metabolic Panel: Recent Labs  Lab 09/29/18 1233 09/29/18 1614 09/29/18 1827 09/29/18 2223 09/30/18 0139  NA 122* 130* 131* 131* 129*  K 5.6* 3.9 4.0 3.7 3.8  CL 84* 99 100 100 97*  CO2 11* 14* 16* 20* 18*  GLUCOSE 757* 351* 235* 121* 293*  BUN 29* 25* 23* 19 19  CREATININE 1.57* 1.25* 1.07 0.86 0.96  CALCIUM 9.0 8.1* 8.4* 8.1* 7.9*   Liver Function Tests: No results for input(s): AST, ALT, ALKPHOS, BILITOT, PROT, ALBUMIN in the last 168 hours. No results for input(s): LIPASE, AMYLASE in the last  168 hours. No results for input(s): AMMONIA in the last 168 hours. CBC: Recent Labs  Lab 09/29/18 1233  WBC 7.6  HGB 14.9  HCT 44.8  MCV 97.2  PLT 212   Cardiac Enzymes: No results for input(s): CKTOTAL, CKMB, CKMBINDEX, TROPONINI in the last 168 hours.  BNP (last 3 results) No results for input(s): BNP in the last 8760 hours.  ProBNP (last 3 results) No results for input(s): PROBNP in the last 8760 hours.  CBG: Recent Labs  Lab 10/02/18 0042 10/02/18 0132 10/02/18 0234 10/02/18 0417 10/02/18 0535  GLUCAP 93 114* 138* 124* 150*    Radiological Exams on Admission: No results found.  EKG: Independently reviewed.  Normal sinus rhythm, no acute ST changes  Assessment/Plan Present on Admission: . DKA (diabetic ketoacidoses) (HCC) . MDD (major depressive disorder), recurrent severe, without psychosis (HCC) . Tobacco abuse . Hyperthyroidism . Cocaine abuse (HCC)  Principal Problem:   DKA (diabetic ketoacidoses) (HCC) Active Problems:   Hyperthyroidism   Diabetes mellitus (HCC)   MDD (major depressive disorder), recurrent severe, without psychosis (HCC)   Cocaine abuse (HCC)   Tobacco abuse   DKA with uncontrolled diabetes mellitus, POA, resolved Last A1c 06/06/2018 was 12.7 Marked medical noncompliance VBG showed metabolic acidosis with anion gap BG 757 on presentation, UA with ketones, glucose greater than 500 Afebrile, no leukocytosis Gap closed, patient not tolerating p.o., symptoms resolved on lantus 10u HS and sliding scale with improved diet  Pseudo-hyponatremia, resolved Secondary to above  AKI, resolved Secondary to above Now tolerating p.o.  Hyperthyroidism TSH and free T4 within normal limits  Hypertension Stable Hold home lisinopril due to AKI  GERD Continue PPI  Polysubstance/tobacco abuse Advised to quit Nicotine patch ordered  DVT prophylaxis: Heparin  Code Status: Full  Family Communication: None at bedside  Disposition  Plan: Patient resolved much more drastically than expected, DKA resolved, nausea vomiting resolved, patient is not hypoxic. Discharge as above to hotel once local confirmed.   Consults called: None  Azucena FallenWilliam C Mana Morison MD [facility]  If 7PM-7AM, please contact night-coverage [email or web address]  10/02/2018, 7:25 AM

## 2018-10-02 NOTE — Progress Notes (Signed)
MATCH ( Medication Assistance Through [facility]) completed for Diabetic Strips not covered by Medicaid; meds will be given to patient by [facility] pharmacy. Mindi Slicker [facility] [phone]

## 2018-10-03 LAB — GLUCOSE, CAPILLARY
Glucose-Capillary: 139 mg/dL — ABNORMAL HIGH (ref 70–99)
Glucose-Capillary: 176 mg/dL — ABNORMAL HIGH (ref 70–99)
Glucose-Capillary: 188 mg/dL — ABNORMAL HIGH (ref 70–99)
Glucose-Capillary: 253 mg/dL — ABNORMAL HIGH (ref 70–99)
Glucose-Capillary: 267 mg/dL — ABNORMAL HIGH (ref 70–99)
Glucose-Capillary: 278 mg/dL — ABNORMAL HIGH (ref 70–99)

## 2018-10-03 NOTE — Discharge Summary (Signed)
Kenneth RainwaterRandy C Suhr [id number] DOB: 10/21/1965 DOA: 09/29/2018   Patient coming from: Homeless  Chief Complaint: Not feeling well  HPI: Kenneth RainwaterRandy C [name] is a 53 y.o. male with medical history significant for uncontrolled diabetes mellitus, tobacco abuse, polysubstance abuse, hypertension, GERD presented to the ED complaining of generalized pain, nausea, lethargy for the past couple of days.  Patient also noted significant polyuria and polydipsia in the past few days.  Patient denies any significant abdominal pain, vomiting, chest pain, cough, runny nose, sore throat, denies any fever/chills.  Of note, patient is homeless, and does not remember the last time he took his medications including his insulin. On presentation, vital signs noted for some tachycardia, otherwise unremarkable.  Labs notable for BG 757, VBG showed metabolic acidosis, with anion gap, hyponatremia, mild hyperkalemia, AKI, UA showed greater than 500 glucose with ketones, UDS positive for cocaine.  Chest x-ray unremarkable.  Patient noted to be in DKA, received IV fluid boluses, insulin drip started in the ED. [facility] hospitalist consulted to admit patient for further management.  Patient admitted as above with marked hyperglycemia, noncompliant with insulin for upwards of a week per discussion, patient's anion gap is now closed, DKA resolving, tolerating p.o. well at this point patient otherwise medically stable and agreeable for discharge.  Unfortunately patient is COVID positive and given his homeless status disposition remains difficult.  Patient remains discharged - pending hotel/disoposition availability w/ Case/Social and health department assistance.  Review of Systems: Review of systems are otherwise negative  Past Medical History:  Diagnosis Date  . Arthritis   . Chronic generalized pain   . Diabetes mellitus   . DKA (diabetic ketoacidosis) (HCC) 07/2014  . Malnutrition (HCC)   . Neuropathy   . Noncompliance with  medication regimen   . Thyroid disease    History reviewed. No pertinent surgical history.  Social History:  reports that he has been smoking. He has a 4.00 pack-year smoking history. He has never used smokeless tobacco. He reports current drug use. Drug: Cocaine. He reports that he does not drink alcohol.   Allergies  Allergen Reactions  . Metformin And Related Other (See Comments)    Abdominal cramps and diarrhea  . Nsaids Nausea Only    Family History  Problem Relation Age of Onset  . Diabetes Mother   . Seizures Sister   . Diabetes Sister      Physical Exam: BP 115/73 (BP Location: Right Arm)   Pulse 71   Temp 98.7 F (37.1 C) (Oral)   Resp 20   Ht 5\' 9"  (1.753 m)   Wt 57.6 kg   SpO2 99%   BMI 18.75 kg/m   General:  Pleasantly resting in bed, No acute distress. HEENT:  Normocephalic atraumatic. Neck:  Without mass or deformity.  Trachea is midline. Heart:  Regular rate and rhythm. Extremities: Without cyanosis, clubbing, edema, or obvious deformity.          Labs on Admission:  Basic Metabolic Panel: Recent Labs  Lab 09/29/18 1233 09/29/18 1614 09/29/18 1827 09/29/18 2223 09/30/18 0139  NA 122* 130* 131* 131* 129*  K 5.6* 3.9 4.0 3.7 3.8  CL 84* 99 100 100 97*  CO2 11* 14* 16* 20* 18*  GLUCOSE 757* 351* 235* 121* 293*  BUN 29* 25* 23* 19 19  CREATININE 1.57* 1.25* 1.07 0.86 0.96  CALCIUM 9.0 8.1* 8.4* 8.1* 7.9*   Liver Function Tests: No results for input(s): AST, ALT, ALKPHOS, BILITOT, PROT, ALBUMIN in the  last 168 hours. No results for input(s): LIPASE, AMYLASE in the last 168 hours. No results for input(s): AMMONIA in the last 168 hours. CBC: Recent Labs  Lab 09/29/18 1233  WBC 7.6  HGB 14.9  HCT 44.8  MCV 97.2  PLT 212   Cardiac Enzymes: No results for input(s): CKTOTAL, CKMB, CKMBINDEX, TROPONINI in the last 168 hours.  BNP (last 3 results) No results for input(s): BNP in the last 8760 hours.  ProBNP (last 3 results) No results  for input(s): PROBNP in the last 8760 hours.  CBG: Recent Labs  Lab 10/02/18 1625 10/02/18 2019 10/02/18 2239 10/03/18 0005 10/03/18 0531  GLUCAP 191* 267* 287* 176* 188*    Radiological Exams on Admission: No results found.  EKG: Independently reviewed.  Normal sinus rhythm, no acute ST changes  Assessment/Plan Present on Admission: . DKA (diabetic ketoacidoses) ([place]) . MDD (major depressive disorder), recurrent severe, without psychosis ([place]) . Tobacco abuse . Hyperthyroidism . Cocaine abuse (HCC)  Principal Problem:   DKA (diabetic ketoacidoses) (HCC) Active Problems:   Hyperthyroidism   Diabetes mellitus ([place])   MDD (major depressive disorder), recurrent severe, without psychosis ([place])   Cocaine abuse ([place])   Tobacco abuse   DKA with uncontrolled diabetes mellitus, POA, resolved Last A1c 06/06/2018 was 12.7 Marked medical noncompliance VBG showed metabolic acidosis with anion gap BG 757 on presentation, UA with ketones, glucose greater than 500 Afebrile, no leukocytosis Gap closed, patient not tolerating p.o., symptoms resolved on lantus 10u HS and sliding scale with improved diet  Pseudo-hyponatremia, resolved Secondary to above  AKI, resolved Secondary to above Now tolerating p.o.  Hyperthyroidism TSH and free T4 within normal limits  Hypertension Stable Hold home lisinopril due to AKI  GERD Continue PPI  Polysubstance/tobacco abuse Advised to quit Nicotine patch ordered  DVT prophylaxis: Heparin  Code Status: Full  Family Communication: None at bedside  Disposition Plan: Patient resolved much more drastically than expected, DKA resolved, nausea vomiting resolved, patient is not hypoxic. Discharge as above to hotel once location confirmed.   Consults called: None  Little Ishikawa MD [facility]  If 7PM-7AM, please contact night-coverage [email or web address]  10/03/2018, 7:18 AM

## 2018-10-03 NOTE — Progress Notes (Signed)
Pt became upset with me when he asked if he could have a soda & I told him he could have water or a diet soda. Pt states his BS =188 and whats a soda gonna do to my sugar. I explained how he had had several snacks & drinks during the night

## 2018-10-04 LAB — GLUCOSE, CAPILLARY
Glucose-Capillary: 180 mg/dL — ABNORMAL HIGH (ref 70–99)
Glucose-Capillary: 186 mg/dL — ABNORMAL HIGH (ref 70–99)
Glucose-Capillary: 241 mg/dL — ABNORMAL HIGH (ref 70–99)
Glucose-Capillary: 266 mg/dL — ABNORMAL HIGH (ref 70–99)
Glucose-Capillary: 273 mg/dL — ABNORMAL HIGH (ref 70–99)
Glucose-Capillary: 291 mg/dL — ABNORMAL HIGH (ref 70–99)
Glucose-Capillary: 391 mg/dL — ABNORMAL HIGH (ref 70–99)
Glucose-Capillary: 93 mg/dL (ref 70–99)

## 2018-10-04 LAB — CULTURE, BLOOD (ROUTINE X 2)
Culture: NO GROWTH
Culture: NO GROWTH
Special Requests: ADEQUATE
Special Requests: ADEQUATE

## 2018-10-04 MED ORDER — INSULIN GLARGINE 100 UNIT/ML ~~LOC~~ SOLN
15.0000 [IU] | Freq: Every day | SUBCUTANEOUS | Status: DC
  Administered 2018-10-04: 15 [IU] via SUBCUTANEOUS
  Filled 2018-10-04 (×2): qty 0.15

## 2018-10-04 NOTE — Progress Notes (Signed)
Inpatient Diabetes Program Recommendations  AACE/ADA: New Consensus Statement on Inpatient Glycemic Control (2015)  Target Ranges:  Prepandial:   less than 140 mg/dL      Peak postprandial:   less than 180 mg/dL (1-2 hours)      Critically ill patients:  140 - 180 mg/dL   Lab Results  Component Value Date   GLUCAP 186 (H) 10/04/2018   HGBA1C 10.0 (H) 09/29/2018    Review of Glycemic Control Results for ROLLAN, ROGER (MRN [id number]) as of 10/04/2018 11:16  Ref. Range 10/03/2018 23:48 10/04/2018 01:31 10/04/2018 03:51 10/04/2018 05:51 10/04/2018 07:45  Glucose-Capillary Latest Ref Range: 70 - 99 mg/dL 93 180 (H) 273 (H) 391 (H) 186 (H)   Diabetes history: DM2 Outpatient Diabetes medications: Novolog 10 TID + Lantus 16 BID  Current orders for Inpatient glycemic control: Lantus 10 units + Novolog moderate correction q 4 hrs.  Inpatient Diabetes Program Recommendations:   Noted patient is awaiting discharge. Has received total of 34 units of Novolog correction over the past 24 hrs. Since patient is now eating,  -Lantus 10 units bid -Novolog 5 units tid meal coverage if eats 50% -Decrease Novolog to tid + hs 0-5 units  Thank you, Bethena Roys E. Raina Sole, RN, MSN, CDE  Diabetes Coordinator Inpatient Glycemic Control Team Team Pager [phone] (8am-5pm) 10/04/2018 11:24 AM

## 2018-10-04 NOTE — Discharge Summary (Signed)
Kenneth RainwaterRandy C Karch [id number] DOB: 04/06/1965 DOA: 09/29/2018   Patient coming from: Homeless  Chief Complaint: Not feeling well  HPI: Kenneth RainwaterRandy C Mansel is a 53 y.o. male with medical history significant for uncontrolled diabetes mellitus, tobacco abuse, polysubstance abuse, hypertension, GERD presented to the ED complaining of generalized pain, nausea, lethargy for the past couple of days.  Patient also noted significant polyuria and polydipsia in the past few days.  Patient denies any significant abdominal pain, vomiting, chest pain, cough, runny nose, sore throat, denies any fever/chills.  Of note, patient is homeless, and does not remember the last time he took his medications including his insulin. On presentation, vital signs noted for some tachycardia, otherwise unremarkable.  Labs notable for BG 757, VBG showed metabolic acidosis, with anion gap, hyponatremia, mild hyperkalemia, AKI, UA showed greater than 500 glucose with ketones, UDS positive for cocaine.  Chest x-ray unremarkable.  Patient noted to be in DKA, received IV fluid boluses, insulin drip started in the ED. [facility] hospitalist consulted to admit patient for further management.  Patient admitted as above with marked hyperglycemia, noncompliant with insulin for upwards of a week per discussion, patient's anion gap is now closed, DKA resolving, tolerating p.o. well at this point patient otherwise medically stable and agreeable for discharge. Unfortunately patient is COVID positive and given his homeless status disposition remains difficult.  Patient remains medically stable and appropriate for discharge - pending hotel/disoposition availability w/ Case/Social and health department assistance.  Review of Systems: Review of systems are otherwise negative  Past Medical History:  Diagnosis Date  . Arthritis   . Chronic generalized pain   . Diabetes mellitus   . DKA (diabetic ketoacidosis) (HCC) 07/2014  . Malnutrition (HCC)   .  Neuropathy   . Noncompliance with medication regimen   . Thyroid disease    History reviewed. No pertinent surgical history.  Social History:  reports that he has been smoking. He has a 4.00 pack-year smoking history. He has never used smokeless tobacco. He reports current drug use. Drug: Cocaine. He reports that he does not drink alcohol.  Allergies  Allergen Reactions  . Metformin And Related Other (See Comments)    Abdominal cramps and diarrhea  . Nsaids Nausea Only   Family History  Problem Relation Age of Onset  . Diabetes Mother   . Seizures Sister   . Diabetes Sister    Physical Exam: BP 104/75 (BP Location: Right Arm)   Pulse 74   Temp 97.9 F (36.6 C) (Oral)   Resp 18   Ht 5\' 9"  (1.753 m)   Wt 57.6 kg   SpO2 99%   BMI 18.75 kg/m   General:  Pleasantly resting in bed, No acute distress. HEENT:  Normocephalic atraumatic. Neck:  Without mass or deformity.  Trachea is midline. Heart:  Regular rate and rhythm. Extremities: Without cyanosis, clubbing, edema, or obvious deformity.          Labs on Admission:  Basic Metabolic Panel: Recent Labs  Lab 09/29/18 1233 09/29/18 1614 09/29/18 1827 09/29/18 2223 09/30/18 0139  NA 122* 130* 131* 131* 129*  K 5.6* 3.9 4.0 3.7 3.8  CL 84* 99 100 100 97*  CO2 11* 14* 16* 20* 18*  GLUCOSE 757* 351* 235* 121* 293*  BUN 29* 25* 23* 19 19  CREATININE 1.57* 1.25* 1.07 0.86 0.96  CALCIUM 9.0 8.1* 8.4* 8.1* 7.9*   Liver Function Tests: No results for input(s): AST, ALT, ALKPHOS, BILITOT, PROT, ALBUMIN in the  last 168 hours. No results for input(s): LIPASE, AMYLASE in the last 168 hours. No results for input(s): AMMONIA in the last 168 hours. CBC: Recent Labs  Lab 09/29/18 1233  WBC 7.6  HGB 14.9  HCT 44.8  MCV 97.2  PLT 212   Cardiac Enzymes: No results for input(s): CKTOTAL, CKMB, CKMBINDEX, TROPONINI in the last 168 hours.  BNP (last 3 results) No results for input(s): BNP in the last 8760 hours.  ProBNP  (last 3 results) No results for input(s): PROBNP in the last 8760 hours.  CBG: Recent Labs  Lab 10/04/18 0131 10/04/18 0351 10/04/18 0551 10/04/18 0745 10/04/18 1132  GLUCAP 180* 273* 391* 186* 266*    Radiological Exams on Admission: No results found.  EKG: Independently reviewed.  Normal sinus rhythm, no acute ST changes  Assessment/Plan Present on Admission: . DKA (diabetic ketoacidoses) ([place]) . MDD (major depressive disorder), recurrent severe, without psychosis ([place]) . Tobacco abuse . Hyperthyroidism . Cocaine abuse ([place])  Principal Problem:   DKA (diabetic ketoacidoses) ([place]) Active Problems:   Hyperthyroidism   Diabetes mellitus ([place])   MDD (major depressive disorder), recurrent severe, without psychosis ([place])   Cocaine abuse ([place])   Tobacco abuse   DKA with uncontrolled diabetes mellitus, POA, resolved Last A1c 06/06/2018 was 12.7 Marked medical noncompliance VBG showed metabolic acidosis with anion gap BG 757 on presentation, UA with ketones, glucose greater than 500 Afebrile, no leukocytosis Gap closed, patient not tolerating p.o. symptoms resolved on lantus 10u HS and sliding scale with improved diet.  Pseudo-hyponatremia, resolved Secondary to above  AKI, resolved Secondary to above, Now tolerating p.o.  Hyperthyroidism TSH and free T4 within normal limits  Hypertension Stable, Hold home lisinopril due to AKI  GERD Continue PPI  Polysubstance/tobacco abuse Advised to quit, Nicotine patch ordered  DVT prophylaxis: Heparin  Code Status: Full  Family Communication: None at bedside  Disposition Plan: Patient resolved much more drastically than expected, DKA resolved, nausea vomiting resolved, patient is not hypoxic. Discharge as above to hotel once location confirmed.   Consults called: None  Little Ishikawa MD [facility]  If 7PM-7AM, please contact night-coverage [email or web address]  10/04/2018, 1:50 PM

## 2018-10-04 NOTE — Plan of Care (Signed)

## 2018-10-04 NOTE — Progress Notes (Signed)
CSW consulted with director Elana Alm regarding patient's discharge plan if able to go to hotel. He suggested CSW contact Daryel Gerald at [phone] to solidify a plan for when patient leaves hotel, which is what health dept is requiring. Legrand Como did not answer, CSW lvm.   [place], [place]

## 2018-10-05 LAB — GLUCOSE, CAPILLARY
Glucose-Capillary: 117 mg/dL — ABNORMAL HIGH (ref 70–99)
Glucose-Capillary: 198 mg/dL — ABNORMAL HIGH (ref 70–99)
Glucose-Capillary: 174 mg/dL — ABNORMAL HIGH (ref 70–99)
Glucose-Capillary: 243 mg/dL — ABNORMAL HIGH (ref 70–99)

## 2018-10-05 MED ORDER — INSULIN GLARGINE 100 UNIT/ML ~~LOC~~ SOLN: 15.0000 [IU] | Freq: Every day | SUBCUTANEOUS | 11 refills | Status: AC

## 2018-10-05 NOTE — Discharge Summary (Signed)
Kenneth [name]:[id number] DOB: 1966-02-22 DOA: 09/29/2018   Patient coming from: Homeless  Chief Complaint: Not feeling well  HPI: Kenneth [name] is a 53 y.o. male with medical history significant for uncontrolled diabetes mellitus, tobacco abuse, polysubstance abuse, hypertension, GERD presented to the ED complaining of generalized pain, nausea, lethargy for the past couple of days.  Patient also noted significant polyuria and polydipsia in the past few days.  Patient denies any significant abdominal pain, vomiting, chest pain, cough, runny nose, sore throat, denies any fever/chills.  Of note, patient is homeless, and does not remember the last time he took his medications including his insulin. On presentation, vital signs noted for some tachycardia, otherwise unremarkable.  Labs notable for BG 757, VBG showed metabolic acidosis, with anion gap, hyponatremia, mild hyperkalemia, AKI, UA showed greater than 500 glucose with ketones, UDS positive for cocaine.  Chest x-ray unremarkable.  Patient noted to be in DKA, received IV fluid boluses, insulin drip started in the ED. [facility] hospitalist consulted to admit patient for further management.  Patient admitted as above with marked hyperglycemia, noncompliant with insulin for upwards of a week per discussion, patient's anion gap is now closed, DKA resolving, tolerating p.o. well at this point patient otherwise medically stable and agreeable for discharge. Unfortunately patient is COVID positive and given his homeless status disposition remains difficult.  Patient remains medically stable and appropriate for discharge - pending hotel/disoposition availability w/ Case/Social and health department assistance.  Review of Systems: Review of systems are otherwise negative  Past Medical History:  Diagnosis Date  . Arthritis   . Chronic generalized pain   . Diabetes mellitus   . DKA (diabetic ketoacidosis) ([place]) 07/2014  . Malnutrition ([place])   .  Neuropathy   . Noncompliance with medication regimen   . Thyroid disease    History reviewed. No pertinent surgical history.  Social History:  reports that he has been smoking. He has a 4.00 pack-year smoking history. He has never used smokeless tobacco. He reports current drug use. Drug: Cocaine. He reports that he does not drink alcohol.  Allergies  Allergen Reactions  . Metformin And Related Other (See Comments)    Abdominal cramps and diarrhea  . Nsaids Nausea Only   Family History  Problem Relation Age of Onset  . Diabetes Mother   . Seizures Sister   . Diabetes Sister    Physical Exam: BP 110/73 (BP Location: Right Arm)   Pulse 98   Temp 97.7 F (36.5 C) (Oral)   Resp 18   Ht 5\' 9"  (1.753 m)   Wt 57.6 kg   SpO2 96%   BMI 18.75 kg/m   General:  Pleasantly resting in bed, No acute distress. HEENT:  Normocephalic atraumatic. Neck:  Without mass or deformity.  Trachea is midline. Heart:  Regular rate and rhythm. Extremities: Without cyanosis, clubbing, edema, or obvious deformity.          Labs on Admission:  Basic Metabolic Panel: Recent Labs  Lab 09/29/18 1233 09/29/18 1614 09/29/18 1827 09/29/18 2223 09/30/18 0139  NA 122* 130* 131* 131* 129*  K 5.6* 3.9 4.0 3.7 3.8  CL 84* 99 100 100 97*  CO2 11* 14* 16* 20* 18*  GLUCOSE 757* 351* 235* 121* 293*  BUN 29* 25* 23* 19 19  CREATININE 1.57* 1.25* 1.07 0.86 0.96  CALCIUM 9.0 8.1* 8.4* 8.1* 7.9*   Liver Function Tests: No results for input(s): AST, ALT, ALKPHOS, BILITOT, PROT, ALBUMIN in the  last 168 hours. No results for input(s): LIPASE, AMYLASE in the last 168 hours. No results for input(s): AMMONIA in the last 168 hours. CBC: Recent Labs  Lab 09/29/18 1233  WBC 7.6  HGB 14.9  HCT 44.8  MCV 97.2  PLT 212   Cardiac Enzymes: No results for input(s): CKTOTAL, CKMB, CKMBINDEX, TROPONINI in the last 168 hours.  BNP (last 3 results) No results for input(s): BNP in the last 8760 hours.  ProBNP  (last 3 results) No results for input(s): PROBNP in the last 8760 hours.  CBG: Recent Labs  Lab 10/04/18 1132 10/04/18 1552 10/04/18 1950 10/04/18 2359 10/05/18 0335  GLUCAP 266* 241* 291* 198* 117*    Radiological Exams on Admission: No results found.  EKG: Independently reviewed.  Normal sinus rhythm, no acute ST changes  Assessment/Plan Present on Admission: . DKA (diabetic ketoacidoses) (HCC) . MDD (major depressive disorder), recurrent severe, without psychosis (HCC) . Tobacco abuse . Hyperthyroidism . Cocaine abuse (HCC)  Principal Problem:   DKA (diabetic ketoacidoses) (HCC) Active Problems:   Hyperthyroidism   Diabetes mellitus (HCC)   MDD (major depressive disorder), recurrent severe, without psychosis (HCC)   Cocaine abuse (HCC)   Tobacco abuse   DKA with uncontrolled diabetes mellitus, POA, resolved Last A1c 06/06/2018 was 12.7 Marked medical noncompliance VBG showed metabolic acidosis with anion gap BG 757 on presentation, UA with ketones, glucose greater than 500 Afebrile, no leukocytosis Gap closed, patient not tolerating p.o. symptoms resolved on lantus 10u HS and sliding scale with improved diet.  Pseudo-hyponatremia, resolved Secondary to above  AKI, resolved Secondary to above, Now tolerating p.o.  Hyperthyroidism TSH and free T4 within normal limits  Hypertension Stable, Hold home lisinopril due to AKI  GERD Continue PPI  Polysubstance/tobacco abuse Advised to quit, Nicotine patch ordered  DVT prophylaxis: Heparin  Code Status: Full  Family Communication: None at bedside  Disposition Plan: Patient resolved much more drastically than expected, DKA resolved, nausea vomiting resolved, patient is not hypoxic. Discharge as above to hotel once location confirmed.   Consults called: None  Azucena FallenWilliam C Lancaster MD [facility]  If 7PM-7AM, please contact night-coverage [email or web address]  10/05/2018, 7:33 AM

## 2018-10-05 NOTE — Progress Notes (Signed)
CSW reached out to health dept for update on process for [facility], they reported they had already sent transportation to pick patient up.   CSW confirmed with nurse patient was picked up by health dept to be transferred to [place].  CSW requested from Health Dept a call to inform of when transportation is set up in the future to ensure nurse and patient are prepared for discharge.   [place], [place]

## 2018-10-06 NOTE — Progress Notes (Signed)
CSW received call from pharmacy that patient did not go with his meds as planned. CSW called Felicia at health dept who reports patient has a new cell phone number of [phone]. CSW gave this number to pharmacy who will coordinate with patient on delivering meds.   CSW followed up with [facility] to provide new cell number for patient. CSW also called patient and gave him [facility] number to call at [facility] when he leaves the hotel to see what beds are avail. Link Snuffer can be reached at [phone] ext [phone].   [place], [place]

## 2018-10-06 NOTE — Care Plan (Addendum)
  Lengthy discussion with pharmacist today, [place], about this patient.  Patient was discharged yesterday on insulin vials due to cost issues with the patient availability from our pharmacy.  Calls the pharmacy back today indicating that he cannot use the insulin vials due to "blindness" which was certainly not apparent on his hospital stay just yesterday.  Patient was able to ambulate the hallway yesterday with no difficulty and signed paperwork again with no difficulty.  Time we have offered to change the patient's insulin vials to pens provide pen needles to the patient.  Unfortunately the patient declined any further medication assistance from [place] stating "If I die, then I die... Im through with you" per pharmacist discussion.  Patient has a very lengthy history of narcotic use, patient exhibited drug-seeking behavior while in the hospital, attempting to barter medication compliance of his insulin with narcotic administration which was completely unreasonable.  Patient again over the phone today with pharmacist indicated that without further prescriptions of gabapentin or narcotics the patient would not use insulin.  Patient is an extremely high risk for readmission given insulin noncompliance,and medication noncompliance in general. I fully suspect that should the patient continue to be medically noncompliant he will be back in the ED within 48-72 hours given his non-compliance with diet and medications - likely in DKA which is unfortunately consistent with his history as can be seen by his multiple admission and ED visits in just the last 4 months.

## 2018-11-21 DEATH — deceased

## 2020-07-15 IMAGING — CR DG CHEST 1V PORT
1 series · 1 of 1 positions shown · non-contrast
Comparison: Chest radiograph from one day prior.

CLINICAL DATA: Severe sepsis

EXAM:
PORTABLE CHEST 1 VIEW

[portable]
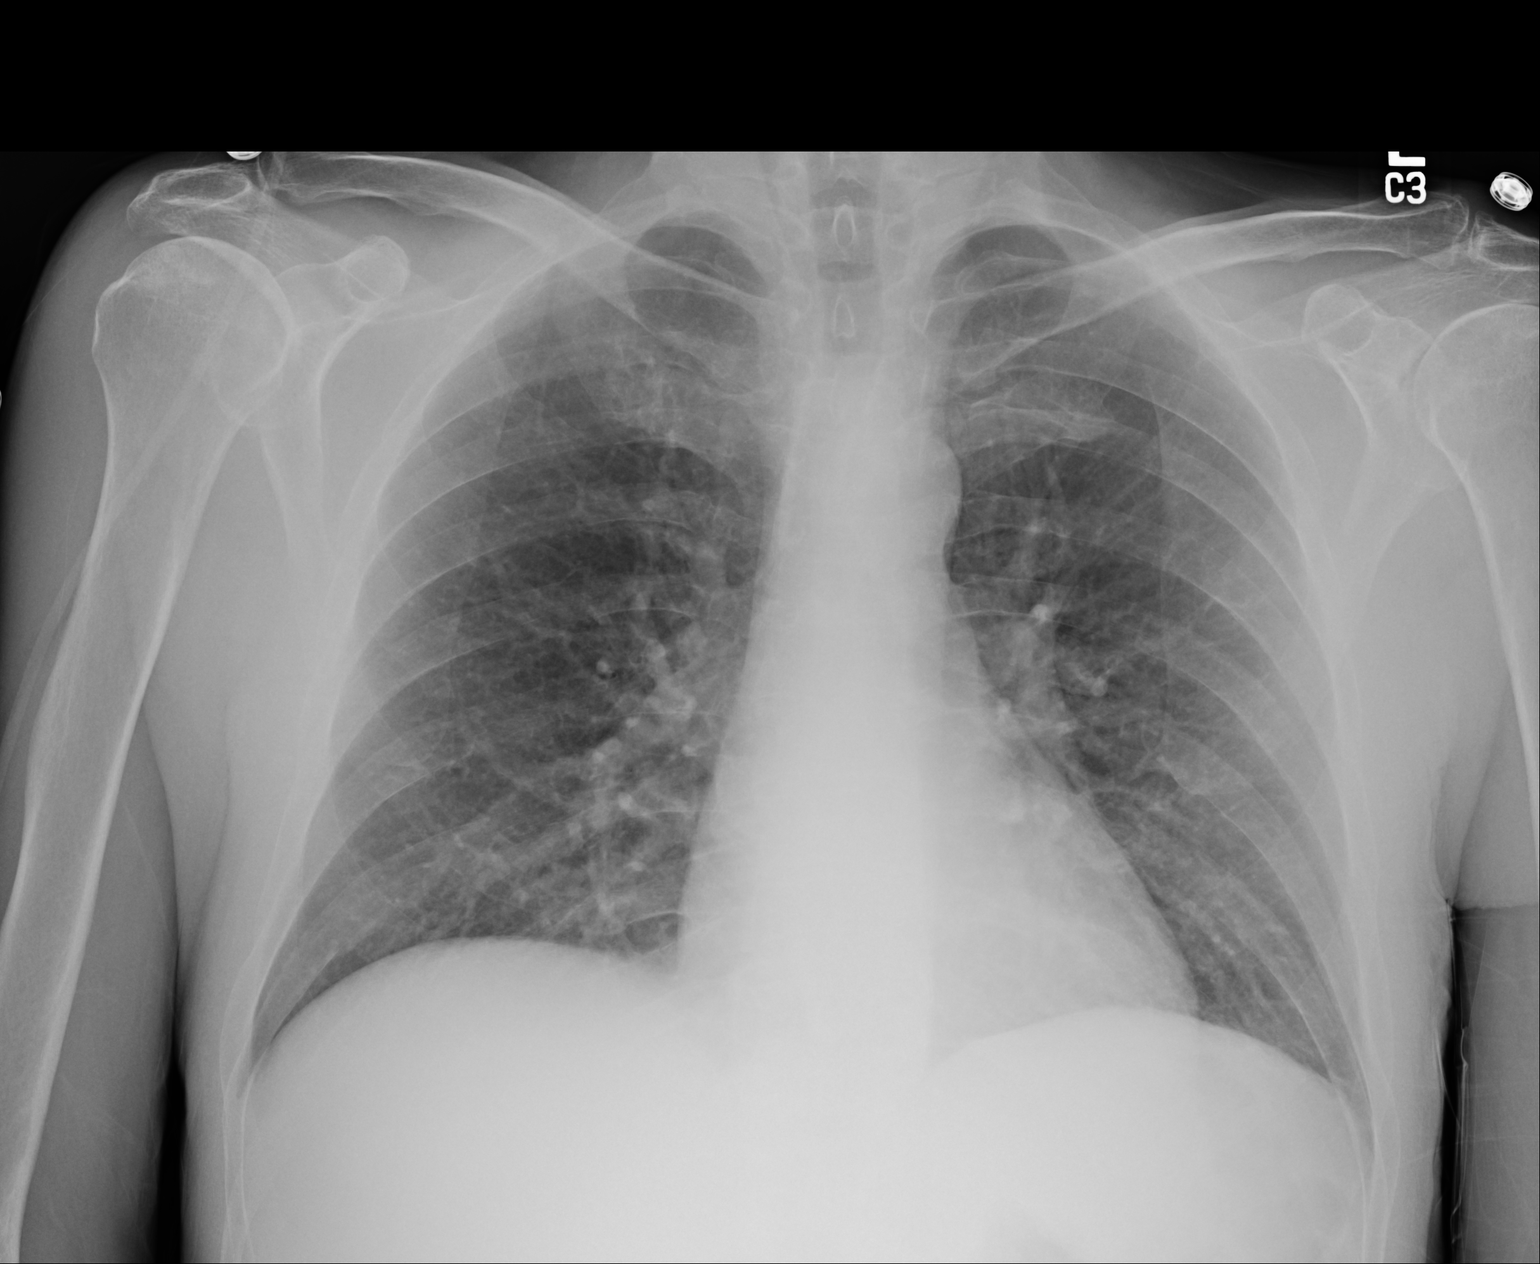

[1 of 1 positions shown; findings below may reference images not displayed]

FINDINGS: Stable cardiomediastinal silhouette with normal heart size. No
pneumothorax. No pleural effusion. Lungs appear clear, with no acute
consolidative airspace disease and no pulmonary edema.
IMPRESSION: No active disease.
